# Patient Record
Sex: Female | Born: 1950 | Race: White | Hispanic: No | Marital: Married | Smoking: Former smoker
Health system: Southern US, Community
[De-identification: ages and names within clinical notes are randomized; demographics above are authoritative.]

## PROBLEM LIST (undated history)

## (undated) ENCOUNTER — Emergency Department: Admission: EM | Discharge status: Absent without leave | Payer: PRIVATE HEALTH INSURANCE

## (undated) DIAGNOSIS — M797 Fibromyalgia: Secondary | ICD-10-CM

## (undated) DIAGNOSIS — N183 Chronic kidney disease, stage 3 unspecified: Secondary | ICD-10-CM

## (undated) DIAGNOSIS — R161 Splenomegaly, not elsewhere classified: Secondary | ICD-10-CM

## (undated) DIAGNOSIS — I10 Essential (primary) hypertension: Secondary | ICD-10-CM

## (undated) DIAGNOSIS — G8929 Other chronic pain: Secondary | ICD-10-CM

## (undated) DIAGNOSIS — D869 Sarcoidosis, unspecified: Secondary | ICD-10-CM

## (undated) DIAGNOSIS — D696 Thrombocytopenia, unspecified: Secondary | ICD-10-CM

## (undated) DIAGNOSIS — F329 Major depressive disorder, single episode, unspecified: Secondary | ICD-10-CM

## (undated) DIAGNOSIS — M1990 Unspecified osteoarthritis, unspecified site: Secondary | ICD-10-CM

## (undated) DIAGNOSIS — M5020 Other cervical disc displacement, unspecified cervical region: Secondary | ICD-10-CM

## (undated) DIAGNOSIS — E119 Type 2 diabetes mellitus without complications: Secondary | ICD-10-CM

## (undated) DIAGNOSIS — M47812 Spondylosis without myelopathy or radiculopathy, cervical region: Secondary | ICD-10-CM

## (undated) DIAGNOSIS — K219 Gastro-esophageal reflux disease without esophagitis: Secondary | ICD-10-CM

## (undated) DIAGNOSIS — F32A Depression, unspecified: Secondary | ICD-10-CM

## (undated) DIAGNOSIS — K8020 Calculus of gallbladder without cholecystitis without obstruction: Secondary | ICD-10-CM

## (undated) DIAGNOSIS — G894 Chronic pain syndrome: Secondary | ICD-10-CM

## (undated) DIAGNOSIS — K7460 Unspecified cirrhosis of liver: Secondary | ICD-10-CM

## (undated) HISTORY — DX: Unspecified osteoarthritis, unspecified site: M19.90

## (undated) HISTORY — DX: Chronic kidney disease, stage 3 (moderate): N18.3

## (undated) HISTORY — PX: KNEE ARTHROSCOPY: SUR90

## (undated) HISTORY — PX: FRACTURE SURGERY: SHX138

## (undated) HISTORY — PX: OTHER SURGICAL HISTORY: SHX169

## (undated) HISTORY — PX: CYST EXCISION: SHX5701

## (undated) HISTORY — PX: BREAST SURGERY: SHX581

## (undated) HISTORY — DX: Other cervical disc displacement, unspecified cervical region: M50.20

## (undated) HISTORY — DX: Chronic kidney disease, stage 3 unspecified: N18.30

## (undated) HISTORY — DX: Splenomegaly, not elsewhere classified: R16.1

## (undated) HISTORY — PX: HAND SURGERY: SHX662

## (undated) HISTORY — DX: Spondylosis without myelopathy or radiculopathy, cervical region: M47.812

## (undated) HISTORY — DX: Thrombocytopenia, unspecified: D69.6

## (undated) HISTORY — DX: Fibromyalgia: M79.7

## (undated) HISTORY — DX: Unspecified cirrhosis of liver: K74.60

## (undated) HISTORY — DX: Calculus of gallbladder without cholecystitis without obstruction: K80.20

## (undated) HISTORY — PX: BACK SURGERY: SHX140

## (undated) HISTORY — PX: ABDOMINAL HYSTERECTOMY: SHX81

## (undated) HISTORY — DX: Chronic pain syndrome: G89.4

---

## 2004-01-30 ENCOUNTER — Other Ambulatory Visit: Payer: Self-pay

## 2011-08-25 DIAGNOSIS — E785 Hyperlipidemia, unspecified: Secondary | ICD-10-CM | POA: Insufficient documentation

## 2011-08-25 DIAGNOSIS — D869 Sarcoidosis, unspecified: Secondary | ICD-10-CM | POA: Insufficient documentation

## 2011-11-19 ENCOUNTER — Emergency Department: Payer: Self-pay | Admitting: Internal Medicine

## 2012-10-12 ENCOUNTER — Emergency Department: Payer: Self-pay | Admitting: Emergency Medicine

## 2013-02-17 DIAGNOSIS — M810 Age-related osteoporosis without current pathological fracture: Secondary | ICD-10-CM | POA: Insufficient documentation

## 2013-08-04 DIAGNOSIS — E559 Vitamin D deficiency, unspecified: Secondary | ICD-10-CM | POA: Insufficient documentation

## 2013-08-04 DIAGNOSIS — M797 Fibromyalgia: Secondary | ICD-10-CM | POA: Insufficient documentation

## 2013-08-28 DIAGNOSIS — G894 Chronic pain syndrome: Secondary | ICD-10-CM

## 2013-08-28 HISTORY — DX: Chronic pain syndrome: G89.4

## 2013-10-19 ENCOUNTER — Ambulatory Visit: Payer: Self-pay | Admitting: Pain Medicine

## 2013-10-19 LAB — BASIC METABOLIC PANEL
Anion Gap: 3 — ABNORMAL LOW (ref 7–16)
BUN: 18 mg/dL (ref 7–18)
Chloride: 99 mmol/L (ref 98–107)
Co2: 31 mmol/L (ref 21–32)
Creatinine: 1.27 mg/dL (ref 0.60–1.30)
EGFR (Non-African Amer.): 46 — ABNORMAL LOW
Osmolality: 272 (ref 275–301)

## 2013-10-19 LAB — SEDIMENTATION RATE: Erythrocyte Sed Rate: 34 mm/hr — ABNORMAL HIGH (ref 0–30)

## 2013-11-14 ENCOUNTER — Ambulatory Visit: Payer: Self-pay | Admitting: Pain Medicine

## 2013-11-14 ENCOUNTER — Other Ambulatory Visit: Payer: Self-pay | Admitting: Pain Medicine

## 2013-11-25 ENCOUNTER — Ambulatory Visit: Payer: Self-pay | Admitting: Pain Medicine

## 2013-12-13 ENCOUNTER — Ambulatory Visit: Payer: Self-pay | Admitting: Pain Medicine

## 2013-12-26 ENCOUNTER — Ambulatory Visit: Payer: Self-pay | Admitting: Pain Medicine

## 2014-03-31 ENCOUNTER — Ambulatory Visit: Payer: Self-pay | Admitting: Pain Medicine

## 2014-04-04 ENCOUNTER — Ambulatory Visit: Payer: Self-pay | Admitting: Pain Medicine

## 2014-05-17 ENCOUNTER — Ambulatory Visit: Payer: Self-pay | Admitting: Pain Medicine

## 2014-06-12 DIAGNOSIS — N281 Cyst of kidney, acquired: Secondary | ICD-10-CM | POA: Insufficient documentation

## 2014-07-11 DIAGNOSIS — K824 Cholesterolosis of gallbladder: Secondary | ICD-10-CM | POA: Insufficient documentation

## 2014-09-29 ENCOUNTER — Ambulatory Visit: Payer: Self-pay | Admitting: Pain Medicine

## 2015-06-20 DIAGNOSIS — I10 Essential (primary) hypertension: Secondary | ICD-10-CM | POA: Insufficient documentation

## 2015-06-20 DIAGNOSIS — M4322 Fusion of spine, cervical region: Secondary | ICD-10-CM | POA: Insufficient documentation

## 2015-07-31 DIAGNOSIS — F514 Sleep terrors [night terrors]: Secondary | ICD-10-CM | POA: Insufficient documentation

## 2015-07-31 DIAGNOSIS — R1013 Epigastric pain: Secondary | ICD-10-CM | POA: Insufficient documentation

## 2015-08-14 ENCOUNTER — Emergency Department: Payer: No Typology Code available for payment source

## 2015-08-14 ENCOUNTER — Emergency Department
Admission: EM | Admit: 2015-08-14 | Discharge: 2015-08-14 | Disposition: A | Discharge status: Home / Self Care | Payer: No Typology Code available for payment source | Attending: Emergency Medicine | Admitting: Emergency Medicine

## 2015-08-14 ENCOUNTER — Encounter: Payer: Self-pay | Admitting: Emergency Medicine

## 2015-08-14 DIAGNOSIS — R202 Paresthesia of skin: Secondary | ICD-10-CM | POA: Diagnosis not present

## 2015-08-14 DIAGNOSIS — Y92410 Unspecified street and highway as the place of occurrence of the external cause: Secondary | ICD-10-CM | POA: Insufficient documentation

## 2015-08-14 DIAGNOSIS — S134XXA Sprain of ligaments of cervical spine, initial encounter: Secondary | ICD-10-CM | POA: Diagnosis not present

## 2015-08-14 DIAGNOSIS — I10 Essential (primary) hypertension: Secondary | ICD-10-CM | POA: Diagnosis not present

## 2015-08-14 DIAGNOSIS — Y998 Other external cause status: Secondary | ICD-10-CM | POA: Insufficient documentation

## 2015-08-14 DIAGNOSIS — S139XXA Sprain of joints and ligaments of unspecified parts of neck, initial encounter: Secondary | ICD-10-CM

## 2015-08-14 DIAGNOSIS — Y9389 Activity, other specified: Secondary | ICD-10-CM | POA: Diagnosis not present

## 2015-08-14 DIAGNOSIS — S199XXA Unspecified injury of neck, initial encounter: Secondary | ICD-10-CM | POA: Diagnosis present

## 2015-08-14 DIAGNOSIS — E119 Type 2 diabetes mellitus without complications: Secondary | ICD-10-CM | POA: Diagnosis not present

## 2015-08-14 HISTORY — DX: Major depressive disorder, single episode, unspecified: F32.9

## 2015-08-14 HISTORY — DX: Essential (primary) hypertension: I10

## 2015-08-14 HISTORY — DX: Type 2 diabetes mellitus without complications: E11.9

## 2015-08-14 HISTORY — DX: Gastro-esophageal reflux disease without esophagitis: K21.9

## 2015-08-14 HISTORY — DX: Sarcoidosis, unspecified: D86.9

## 2015-08-14 HISTORY — DX: Depression, unspecified: F32.A

## 2015-08-14 HISTORY — DX: Other chronic pain: G89.29

## 2015-08-14 MED ORDER — HYDROCOD POLST-CPM POLST ER 10-8 MG/5ML PO SUER: 5.0000 mL | Freq: Two times a day (BID) | ORAL | Status: DC

## 2015-08-14 MED ORDER — DIAZEPAM 5 MG PO TABS
10.0000 mg | ORAL_TABLET | Freq: Once | ORAL | Status: AC
  Administered 2015-08-14: 10 mg via ORAL
  Filled 2015-08-14: qty 2

## 2015-08-14 MED ORDER — IBUPROFEN 800 MG PO TABS: 800.0000 mg | ORAL_TABLET | Freq: Three times a day (TID) | ORAL | Status: DC | PRN

## 2015-08-14 MED ORDER — DIAZEPAM 5 MG PO TABS: 5.0000 mg | ORAL_TABLET | Freq: Three times a day (TID) | ORAL | Status: DC | PRN

## 2015-08-14 NOTE — Discharge Instructions (Signed)
Cervical Sprain A cervical sprain is when the tissues (ligaments) that hold the neck bones in place stretch or tear. HOME CARE   Put ice on the injured area.  Put ice in a plastic bag.  Place a towel between your skin and the bag.  Leave the ice on for 15-20 minutes, 3-4 times a day.  You may have been given a collar to wear. This collar keeps your neck from moving while you heal.  Do not take the collar off unless told by your doctor.  If you have long hair, keep it outside of the collar.  Ask your doctor before changing the position of your collar. You may need to change its position over time to make it more comfortable.  If you are allowed to take off the collar for cleaning or bathing, follow your doctor's instructions on how to do it safely.  Keep your collar clean by wiping it with mild soap and water. Dry it completely. If the collar has removable pads, remove them every 1-2 days to hand wash them with soap and water. Allow them to air dry. They should be dry before you wear them in the collar.  Do not drive while wearing the collar.  Only take medicine as told by your doctor.  Keep all doctor visits as told.  Keep all physical therapy visits as told.  Adjust your work station so that you have good posture while you work.  Avoid positions and activities that make your problems worse.  Warm up and stretch before being active. GET HELP IF:  Your pain is not controlled with medicine.  You cannot take less pain medicine over time as planned.  Your activity level does not improve as expected. GET HELP RIGHT AWAY IF:   You are bleeding.  Your stomach is upset.  You have an allergic reaction to your medicine.  You develop new problems that you cannot explain.  You lose feeling (become numb) or you cannot move any part of your body (paralysis).  You have tingling or weakness in any part of your body.  Your symptoms get worse. Symptoms include:  Pain,  soreness, stiffness, puffiness (swelling), or a burning feeling in your neck.  Pain when your neck is touched.  Shoulder or upper back pain.  Limited ability to move your neck.  Headache.  Dizziness.  Your hands or arms feel week, lose feeling, or tingle.  Muscle spasms.  Difficulty swallowing or chewing. MAKE SURE YOU:   Understand these instructions.  Will watch your condition.  Will get help right away if you are not doing well or get worse. Document Released: 05/05/2008 Document Revised: 07/20/2013 Document Reviewed: 05/25/2013 Exit[employer] Patient Information 2015 [employer]. This information is not intended to replace advice given to you by your health care provider. Make sure you discuss any questions you have with your health care provider.  
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
## 2015-08-27 ENCOUNTER — Other Ambulatory Visit: Payer: Self-pay | Admitting: Neurosurgery

## 2015-08-27 ENCOUNTER — Ambulatory Visit
Admission: RE | Admit: 2015-08-27 | Discharge: 2015-08-27 | Disposition: A | Discharge status: Home / Self Care | Payer: No Typology Code available for payment source | Source: Ambulatory Visit | Attending: Neurosurgery | Admitting: Neurosurgery

## 2015-08-27 ENCOUNTER — Ambulatory Visit
Admission: RE | Admit: 2015-08-27 | Discharge: 2015-08-27 | Disposition: A | Discharge status: Home / Self Care | Payer: Self-pay | Source: Ambulatory Visit | Attending: Neurosurgery | Admitting: Neurosurgery

## 2015-08-27 DIAGNOSIS — M542 Cervicalgia: Secondary | ICD-10-CM

## 2015-08-27 DIAGNOSIS — Z981 Arthrodesis status: Secondary | ICD-10-CM | POA: Insufficient documentation

## 2015-08-27 DIAGNOSIS — M4312 Spondylolisthesis, cervical region: Secondary | ICD-10-CM | POA: Insufficient documentation

## 2015-09-04 ENCOUNTER — Ambulatory Visit: Payer: Medicare Other | Attending: Pain Medicine | Admitting: Pain Medicine

## 2015-09-04 ENCOUNTER — Encounter: Payer: Self-pay | Admitting: Pain Medicine

## 2015-09-04 VITALS — BP 106/71 | HR 83 | Temp 97.7°F | Resp 18 | Ht 63.0 in | Wt 210.0 lb

## 2015-09-04 DIAGNOSIS — M25519 Pain in unspecified shoulder: Secondary | ICD-10-CM

## 2015-09-04 DIAGNOSIS — M549 Dorsalgia, unspecified: Secondary | ICD-10-CM

## 2015-09-04 DIAGNOSIS — M542 Cervicalgia: Secondary | ICD-10-CM

## 2015-09-04 DIAGNOSIS — E785 Hyperlipidemia, unspecified: Secondary | ICD-10-CM | POA: Insufficient documentation

## 2015-09-04 DIAGNOSIS — E119 Type 2 diabetes mellitus without complications: Secondary | ICD-10-CM | POA: Diagnosis not present

## 2015-09-04 DIAGNOSIS — M479 Spondylosis, unspecified: Secondary | ICD-10-CM

## 2015-09-04 DIAGNOSIS — Z9181 History of falling: Secondary | ICD-10-CM

## 2015-09-04 DIAGNOSIS — Z87820 Personal history of traumatic brain injury: Secondary | ICD-10-CM

## 2015-09-04 DIAGNOSIS — M5020 Other cervical disc displacement, unspecified cervical region: Secondary | ICD-10-CM | POA: Insufficient documentation

## 2015-09-04 DIAGNOSIS — I129 Hypertensive chronic kidney disease with stage 1 through stage 4 chronic kidney disease, or unspecified chronic kidney disease: Secondary | ICD-10-CM | POA: Insufficient documentation

## 2015-09-04 DIAGNOSIS — Z981 Arthrodesis status: Secondary | ICD-10-CM | POA: Diagnosis not present

## 2015-09-04 DIAGNOSIS — M47816 Spondylosis without myelopathy or radiculopathy, lumbar region: Secondary | ICD-10-CM | POA: Diagnosis present

## 2015-09-04 DIAGNOSIS — M797 Fibromyalgia: Secondary | ICD-10-CM | POA: Diagnosis not present

## 2015-09-04 DIAGNOSIS — M47812 Spondylosis without myelopathy or radiculopathy, cervical region: Secondary | ICD-10-CM

## 2015-09-04 DIAGNOSIS — M545 Low back pain: Secondary | ICD-10-CM

## 2015-09-04 DIAGNOSIS — G8929 Other chronic pain: Secondary | ICD-10-CM

## 2015-09-04 DIAGNOSIS — F1120 Opioid dependence, uncomplicated: Secondary | ICD-10-CM

## 2015-09-04 DIAGNOSIS — G894 Chronic pain syndrome: Secondary | ICD-10-CM | POA: Diagnosis not present

## 2015-09-04 DIAGNOSIS — M5459 Other low back pain: Secondary | ICD-10-CM

## 2015-09-04 DIAGNOSIS — R2689 Other abnormalities of gait and mobility: Secondary | ICD-10-CM

## 2015-09-04 DIAGNOSIS — R51 Headache: Secondary | ICD-10-CM

## 2015-09-04 DIAGNOSIS — J449 Chronic obstructive pulmonary disease, unspecified: Secondary | ICD-10-CM | POA: Diagnosis not present

## 2015-09-04 DIAGNOSIS — F1190 Opioid use, unspecified, uncomplicated: Secondary | ICD-10-CM

## 2015-09-04 DIAGNOSIS — G4486 Cervicogenic headache: Secondary | ICD-10-CM

## 2015-09-04 DIAGNOSIS — Z79891 Long term (current) use of opiate analgesic: Secondary | ICD-10-CM

## 2015-09-04 MED ORDER — FENTANYL CITRATE (PF) 100 MCG/2ML IJ SOLN
INTRAMUSCULAR | Status: AC
  Administered 2015-09-04: 50 ug via INTRAVENOUS
  Filled 2015-09-04: qty 2

## 2015-09-04 MED ORDER — TRIAMCINOLONE ACETONIDE 40 MG/ML IJ SUSP
INTRAMUSCULAR | Status: AC
Start: 2015-09-04 — End: 2015-09-04
  Administered 2015-09-04: 09:00:00
  Filled 2015-09-04: qty 1

## 2015-09-04 MED ORDER — ROPIVACAINE HCL 2 MG/ML IJ SOLN
INTRAMUSCULAR | Status: AC
  Administered 2015-09-04: 09:00:00
  Filled 2015-09-04: qty 10

## 2015-09-04 MED ORDER — MIDAZOLAM HCL 5 MG/5ML IJ SOLN
INTRAMUSCULAR | Status: AC
  Administered 2015-09-04: 3 mg via INTRAVENOUS
  Filled 2015-09-04: qty 5

## 2015-09-04 NOTE — Progress Notes (Signed)
Safety precautions to be maintained throughout the outpatient stay will include: orient to surroundings, keep bed in low position, maintain call bell within reach at all times, provide assistance with transfer out of bed and ambulation.  

## 2015-09-04 NOTE — Patient Instructions (Signed)

## 2015-09-05 ENCOUNTER — Telehealth: Payer: Self-pay | Admitting: *Deleted

## 2015-09-05 DIAGNOSIS — R51 Headache: Secondary | ICD-10-CM

## 2015-09-05 DIAGNOSIS — E119 Type 2 diabetes mellitus without complications: Secondary | ICD-10-CM | POA: Insufficient documentation

## 2015-09-05 DIAGNOSIS — M064 Inflammatory polyarthropathy: Secondary | ICD-10-CM | POA: Insufficient documentation

## 2015-09-05 DIAGNOSIS — Z87820 Personal history of traumatic brain injury: Secondary | ICD-10-CM | POA: Insufficient documentation

## 2015-09-05 DIAGNOSIS — I110 Hypertensive heart disease with heart failure: Secondary | ICD-10-CM | POA: Insufficient documentation

## 2015-09-05 DIAGNOSIS — N183 Chronic kidney disease, stage 3 unspecified: Secondary | ICD-10-CM | POA: Insufficient documentation

## 2015-09-05 DIAGNOSIS — G4486 Cervicogenic headache: Secondary | ICD-10-CM | POA: Insufficient documentation

## 2015-09-05 DIAGNOSIS — J449 Chronic obstructive pulmonary disease, unspecified: Secondary | ICD-10-CM | POA: Insufficient documentation

## 2015-09-05 DIAGNOSIS — Z9181 History of falling: Secondary | ICD-10-CM | POA: Insufficient documentation

## 2015-09-05 DIAGNOSIS — Z79891 Long term (current) use of opiate analgesic: Secondary | ICD-10-CM | POA: Insufficient documentation

## 2015-09-05 DIAGNOSIS — F329 Major depressive disorder, single episode, unspecified: Secondary | ICD-10-CM | POA: Insufficient documentation

## 2015-09-05 DIAGNOSIS — M549 Dorsalgia, unspecified: Secondary | ICD-10-CM

## 2015-09-05 DIAGNOSIS — R2689 Other abnormalities of gait and mobility: Secondary | ICD-10-CM | POA: Insufficient documentation

## 2015-09-05 DIAGNOSIS — M6281 Muscle weakness (generalized): Secondary | ICD-10-CM | POA: Insufficient documentation

## 2015-09-05 DIAGNOSIS — D696 Thrombocytopenia, unspecified: Secondary | ICD-10-CM | POA: Insufficient documentation

## 2015-09-05 DIAGNOSIS — L409 Psoriasis, unspecified: Secondary | ICD-10-CM | POA: Insufficient documentation

## 2015-09-05 DIAGNOSIS — M542 Cervicalgia: Secondary | ICD-10-CM | POA: Insufficient documentation

## 2015-09-05 DIAGNOSIS — M47812 Spondylosis without myelopathy or radiculopathy, cervical region: Secondary | ICD-10-CM | POA: Insufficient documentation

## 2015-09-05 DIAGNOSIS — M5020 Other cervical disc displacement, unspecified cervical region: Secondary | ICD-10-CM

## 2015-09-05 DIAGNOSIS — G8929 Other chronic pain: Secondary | ICD-10-CM

## 2015-09-05 DIAGNOSIS — F1190 Opioid use, unspecified, uncomplicated: Secondary | ICD-10-CM | POA: Insufficient documentation

## 2015-09-05 DIAGNOSIS — F32A Depression, unspecified: Secondary | ICD-10-CM | POA: Insufficient documentation

## 2015-09-05 DIAGNOSIS — K219 Gastro-esophageal reflux disease without esophagitis: Secondary | ICD-10-CM | POA: Insufficient documentation

## 2015-09-05 HISTORY — DX: Other cervical disc displacement, unspecified cervical region: M50.20

## 2015-09-05 NOTE — Progress Notes (Signed)
Patient's Name: Amber Acosta MRN: [id number] DOB: December 17, 1950 DOS: 09/04/2015 Primary Care Physician: Sharyne Peach, MD Location: [facility] Outpatient Pain Management Facility Note by: Kathlen Brunswick Dossie Arbour, M.D, DABA, DABAPM, DABPM, DABIPP, FIPP  Procedure(s):  1. Medial Branch Block (see levels below) 2. Fluoroscopic Guidance for Needle Placement. 3. Moderate (Conscious) Sedation. Side to be done: Right-sided Level(s): L2, L3, L4, L5, and S1 Medial Branch Nerve(s). Diagnostic Indications: Lumbar spondylosis, posterior spinal axial pain with referred pain to the extremity, secondary to a Facet Syndrome.  Primary Reason(s) for Visit: Amber Acosta is coming in today for a diagnostic Lumbar Medial Branch Block (Facet joint Block). CC: Back Pain  Note:   HPI: Amber Acosta is a 64 y.o. year old,  female patient, seen today for interventional management of her pain. She has Cervical vertebral fusion; Back pain, chronic; Chronic pain associated with significant psychosocial dysfunction; Fibromyalgia; Pain in shoulder; Lumbar facet joint pain; Facet syndrome, lumbar; Chronic kidney disease; Chronic obstructive pulmonary disease ([place]); Clinical depression; Type 2 diabetes mellitus ([place]); Hypertensive heart disease with congestive heart failure ([place]); Displacement of cervical intervertebral disc without myelopathy; Essential (primary) hypertension; Gall bladder polyp; Acid reflux; HLD (hyperlipidemia); Inflammatory polyarthropathy ([place]); Sleep terror; Adiposity; Arthritis, degenerative; OP (osteoporosis); Psoriasis; Kidney cysts; Muscle weakness (generalized); Sarcoidosis ([place]); Thrombocytopenia ([place]); Avitaminosis D; Chronic low back pain; Chronic neck pain; Cervicogenic headache; Cervical facet syndrome; Chronic shoulder pain; At risk for falls; Balance problems; Chronic pain syndrome; History of closed head injury; Opiate use; Uncomplicated opioid dependence ([place]); and Long term current use of opiate analgesic on her  problem list..Amber Acosta is allergic to mushroom extract complex; darvon; and minocycline.. The patient complains primarily of Back Pain     Initial Vitals:  Today's Vitals   09/04/15 0927 09/04/15 0937 09/04/15 0947 09/04/15 0958  BP: 96/75 105/55 113/58 106/71  Pulse: 88 92 78 83  Temp:      TempSrc:      Resp: $Remo'18 18 18 18  'HQycx$ Height:      Weight:      SpO2: 97% 99% 98% 98%  PainSc:       Pre-procedure Assessment:  A medical history and physical exam were obtained. Relevant documentation was reviewed and verified. Prior to the procedure, the patient was provided with information on the procedure, including side-effects, and possible complications. Under the influence of no sedatives, a verbal, as well as a written informed consent were obtained, after having provided information on the risks and possible complications. To fulfill our ethical and legal obligations, as recommended by the American Medical Association's Code of Ethics, we have provided information to the patient about our clinical impression; the nature and purpose of an available treatment or procedure; the risks and benefits of an available treatment or procedure; alternatives; the risk and benefits of the alternative treatment or procedure; and the risks and benefits of not receiving or undergoing a treatment or procedure. The patient was provided information about the risks and possible complications associated with the procedure. These include, but not limited to, failure to achieve desired goals, infection, bleeding, organ or nerve damage, allergic reactions, paralysis, and death. In addition, the patient was informed that Medicine is not an exact science; therefore, there is also the possibility of unforeseen risks and possible complications that may result in a catastrophic outcome. The patient indicated having understood very clearly.  We have given the patient no guarantees and we have made no promises. Ample time was given to  the patient to ask questions, all  of which were answered, to the patient's satisfaction, before proceeding. The patient understands that by signing our informed consent form, they understand and accept the risks and the fact that it is impossible to predict all possible complications. Baseline vital signs were taken and the medical assessment was completed. Verification of the correct person, correct site (including marking of site), and correct procedure were performed and confirmed by the patient. Safety Measures:  Allergies were reviewed. Appropriate site, procedure, and patient were confirmed by following the Joint Commission's Universal Protocol (UP.01.01.01), in the form of a "Time Out". The patient was asked to confirm marked site and procedure, before commencing. The patient was asked about blood thinners, or active infections, both of which were denied. No attempt was made at seeking any paresthesias during needle insertion. Aspiration looking for blood return was conducted prior to injecting. At no point did we inject any substances, as a needle was being advanced. Infection Control:  Standard Universal Precautions taken (Respiratory Hygiene/Cough Etiquette; Mouth, nose, eye protection; Hand Hygiene; Personal protective equipment (PPE); safe injection practices; and use of masks and disposable sterile surgical gloves) as recommended by the Department of [place] for Disease Control and Prevention (CDC). Monitoring:  During the procedure, the patient was monitored in the usual manner, using NIBPM, ECG, and pulse oxymetry. IV Access:  An IV access was obtained and secured. Sedation:  Moderate (Conscious) Intravenous sedation: Consent was obtained before administering any sedation. Availability of a responsible, adult driver, and NPO status confirmed. Meaningful verbal contact was maintained, with the patient at all times during the procedure. ASA Sedation Guidelines followed. For  specifics on pharmacological type and quantity of sedation, please see nursing chart. Prophylactic Antibiotics: None: No indications for antibiotic prophylaxis were identified. Position: Prone.  Area prepped: Lumbosacral region. Target area: Posterolateral area of the vertebral body. Medial Branch Nerve. Procedure Needle(s) used: 22-G, 3.5-inch, Quincke spinal needles. Solution injected: Local anesthetic: 0.2% Ropivacaine (5ml) Steroid: Triamcinolone 40 mg/ml (30ml) Volume: 0.5 ml/Level Medications administered today:  We administered ropivacaine (PF) 2 mg/ml (0.2%), triamcinolone acetonide, fentaNYL, and midazolam. Skin Infiltration of Local Anesthetic:  Lidocaine 1%. The skin and deeper tissues over the procedure site were infiltrated using a 3 cc Luer-loc syringe with a 0.5 inch, 25-G needle. Skin Antiseptic Prep and "Time-out": The procedure site was prepped using a broad-spectrum topical antiseptic. The area was then draped in the usual and standard manner. "Time-out" was performed as per JC Universal Protocol (UP.01.01.01). Prepping solution: DuraPrep Surgical Solution (Iodine Povacrylex [0.7% available iodine] and Isopropyl Alcohol, 74% w/w) Description of the procedure: The procedure needle was introduced through the skin, ipsilateral to the reported pain, and advanced to the target area. Employing the "Medial Branch Technique", the procedure needles were introduced through the skin and deeper tissues until the anatomical area of the Medial Branch was reached. Site confirmation and injection: Once satisfied with needle placement, I proceeded to slowly inject the desired solution, in incremental fashion, with intermittent negative aspiration, repeatedly questioning the patient about "ringing in the ears; funny metallic taste in the mouth; or pain on injection", making sure not to inject intraneural, intravascular, or to provoke any paresthesias. Immediate Post-operative disposition: The  patient tolerated the entire procedure well. A repeat set of vitals were taken after the procedure and the patient was kept under observation until discharge criteria was met. The patient was provided with discharge instructions, including a section on how to identify potential problems. Should any problems arise concerning this procedure, the  patient was given instructions to immediately contact us, at any time, without hesitation. In any case, we plan to contact the patient by telephone for a follow-up status report regarding this interventional procedure. Radiological Imaging: Fluoroscopic Guidance for Needle Placement:  I was personally present in the fluoroscopy suite during the entire procedure. The patient was positioned over the fluoroscopy table. The Fluoroscope was manipulated to obtain the best possible view of the target area. Parallax error was corrected before commencing the procedure. Once the target was reached, antero-posterior and lateral fluoroscopic views were taken to confirm needle placement in two planes. Fluoroscopy time: Please see the patient's chart for exact times on active fluoroscopy. Complications:  No heme, no CSF, no paresthesias. Discharge disposition:  Return to clinics in 2 weeks for post-procedure evaluation. Additional Comments/Plan:  None. Disclaimer: Medicine is not an Chief Strategy Officer. The only guarantee in medicine is that nothing is guaranteed. It is important to note that the decision to proceed with this intervention was based on the information collected from the patient. The Data and conclusions were drawn from the patient's questionnaire, the interview, and the physical examination. Because the information was provided in large part by the patient, it cannot be guaranteed that it has not been purposely or unconsciously manipulated. Every effort has been made to obtain as much relevant data as possible for this evaluation. It is important to note that the  conclusions that lead to this procedure are derived in large part from the available data. Always take into account that the treatment will also be dependent on availability of resources and existing treatment guidelines, considered by other Pain Management Practitioners as being common knowledge and practice, at this time. For Medico-Legal purposes, it is also important to point out that variations in procedural techniques and pharmacological choices are the acceptable norm. The indications, contraindications, technique, and results of the above procedure should only be interpreted and judged by a Board-Certified Interventional Pain Specialist with extensive familiarity and expertise in the same exact procedure and technique, doing otherwise would be inappropriate and unethical.

## 2015-09-05 NOTE — Telephone Encounter (Signed)
Verbalizes no complications

## 2015-09-21 ENCOUNTER — Encounter: Payer: Self-pay | Admitting: Pain Medicine

## 2015-09-21 ENCOUNTER — Ambulatory Visit: Payer: Medicare Other | Attending: Pain Medicine | Admitting: Pain Medicine

## 2015-09-21 VITALS — BP 164/67 | HR 71 | Resp 16 | Ht 63.5 in | Wt 217.0 lb

## 2015-09-21 DIAGNOSIS — E119 Type 2 diabetes mellitus without complications: Secondary | ICD-10-CM | POA: Insufficient documentation

## 2015-09-21 DIAGNOSIS — I129 Hypertensive chronic kidney disease with stage 1 through stage 4 chronic kidney disease, or unspecified chronic kidney disease: Secondary | ICD-10-CM | POA: Diagnosis not present

## 2015-09-21 DIAGNOSIS — G894 Chronic pain syndrome: Secondary | ICD-10-CM | POA: Insufficient documentation

## 2015-09-21 DIAGNOSIS — M47892 Other spondylosis, cervical region: Secondary | ICD-10-CM | POA: Diagnosis not present

## 2015-09-21 DIAGNOSIS — M4312 Spondylolisthesis, cervical region: Secondary | ICD-10-CM | POA: Insufficient documentation

## 2015-09-21 DIAGNOSIS — K219 Gastro-esophageal reflux disease without esophagitis: Secondary | ICD-10-CM | POA: Diagnosis not present

## 2015-09-21 DIAGNOSIS — J449 Chronic obstructive pulmonary disease, unspecified: Secondary | ICD-10-CM | POA: Diagnosis not present

## 2015-09-21 DIAGNOSIS — M797 Fibromyalgia: Secondary | ICD-10-CM | POA: Diagnosis not present

## 2015-09-21 DIAGNOSIS — Z5181 Encounter for therapeutic drug level monitoring: Secondary | ICD-10-CM | POA: Insufficient documentation

## 2015-09-21 DIAGNOSIS — F1990 Other psychoactive substance use, unspecified, uncomplicated: Secondary | ICD-10-CM

## 2015-09-21 DIAGNOSIS — M791 Myalgia: Secondary | ICD-10-CM

## 2015-09-21 DIAGNOSIS — F1120 Opioid dependence, uncomplicated: Secondary | ICD-10-CM

## 2015-09-21 DIAGNOSIS — Z79891 Long term (current) use of opiate analgesic: Secondary | ICD-10-CM | POA: Insufficient documentation

## 2015-09-21 DIAGNOSIS — I509 Heart failure, unspecified: Secondary | ICD-10-CM | POA: Insufficient documentation

## 2015-09-21 DIAGNOSIS — F1190 Opioid use, unspecified, uncomplicated: Secondary | ICD-10-CM | POA: Diagnosis not present

## 2015-09-21 DIAGNOSIS — R790 Abnormal level of blood mineral: Secondary | ICD-10-CM

## 2015-09-21 DIAGNOSIS — M47812 Spondylosis without myelopathy or radiculopathy, cervical region: Secondary | ICD-10-CM

## 2015-09-21 DIAGNOSIS — Z9889 Other specified postprocedural states: Secondary | ICD-10-CM | POA: Insufficient documentation

## 2015-09-21 DIAGNOSIS — E785 Hyperlipidemia, unspecified: Secondary | ICD-10-CM | POA: Insufficient documentation

## 2015-09-21 DIAGNOSIS — D869 Sarcoidosis, unspecified: Secondary | ICD-10-CM | POA: Diagnosis not present

## 2015-09-21 DIAGNOSIS — G4486 Cervicogenic headache: Secondary | ICD-10-CM

## 2015-09-21 DIAGNOSIS — M452 Ankylosing spondylitis of cervical region: Secondary | ICD-10-CM

## 2015-09-21 DIAGNOSIS — R51 Headache: Secondary | ICD-10-CM

## 2015-09-21 DIAGNOSIS — M4322 Fusion of spine, cervical region: Secondary | ICD-10-CM

## 2015-09-21 DIAGNOSIS — Z79899 Other long term (current) drug therapy: Secondary | ICD-10-CM

## 2015-09-21 DIAGNOSIS — M7918 Myalgia, other site: Secondary | ICD-10-CM | POA: Insufficient documentation

## 2015-09-21 DIAGNOSIS — M549 Dorsalgia, unspecified: Secondary | ICD-10-CM | POA: Diagnosis present

## 2015-09-21 HISTORY — DX: Spondylosis without myelopathy or radiculopathy, cervical region: M47.812

## 2015-09-21 MED ORDER — OXYCODONE HCL 5 MG PO TABS: 5.0000 mg | ORAL_TABLET | Freq: Three times a day (TID) | ORAL | Status: DC | PRN

## 2015-09-21 MED ORDER — PREGABALIN 150 MG PO CAPS: 150.0000 mg | ORAL_CAPSULE | Freq: Three times a day (TID) | ORAL | Status: DC

## 2015-09-21 MED ORDER — TIZANIDINE HCL 4 MG PO TABS: 4.0000 mg | ORAL_TABLET | Freq: Two times a day (BID) | ORAL | Status: DC | PRN

## 2015-09-21 MED ORDER — MAGNESIUM OXIDE 400 MG PO TABS: 400.0000 mg | ORAL_TABLET | Freq: Every day | ORAL | Status: DC
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
## 2015-10-18 ENCOUNTER — Other Ambulatory Visit: Payer: Self-pay | Admitting: Pain Medicine

## 2015-12-19 ENCOUNTER — Encounter: Payer: Self-pay | Admitting: Pain Medicine

## 2015-12-19 ENCOUNTER — Other Ambulatory Visit: Payer: Self-pay | Admitting: Pain Medicine

## 2015-12-19 ENCOUNTER — Ambulatory Visit: Payer: Medicare Other | Attending: Pain Medicine | Admitting: Pain Medicine

## 2015-12-19 VITALS — BP 134/88 | HR 75 | Temp 98.5°F | Resp 18 | Ht 63.0 in | Wt 223.0 lb

## 2015-12-19 DIAGNOSIS — D869 Sarcoidosis, unspecified: Secondary | ICD-10-CM | POA: Insufficient documentation

## 2015-12-19 DIAGNOSIS — E119 Type 2 diabetes mellitus without complications: Secondary | ICD-10-CM | POA: Diagnosis not present

## 2015-12-19 DIAGNOSIS — M549 Dorsalgia, unspecified: Secondary | ICD-10-CM | POA: Diagnosis present

## 2015-12-19 DIAGNOSIS — R790 Abnormal level of blood mineral: Secondary | ICD-10-CM

## 2015-12-19 DIAGNOSIS — Z5181 Encounter for therapeutic drug level monitoring: Secondary | ICD-10-CM | POA: Diagnosis not present

## 2015-12-19 DIAGNOSIS — M545 Low back pain, unspecified: Secondary | ICD-10-CM

## 2015-12-19 DIAGNOSIS — Z79891 Long term (current) use of opiate analgesic: Secondary | ICD-10-CM | POA: Diagnosis not present

## 2015-12-19 DIAGNOSIS — F1190 Opioid use, unspecified, uncomplicated: Secondary | ICD-10-CM | POA: Insufficient documentation

## 2015-12-19 DIAGNOSIS — L409 Psoriasis, unspecified: Secondary | ICD-10-CM | POA: Insufficient documentation

## 2015-12-19 DIAGNOSIS — E669 Obesity, unspecified: Secondary | ICD-10-CM | POA: Insufficient documentation

## 2015-12-19 DIAGNOSIS — G8929 Other chronic pain: Secondary | ICD-10-CM

## 2015-12-19 DIAGNOSIS — K824 Cholesterolosis of gallbladder: Secondary | ICD-10-CM | POA: Insufficient documentation

## 2015-12-19 DIAGNOSIS — I110 Hypertensive heart disease with heart failure: Secondary | ICD-10-CM | POA: Insufficient documentation

## 2015-12-19 DIAGNOSIS — G894 Chronic pain syndrome: Secondary | ICD-10-CM

## 2015-12-19 DIAGNOSIS — K219 Gastro-esophageal reflux disease without esophagitis: Secondary | ICD-10-CM | POA: Diagnosis not present

## 2015-12-19 DIAGNOSIS — M797 Fibromyalgia: Secondary | ICD-10-CM | POA: Diagnosis not present

## 2015-12-19 DIAGNOSIS — M810 Age-related osteoporosis without current pathological fracture: Secondary | ICD-10-CM | POA: Diagnosis not present

## 2015-12-19 DIAGNOSIS — E785 Hyperlipidemia, unspecified: Secondary | ICD-10-CM | POA: Diagnosis not present

## 2015-12-19 DIAGNOSIS — M4312 Spondylolisthesis, cervical region: Secondary | ICD-10-CM | POA: Insufficient documentation

## 2015-12-19 DIAGNOSIS — I129 Hypertensive chronic kidney disease with stage 1 through stage 4 chronic kidney disease, or unspecified chronic kidney disease: Secondary | ICD-10-CM | POA: Insufficient documentation

## 2015-12-19 DIAGNOSIS — M25559 Pain in unspecified hip: Secondary | ICD-10-CM | POA: Diagnosis present

## 2015-12-19 DIAGNOSIS — J449 Chronic obstructive pulmonary disease, unspecified: Secondary | ICD-10-CM | POA: Diagnosis not present

## 2015-12-19 DIAGNOSIS — M791 Myalgia: Secondary | ICD-10-CM

## 2015-12-19 DIAGNOSIS — M7918 Myalgia, other site: Secondary | ICD-10-CM

## 2015-12-19 DIAGNOSIS — M47816 Spondylosis without myelopathy or radiculopathy, lumbar region: Secondary | ICD-10-CM

## 2015-12-19 MED ORDER — OXYCODONE HCL 5 MG PO TABS: 5.0000 mg | ORAL_TABLET | Freq: Three times a day (TID) | ORAL | Status: DC | PRN

## 2015-12-19 MED ORDER — PREGABALIN 150 MG PO CAPS: 150.0000 mg | ORAL_CAPSULE | Freq: Three times a day (TID) | ORAL | Status: DC

## 2015-12-19 MED ORDER — MAGNESIUM OXIDE 400 MG PO TABS: 400.0000 mg | ORAL_TABLET | Freq: Every day | ORAL | Status: DC

## 2015-12-19 MED ORDER — TIZANIDINE HCL 4 MG PO TABS: 4.0000 mg | ORAL_TABLET | Freq: Two times a day (BID) | ORAL | Status: DC | PRN

## 2015-12-19 NOTE — Progress Notes (Signed)
Safety precautions to be maintained throughout the outpatient stay will include: orient to surroundings, keep bed in low position, maintain call bell within reach at all times, provide assistance with transfer out of bed and ambulation.  

## 2015-12-19 NOTE — Progress Notes (Signed)
Patient's Name: Amber Acosta MRN: [id number] DOB: Sep 16, 1951 DOS: 12/19/2015  Primary Reason(s) for Visit: Encounter for Medication Management CC: Back Pain and Hip Pain   HPI:  Amber Acosta is a 65 y.o. year old, female patient, who returns today as an established patient. 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); Displacement of cervical intervertebral disc without myelopathy; Essential (primary) hypertension; Gall bladder polyp; Acid reflux; HLD (hyperlipidemia); Inflammatory polyarthropathy (HCC); Sleep terror; Adiposity; Arthritis, degenerative; OP (osteoporosis); Psoriasis; Kidney cysts; Muscle weakness (generalized); Sarcoidosis (HCC); Thrombocytopenia (HCC); Avitaminosis D; Chronic low back pain (Location of Primary Source of Pain) (Bilateral) (R>L); Chronic neck pain; Cervicogenic headache; Cervical facet syndrome; Chronic shoulder pain; At risk for falls; Balance problems; Chronic pain syndrome; History of closed head injury; Opiate use; Uncomplicated opioid dependence (HCC); Long term current use of opiate analgesic; Encounter for therapeutic drug level monitoring; Long term prescription opiate use; Spondylolisthesis of cervical region; Cervical spondylosis without myelopathy; 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HCC); Hx of cervical spine surgery; Low magnesium levels; Myofascial pain; and Chronic pain on her problem list.. Her primarily concern today is the Back Pain and Hip Pain   The patient returns to the clinics today for pharmacological management of her chronic pain. At this point she is having recurrence of her low back pain. In the past we have  done diagnostic lumbar facet blocks with excellent results. We have currently done several of them and therefore today I have offered lumbar facet RFA. She is interested in knowing more about this. I have also offered her to have a standing order for lumbar facet blocks that she can activate whenever she has a flareup. With regards to her medications, she seems to be doing well. She also needs to work on her weight to bring her BMI below 30.  Reported Pain Score: 6 , clinically she looks like a 2-3/10. Reported level is inconsistent with clinical obrservations. Pain Type: Chronic pain Pain Descriptors / Indicators: Constant, Aching, Sharp Pain Frequency: Constant  Date of Last Visit: 09/21/15 Service Provided on Last Visit: Med Refill  Pharmacotherapy  Review:   Onset of action: Within expected pharmacological parameters Time to Peak effect: Timing and results are as within normal expected parameters Effectiveness: Described as relatively effective, allowing for increase in activities of daily living (ADL) % Relief: More than 50% Side-effects or Adverse reactions: None reported Duration of action: Within normal limits for medication [place] PMP: Compliant with practice rules and regulations UDS Results: The patient's last UDS on 09/21/2015 was within normal limits with no unexpected results. UDS Interpretation: Patient appears to be compliant with practice rules and regulations Medication Assessment Form: Reviewed. Patient indicates being compliant with therapy Treatment compliance: Compliant Substance Use Disorder (SUD) Risk Level: Low Pharmacologic Plan: Continue therapy as is  Lab Work: Illicit Drugs No results found for: THCU, COCAINSCRNUR, PCPSCRNUR, MDMA, AMPHETMU, METHADONE, ETOH  Inflammation Markers Lab Results  Component Value Date   ESRSEDRATE 34* 10/19/2013    Renal Function Lab Results  Component Value Date   BUN 18 10/19/2013   CREATININE 1.27 10/19/2013   GFRAA 53*  10/19/2013   GFRNONAA 46* 10/19/2013    Hepatic Function No results found for: AST, ALT, ALBUMIN  Electrolytes Lab Results  Component Value Date  NA 133* 10/19/2013   K 4.0 10/19/2013   CL 99 10/19/2013   CALCIUM 8.9 10/19/2013   MG 1.6* 10/19/2013    Allergies:  Amber Acosta is allergic to mushroom extract complex; mtx support; darvon; and minocycline.  Meds:  The patient has a current medication list which includes the following prescription(s): amlodipine, diclofenac sodium, ferrous sulfate, fluoxetine, furosemide, insulin glargine, lovastatin, magnesium oxide, metformin, metoprolol, oxycodone, oxycodone, oxycodone, potassium chloride sa, pregabalin, tizanidine, and trazodone.  ROS:  Constitutional: Afebrile, no chills, well hydrated and well nourished Gastrointestinal: negative Musculoskeletal:negative Neurological: negative Behavioral/Psych: negative  PFSH:  Medical:  Amber Acosta  has a past medical history of Hypertension; Diabetes mellitus without complication (HCC); Sarcoid (HCC); Chronic pain; Depression; GERD (gastroesophageal reflux disease); Thrombopenia (HCC); Fibromyalgia; Arthritis; and Kidney disease, chronic, stage III (GFR 30-59 ml/min). Family: family history includes Alcohol abuse in her brother; Alzheimer's disease in her mother; COPD in her father; Diabetes in her father; Heart disease in her father. Surgical:  has past surgical history that includes neck fusion; Breast surgery; Back surgery; Knee arthroscopy; Fracture surgery; Hand surgery; and Abdominal hysterectomy. Tobacco:  reports that she has never smoked. She does not have any smokeless tobacco history on file. Alcohol:  reports that she does not drink alcohol. Drug:  reports that she does not use illicit drugs.  Physical Exam:  Vitals:  Today's Vitals   12/19/15 0804 12/19/15 0806  BP: 134/88   Pulse: 75   Temp: 98.5 F (36.9 C)   TempSrc: Oral   Resp: 18   Height:  (1.6 m)   Weight:  223 lb (101.152 kg)   SpO2: 100%   PainSc:  6     Calculated BMI: Body mass index is 39.51 kg/(m^2).  General appearance: alert, cooperative, appears stated age, no distress and morbidly obese Eyes: PERLA Respiratory: No evidence respiratory distress, no audible rales or ronchi and no use of accessory muscles of respiration  Cervical Spine Inspection: Normal anatomy Alignment: Symetrical Palpation: WNL ROM: Adequate  Upper Extremities Inspection: No gross anomalies detected ROM: Adequate Sensory: Normal Motor: Unremarkable Pulses: Palpable  Thoracic Spine Inspection: No gross anomalies detected Alignment: Symetrical Palpation: WNL ROM: Adequate  Lumbar Spine Inspection: No gross anomalies detected Alignment: Symetrical Palpation: WNL ROM: Adequate Provocative Tests: Lumbar Hyperextension and rotation test: Positive bilaterally. Patrick's Maneuver: deferred Gait: Antalgic (limping)  Lower Extremities Inspection: No gross anomalies detected ROM: Adequate Sensory: Normal Motor: Unremarkable  Assessment & Plan:  Primary Diagnosis & Pertinent Problem List: The primary encounter diagnosis was Chronic pain. Diagnoses of Chronic low back pain, Lumbar facet syndrome (Bilateral) (R>L), Long term current use of opiate analgesic, Encounter for therapeutic drug level monitoring, Chronic pain syndrome, Fibromyalgia, Myofascial pain, and Low magnesium levels were also pertinent to this visit.  Assessment: No problem-specific assessment & plan notes found for this encounter.   Pharmacotherapy Orders: Meds ordered this encounter  Medications  . oxyCODONE (OXY IR/ROXICODONE) 5 MG immediate release tablet    Sig: Take 1 tablet (5 mg total) by mouth every 8 (eight) hours as needed for moderate pain or severe pain.    Dispense:  90 tablet    Refill:  0    Do not place this medication, or any other prescription from our practice, on "Automatic Refill". Patient may have  prescription filled one day early if pharmacy is closed on scheduled refill date. Do not fill until: 01/05/16 To last until: 02/01/16  . oxyCODONE (OXY IR/ROXICODONE) 5 MG immediate release tablet  Sig: Take 1 tablet (5 mg total) by mouth every 8 (eight) hours as needed for moderate pain or severe pain.    Dispense:  90 tablet    Refill:  0    Do not place this medication, or any other prescription from our practice, on "Automatic Refill". Patient may have prescription filled one day early if pharmacy is closed on scheduled refill date. Do not fill until: 02/01/16 To last until: 03/02/16  . oxyCODONE (OXY IR/ROXICODONE) 5 MG immediate release tablet    Sig: Take 1 tablet (5 mg total) by mouth every 8 (eight) hours as needed for moderate pain or severe pain.    Dispense:  90 tablet    Refill:  0    Do not place this medication, or any other prescription from our practice, on "Automatic Refill". Patient may have prescription filled one day early if pharmacy is closed on scheduled refill date. Do not fill until: 03/02/16 To last until: 03/31/16  . pregabalin (LYRICA) 150 MG capsule    Sig: Take 1 capsule (150 mg total) by mouth 3 (three) times daily.    Dispense:  90 capsule    Refill:  2    Do not place this medication, or any other prescription from our practice, on "Automatic Refill". Patient may have prescription filled one day early if pharmacy is closed on scheduled refill date.  Marland Kitchen tiZANidine (ZANAFLEX) 4 MG tablet    Sig: Take 1 tablet (4 mg total) by mouth 2 (two) times daily as needed for muscle spasms.    Dispense:  60 tablet    Refill:  2    Do not place this medication, or any other prescription from our practice, on "Automatic Refill". Patient may have prescription filled one day early if pharmacy is closed on scheduled refill date.  . magnesium oxide (MAG-OX) 400 MG tablet    Sig: Take 1 tablet (400 mg total) by mouth daily.    Dispense:  30 tablet    Refill:  2    Do not  place this medication, or any other prescription from our practice, on "Automatic Refill". Patient may have prescription filled one day early if pharmacy is closed on scheduled refill date.    Lab-work & Procedure Orders: Orders Placed This Encounter  Procedures  . LUMBAR FACET(MEDIAL BRANCH NERVE BLOCK) MBNB  . RADIOFREQUENCY, CERVICAL THORACIC LUMBER, GENICULAR     Radiology Orders: None  Interventional Therapies:  1. PRN procedure: Bilateral lumbar facet block under fluoroscopic guidance and IV sedation.  2. Scheduled procedure: Right sided lumbar facet radiofrequency ablation under fluoroscopic guidance and IV sedation. We'll start on the right side because this is her worst side, but later will need to do the left side.    Administered Medications: Ms. Firmin had no medications administered during this visit.  Primary Care Physician: Rayetta Humphrey, MD Location: [facility] Pain Management Facility Note by: Sydnee Levans Laban Emperor, M.D, DABA, DABAPM, DABPM, DABIPP, FIPP

## 2015-12-19 NOTE — Progress Notes (Signed)
#  13 remaining Oxycodone 5 mg

## 2015-12-19 NOTE — Patient Instructions (Addendum)
GENERAL RISKS AND COMPLICATIONS  What are the risk, side effects and possible complications? Generally speaking, most procedures are safe.  However, with any procedure there are risks, side effects, and the possibility of complications.  The risks and complications are dependent upon the sites that are lesioned, or the type of nerve block to be performed.  The closer the procedure is to the spine, the more serious the risks are.  Great care is taken when placing the radio frequency needles, block needles or lesioning probes, but sometimes complications can occur. 1. Infection: Any time there is an injection through the skin, there is a risk of infection.  This is why sterile conditions are used for these blocks.  There are four possible types of infection. 1. Localized skin infection. 2. Central Nervous System Infection-This can be in the form of Meningitis, which can be deadly. 3. Epidural Infections-This can be in the form of an epidural abscess, which can cause pressure inside of the spine, causing compression of the spinal cord with subsequent paralysis. This would require an emergency surgery to decompress, and there are no guarantees that the patient would recover from the paralysis. 4. Discitis-This is an infection of the intervertebral discs.  It occurs in about 1% of discography procedures.  It is difficult to treat and it may lead to surgery.        2. Pain: the needles have to go through skin and soft tissues, will cause soreness.       3. Damage to internal structures:  The nerves to be lesioned may be near blood vessels or    other nerves which can be potentially damaged.       4. Bleeding: Bleeding is more common if the patient is taking blood thinners such as  aspirin, Coumadin, Ticiid, Plavix, etc., or if he/she have some genetic predisposition  such as hemophilia. Bleeding into the spinal canal can cause compression of the spinal  cord with subsequent paralysis.  This would require an  emergency surgery to  decompress and there are no guarantees that the patient would recover from the  paralysis.       5. Pneumothorax:  Puncturing of a lung is a possibility, every time a needle is introduced in  the area of the chest or upper back.  Pneumothorax refers to free air around the  collapsed lung(s), inside of the thoracic cavity (chest cavity).  Another two possible  complications related to a similar event would include: Hemothorax and Chylothorax.   These are variations of the Pneumothorax, where instead of air around the collapsed  lung(s), you may have blood or chyle, respectively.       6. Spinal headaches: They may occur with any procedures in the area of the spine.       7. Persistent CSF (Cerebro-Spinal Fluid) leakage: This is a rare problem, but may occur  with prolonged intrathecal or epidural catheters either due to the formation of a fistulous  track or a dural tear.       8. Nerve damage: By working so close to the spinal cord, there is always a possibility of  nerve damage, which could be as serious as a permanent spinal cord injury with  paralysis.       9. Death:  Although rare, severe deadly allergic reactions known as "Anaphylactic  reaction" can occur to any of the medications used.      10. Worsening of the symptoms:  We can always make thing worse.    What are the chances of something like this happening? Chances of any of this occuring are extremely low.  By statistics, you have more of a chance of getting killed in a motor vehicle accident: while driving to the hospital than any of the above occurring .  Nevertheless, you should be aware that they are possibilities.  In general, it is similar to taking a shower.  Everybody knows that you can slip, hit your head and get killed.  Does that mean that you should not shower again?  Nevertheless always keep in mind that statistics do not mean anything if you happen to be on the wrong side of them.  Even if a procedure has a 1  (one) in a 1,000,000 (million) chance of going wrong, it you happen to be that one..Also, keep in mind that by statistics, you have more of a chance of having something go wrong when taking medications.  Who should not have this procedure? If you are on a blood thinning medication (e.g. Coumadin, Plavix, see list of "Blood Thinners"), or if you have an active infection going on, you should not have the procedure.  If you are taking any blood thinners, please inform your physician.  How should I prepare for this procedure?  Do not eat or drink anything at least six hours prior to the procedure.  Bring a driver with you .  It cannot be a taxi.  Come accompanied by an adult that can drive you back, and that is strong enough to help you if your legs get weak or numb from the local anesthetic.  Take all of your medicines the morning of the procedure with just enough water to swallow them.  If you have diabetes, make sure that you are scheduled to have your procedure done first thing in the morning, whenever possible.  If you have diabetes, take only half of your insulin dose and notify our nurse that you have done so as soon as you arrive at the clinic.  If you are diabetic, but only take blood sugar pills (oral hypoglycemic), then do not take them on the morning of your procedure.  You may take them after you have had the procedure.  Do not take aspirin or any aspirin-containing medications, at least eleven (11) days prior to the procedure.  They may prolong bleeding.  Wear loose fitting clothing that may be easy to take off and that you would not mind if it got stained with Betadine or blood.  Do not wear any jewelry or perfume  Remove any nail coloring.  It will interfere with some of our monitoring equipment.  NOTE: Remember that this is not meant to be interpreted as a complete list of all possible complications.  Unforeseen problems may occur.  BLOOD THINNERS The following drugs  contain aspirin or other products, which can cause increased bleeding during surgery and should not be taken for 2 weeks prior to and 1 week after surgery.  If you should need take something for relief of minor pain, you may take acetaminophen which is found in Tylenol,m Datril, Anacin-3 and Panadol. It is not blood thinner. The products listed below are.  Do not take any of the products listed below in addition to any listed on your instruction sheet.  A.P.C or A.P.C with Codeine Codeine Phosphate Capsules #3 Ibuprofen Ridaura  ABC compound Congesprin Imuran rimadil  Advil Cope Indocin Robaxisal  Alka-Seltzer Effervescent Pain Reliever and Antacid Coricidin or Coricidin-D  Indomethacin Rufen    Alka-Seltzer plus Cold Medicine Cosprin Ketoprofen S-A-C Tablets  Anacin Analgesic Tablets or Capsules Coumadin Korlgesic Salflex  Anacin Extra Strength Analgesic tablets or capsules CP-2 Tablets Lanoril Salicylate  Anaprox Cuprimine Capsules Levenox Salocol  Anexsia-D Dalteparin Magan Salsalate  Anodynos Darvon compound Magnesium Salicylate Sine-off  Ansaid Dasin Capsules Magsal Sodium Salicylate  Anturane Depen Capsules Marnal Soma  APF Arthritis pain formula Dewitt's Pills Measurin Stanback  Argesic Dia-Gesic Meclofenamic Sulfinpyrazone  Arthritis Bayer Timed Release Aspirin Diclofenac Meclomen Sulindac  Arthritis pain formula Anacin Dicumarol Medipren Supac  Analgesic (Safety coated) Arthralgen Diffunasal Mefanamic Suprofen  Arthritis Strength Bufferin Dihydrocodeine Mepro Compound Suprol  Arthropan liquid Dopirydamole Methcarbomol with Aspirin Synalgos  ASA tablets/Enseals Disalcid Micrainin Tagament  Ascriptin Doan's Midol Talwin  Ascriptin A/D Dolene Mobidin Tanderil  Ascriptin Extra Strength Dolobid Moblgesic Ticlid  Ascriptin with Codeine Doloprin or Doloprin with Codeine Momentum Tolectin  Asperbuf Duoprin Mono-gesic Trendar  Aspergum Duradyne Motrin or Motrin IB Triminicin  Aspirin  plain, buffered or enteric coated Durasal Myochrisine Trigesic  Aspirin Suppositories Easprin Nalfon Trillsate  Aspirin with Codeine Ecotrin Regular or Extra Strength Naprosyn Uracel  Atromid-S Efficin Naproxen Ursinus  Auranofin Capsules Elmiron Neocylate Vanquish  Axotal Emagrin Norgesic Verin  Azathioprine Empirin or Empirin with Codeine Normiflo Vitamin E  Azolid Emprazil Nuprin Voltaren  Bayer Aspirin plain, buffered or children's or timed BC Tablets or powders Encaprin Orgaran Warfarin Sodium  Buff-a-Comp Enoxaparin Orudis Zorpin  Buff-a-Comp with Codeine Equegesic Os-Cal-Gesic   Buffaprin Excedrin plain, buffered or Extra Strength Oxalid   Bufferin Arthritis Strength Feldene Oxphenbutazone   Bufferin plain or Extra Strength Feldene Capsules Oxycodone with Aspirin   Bufferin with Codeine Fenoprofen Fenoprofen Pabalate or Pabalate-SF   Buffets II Flogesic Panagesic   Buffinol plain or Extra Strength Florinal or Florinal with Codeine Panwarfarin   Buf-Tabs Flurbiprofen Penicillamine   Butalbital Compound Four-way cold tablets Penicillin   Butazolidin Fragmin Pepto-Bismol   Carbenicillin Geminisyn Percodan   Carna Arthritis Reliever Geopen Persantine   Carprofen Gold's salt Persistin   Chloramphenicol Goody's Phenylbutazone   Chloromycetin Haltrain Piroxlcam   Clmetidine heparin Plaquenil   Cllnoril Hyco-pap Ponstel   Clofibrate Hydroxy chloroquine Propoxyphen         Before stopping any of these medications, be sure to consult the physician who ordered them.  Some, such as Coumadin (Warfarin) are ordered to prevent or treat serious conditions such as "deep thrombosis", "pumonary embolisms", and other heart problems.  The amount of time that you may need off of the medication may also vary with the medication and the reason for which you were taking it.  If you are taking any of these medications, please make sure you notify your pain physician before you undergo any  procedures.         Epidural Steroid Injection Patient Information  Description: The epidural space surrounds the nerves as they exit the spinal cord.  In some patients, the nerves can be compressed and inflamed by a bulging disc or a tight spinal canal (spinal stenosis).  By injecting steroids into the epidural space, we can bring irritated nerves into direct contact with a potentially helpful medication.  These steroids act directly on the irritated nerves and can reduce swelling and inflammation which often leads to decreased pain.  Epidural steroids may be injected anywhere along the spine and from the neck to the low back depending upon the location of your pain.   After numbing the skin with local anesthetic (like Novocaine), a small needle is passed   into the epidural space slowly.  You may experience a sensation of pressure while this is being done.  The entire block usually last less than 10 minutes.  Conditions which may be treated by epidural steroids:   Low back and leg pain  Neck and arm pain  Spinal stenosis  Post-laminectomy syndrome  Herpes zoster (shingles) pain  Pain from compression fractures  Preparation for the injection:  1. Do not eat any solid food or dairy products within 6 hours of your appointment.  2. You may drink clear liquids up to 2 hours before appointment.  Clear liquids include water, black coffee, juice or soda.  No milk or cream please. 3. You may take your regular medication, including pain medications, with a sip of water before your appointment  Diabetics should hold regular insulin (if taken separately) and take 1/2 normal NPH dos the morning of the procedure.  Carry some sugar containing items with you to your appointment. 4. A driver must accompany you and be prepared to drive you home after your procedure.  5. Bring all your current medications with your. 6. An IV may be inserted and sedation may be given at the discretion of the  physician.   7. A blood pressure cuff, EKG and other monitors will often be applied during the procedure.  Some patients may need to have extra oxygen administered for a short period. 8. You will be asked to provide medical information, including your allergies, prior to the procedure.  We must know immediately if you are taking blood thinners (like Coumadin/Warfarin)  Or if you are allergic to IV iodine contrast (dye). We must know if you could possible be pregnant.  Possible side-effects:  Bleeding from needle site  Infection (rare, may require surgery)  Nerve injury (rare)  Numbness & tingling (temporary)  Difficulty urinating (rare, temporary)  Spinal headache ( a headache worse with upright posture)  Light -headedness (temporary)  Pain at injection site (several days)  Decreased blood pressure (temporary)  Weakness in arm/leg (temporary)  Pressure sensation in back/neck (temporary)  Call if you experience:  Fever/chills associated with headache or increased back/neck pain.  Headache worsened by an upright position.  New onset weakness or numbness of an extremity below the injection site  Hives or difficulty breathing (go to the emergency room)  Inflammation or drainage at the infection site  Severe back/neck pain  Any new symptoms which are concerning to you  Please note:  Although the local anesthetic injected can often make your back or neck feel good for several hours after the injection, the pain will likely return.  It takes 3-7 days for steroids to work in the epidural space.  You may not notice any pain relief for at least that one week.  If effective, we will often do a series of three injections spaced 3-6 weeks apart to maximally decrease your pain.  After the initial series, we generally will wait several months before considering a repeat injection of the same type.  If you have any questions, please call [phone] [facility] Clinic[facility]ioning Radiofrequency lesioning is a procedure that is performed to relieve pain. The procedure is often used for back, neck, or arm pain. Radiofrequency lesioning involves the use of a machine that creates radio waves to make heat. During the procedure, the heat is applied to the nerve that carries the pain signal. The heat damages the nerve and interferes with the pain signal. Pain relief usually  lasts for 6 months to 1 year. LET [facility] CARE PROVIDER KNOW ABOUT: 7. Any allergies you have. 8. All medicines you are taking, including vitamins, herbs, eye drops, creams, and over-the-counter medicines. 9. Previous problems you or members of your family have had with the use of anesthetics. 10. Any blood disorders you have. 11. Previous surgeries you have had. 12. Any medical conditions you have. 13. Whether you are pregnant or may be pregnant. RISKS AND COMPLICATIONS Generally, this is a safe procedure. However, problems may occur, including: 9. Pain or soreness at the injection site. 10. Infection at the injection site. 11. Damage to nerves or blood vessels. BEFORE THE PROCEDURE 12. Ask your health care provider about: 1. Changing or stopping your regular medicines. This is especially important if you are taking diabetes medicines or blood thinners. 2. Taking medicines such as aspirin and ibuprofen. These medicines can thin your blood. Do not take these medicines before your procedure if your health care provider instructs you not to. 13. Follow instructions from your health care provider about eating or drinking restrictions. 14. Plan to have someone take you home after the procedure. 15. If you go home right after the procedure, plan to have someone with you for 24 hours. PROCEDURE 8. You will be given one or more of the following: 1. A medicine to help you relax (sedative). 2. A medicine to numb the area (local anesthetic). 9. You will be awake  during the procedure. You will need to be able to talk with the health care provider during the procedure. 10. With the help of a type of X-ray (fluoroscopy), the health care provider will insert a radiofrequency needle into the area to be treated. 11. Next, a wire that carries the radio waves (electrode) will be put through the radiofrequency needle. An electrical pulse will be sent through the electrode to verify the correct nerve. You will feel a tingling sensation, and you may have muscle twitching. 12. Then, the tissue that is around the needle tip will be heated by an electric current that is passed using the radiofrequency machine. This will numb the nerves. 13. A bandage (dressing) will be put on the insertion area after the procedure is done. The procedure may vary among health care providers and hospitals. AFTER THE PROCEDURE 12. Your blood pressure, heart rate, breathing rate, and blood oxygen level will be monitored often until the medicines you were given have worn off. 13. Return to your normal activities as directed by your health care provider.   This information is not intended to replace advice given to you by your health care provider. Make sure you discuss any questions you have with your health care provider.   Document Released: 07/16/2011 Document Revised: 08/08/2015 Document Reviewed: 12/25/2014 Elsevier Interactive Patient Education 2016 [employer] Facet Blocks Patient Information  Description: The facets are joints in the spine between the vertebrae.  Like any joints in the body, facets can become irritated and painful.  Arthritis can also effect the facets.  By injecting steroids and local anesthetic in and around these joints, we can temporarily block the nerve supply to them.  Steroids act directly on irritated nerves and tissues to reduce selling and inflammation which often leads to decreased pain.  Facet blocks may be done anywhere along the spine from the neck to  the low back depending upon the location of your pain.   After numbing the skin with local anesthetic (like Novocaine), a small needle is passed  onto the facet joints under x-ray guidance.  You may experience a sensation of pressure while this is being done.  The entire block usually lasts about 15-25 minutes.   Conditions which may be treated by facet blocks:   Low back/buttock pain  Neck/shoulder pain  Certain types of headaches  Preparation for the injection:  16. Do not eat any solid food or dairy products within 6 hours of your appointment. 17. You may drink clear liquid up to 2 hours before appointment.  Clear liquids include water, black coffee, juice or soda.  No milk or cream please. 18. You may take your regular medication, including pain medications, with a sip of water before your appointment.  Diabetics should hold regular insulin (if taken separately) and take 1/2 normal NPH dose the morning of the procedure.  Carry some sugar containing items with you to your appointment. 19. A driver must accompany you and be prepared to drive you home after your procedure. 20. Bring all your current medications with you. 21. An IV may be inserted and sedation may be given at the discretion of the physician. 22. A blood pressure cuff, EKG and other monitors will often be applied during the procedure.  Some patients may need to have extra oxygen administered for a short period. 23. You will be asked to provide medical information, including your allergies and medications, prior to the procedure.  We must know immediately if you are taking blood thinners (like Coumadin/Warfarin) or if you are allergic to IV iodine contrast (dye).  We must know if you could possible be pregnant.  Possible side-effects:   Bleeding from needle site  Infection (rare, may require surgery)  Nerve injury (rare)  Numbness & tingling (temporary)  Difficulty urinating (rare, temporary)  Spinal headache (a  headache worse with upright posture)  Light-headedness (temporary)  Pain at injection site (serveral days)  Decreased blood pressure (rare, temporary)  Weakness in arm/leg (temporary)  Pressure sensation in back/neck (temporary)   Call if you experience:   Fever/chills associated with headache or increased back/neck pain  Headache worsened by an upright position  New onset, weakness or numbness of an extremity below the injection site  Hives or difficulty breathing (go to the emergency room)  Inflammation or drainage at the injection site(s)  Severe back/neck pain greater than usual  New symptoms which are concerning to you  Please note:  Although the local anesthetic injected can often make your back or neck feel good for several hours after the injection, the pain will likely return. It takes 3-7 days for steroids to work.  You may not notice any pain relief for at least one week.  If effective, we will often do a series of 2-3 injections spaced 3-6 weeks apart to maximally decrease your pain.  After the initial series, you may be a candidate for a more permanent nerve block of the facets.  If you have any questions, please call #[phone] [facility]

## 2015-12-23 LAB — TOXASSURE SELECT 13 (MW), URINE: PDF: 0

## 2016-01-11 NOTE — Progress Notes (Signed)
Quick Note:  NOTE: This forensic urine drug screen (UDS) test was conducted using a state-of-the-art ultra high performance liquid chromatography and mass spectrometry system (UPLC/MS-MS), the most sophisticated and accurate method available. UPLC/MS-MS is 1,000 times more precise and accurate than standard gas chromatography and mass spectrometry (GC/MS). This system can analyze 26 drug categories and 180 drug compounds.  The results of this test came back with unexpected findings. Unreported benzodiazepines.  The 2016 CDC Guideline Recommendations state: "Clinicians should avoid prescribing opioid pain medication and benzodiazepines concurrently whenever possible (recommendation category: A, evidence type: 3)" - Recommendations and Reports, [place] Department of Health and Human Services/Centers for Disease Control and Prevention 32 MMWR / February 16, 2015 / Vol. 65 / No. 1 / Page 31-2 / item 11.  Benzodiazepines and opioids both cause central nervous system depression and can decrease respiratory drive. Concurrent use is likely to put patients at greater risk for potentially fatal overdose.  The contextual evidence review found evidence in epidemiologic series of concurrent benzodiazepine use in large proportions of opioid-related overdose deaths, and a case-cohort study found concurrent benzodiazepine prescription with opioid prescription to be associated with a near quadrupling of risk for overdose death compared with opioid prescription alone.  A commonly used tapering schedule that has been used safely and with moderate success is a reduction of the benzodiazepine dose by 25% every 1-2 weeks.  Examples of oral benzodiazepines are: alprazolam (Xanax, Xanax XR) clobazam (Onfi) clonazepam (Klonopin) clorazepate (Tranxene, Tranxene SD) chlordiazepoxide (Librium) diazepam (Valium, Diastat Acudial, Diastat) estazolam (Prosom is a discontinued brand in the [place]) lorazepam (Ativan) oxazepam (Serax is a  discontinued brand in the [place]) temazepam (Restoril) triazolam (Halcion) ______

## 2016-03-26 ENCOUNTER — Encounter: Payer: Self-pay | Admitting: Pain Medicine

## 2016-03-26 ENCOUNTER — Ambulatory Visit: Payer: Medicare Other | Attending: Pain Medicine | Admitting: Pain Medicine

## 2016-03-26 ENCOUNTER — Other Ambulatory Visit
Admission: RE | Admit: 2016-03-26 | Discharge: 2016-03-26 | Disposition: A | Discharge status: Home / Self Care | Payer: Medicare Other | Source: Ambulatory Visit | Attending: Pain Medicine | Admitting: Pain Medicine

## 2016-03-26 VITALS — BP 111/68 | HR 63 | Temp 98.5°F | Resp 18 | Ht 63.0 in | Wt 219.0 lb

## 2016-03-26 DIAGNOSIS — H8110 Benign paroxysmal vertigo, unspecified ear: Secondary | ICD-10-CM | POA: Diagnosis not present

## 2016-03-26 DIAGNOSIS — M810 Age-related osteoporosis without current pathological fracture: Secondary | ICD-10-CM | POA: Diagnosis not present

## 2016-03-26 DIAGNOSIS — E669 Obesity, unspecified: Secondary | ICD-10-CM | POA: Insufficient documentation

## 2016-03-26 DIAGNOSIS — M797 Fibromyalgia: Secondary | ICD-10-CM | POA: Diagnosis not present

## 2016-03-26 DIAGNOSIS — R29818 Other symptoms and signs involving the nervous system: Secondary | ICD-10-CM

## 2016-03-26 DIAGNOSIS — F329 Major depressive disorder, single episode, unspecified: Secondary | ICD-10-CM | POA: Diagnosis not present

## 2016-03-26 DIAGNOSIS — M6281 Muscle weakness (generalized): Secondary | ICD-10-CM | POA: Insufficient documentation

## 2016-03-26 DIAGNOSIS — Z79891 Long term (current) use of opiate analgesic: Secondary | ICD-10-CM

## 2016-03-26 DIAGNOSIS — E559 Vitamin D deficiency, unspecified: Secondary | ICD-10-CM | POA: Diagnosis not present

## 2016-03-26 DIAGNOSIS — R2689 Other abnormalities of gait and mobility: Secondary | ICD-10-CM

## 2016-03-26 DIAGNOSIS — G629 Polyneuropathy, unspecified: Secondary | ICD-10-CM

## 2016-03-26 DIAGNOSIS — M791 Myalgia: Secondary | ICD-10-CM

## 2016-03-26 DIAGNOSIS — L409 Psoriasis, unspecified: Secondary | ICD-10-CM | POA: Diagnosis not present

## 2016-03-26 DIAGNOSIS — D869 Sarcoidosis, unspecified: Secondary | ICD-10-CM | POA: Insufficient documentation

## 2016-03-26 DIAGNOSIS — M5020 Other cervical disc displacement, unspecified cervical region: Secondary | ICD-10-CM | POA: Insufficient documentation

## 2016-03-26 DIAGNOSIS — R200 Anesthesia of skin: Secondary | ICD-10-CM | POA: Diagnosis not present

## 2016-03-26 DIAGNOSIS — G8929 Other chronic pain: Secondary | ICD-10-CM | POA: Diagnosis not present

## 2016-03-26 DIAGNOSIS — E1142 Type 2 diabetes mellitus with diabetic polyneuropathy: Secondary | ICD-10-CM

## 2016-03-26 DIAGNOSIS — D589 Hereditary hemolytic anemia, unspecified: Secondary | ICD-10-CM | POA: Insufficient documentation

## 2016-03-26 DIAGNOSIS — M542 Cervicalgia: Secondary | ICD-10-CM | POA: Diagnosis present

## 2016-03-26 DIAGNOSIS — M25519 Pain in unspecified shoulder: Secondary | ICD-10-CM | POA: Insufficient documentation

## 2016-03-26 DIAGNOSIS — E785 Hyperlipidemia, unspecified: Secondary | ICD-10-CM | POA: Insufficient documentation

## 2016-03-26 DIAGNOSIS — R209 Unspecified disturbances of skin sensation: Secondary | ICD-10-CM | POA: Diagnosis not present

## 2016-03-26 DIAGNOSIS — K219 Gastro-esophageal reflux disease without esophagitis: Secondary | ICD-10-CM | POA: Insufficient documentation

## 2016-03-26 DIAGNOSIS — D696 Thrombocytopenia, unspecified: Secondary | ICD-10-CM | POA: Insufficient documentation

## 2016-03-26 DIAGNOSIS — I509 Heart failure, unspecified: Secondary | ICD-10-CM | POA: Diagnosis not present

## 2016-03-26 DIAGNOSIS — G5711 Meralgia paresthetica, right lower limb: Secondary | ICD-10-CM | POA: Diagnosis not present

## 2016-03-26 DIAGNOSIS — M064 Inflammatory polyarthropathy: Secondary | ICD-10-CM | POA: Insufficient documentation

## 2016-03-26 DIAGNOSIS — J449 Chronic obstructive pulmonary disease, unspecified: Secondary | ICD-10-CM | POA: Diagnosis not present

## 2016-03-26 DIAGNOSIS — N281 Cyst of kidney, acquired: Secondary | ICD-10-CM | POA: Diagnosis not present

## 2016-03-26 DIAGNOSIS — Z5181 Encounter for therapeutic drug level monitoring: Secondary | ICD-10-CM | POA: Diagnosis not present

## 2016-03-26 DIAGNOSIS — F1190 Opioid use, unspecified, uncomplicated: Secondary | ICD-10-CM

## 2016-03-26 DIAGNOSIS — I110 Hypertensive heart disease with heart failure: Secondary | ICD-10-CM | POA: Diagnosis not present

## 2016-03-26 DIAGNOSIS — I129 Hypertensive chronic kidney disease with stage 1 through stage 4 chronic kidney disease, or unspecified chronic kidney disease: Secondary | ICD-10-CM | POA: Insufficient documentation

## 2016-03-26 DIAGNOSIS — R790 Abnormal level of blood mineral: Secondary | ICD-10-CM

## 2016-03-26 DIAGNOSIS — M7918 Myalgia, other site: Secondary | ICD-10-CM

## 2016-03-26 LAB — COMPREHENSIVE METABOLIC PANEL WITH GFR
ALT: 20 U/L (ref 14–54)
AST: 24 U/L (ref 15–41)
Albumin: 4 g/dL (ref 3.5–5.0)
Alkaline Phosphatase: 70 U/L (ref 38–126)
Anion gap: 8 (ref 5–15)
BUN: 14 mg/dL (ref 6–20)
CO2: 28 mmol/L (ref 22–32)
Calcium: 8.5 mg/dL — ABNORMAL LOW (ref 8.9–10.3)
Chloride: 100 mmol/L — ABNORMAL LOW (ref 101–111)
Creatinine, Ser: 1.28 mg/dL — ABNORMAL HIGH (ref 0.44–1.00)
GFR calc Af Amer: 50 mL/min — ABNORMAL LOW
GFR calc non Af Amer: 44 mL/min — ABNORMAL LOW
Glucose, Bld: 96 mg/dL (ref 65–99)
Potassium: 3.8 mmol/L (ref 3.5–5.1)
Sodium: 136 mmol/L (ref 135–145)
Total Bilirubin: 0.7 mg/dL (ref 0.3–1.2)
Total Protein: 7.4 g/dL (ref 6.5–8.1)

## 2016-03-26 LAB — C-REACTIVE PROTEIN: CRP: 2.1 mg/dL — AB (ref <1.0)

## 2016-03-26 LAB — SEDIMENTATION RATE: SED RATE: 52 mm/h — AB (ref 0–30)

## 2016-03-26 LAB — VITAMIN B12: Vitamin B-12: 467 pg/mL (ref 180–914)

## 2016-03-26 LAB — MAGNESIUM: Magnesium: 1.8 mg/dL (ref 1.7–2.4)

## 2016-03-26 LAB — PLATELET COUNT: Platelets: 146 10*3/uL — ABNORMAL LOW (ref 150–440)

## 2016-03-26 MED ORDER — GABAPENTIN 300 MG PO CAPS: 300.0000 mg | ORAL_CAPSULE | Freq: Four times a day (QID) | ORAL | Status: DC

## 2016-03-26 MED ORDER — TIZANIDINE HCL 4 MG PO TABS: 4.0000 mg | ORAL_TABLET | Freq: Two times a day (BID) | ORAL | Status: DC | PRN

## 2016-03-26 MED ORDER — OXYCODONE HCL 5 MG PO TABS: 5.0000 mg | ORAL_TABLET | Freq: Three times a day (TID) | ORAL | Status: DC | PRN

## 2016-03-26 MED ORDER — PREGABALIN 150 MG PO CAPS: 150.0000 mg | ORAL_CAPSULE | Freq: Three times a day (TID) | ORAL | Status: DC

## 2016-03-26 MED ORDER — MAGNESIUM OXIDE 400 MG PO TABS: 400.0000 mg | ORAL_TABLET | Freq: Every day | ORAL | Status: DC

## 2016-03-26 NOTE — Progress Notes (Signed)
Safety precautions to be maintained throughout the outpatient stay will include: orient to surroundings, keep bed in low position, maintain call bell within reach at all times, provide assistance with transfer out of bed and ambulation.  #23 out of 90 Oxycodone 5 mg remaining. Filled on 03-05-16.

## 2016-03-26 NOTE — Progress Notes (Signed)
Patient's Name: Amber Acosta  Patient type: Established  MRN: [id number]  Service setting: Ambulatory outpatient  DOB: 1951/10/04  Location: [facility]  DOS: 03/26/2016  Primary Care Physician: Amber Humphrey, MD  Note by: Amber Levans. Laban Acosta, M.D, DABA, DABAPM, DABPM, Amber Acosta, FIPP  Referring Physician: Rayetta Humphrey, MD  Specialty: Board-Certified Interventional Pain Management     Primary Reason(s) for Visit: Encounter for prescription drug management (Level of risk: moderate) CC: Neck Pain and Shoulder Pain   HPI  Amber Acosta is a 65 y.o. year old, female patient, who returns today as an established patient. 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); Displacement of cervical intervertebral disc without myelopathy; Essential (primary) hypertension; Gall bladder polyp; Acid reflux; HLD (hyperlipidemia); Inflammatory polyarthropathy (HCC); Sleep terror; Adiposity; Arthritis, degenerative; OP (osteoporosis); Psoriasis; Kidney cysts; Muscle weakness (generalized); Sarcoidosis (HCC); Thrombocytopenia (HCC); Avitaminosis D; Chronic low back pain (Location of Primary Source of Pain) (Bilateral) (R>L); Chronic neck pain; Cervicogenic headache; Cervical facet syndrome; Chronic shoulder pain; At risk for falls; Balance problems; Chronic pain syndrome; History of closed head injury; Opiate use (22.5 MME/Day); Uncomplicated opioid dependence (HCC); Long term current use of opiate analgesic; Encounter for therapeutic drug level monitoring; Long term prescription opiate use; Spondylolisthesis of cervical region; Cervical  spondylosis without myelopathy; 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HCC); Hx of cervical spine surgery; Low magnesium levels; Myofascial pain; Chronic pain; Neuropathy (HCC); Disturbance of skin sensation; Hemolytic anemia (HCC); Other symptoms and signs involving the musculoskeletal system; Benign paroxysmal positional vertigo; Diabetic peripheral neuropathy (HCC); and Meralgia paresthetica (Right) on her problem list.. Her primarily concern today is the Neck Pain and Shoulder Pain   Pain Assessment: Self-Reported Pain Score: 3  Reported level is compatible with observation Pain Type: Chronic pain Pain Location: Shoulder Pain Orientation: Right, Left Pain Descriptors / Indicators: Constant, Nagging, Aching Pain Frequency: Constant  The patient comes into the clinics today for pharmacological management of her chronic pain. The patient returns to the clinics today indicating that she is no longer taking the pregabalin due to cost. She was recently diagnosed with diabetic peripheral neuropathy and she does have neuropathic pain in the extremities with burning sensation. I believe that she should be on some medication therefore I will put it back on Neurontin. She indicates that at one time she was taking 3600 mg of gabapentin without any problems. We will go ahead and start her with 300 mg pills and titrated up as tolerated. The patient has been given some written instructions on how to do the titration. Today she indicates that her primary pain is in the lower back followed by the pain in the neck and then the shoulders. She also describes a new symptom that she has been experiencing lately. She indicates having some intermittent numbness of the right thigh that come as a consequence of sitting with her leg propped up. Today's physical exam confirmed tenderness over the right lateral femoral continuous nerve suggesting a neuralgia (meralgia paresthetica). Since she is overweight, this is a very likely reason for  this.  Date of Last Visit: 12/19/15 Service Provided on Last Visit: Med Refill  Controlled Substance Pharmacotherapy Assessment & REMS (Risk Evaluation and Mitigation Strategy)  Analgesic: Oxycodone IR 5 mg every  8 hours (15 mg/day) Pill Count: #23 out of 90 Oxycodone 5 mg remaining. Filled on 03-05-16 MME/day: 22.5 mg/day Pharmacokinetics: Onset of action (Liberation/Absorption): Within expected pharmacological parameters Time to Peak effect (Distribution): Timing and results are as within normal expected parameters Duration of action (Metabolism/Excretion): Within normal limits for medication Pharmacodynamics: Analgesic Effect: More than 50% Activity Facilitation: Medication(s) allow patient to sit, stand, walk, and do the basic ADLs Perceived Effectiveness: Described as relatively effective, allowing for increase in activities of daily living (ADL) Side-effects or Adverse reactions: None reported Monitoring: [place] PMP: Online review of the past 38-month period conducted. Compliant with practice rules and regulations UDS Results/interpretation: The patient's last UDS was done on 12/19/2015 and it came back with unexpected results due to the presence of benzodiazepines. The patient has been made aware of the CDC guidelines. Medication Assessment Form: Reviewed. Patient indicates being compliant with therapy Treatment compliance: Compliant Risk Assessment: Aberrant Behavior: None observed today Substance Use Disorder (SUD) Risk Level: No change since last visit Risk of opioid abuse or dependence: 0.7-3.0% with doses ? 36 MME/day and 6.1-26% with doses ? 120 MME/day. Opioid Risk Tool (ORT) Score: Total Score: 4 Moderate Risk for SUD (Score between 4-7) Depression Scale Score: PHQ-2: PHQ-2 Total Score: 0 No depression (0) PHQ-9: PHQ-9 Total Score: 0 No depression (0-4)  Pharmacologic Plan: No change in therapy, at this time  Laboratory Chemistry  Inflammation Markers Lab Results   Component Value Date   ESRSEDRATE 34* 10/19/2013    Renal Function Lab Results  Component Value Date   BUN 18 10/19/2013   CREATININE 1.27 10/19/2013   GFRAA 53* 10/19/2013   GFRNONAA 46* 10/19/2013    Hepatic Function No results found for: AST, ALT, ALBUMIN  Electrolytes Lab Results  Component Value Date   NA 133* 10/19/2013   K 4.0 10/19/2013   CL 99 10/19/2013   CALCIUM 8.9 10/19/2013   MG 1.6* 10/19/2013    Pain Modulating Vitamins No results found for: VD25OH, VD125OH2TOT, ZD6644IH4, VQ2595GL8, VITAMINB12  Coagulation Parameters No results found for: INR, LABPROT  Note: I personally reviewed the above data. Results shared with patient.  Meds  The patient has a current medication list which includes the following prescription(s): amlodipine, diclofenac sodium, ferrous sulfate, fluoxetine, furosemide, insulin nph human, lovastatin, magnesium oxide, metformin, metoprolol, oxycodone, oxycodone, oxycodone, potassium chloride sa, tizanidine, trazodone, and gabapentin.  Current Outpatient Prescriptions on File Prior to Visit  Medication Sig  . amLODipine (NORVASC) 5 MG tablet Take 10 mg by mouth daily.  . diclofenac sodium (VOLTAREN) 1 % GEL Apply topically as needed.  . Ferrous Sulfate (SLOW FE PO) Take by mouth daily.  Marland Kitchen FLUoxetine (PROZAC) 40 MG capsule Take 40 mg by mouth daily.  . furosemide (LASIX) 20 MG tablet Take 20 mg by mouth daily. 1-2 tabs daily PRN  . lovastatin (MEVACOR) 10 MG tablet Take 10 mg by mouth at bedtime.  . metFORMIN (GLUMETZA) 1000 MG (MOD) 24 hr tablet Take 1,000 mg by mouth 2 (two) times daily with a meal.  . metoprolol (LOPRESSOR) 50 MG tablet Take 50 mg by mouth daily.  . potassium chloride SA (K-DUR,KLOR-CON) 20 MEQ tablet Take 20 mEq by mouth daily. prn  . traZODone (DESYREL) 100 MG tablet Take 200 mg by mouth once.   No current facility-administered medications on file prior to visit.    ROS  Constitutional: Afebrile, no chills,  well hydrated and well nourished Gastrointestinal: No upper or lower GI bleeding, no nausea, no vomiting  and no acute GI distress Musculoskeletal: No acute joint swelling or redness, no acute loss of range of motion and no acute onset weakness Neurological: Denies any acute onset apraxia, no episodes of paralysis, no acute loss of coordination, no acute loss of consciousness and no acute onset aphasia, dysarthria, agnosia, or amnesia  Allergies  Ms. Ansley is allergic to mushroom extract complex; enbrel; mtx support; darvon; and minocycline.  PFSH  Medical:  Ms. Allington  has a past medical history of Hypertension; Diabetes mellitus without complication (HCC); Sarcoid (HCC); Chronic pain; Depression; GERD (gastroesophageal reflux disease); Thrombopenia (HCC); Fibromyalgia; Arthritis; and Kidney disease, chronic, stage III (GFR 30-59 ml/min). Family: family history includes Alcohol abuse in her brother; Alzheimer's disease in her mother; COPD in her father; Diabetes in her father; Heart disease in her father. Surgical:  has past surgical history that includes neck fusion; Breast surgery; Back surgery; Knee arthroscopy; Fracture surgery; Hand surgery; and Abdominal hysterectomy. Tobacco:  reports that she has never smoked. She does not have any smokeless tobacco history on file. Alcohol:  reports that she does not drink alcohol. Drug:  reports that she does not use illicit drugs.  Physical Examination  Constitutional Vitals: Blood pressure 111/68, pulse 63, temperature 98.5 F (36.9 C), temperature source Oral, resp. rate 18, height 5\' 3"  (1.6 m), weight 219 lb (99.338 kg), SpO2 96 %. Calculated BMI: Body mass index is 38.8 kg/(m^2).       General appearance: Alert, cooperative, oriented x 3, in no acute distress, well nourished, well developed, well hydrated Eyes: PERLA Respiratory: No evidence respiratory distress, no audible rales or ronchi and no use of accessory muscles of respiration Psych:  Alert, oriented to person, oriented to place and oriented to time  Cervical Spine Exam  Inspection: Normal anatomy, no anomalies observed Cervical Lordosis: Normal Alignment: Symetrical Functional ROM: Within functional limits (WFL) AROM: WFL Sensory: No sensory anomalies reported or detected  Upper Extremity Exam    Right  Left  Inspection: No gross anomalies detected  Inspection: No gross anomalies detected  Functional ROM: Adequate  Functional ROM: Adequate  AROM: Adequate  AROM: Adequate  Sensory: No sensory anomalies reported or detected  Sensory: No sensory anomalies reported or detected  Motor: Unremarkable  Motor: Unremarkable  Vascular: Normal skin color, temperature, and hair growth. No peripheral edema or cyanosis  Vascular: Normal skin color, temperature, and hair growth. No peripheral edema or cyanosis   Thoracic Spine  Inspection: No gross anomalies detected Alignment: Symetrical Functional ROM: Within functional limits [facility]) AROM: Adequate Palpation: WNL  Lumbar Spine  Inspection: No gross anomalies detected Alignment: Symetrical Functional ROM: Within functional limits [facility]) AROM: Decreased Sensory: No sensory anomalies reported or detected Palpation: Tender. Tenderness over the right lateral femoral cutaneous nerve. Provocative Tests: Lumbar Hyperextension and rotation test: Positive bilaterally for facet pain Patrick's Maneuver: deferred  Gait Assessment  Gait: WNL  Lower Extremities    Right  Left  Inspection: No gross anomalies detected  Inspection: No gross anomalies detected  Functional ROM: Within functional limits [facility])  Functional ROM: Within functional limits (WFL)  AROM: Adequate  AROM: Adequate  Sensory: No sensory anomalies reported or detected  Sensory: No sensory anomalies reported or detected  Motor: Unremarkable  Motor: Unremarkable  Toe walk (S1): WNL  Toe walk (S1): WNL  Heal walk (L5): WNL  Heal walk (L5): WNL   Assessment & Plan   Primary Diagnosis & Pertinent Problem List: The primary encounter diagnosis was Chronic pain. Diagnoses of Encounter  for therapeutic drug level monitoring, Long term current use of opiate analgesic, Opiate use (22.5 MME/Day), Fibromyalgia, Myofascial pain, Low magnesium levels, Avitaminosis D, OP (osteoporosis), Neuropathy (HCC), Disturbance of skin sensation, Thrombocytopenia (HCC), Balance problems, Benign paroxysmal positional vertigo, unspecified laterality, Diabetic peripheral neuropathy (HCC), and Meralgia paresthetica (Right) were also pertinent to this visit.  Visit Diagnosis: 1. Chronic pain   2. Encounter for therapeutic drug level monitoring   3. Long term current use of opiate analgesic   4. Opiate use (22.5 MME/Day)   5. Fibromyalgia   6. Myofascial pain   7. Low magnesium levels   8. Avitaminosis D   9. OP (osteoporosis)   10. Neuropathy (HCC)   11. Disturbance of skin sensation   12. Thrombocytopenia (HCC)   13. Balance problems   14. Benign paroxysmal positional vertigo, unspecified laterality   15. Diabetic peripheral neuropathy (HCC)   16. Meralgia paresthetica (Right)     Problems updated and reviewed during this visit: Problem  Neuropathy (Hcc)  Disturbance of Skin Sensation  Diabetic Peripheral Neuropathy (Hcc)  Meralgia paresthetica (Right)  Back Pain, Chronic  Sarcoidosis (Hcc)  Opiate use (22.5 MME/Day)  Benign Paroxysmal Positional Vertigo  Inflammatory Polyarthropathy (Hcc)  Thrombocytopenia (Hcc)  Balance Problems  Avitaminosis D  Op (Osteoporosis)  Other Symptoms and Signs Involving The Musculoskeletal System  Chronic Obstructive Pulmonary Disease (Hcc)  Clinical Depression  Type 2 Diabetes Mellitus (Hcc)   Overview:  Diagnosed 15 years. insulin past few years Overview:  Diagnosed 15 years. insulin past few years   Hypertensive Heart Disease With Congestive Heart Failure (Hcc)   Overview:  Preserved left heart dysfunction likely secondary  to hypertensive cardiomyopathy. Overview:  Preserved left heart dysfunction likely secondary to hypertensive cardiomyopathy.   Hemolytic Anemia (Hcc)  Hld (Hyperlipidemia)   Last Assessment & Plan:  Well controlled. Patient is not fasting today but will nedd repeat at future visit. Last Assessment & Plan:  Well controlled. Patient is not fasting today but will nedd repeat at future visit.     Problem-specific Plan(s): No problem-specific assessment & plan notes found for this encounter.  No new assessment & plan notes have been filed under this hospital service since the last note was generated. Service: Pain Management   Plan of Care   Problem List Items Addressed This Visit      High   Chronic pain - Primary (Chronic)   Relevant Medications   oxyCODONE (OXY IR/ROXICODONE) 5 MG immediate release tablet   oxyCODONE (OXY IR/ROXICODONE) 5 MG immediate release tablet   oxyCODONE (OXY IR/ROXICODONE) 5 MG immediate release tablet   tiZANidine (ZANAFLEX) 4 MG tablet   gabapentin (NEURONTIN) 300 MG capsule   Other Relevant Orders   Comprehensive metabolic panel   C-reactive protein   Magnesium   Sedimentation rate   Diabetic peripheral neuropathy (HCC) (Chronic)   Relevant Medications   insulin NPH Human (HUMULIN N,NOVOLIN N) 100 UNIT/ML injection   tiZANidine (ZANAFLEX) 4 MG tablet   gabapentin (NEURONTIN) 300 MG capsule   Disturbance of skin sensation (Chronic)   Relevant Orders   Vitamin B12   Fibromyalgia (Chronic)   Relevant Medications   gabapentin (NEURONTIN) 300 MG capsule   Meralgia paresthetica (Right)   Relevant Medications   tiZANidine (ZANAFLEX) 4 MG tablet   gabapentin (NEURONTIN) 300 MG capsule   Myofascial pain (Chronic)   Relevant Medications   tiZANidine (ZANAFLEX) 4 MG tablet   Neuropathy (HCC) (Chronic)   Relevant Medications   gabapentin (NEURONTIN) 300 MG  capsule     Medium   Encounter for therapeutic drug level monitoring   Long term  current use of opiate analgesic (Chronic)   Relevant Orders   ToxASSURE Select 13 (MW), Urine   Low magnesium levels   Relevant Medications   magnesium oxide (MAG-OX) 400 MG tablet   Opiate use (22.5 MME/Day) (Chronic)     Low   Avitaminosis D   Relevant Orders   Vitamin D 1,25 dihydroxy   Balance problems   Benign paroxysmal positional vertigo   OP (osteoporosis)   Relevant Orders   Vitamin D 1,25 dihydroxy   Thrombocytopenia (HCC)   Relevant Orders   Platelet count       Pharmacotherapy (Medications Ordered): Meds ordered this encounter  Medications  . oxyCODONE (OXY IR/ROXICODONE) 5 MG immediate release tablet    Sig: Take 1 tablet (5 mg total) by mouth every 8 (eight) hours as needed for moderate pain or severe pain.    Dispense:  90 tablet    Refill:  0    Do not place this medication, or any other prescription from our practice, on "Automatic Refill". Patient may have prescription filled one day early if pharmacy is closed on scheduled refill date. Do not fill until: 03/31/16 To last until: 04/30/16  . oxyCODONE (OXY IR/ROXICODONE) 5 MG immediate release tablet    Sig: Take 1 tablet (5 mg total) by mouth every 8 (eight) hours as needed for moderate pain or severe pain.    Dispense:  90 tablet    Refill:  0    Do not place this medication, or any other prescription from our practice, on "Automatic Refill". Patient may have prescription filled one day early if pharmacy is closed on scheduled refill date. Do not fill until: 04/30/16 To last until: 05/30/16  . oxyCODONE (OXY IR/ROXICODONE) 5 MG immediate release tablet    Sig: Take 1 tablet (5 mg total) by mouth every 8 (eight) hours as needed for moderate pain or severe pain.    Dispense:  90 tablet    Refill:  0    Do not place this medication, or any other prescription from our practice, on "Automatic Refill". Patient may have prescription filled one day early if pharmacy is closed on scheduled refill date. Do not  fill until: 05/30/16 To last until: 06/29/16  . DISCONTD: pregabalin (LYRICA) 150 MG capsule    Sig: Take 1 capsule (150 mg total) by mouth 3 (three) times daily.    Dispense:  90 capsule    Refill:  2    Do not place this medication, or any other prescription from our practice, on "Automatic Refill". Patient may have prescription filled one day early if pharmacy is closed on scheduled refill date.  Marland Kitchen. tiZANidine (ZANAFLEX) 4 MG tablet    Sig: Take 1 tablet (4 mg total) by mouth 2 (two) times daily as needed for muscle spasms.    Dispense:  60 tablet    Refill:  2    Do not place this medication, or any other prescription from our practice, on "Automatic Refill". Patient may have prescription filled one day early if pharmacy is closed on scheduled refill date.  . magnesium oxide (MAG-OX) 400 MG tablet    Sig: Take 1 tablet (400 mg total) by mouth daily.    Dispense:  30 tablet    Refill:  2    Do not place this medication, or any other prescription from our practice, on "Automatic Refill". Patient  may have prescription filled one day early if pharmacy is closed on scheduled refill date.  . gabapentin (NEURONTIN) 300 MG capsule    Sig: Take 1-3 capsules (300-900 mg total) by mouth 4 (four) times daily. Follow titration schedule.    Dispense:  360 capsule    Refill:  2    Do not place this medication, or any other prescription from our practice, on "Automatic Refill". Patient may have prescription filled one day early if pharmacy is closed on scheduled refill date.    Lab-work & Procedure Ordered: Orders Placed This Encounter  Procedures  . ToxASSURE Select 13 (MW), Urine  . Comprehensive metabolic panel  . C-reactive protein  . Magnesium  . Sedimentation rate  . Vitamin B12  . Vitamin D 1,25 dihydroxy  . Platelet count    Imaging Ordered: None  Interventional Therapies: Scheduled:  None at this time.    Considering:  Bilateral lumbar facet block.    PRN Procedures:   Diagnostic bilateral lumbar facet block under fluoroscopic guidance and IV sedation.    Referral(s) or Consult(s): None at this time.  New Prescriptions   GABAPENTIN (NEURONTIN) 300 MG CAPSULE    Take 1-3 capsules (300-900 mg total) by mouth 4 (four) times daily. Follow titration schedule.    Medications administered during this visit: Ms. Wiemann had no medications administered during this visit.  Future Appointments Date Time Provider Department Center  06/16/2016 8:20 AM Delano Metz, MD [facility] None    Primary Care Physician: Amber Humphrey, MD Location: [facility] Note by: Amber Acosta, M.D, DABA, DABAPM, DABPM, DABIPP, FIPP  Pain Score Disclaimer: We use the NRS-11 scale. This is a self-reported, subjective measurement of pain severity with only modest accuracy. It is used primarily to identify changes within a particular patient. It must be understood that outpatient pain scales are significantly less accurate that those used for research, where they can be applied under ideal controlled circumstances with minimal exposure to variables. In reality, the score is likely to be a combination of pain intensity and pain affect, where pain affect describes the degree of emotional arousal or changes in action readiness caused by the sensory experience of pain. Factors such as social and work situation, setting, emotional state, anxiety levels, expectation, and prior pain experience may influence pain perception and show large inter-individual differences that may also be affected by time variables.

## 2016-03-27 LAB — VITAMIN D 25 HYDROXY (VIT D DEFICIENCY, FRACTURES): Vit D, 25-Hydroxy: 20.2 ng/mL — ABNORMAL LOW (ref 30.0–100.0)

## 2016-03-28 ENCOUNTER — Encounter: Payer: Self-pay | Admitting: Pain Medicine

## 2016-03-28 DIAGNOSIS — M47812 Spondylosis without myelopathy or radiculopathy, cervical region: Secondary | ICD-10-CM | POA: Insufficient documentation

## 2016-04-01 LAB — TOXASSURE SELECT 13 (MW), URINE: PDF: 0

## 2016-04-06 NOTE — Progress Notes (Signed)
Quick Note:   A normal sedimentation rate should be below 30 mm/hr. The sed rate is an acute phase reactant that indirectly measures the degree of inflammation present in the body. It can be acute, developing rapidly after trauma, injury or infection, for example, or can occur over an extended time (chronic) with conditions such as autoimmune diseases or cancer. The ESR is not diagnostic; it is a non-specific, screening test that may be elevated in a number of these different conditions. It provides general information about the presence or absence of an inflammatory condition.  The combined elevation of the ESR & CRP, may be suggestive of an autoimmune disease. Should this be the case, we will inquire if the patient has had a rheumatologic evaluation looking at the RF levels, ANA levels, and CBC.  ______ 

## 2016-04-06 NOTE — Progress Notes (Signed)
Quick Note:   Normal levels of C-Reactive Protein for our Lab are less than 1.0 mg/L. C-reactive protein (CRP) is produced by the liver. The level of CRP rises when there is inflammation throughout the body. CRP goes up in response to inflammation. High levels suggests the presence of chronic inflammation but do not identify its location or cause. High levels have been observed in obese patients, individuals with bacterial infections, chronic inflammation, or flare-ups of inflammatory conditions. Drops of previously elevated levels suggest that the inflammation or infection is subsiding and/or responding to treatment.  The combined elevation of the ESR & CRP, may be suggestive of an autoimmune disease. Should this be the case, we will inquire if the patient has had a rheumatologic evaluation looking at the RF levels, ANA levels, and CBC.  ______

## 2016-04-06 NOTE — Progress Notes (Signed)
Quick Note:   Low levels of platelets is known as thrombocytopenia. Causes may include: Viral infection (mononucleosis, measles, hepatitis); Rocky [place] spotted fever; Platelet autoantibody; Drugs (acetaminophen, quinidine, sulfa drugs); Cirrhosis; Autoimmune disorders; Sepsis; Leukemia, lymphoma; Myelodysplasia; and/or Chemotherapy or radiation therapy. ______

## 2016-04-06 NOTE — Progress Notes (Signed)

## 2016-04-06 NOTE — Progress Notes (Signed)
Quick Note:   Normal chloride levels are between 95 and 107 mEq/L. Low levels may be due to: Addison disease; Bartter syndrome; burns; congestive heart failure; dehydration; excessive sweating; hyperaldosteronism; metabolic alkalosis; respiratory acidosis (compensated); Syndrome of inappropriate diuretic hormone secretion (SIADH); or vomiting.  Normal Creatinine levels are between 0.5 and 0.9 mg/dl for our lab. Any condition that impairs the function of the kidneys is likely to raise the creatinine level in the blood. The most common causes of longstanding (chronic) kidney disease in adults are high blood pressure and diabetes. Other causes of elevated blood creatinine levels include drugs, ingestion of a large amount of dietary meat, kidney infections, rhabdomyolysis (abnormal muscle breakdown), and urinary tract obstruction.  Normal calcium levels are between 9.0 and 10.5 mg/dl. The most common cause of low total calcium is: Low blood protein levels, especially a low level of albumin, which can result from liver disease or malnutrition, both of which may result from alcoholism or other illnesses. Low albumin is also very common in people who are acutely ill. With low albumin, only the bound calcium is low. Ionized calcium remains normal, and calcium metabolism is being regulated appropriately. Some other causes of hypocalcemia include: Underactive parathyroid gland (hypoparathyroidism); Inherited resistance to the effects of parathyroid hormone; Extreme deficiency in dietary calcium; Decreased levels of vitamin D; Magnesium deficiency; Increased levels of phosphorus; Acute inflammation of the pancreas (pancreatitis); & Renal failure.  eGFR (Estimated Glomerular Filtration Rate) results are reported as milliliters/minute/1.74m (mL/min/1.729m. Because some laboratories do not collect information on a patient's race when the sample is collected for testing, they may report calculated results for both  African Americans and non-African Americans.  The NaNationwide Mutual InsuranceNSelect [facility], Incsuggests only reporting actual results once values are < 60 mL/min. 1. Normal values: 90-120 mL/min 2. Below 60 mL/min suggests that some kidney damage has occurred. 3. Between [id number] 30 indicate (Moderate) Stage 3 kidney disease. 4. Between 29 and 15 represent (Severe) Stage 4 kidney disease. 5. Less than 15 is considered (Kidney Failure) Stage 5. ______

## 2016-04-10 DIAGNOSIS — N3946 Mixed incontinence: Secondary | ICD-10-CM | POA: Insufficient documentation

## 2016-04-10 DIAGNOSIS — N3281 Overactive bladder: Secondary | ICD-10-CM | POA: Insufficient documentation

## 2016-04-24 DIAGNOSIS — I5032 Chronic diastolic (congestive) heart failure: Secondary | ICD-10-CM | POA: Insufficient documentation

## 2016-06-16 ENCOUNTER — Encounter: Payer: Medicare Other | Admitting: Pain Medicine

## 2016-06-18 ENCOUNTER — Other Ambulatory Visit: Payer: Self-pay | Admitting: Pain Medicine

## 2016-06-23 ENCOUNTER — Ambulatory Visit: Payer: Medicare Other | Attending: Pain Medicine | Admitting: Pain Medicine

## 2016-06-23 ENCOUNTER — Encounter: Payer: Self-pay | Admitting: Pain Medicine

## 2016-06-23 VITALS — BP 181/78 | HR 83 | Temp 98.3°F | Resp 18 | Ht 63.0 in | Wt 220.0 lb

## 2016-06-23 DIAGNOSIS — M791 Myalgia: Secondary | ICD-10-CM | POA: Insufficient documentation

## 2016-06-23 DIAGNOSIS — J449 Chronic obstructive pulmonary disease, unspecified: Secondary | ICD-10-CM | POA: Diagnosis not present

## 2016-06-23 DIAGNOSIS — R51 Headache: Secondary | ICD-10-CM | POA: Diagnosis not present

## 2016-06-23 DIAGNOSIS — E785 Hyperlipidemia, unspecified: Secondary | ICD-10-CM | POA: Insufficient documentation

## 2016-06-23 DIAGNOSIS — M4312 Spondylolisthesis, cervical region: Secondary | ICD-10-CM | POA: Insufficient documentation

## 2016-06-23 DIAGNOSIS — M542 Cervicalgia: Secondary | ICD-10-CM | POA: Diagnosis not present

## 2016-06-23 DIAGNOSIS — M5136 Other intervertebral disc degeneration, lumbar region: Secondary | ICD-10-CM | POA: Insufficient documentation

## 2016-06-23 DIAGNOSIS — M4186 Other forms of scoliosis, lumbar region: Secondary | ICD-10-CM | POA: Insufficient documentation

## 2016-06-23 DIAGNOSIS — Z79891 Long term (current) use of opiate analgesic: Secondary | ICD-10-CM

## 2016-06-23 DIAGNOSIS — E559 Vitamin D deficiency, unspecified: Secondary | ICD-10-CM | POA: Diagnosis not present

## 2016-06-23 DIAGNOSIS — N3941 Urge incontinence: Secondary | ICD-10-CM | POA: Insufficient documentation

## 2016-06-23 DIAGNOSIS — E669 Obesity, unspecified: Secondary | ICD-10-CM | POA: Insufficient documentation

## 2016-06-23 DIAGNOSIS — N281 Cyst of kidney, acquired: Secondary | ICD-10-CM | POA: Diagnosis not present

## 2016-06-23 DIAGNOSIS — G8929 Other chronic pain: Secondary | ICD-10-CM | POA: Diagnosis not present

## 2016-06-23 DIAGNOSIS — M25512 Pain in left shoulder: Secondary | ICD-10-CM | POA: Diagnosis present

## 2016-06-23 DIAGNOSIS — D589 Hereditary hemolytic anemia, unspecified: Secondary | ICD-10-CM | POA: Diagnosis not present

## 2016-06-23 DIAGNOSIS — M797 Fibromyalgia: Secondary | ICD-10-CM

## 2016-06-23 DIAGNOSIS — M5382 Other specified dorsopathies, cervical region: Secondary | ICD-10-CM

## 2016-06-23 DIAGNOSIS — D869 Sarcoidosis, unspecified: Secondary | ICD-10-CM | POA: Insufficient documentation

## 2016-06-23 DIAGNOSIS — I129 Hypertensive chronic kidney disease with stage 1 through stage 4 chronic kidney disease, or unspecified chronic kidney disease: Secondary | ICD-10-CM | POA: Diagnosis not present

## 2016-06-23 DIAGNOSIS — M810 Age-related osteoporosis without current pathological fracture: Secondary | ICD-10-CM | POA: Insufficient documentation

## 2016-06-23 DIAGNOSIS — N189 Chronic kidney disease, unspecified: Secondary | ICD-10-CM | POA: Insufficient documentation

## 2016-06-23 DIAGNOSIS — K219 Gastro-esophageal reflux disease without esophagitis: Secondary | ICD-10-CM | POA: Insufficient documentation

## 2016-06-23 DIAGNOSIS — D696 Thrombocytopenia, unspecified: Secondary | ICD-10-CM | POA: Diagnosis not present

## 2016-06-23 DIAGNOSIS — M4806 Spinal stenosis, lumbar region: Secondary | ICD-10-CM | POA: Insufficient documentation

## 2016-06-23 DIAGNOSIS — M7918 Myalgia, other site: Secondary | ICD-10-CM

## 2016-06-23 DIAGNOSIS — R790 Abnormal level of blood mineral: Secondary | ICD-10-CM

## 2016-06-23 DIAGNOSIS — M25511 Pain in right shoulder: Secondary | ICD-10-CM | POA: Insufficient documentation

## 2016-06-23 DIAGNOSIS — M5126 Other intervertebral disc displacement, lumbar region: Secondary | ICD-10-CM | POA: Insufficient documentation

## 2016-06-23 DIAGNOSIS — M47812 Spondylosis without myelopathy or radiculopathy, cervical region: Secondary | ICD-10-CM

## 2016-06-23 DIAGNOSIS — G4486 Cervicogenic headache: Secondary | ICD-10-CM

## 2016-06-23 DIAGNOSIS — E1122 Type 2 diabetes mellitus with diabetic chronic kidney disease: Secondary | ICD-10-CM | POA: Insufficient documentation

## 2016-06-23 MED ORDER — MAGNESIUM OXIDE 400 MG PO TABS: 400.0000 mg | ORAL_TABLET | Freq: Every day | ORAL | 2 refills | Status: DC

## 2016-06-23 MED ORDER — OXYCODONE HCL 5 MG PO TABS: 5.0000 mg | ORAL_TABLET | Freq: Three times a day (TID) | ORAL | 0 refills | Status: DC | PRN

## 2016-06-23 MED ORDER — TIZANIDINE HCL 4 MG PO TABS: 4.0000 mg | ORAL_TABLET | Freq: Two times a day (BID) | ORAL | 2 refills | Status: DC | PRN

## 2016-06-23 MED ORDER — PREGABALIN 150 MG PO CAPS: 150.0000 mg | ORAL_CAPSULE | Freq: Two times a day (BID) | ORAL | 2 refills | Status: DC

## 2016-06-23 NOTE — Progress Notes (Signed)
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 tablet    Refill:  0    Do not place this medication, or any other prescription from our practice, on "Automatic Refill". Patient may have prescription filled one day early if pharmacy is closed on scheduled refill date. Do not fill until: 06/29/16 To last until: 07/29/16  . magnesium oxide (MAG-OX) 400 MG tablet    Sig: Take 1 tablet (400 mg total) by mouth daily.  Dispense:  30 tablet    Refill:  2    Do not place this medication, or any other prescription from our practice, on "Automatic Refill". Patient may have prescription filled one day early if pharmacy is closed on scheduled refill date.  Marland Kitchen tiZANidine (ZANAFLEX) 4 MG tablet    Sig: Take 1 tablet (4 mg total) by mouth 2 (two) times daily as needed for muscle spasms.    Dispense:  60 tablet    Refill:  2     Do not place this medication, or any other prescription from our practice, on "Automatic Refill". Patient may have prescription filled one day early if pharmacy is closed on scheduled refill date.  . pregabalin (LYRICA) 150 MG capsule    Sig: Take 1 capsule (150 mg total) by mouth 2 (two) times daily.    Dispense:  60 capsule    Refill:  2    Do not place this medication, or any other prescription from our practice, on "Automatic Refill". Patient may have prescription filled one day early if pharmacy is closed on scheduled refill date.    Lab-work & Procedure Ordered: Orders Placed This Encounter  Procedures  . CERVICAL FACET (MEDIAL BRANCH NERVE BLOCK)     Imaging Ordered: None  Interventional Therapies: Scheduled:  None at this time.    Considering:  None at this time.    PRN Procedures:  None at this time.    Referral(s) or Consult(s): None at this time.  New Prescriptions   PREGABALIN (LYRICA) 150 MG CAPSULE    Take 1 capsule (150 mg total) by mouth 2 (two) times daily.    Medications administered during this visit: Ms. Encarnacion had no medications administered during this visit.  Requested PM Follow-up: Return in 3 months (on 09/15/2016) for Med-Mgmt, (3-Mo).  Future Appointments Date Time Provider Department Center  07/10/2016 12:45 PM Delano Metz, MD [facility] None  09/15/2016 8:00 AM Delano Metz, MD [facility] None    Primary Care Physician: Rayetta Humphrey, MD Location: [facility] Note by: Sydnee Levans Laban Emperor, M.D, DABA, DABAPM, DABPM, DABIPP, FIPP  Pain Score Disclaimer: We use the NRS-11 scale. This is a self-reported, subjective measurement of pain severity with only modest accuracy. It is used primarily to identify changes within a particular patient. It must be understood that outpatient pain scales are significantly less accurate that those used for research, where they can be applied under ideal controlled  circumstances with minimal exposure to variables. In reality, the score is likely to be a combination of pain intensity and pain affect, where pain affect describes the degree of emotional arousal or changes in action readiness caused by the sensory experience of pain. Factors such as social and work situation, setting, emotional state, anxiety levels, expectation, and prior pain experience may influence pain perception and show large inter-individual differences that may also be affected by time variables.  Patient instructions provided during this appointment: Patient Instructions  Pregabalin capsules What is this medicine? PREGABALIN (pre GAB a lin) is used to treat nerve pain from diabetes, shingles, spinal cord injury, and fibromyalgia. It is also used to control seizures in epilepsy. This medicine may be used for other purposes; ask your health care provider or pharmacist if you have questions. What should I tell my health care provider before I take this medicine? They need to know if you have any of these conditions: -bleeding problems -heart disease, including heart failure -history of alcohol or drug abuse -  kidney disease -suicidal thoughts, plans, or attempt; a previous suicide attempt by you or a family member -an unusual or allergic reaction to pregabalin, gabapentin, other medicines, foods, dyes, or preservatives -pregnant or trying to get pregnant or trying to conceive with your partner -breast-feeding How should I use this medicine? Take this medicine by mouth with a glass of water. Follow the directions on the prescription label. You can take this medicine with or without food. Take your doses at regular intervals. Do not take your medicine more often than directed. Do not stop taking except on your doctor's advice. A special MedGuide will be given to you by the pharmacist with each prescription and refill. Be sure to read this information carefully each time. Talk to your  pediatrician regarding the use of this medicine in children. Special care may be needed. Overdosage: If you think you have taken too much of this medicine contact a poison control center or emergency room at once. NOTE: This medicine is only for you. Do not share this medicine with others. What if I miss a dose? If you miss a dose, take it as soon as you can. If it is almost time for your next dose, take only that dose. Do not take double or extra doses. What may interact with this medicine? -alcohol -certain medicines for blood pressure like captopril, enalapril, or lisinopril -certain medicines for diabetes, like pioglitazone or rosiglitazone -certain medicines for anxiety or sleep -narcotic medicines for pain This list may not describe all possible interactions. Give your health care provider a list of all the medicines, herbs, non-prescription drugs, or dietary supplements you use. Also tell them if you smoke, drink alcohol, or use illegal drugs. Some items may interact with your medicine. What should I watch for while using this medicine? Tell your doctor or healthcare professional if your symptoms do not start to get better or if they get worse. Visit your doctor or health care professional for regular checks on your progress. Do not stop taking except on your doctor's advice. You may develop a severe reaction. Your doctor will tell you how much medicine to take. Wear a medical identification bracelet or chain if you are taking this medicine for seizures, and carry a card that describes your disease and details of your medicine and dosage times. You may get drowsy or dizzy. Do not drive, use machinery, or do anything that needs mental alertness until you know how this medicine affects you. Do not stand or sit up quickly, especially if you are an older patient. This reduces the risk of dizzy or fainting spells. Alcohol may interfere with the effect of this medicine. Avoid alcoholic drinks. If  you have a heart condition, like congestive heart failure, and notice that you are retaining water and have swelling in your hands or feet, contact your health care provider immediately. The use of this medicine may increase the chance of suicidal thoughts or actions. Pay special attention to how you are responding while on this medicine. Any worsening of mood, or thoughts of suicide or dying should be reported to your health care professional right away. This medicine has caused reduced sperm counts in some men. This may interfere with the ability to father a child. You should talk to your doctor or health care professional if you are concerned about your fertility. Women who become pregnant while using this medicine for seizures may enroll in the [place] American Antiepileptic Drug Pregnancy Registry by calling [phone]. This registry collects information  about the safety of antiepileptic drug use during pregnancy. What side effects may I notice from receiving this medicine? Side effects that you should report to your doctor or health care professional as soon as possible: -allergic reactions like skin rash, itching or hives, swelling of the face, lips, or tongue -breathing problems -changes in vision -chest pain -confusion -jerking or unusual movements of any part of your body -loss of memory -muscle pain, tenderness, or weakness -suicidal thoughts or other mood changes -swelling of the ankles, feet, hands -unusual bruising or bleeding Side effects that usually do not require medical attention (Report these to your doctor or health care professional if they continue or are bothersome.): -dizziness -drowsiness -dry mouth -headache -nausea -tremors -trouble sleeping -weight gain This list may not describe all possible side effects. Call your doctor for medical advice about side effects. You may report side effects to FDA at 1-800-FDA-1088. Where should I keep my medicine? Keep out of  the reach of children. This medicine can be abused. Keep your medicine in a safe place to protect it from theft. Do not share this medicine with anyone. Selling or giving away this medicine is dangerous and against the law. This medicine may cause accidental overdose and death if it taken by other adults, children, or pets. Mix any unused medicine with a substance like cat litter or coffee grounds. Then throw the medicine away in a sealed container like a sealed bag or a coffee can with a lid. Do not use the medicine after the expiration date. Store at room temperature between 15 and 30 degrees C (59 and 86 degrees F). NOTE: This sheet is a summary. It may not cover all possible information. If you have questions about this medicine, talk to your doctor, pharmacist, or health care provider.    2016, Elsevier/Gold Standard. (2014-01-13 15:38:53) Radiofrequency Lesioning Radiofrequency lesioning is a procedure that is performed to relieve pain. The procedure is often used for back, neck, or arm pain. Radiofrequency lesioning involves the use of a machine that creates radio waves to make heat. During the procedure, the heat is applied to the nerve that carries the pain signal. The heat damages the nerve and interferes with the pain signal. Pain relief usually lasts for 6 months to 1 year. LET [facility] CARE PROVIDER KNOW ABOUT:  Any allergies you have.  All medicines you are taking, including vitamins, herbs, eye drops, creams, and over-the-counter medicines.  Previous problems you or members of your family have had with the use of anesthetics.  Any blood disorders you have.  Previous surgeries you have had.  Any medical conditions you have.  Whether you are pregnant or may be pregnant. RISKS AND COMPLICATIONS Generally, this is a safe procedure. However, problems may occur, including:  Pain or soreness at the injection site.  Infection at the injection site.  Damage to nerves or blood  vessels. BEFORE THE PROCEDURE  Ask your health care provider about:  Changing or stopping your regular medicines. This is especially important if you are taking diabetes medicines or blood thinners.  Taking medicines such as aspirin and ibuprofen. These medicines can thin your blood. Do not take these medicines before your procedure if your health care provider instructs you not to.  Follow instructions from your health care provider about eating or drinking restrictions.  Plan to have someone take you home after the procedure.  If you go home right after the procedure, plan to have someone with you for 24 hours.  PROCEDURE  You will be given one or more of the following:  A medicine to help you relax (sedative).  A medicine to numb the area (local anesthetic).  You will be awake during the procedure. You will need to be able to talk with the health care provider during the procedure.  With the help of a type of X-ray (fluoroscopy), the health care provider will insert a radiofrequency needle into the area to be treated.  Next, a wire that carries the radio waves (electrode) will be put through the radiofrequency needle. An electrical pulse will be sent through the electrode to verify the correct nerve. You will feel a tingling sensation, and you may have muscle twitching.  Then, the tissue that is around the needle tip will be heated by an electric current that is passed using the radiofrequency machine. This will numb the nerves.  A bandage (dressing) will be put on the insertion area after the procedure is done. The procedure may vary among health care providers and hospitals. AFTER THE PROCEDURE  Your blood pressure, heart rate, breathing rate, and blood oxygen level will be monitored often until the medicines you were given have worn off.  Return to your normal activities as directed by your health care provider.   This information is not intended to replace advice given to  you by your health care provider. Make sure you discuss any questions you have with your health care provider.   Document Released: 07/16/2011 Document Revised: 08/08/2015 Document Reviewed: 12/25/2014 Elsevier Interactive Patient Education 2016 [employer]. Facet Blocks Patient Information  Description: The facets are joints in the spine between the vertebrae.  Like any joints in the body, facets can become irritated and painful.  Arthritis can also effect the facets.  By injecting steroids and local anesthetic in and around these joints, we can temporarily block the nerve supply to them.  Steroids act directly on irritated nerves and tissues to reduce selling and inflammation which often leads to decreased pain.  Facet blocks may be done anywhere along the spine from the neck to the low back depending upon the location of your pain.   After numbing the skin with local anesthetic (like Novocaine), a small needle is passed onto the facet joints under x-ray guidance.  You may experience a sensation of pressure while this is being done.  The entire block usually lasts about 15-25 minutes.   Conditions which may be treated by facet blocks:   Low back/buttock pain  Neck/shoulder pain  Certain types of headaches  Preparation for the injection:  1. Do not eat any solid food or dairy products within 8 hours of your appointment. 2. You may drink clear liquid up to 3 hours before appointment.  Clear liquids include water, black coffee, juice or soda.  No milk or cream please. 3. You may take your regular medication, including pain medications, with a sip of water before your appointment.  Diabetics should hold regular insulin (if taken separately) and take 1/2 normal NPH dose the morning of the procedure.  Carry some sugar containing items with you to your appointment. 4. A driver must accompany you and be prepared to drive you home after your procedure. 5. Bring all your current medications with  you. 6. An IV may be inserted and sedation may be given at the discretion of the physician. 7. A blood pressure cuff, EKG and other monitors will often be applied during the procedure.  Some patients may need to have  extra oxygen administered for a short period. 8. You will be asked to provide medical information, including your allergies and medications, prior to the procedure.  We must know immediately if you are taking blood thinners (like Coumadin/Warfarin) or if you are allergic to IV iodine contrast (dye).  We must know if you could possible be pregnant.  Possible side-effects:   Bleeding from needle site  Infection (rare, may require surgery)  Nerve injury (rare)  Numbness & tingling (temporary)  Difficulty urinating (rare, temporary)  Spinal headache (a headache worse with upright posture)  Light-headedness (temporary)  Pain at injection site (serveral days)  Decreased blood pressure (rare, temporary)  Weakness in arm/leg (temporary)  Pressure sensation in back/neck (temporary)   Call if you experience:   Fever/chills associated with headache or increased back/neck pain  Headache worsened by an upright position  New onset, weakness or numbness of an extremity below the injection site  Hives or difficulty breathing (go to the emergency room)  Inflammation or drainage at the injection site(s)  Severe back/neck pain greater than usual  New symptoms which are concerning to you  Please note:  Although the local anesthetic injected can often make your back or neck feel good for several hours after the injection, the pain will likely return. It takes 3-7 days for steroids to work.  You may not notice any pain relief for at least one week.  If effective, we will often do a series of 2-3 injections spaced 3-6 weeks apart to maximally decrease your pain.  After the initial series, you may be a candidate for a more permanent nerve block of the facets.  If you have  any questions, please call #[phone] [facility]

## 2016-06-23 NOTE — Patient Instructions (Signed)
Pregabalin capsules What is this medicine? PREGABALIN (pre GAB a lin) is used to treat nerve pain from diabetes, shingles, spinal cord injury, and fibromyalgia. It is also used to control seizures in epilepsy. This medicine may be used for other purposes; ask your health care provider or pharmacist if you have questions. What should I tell my health care provider before I take this medicine? They need to know if you have any of these conditions: -bleeding problems -heart disease, including heart failure -history of alcohol or drug abuse -kidney disease -suicidal thoughts, plans, or attempt; a previous suicide attempt by you or a family member -an unusual or allergic reaction to pregabalin, gabapentin, other medicines, foods, dyes, or preservatives -pregnant or trying to get pregnant or trying to conceive with your partner -breast-feeding How should I use this medicine? Take this medicine by mouth with a glass of water. Follow the directions on the prescription label. You can take this medicine with or without food. Take your doses at regular intervals. Do not take your medicine more often than directed. Do not stop taking except on your doctor's advice. A special MedGuide will be given to you by the pharmacist with each prescription and refill. Be sure to read this information carefully each time. Talk to your pediatrician regarding the use of this medicine in children. Special care may be needed. Overdosage: If you think you have taken too much of this medicine contact a poison control center or emergency room at once. NOTE: This medicine is only for you. Do not share this medicine with others. What if I miss a dose? If you miss a dose, take it as soon as you can. If it is almost time for your next dose, take only that dose. Do not take double or extra doses. What may interact with this medicine? -alcohol -certain medicines for blood pressure like captopril, enalapril, or  lisinopril -certain medicines for diabetes, like pioglitazone or rosiglitazone -certain medicines for anxiety or sleep -narcotic medicines for pain This list may not describe all possible interactions. Give your health care provider a list of all the medicines, herbs, non-prescription drugs, or dietary supplements you use. Also tell them if you smoke, drink alcohol, or use illegal drugs. Some items may interact with your medicine. What should I watch for while using this medicine? Tell your doctor or healthcare professional if your symptoms do not start to get better or if they get worse. Visit your doctor or health care professional for regular checks on your progress. Do not stop taking except on your doctor's advice. You may develop a severe reaction. Your doctor will tell you how much medicine to take. Wear a medical identification bracelet or chain if you are taking this medicine for seizures, and carry a card that describes your disease and details of your medicine and dosage times. You may get drowsy or dizzy. Do not drive, use machinery, or do anything that needs mental alertness until you know how this medicine affects you. Do not stand or sit up quickly, especially if you are an older patient. This reduces the risk of dizzy or fainting spells. Alcohol may interfere with the effect of this medicine. Avoid alcoholic drinks. If you have a heart condition, like congestive heart failure, and notice that you are retaining water and have swelling in your hands or feet, contact your health care provider immediately. The use of this medicine may increase the chance of suicidal thoughts or actions. Pay special attention to how you are   responding while on this medicine. Any worsening of mood, or thoughts of suicide or dying should be reported to your health care professional right away. This medicine has caused reduced sperm counts in some men. This may interfere with the ability to father a child. You  should talk to your doctor or health care professional if you are concerned about your fertility. Women who become pregnant while using this medicine for seizures may enroll in the [place] [employer] Antiepileptic Drug Pregnancy Registry by calling [phone]. This registry collects information about the safety of antiepileptic drug use during pregnancy. What side effects may I notice from receiving this medicine? Side effects that you should report to your doctor or health care professional as soon as possible: -allergic reactions like skin rash, itching or hives, swelling of the face, lips, or tongue -breathing problems -changes in vision -chest pain -confusion -jerking or unusual movements of any part of your body -loss of memory -muscle pain, tenderness, or weakness -suicidal thoughts or other mood changes -swelling of the ankles, feet, hands -unusual bruising or bleeding Side effects that usually do not require medical attention (Report these to your doctor or health care professional if they continue or are bothersome.): -dizziness -drowsiness -dry mouth -headache -nausea -tremors -trouble sleeping -weight gain This list may not describe all possible side effects. Call your doctor for medical advice about side effects. You may report side effects to FDA at 1-800-FDA-1088. Where should I keep my medicine? Keep out of the reach of children. This medicine can be abused. Keep your medicine in a safe place to protect it from theft. Do not share this medicine with anyone. Selling or giving away this medicine is dangerous and against the law. This medicine may cause accidental overdose and death if it taken by other adults, children, or pets. Mix any unused medicine with a substance like cat litter or coffee grounds. Then throw the medicine away in a sealed container like a sealed bag or a coffee can with a lid. Do not use the medicine after the expiration date. Store at room temperature  between 15 and 30 degrees C (59 and 86 degrees F). NOTE: This sheet is a summary. It may not cover all possible information. If you have questions about this medicine, talk to your doctor, pharmacist, or health care provider.    2016, Elsevier/Gold Standard. (2014-01-13 15:38:53) Radiofrequency Lesioning Radiofrequency lesioning is a procedure that is performed to relieve pain. The procedure is often used for back, neck, or arm pain. Radiofrequency lesioning involves the use of a machine that creates radio waves to make heat. During the procedure, the heat is applied to the nerve that carries the pain signal. The heat damages the nerve and interferes with the pain signal. Pain relief usually lasts for 6 months to 1 year. LET [facility] CARE PROVIDER KNOW ABOUT:  Any allergies you have.  All medicines you are taking, including vitamins, herbs, eye drops, creams, and over-the-counter medicines.  Previous problems you or members of your family have had with the use of anesthetics.  Any blood disorders you have.  Previous surgeries you have had.  Any medical conditions you have.  Whether you are pregnant or may be pregnant. RISKS AND COMPLICATIONS Generally, this is a safe procedure. However, problems may occur, including:  Pain or soreness at the injection site.  Infection at the injection site.  Damage to nerves or blood vessels. BEFORE THE PROCEDURE  Ask your health care provider about:  Changing or stopping  your regular medicines. This is especially important if you are taking diabetes medicines or blood thinners.  Taking medicines such as aspirin and ibuprofen. These medicines can thin your blood. Do not take these medicines before your procedure if your health care provider instructs you not to.  Follow instructions from your health care provider about eating or drinking restrictions.  Plan to have someone take you home after the procedure.  If you go home right after the  procedure, plan to have someone with you for 24 hours. PROCEDURE  You will be given one or more of the following:  A medicine to help you relax (sedative).  A medicine to numb the area (local anesthetic).  You will be awake during the procedure. You will need to be able to talk with the health care provider during the procedure.  With the help of a type of X-ray (fluoroscopy), the health care provider will insert a radiofrequency needle into the area to be treated.  Next, a wire that carries the radio waves (electrode) will be put through the radiofrequency needle. An electrical pulse will be sent through the electrode to verify the correct nerve. You will feel a tingling sensation, and you may have muscle twitching.  Then, the tissue that is around the needle tip will be heated by an electric current that is passed using the radiofrequency machine. This will numb the nerves.  A bandage (dressing) will be put on the insertion area after the procedure is done. The procedure may vary among health care providers and hospitals. AFTER THE PROCEDURE  Your blood pressure, heart rate, breathing rate, and blood oxygen level will be monitored often until the medicines you were given have worn off.  Return to your normal activities as directed by your health care provider.   This information is not intended to replace advice given to you by your health care provider. Make sure you discuss any questions you have with your health care provider.   Document Released: 07/16/2011 Document Revised: 08/08/2015 Document Reviewed: 12/25/2014 Elsevier Interactive Patient Education 2016 [employer] Facet Blocks Patient Information  Description: The facets are joints in the spine between the vertebrae.  Like any joints in the body, facets can become irritated and painful.  Arthritis can also effect the facets.  By injecting steroids and local anesthetic in and around these joints, we can temporarily block  the nerve supply to them.  Steroids act directly on irritated nerves and tissues to reduce selling and inflammation which often leads to decreased pain.  Facet blocks may be done anywhere along the spine from the neck to the low back depending upon the location of your pain.   After numbing the skin with local anesthetic (like Novocaine), a small needle is passed onto the facet joints under x-ray guidance.  You may experience a sensation of pressure while this is being done.  The entire block usually lasts about 15-25 minutes.   Conditions which may be treated by facet blocks:   Low back/buttock pain  Neck/shoulder pain  Certain types of headaches  Preparation for the injection:  1. Do not eat any solid food or dairy products within 8 hours of your appointment. 2. You may drink clear liquid up to 3 hours before appointment.  Clear liquids include water, black coffee, juice or soda.  No milk or cream please. 3. You may take your regular medication, including pain medications, with a sip of water before your appointment.  Diabetics should hold regular insulin (  if taken separately) and take 1/2 normal NPH dose the morning of the procedure.  Carry some sugar containing items with you to your appointment. 4. A driver must accompany you and be prepared to drive you home after your procedure. 5. Bring all your current medications with you. 6. An IV may be inserted and sedation may be given at the discretion of the physician. 7. A blood pressure cuff, EKG and other monitors will often be applied during the procedure.  Some patients may need to have extra oxygen administered for a short period. 8. You will be asked to provide medical information, including your allergies and medications, prior to the procedure.  We must know immediately if you are taking blood thinners (like Coumadin/Warfarin) or if you are allergic to IV iodine contrast (dye).  We must know if you could possible be pregnant.  Possible  side-effects:   Bleeding from needle site  Infection (rare, may require surgery)  Nerve injury (rare)  Numbness & tingling (temporary)  Difficulty urinating (rare, temporary)  Spinal headache (a headache worse with upright posture)  Light-headedness (temporary)  Pain at injection site (serveral days)  Decreased blood pressure (rare, temporary)  Weakness in arm/leg (temporary)  Pressure sensation in back/neck (temporary)   Call if you experience:   Fever/chills associated with headache or increased back/neck pain  Headache worsened by an upright position  New onset, weakness or numbness of an extremity below the injection site  Hives or difficulty breathing (go to the emergency room)  Inflammation or drainage at the injection site(s)  Severe back/neck pain greater than usual  New symptoms which are concerning to you  Please note:  Although the local anesthetic injected can often make your back or neck feel good for several hours after the injection, the pain will likely return. It takes 3-7 days for steroids to work.  You may not notice any pain relief for at least one week.  If effective, we will often do a series of 2-3 injections spaced 3-6 weeks apart to maximally decrease your pain.  After the initial series, you may be a candidate for a more permanent nerve block of the facets.  If you have any questions, please call #[phone] [facility]

## 2016-06-24 DIAGNOSIS — D592 Drug-induced nonautoimmune hemolytic anemia: Secondary | ICD-10-CM | POA: Insufficient documentation

## 2016-06-24 NOTE — Telephone Encounter (Signed)

## 2016-07-10 ENCOUNTER — Encounter: Payer: Self-pay | Admitting: Pain Medicine

## 2016-07-10 ENCOUNTER — Ambulatory Visit: Payer: Medicare Other | Attending: Pain Medicine | Admitting: Pain Medicine

## 2016-07-10 VITALS — BP 114/58 | HR 63 | Temp 97.0°F | Resp 14 | Ht 63.0 in | Wt 220.0 lb

## 2016-07-10 DIAGNOSIS — M47816 Spondylosis without myelopathy or radiculopathy, lumbar region: Secondary | ICD-10-CM | POA: Insufficient documentation

## 2016-07-10 DIAGNOSIS — Z79891 Long term (current) use of opiate analgesic: Secondary | ICD-10-CM | POA: Insufficient documentation

## 2016-07-10 DIAGNOSIS — D589 Hereditary hemolytic anemia, unspecified: Secondary | ICD-10-CM | POA: Diagnosis not present

## 2016-07-10 DIAGNOSIS — Z87828 Personal history of other (healed) physical injury and trauma: Secondary | ICD-10-CM | POA: Diagnosis not present

## 2016-07-10 DIAGNOSIS — N281 Cyst of kidney, acquired: Secondary | ICD-10-CM | POA: Diagnosis not present

## 2016-07-10 DIAGNOSIS — I130 Hypertensive heart and chronic kidney disease with heart failure and stage 1 through stage 4 chronic kidney disease, or unspecified chronic kidney disease: Secondary | ICD-10-CM | POA: Insufficient documentation

## 2016-07-10 DIAGNOSIS — I5032 Chronic diastolic (congestive) heart failure: Secondary | ICD-10-CM | POA: Insufficient documentation

## 2016-07-10 DIAGNOSIS — K219 Gastro-esophageal reflux disease without esophagitis: Secondary | ICD-10-CM | POA: Insufficient documentation

## 2016-07-10 DIAGNOSIS — N189 Chronic kidney disease, unspecified: Secondary | ICD-10-CM | POA: Diagnosis not present

## 2016-07-10 DIAGNOSIS — M797 Fibromyalgia: Secondary | ICD-10-CM | POA: Insufficient documentation

## 2016-07-10 DIAGNOSIS — E1140 Type 2 diabetes mellitus with diabetic neuropathy, unspecified: Secondary | ICD-10-CM | POA: Insufficient documentation

## 2016-07-10 DIAGNOSIS — M5382 Other specified dorsopathies, cervical region: Secondary | ICD-10-CM | POA: Diagnosis not present

## 2016-07-10 DIAGNOSIS — M25519 Pain in unspecified shoulder: Secondary | ICD-10-CM | POA: Insufficient documentation

## 2016-07-10 DIAGNOSIS — H8110 Benign paroxysmal vertigo, unspecified ear: Secondary | ICD-10-CM | POA: Insufficient documentation

## 2016-07-10 DIAGNOSIS — R51 Headache: Secondary | ICD-10-CM

## 2016-07-10 DIAGNOSIS — E1122 Type 2 diabetes mellitus with diabetic chronic kidney disease: Secondary | ICD-10-CM | POA: Diagnosis not present

## 2016-07-10 DIAGNOSIS — N3946 Mixed incontinence: Secondary | ICD-10-CM | POA: Insufficient documentation

## 2016-07-10 DIAGNOSIS — M810 Age-related osteoporosis without current pathological fracture: Secondary | ICD-10-CM | POA: Diagnosis not present

## 2016-07-10 DIAGNOSIS — G4486 Cervicogenic headache: Secondary | ICD-10-CM

## 2016-07-10 DIAGNOSIS — M4312 Spondylolisthesis, cervical region: Secondary | ICD-10-CM | POA: Insufficient documentation

## 2016-07-10 DIAGNOSIS — Z981 Arthrodesis status: Secondary | ICD-10-CM | POA: Insufficient documentation

## 2016-07-10 DIAGNOSIS — J449 Chronic obstructive pulmonary disease, unspecified: Secondary | ICD-10-CM | POA: Insufficient documentation

## 2016-07-10 DIAGNOSIS — L409 Psoriasis, unspecified: Secondary | ICD-10-CM | POA: Diagnosis not present

## 2016-07-10 DIAGNOSIS — M064 Inflammatory polyarthropathy: Secondary | ICD-10-CM | POA: Diagnosis not present

## 2016-07-10 DIAGNOSIS — G8929 Other chronic pain: Secondary | ICD-10-CM

## 2016-07-10 DIAGNOSIS — E785 Hyperlipidemia, unspecified: Secondary | ICD-10-CM | POA: Diagnosis not present

## 2016-07-10 DIAGNOSIS — M47812 Spondylosis without myelopathy or radiculopathy, cervical region: Secondary | ICD-10-CM

## 2016-07-10 DIAGNOSIS — M542 Cervicalgia: Secondary | ICD-10-CM

## 2016-07-10 MED ORDER — DEXAMETHASONE SODIUM PHOSPHATE 10 MG/ML IJ SOLN
10.0000 mg | Freq: Once | INTRAMUSCULAR | Status: AC
Start: 2016-07-10 — End: 2016-07-10
  Administered 2016-07-10: 10 mg
  Filled 2016-07-10: qty 1

## 2016-07-10 MED ORDER — LACTATED RINGERS IV SOLN
1000.0000 mL | Freq: Once | INTRAVENOUS | Status: AC
  Administered 2016-07-10: 1000 mL via INTRAVENOUS

## 2016-07-10 MED ORDER — ROPIVACAINE HCL 2 MG/ML IJ SOLN
9.0000 mL | Freq: Once | INTRAMUSCULAR | Status: AC
  Administered 2016-07-10: 9 mL
  Filled 2016-07-10: qty 10

## 2016-07-10 MED ORDER — LIDOCAINE HCL (PF) 1 % IJ SOLN
10.0000 mL | Freq: Once | INTRAMUSCULAR | Status: AC
  Administered 2016-07-10: 10 mL
  Filled 2016-07-10: qty 10

## 2016-07-10 MED ORDER — FENTANYL CITRATE (PF) 100 MCG/2ML IJ SOLN
25.0000 ug | INTRAMUSCULAR | Status: DC | PRN
  Filled 2016-07-10: qty 2

## 2016-07-10 MED ORDER — MIDAZOLAM HCL 5 MG/5ML IJ SOLN
1.0000 mg | INTRAMUSCULAR | Status: DC | PRN
  Filled 2016-07-10: qty 5

## 2016-07-10 NOTE — Progress Notes (Signed)
Patient's Name: Amber Acosta  Patient type: Established  MRN: [id number]  Service setting: Ambulatory outpatient  DOB: [birth year]  Location: [facility]  DOS: 07/10/2016  Primary Care Physician: Sharyne Peach, MD  Note by: Kathlen Brunswick. Dossie Arbour, M.D, DABA, DABAPM, DABPM, Milagros Evener, FIPP  Referring Physician: Milinda Pointer, MD  Specialty: Board-Certified Interventional Pain Management  Last Visit to Pain Management: 06/23/2016   Primary Reason(s) for Visit: Interventional Pain Management Treatment. CC: Neck Pain (left)  Cervical facet syndrome [M53.82]   Procedure:  Anesthesia, Analgesia, Anxiolysis:  Type: Diagnostic Medial Branch Facet Block Region: Posterolateral Cervical Level: C3, C4, C5, C6, & C7 Medial Branch Level(s) Laterality: Left-Sided Paraspinal  Indications: 1. Cervical facet syndrome   2. Chronic neck pain (Location of Secondary source of pain) (Bilateral) (L>R)   3. Cervicogenic headache (L)     Pre-procedure Pain Score: 3/10 Reported level of pain is compatible with clinical observations Post-procedure Pain Score: 0-No pain  Type: Moderate (Conscious) Sedation & Local Anesthesia Local Anesthetic: Lidocaine 1% Route: Intravenous (IV) IV Access: Secured Sedation: Meaningful verbal contact was maintained at all times during the procedure  Indication(s): Analgesia & Anxiolysis   Pre-Procedure Assessment:  Amber Acosta is a 65 y.o. year old, female patient, seen today for interventional treatment. She has Cervical vertebral fusion; Fibromyalgia; Pain in shoulder; Lumbar facet joint pain; Lumbar facet syndrome (Bilateral) (R>L); Chronic kidney disease; COPD (chronic obstructive pulmonary disease) ([place]); Depression; Type 2 diabetes mellitus ([place]); Hypertensive heart disease with congestive heart failure ([place]); Essential (primary) hypertension; Gall bladder polyp; Acid reflux; Hyperlipemia; Inflammatory polyarthropathy ([place]); Sleep terror; Adiposity;  Arthritis, degenerative; OP (osteoporosis); Psoriasis; Kidney cysts; Muscle weakness (generalized); Sarcoidosis ([place]); Thrombocytopenia ([place]); Vitamin D deficiency; Chronic low back pain (Location of Primary Source of Pain) (Bilateral) (R>L); Chronic neck pain (Location of Secondary source of pain) (Bilateral) (L>R); Cervicogenic headache (L); Cervical facet syndrome; Chronic shoulder pain (Location of Tertiary source of pain) (Bilateral) (L>R); At risk for falls; Balance problems; Chronic pain syndrome; History of closed head injury; Opiate use (22.5 MME/Day); Uncomplicated opioid dependence ([place]); Long term current use of opiate analgesic; Encounter for therapeutic drug level monitoring; Long term prescription opiate use; Spondylolisthesis of cervical region; 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 Her primarily concern today is the Neck Pain (left)   Pain Type: Chronic pain Pain Location: Neck Pain Orientation: Left Pain Descriptors / Indicators:  (feels like lightening going up my neck.) Pain Frequency: Intermittent  Date of Last Visit: 06/23/16 Service Provided on Last Visit: Med Refill  Coagulation Parameters Lab Results  Component Value Date   PLT 146 (L) 03/26/2016    Verification of the correct person, correct site (including marking of site), and correct procedure were performed and confirmed by the patient.  Consent: Secured. Under the influence of no  sedatives a written informed consent was obtained, after having provided information on the risks and possible complications. To fulfill our ethical and legal obligations, as recommended by the American Medical Association's Code of Ethics, we have provided information to the patient about our clinical impression; the nature and purpose of the  treatment or procedure; the risks, benefits, and possible complications of the intervention; alternatives; the risk(s) and benefit(s) of the alternative treatment(s) or procedure(s); and the risk(s) and benefit(s) of doing nothing. The patient was provided information about the risks and possible complications associated with the procedure. These include, but are not limited to, failure to achieve desired goals, infection, bleeding, organ or nerve damage, allergic reactions, paralysis, and death. In the case of spinal procedures these may include, but are not limited to, failure to achieve desired goals, infection, bleeding, organ or nerve damage, allergic reactions, paralysis, and death. In the case of intra- or periarticular procedures these may include, but are not limited to, failure to achieve desired goals, infection, bleeding (hemarthrosis), organ or nerve damage, allergic reactions, and death. In addition, the patient was informed that Medicine is not an exact science; therefore, there is also the possibility of unforeseen risks and possible complications that may result in a catastrophic outcome. The patient indicated having understood very clearly. We have given the patient no guarantees and we have made no promises. Enough time was given to the patient to ask questions, all of which were answered to the patient's satisfaction.  Consent Attestation: I, the ordering provider, attest that I have discussed with the patient the benefits, risks, side-effects, alternatives, likelihood of achieving goals, and potential problems during recovery for the procedure that I  have provided informed consent.  Pre-Procedure Preparation: Safety Precautions: Allergies reviewed. Appropriate site, procedure, and patient were confirmed by following the Joint Commission's Universal Protocol (UP.01.01.01), in the form of a "Time Out". The patient was asked to confirm marked site and procedure, before commencing. The patient was asked about blood thinners, or active infections, both of which were denied. Patient was assessed for positional comfort and all pressure points were checked before starting procedure. Allergies: She is allergic to mushroom extract complex; enbrel [etanercept]; mtx support [cobalamine combinations]; darvon [propoxyphene]; and minocycline.. Infection Control Precautions: Sterile technique used. Standard Universal Precautions were taken as recommended by the Department of [facility] for Disease Control and Prevention (CDC). Standard pre-surgical skin prep was conducted. Respiratory hygiene and cough etiquette was practiced. Hand hygiene observed. Safe injection practices and needle disposal techniques followed. SDV (single dose vial) medications used. Medications properly checked for expiration dates and contaminants. Personal protective equipment (PPE) used: Sterile Radiation-resistant gloves. Monitoring:  As per clinic protocol. Vitals:   07/10/16 1322 07/10/16 1332 07/10/16 1342 07/10/16 1352  BP: (!) 85/49 (!) 88/49 (!) 99/52 (!) 114/58  Pulse: (!) 56 60 62 63  Resp: (!) '8 16 20 14  '$ Temp: (!) 96.6 F (35.9 C)   97 F (36.1 C)  SpO2: 92% 93% 93% 96%  Weight:      Height:      Calculated BMI: Body mass index is 38.97 kg/m.  Description of Procedure Process:   Time-out: "Time-out" completed before starting procedure, as per protocol. Position: Prone with head of the table was raised to facilitate breathing. Target Area: For Cervical Facet blocks, the target is the postero-lateral waist of the articular pillars at the C3, C4, C5, C6, & C7  levels. Approach: Posterior approach. Area Prepped: Entire PosteriorCervicothoracic Region Prepping solution: ChloraPrep (2% chlorhexidine gluconate and 70% isopropyl alcohol) Safety Precautions: Aspiration looking for blood return was conducted prior to all injections. At no point did we inject any substances, as a needle was being advanced. No attempts were made at seeking any paresthesias. Safe injection practices and needle disposal techniques used. Medications properly  checked for expiration dates. SDV (single dose vial) medications used.   Description of the Procedure: Protocol guidelines were followed. The patient was placed in position over the fluoroscopy table. The target area was identified and the area prepped in the usual manner. Skin desensitized using vapocoolant spray. Skin & deeper tissues infiltrated with local anesthetic. Appropriate amount of time allowed to pass for local anesthetics to take effect. The procedure needle was introduced through the skin, ipsilateral to the reported pain, and advanced to the target area. Bone was contacted on the posterior aspect of the articular pillars and the needle walked lateral, until the border was cleared. Lateral views taken to make sure the needle tip did not advance past the posterior third of the lateral mass of the posterior columns. The procedure was repeated in identical fashion for each level. Negative aspiration confirmed. Solution injected in intermittent fashion, asking for systemic symptoms every 0.5cc of injectate. The needles were then removed and the area cleansed, making sure to leave some of the prepping solution back to take advantage of its long term bactericidal properties. EBL: None Materials & Medications Used:  Needle(s) Used: 22g - 3.5" Spinal Needle(s) Medication(s): Please see chart orders for medication and dosing details.  Imaging Guidance:  Type of Imaging Technique: Fluoroscopy Guidance (Spinal) Indication(s):  Assistance in needle guidance and placement for procedures requiring needle placement in or near specific anatomical locations not easily accessible without such assistance. Exposure Time: Please see nurses notes. Contrast: None used. Fluoroscopic Guidance: I was personally present in the fluoroscopy suite, where the patient was placed in position for the procedure, over the fluoroscopy-compatible table. Fluoroscopy was manipulated, using "Tunnel Vision Technique", to obtain the best possible view of the target area, on the affected side. Parallax error was corrected before commencing the procedure. A "direction-depth-direction" technique was used to introduce the needle under continuous pulsed fluoroscopic guidance. Once the target was reached, antero-posterior, oblique, and lateral fluoroscopic projection views were taken to confirm needle placement in all planes. Permanently recorded images stored by scanning into EMR. Interpretation: Intraoperative imaging interpretation by performing Physician. Adequate needle placement confirmed. Adequate needle placement confirmed in AP, lateral, & Oblique Views. No contrast injected.  Antibiotic Prophylaxis:  Indication(s): No indications identified. Type:  Antibiotics Given (last 72 hours)    None       Post-operative Assessment:  Complications: No immediate post-treatment complications were observed. Relevant Post-operative Information:  Disposition: Return to clinic for follow-up evaluation. The patient tolerated the entire procedure well. A repeat set of vitals were taken after the procedure and the patient was kept under observation following institutional policy, for this procedure. Post-procedural neurological assessment was performed, showing return to baseline, prior to discharge. The patient was discharged home, once institutional criteria were met. The patient was provided with post-procedure discharge instructions, including a section on how to  identify potential problems. Should any problems arise concerning this procedure, the patient was given instructions to immediately contact us, at any time, without hesitation. In any case, we plan to contact the patient by telephone for a follow-up status report regarding this interventional procedure. Comments:  No additional relevant information.  Medications administered during this visit: We administered lactated ringers, dexamethasone, lidocaine (PF), and ropivacaine (PF) 2 mg/ml (0.2%).  Prescriptions ordered during this visit: Meds ordered this encounter  Medications  . fentaNYL (SUBLIMAZE) injection 25-50 mcg    Make sure Narcan is available in the pyxis when using this medication. In the event of respiratory depression (RR< 8/min):  Titrate NARCAN (naloxone) in increments of 0.1 to 0.2 mg IV at 2-3 minute intervals, until desired degree of reversal.  . lactated ringers infusion 1,000 mL  . midazolam (VERSED) 5 MG/5ML injection 1-2 mg    Make sure Flumazenil is available in the pyxis when using this medication. If oversedation occurs, administer 0.2 mg IV over 15 sec. If after 45 sec no response, administer 0.2 mg again over 1 min; may repeat at 1 min intervals; not to exceed 4 doses (1 mg)  . dexamethasone (DECADRON) injection 10 mg  . lidocaine (PF) (XYLOCAINE) 1 % injection 10 mL  . ropivacaine (PF) 2 mg/ml (0.2%) (NAROPIN) epidural 9 mL    Future Appointments Date Time Provider [place]  07/30/2016 9:20 AM Milinda Pointer, MD [facility] None  09/15/2016 8:00 AM Milinda Pointer, MD [facility] None    Primary Care Physician: Sharyne Peach, MD Location: [facility] Note by: Kathlen Brunswick. Dossie Arbour, M.D, DABA, DABAPM, DABPM, DABIPP, FIPP  Disclaimer:  Medicine is not an exact science. The only guarantee in medicine is that nothing is guaranteed. It is important to note that the decision to proceed with this intervention was based on the  information collected from the patient. The Data and conclusions were drawn from the patient's questionnaire, the interview, and the physical examination. Because the information was provided in large part by the patient, it cannot be guaranteed that it has not been purposely or unconsciously manipulated. Every effort has been made to obtain as much relevant data as possible for this evaluation. It is important to note that the conclusions that lead to this procedure are derived in large part from the available data. Always take into account that the treatment will also be dependent on availability of resources and existing treatment guidelines, considered by other Pain Management Practitioners as being common knowledge and practice, at the time of the intervention. For Medico-Legal purposes, it is also important to point out that variation in procedural techniques and pharmacological choices are the acceptable norm. The indications, contraindications, technique, and results of the above procedure should only be interpreted and judged by a Board-Certified Interventional Pain Specialist with extensive familiarity and expertise in the same exact procedure and technique. Attempts at providing opinions without similar or greater experience and expertise than that of the treating physician will be considered as inappropriate and unethical, and shall result in a formal complaint to the state medical board and applicable specialty societies.

## 2016-07-10 NOTE — Patient Instructions (Signed)
Facet Blocks Patient Information  Description: The facets are joints in the spine between the vertebrae.  Like any joints in the body, facets can become irritated and painful.  Arthritis can also effect the facets.  By injecting steroids and local anesthetic in and around these joints, we can temporarily block the nerve supply to them.  Steroids act directly on irritated nerves and tissues to reduce selling and inflammation which often leads to decreased pain.  Facet blocks may be done anywhere along the spine from the neck to the low back depending upon the location of your pain.   After numbing the skin with local anesthetic (like Novocaine), a small needle is passed onto the facet joints under x-ray guidance.  You may experience a sensation of pressure while this is being done.  The entire block usually lasts about 15-25 minutes.   Conditions which may be treated by facet blocks:   Low back/buttock pain  Neck/shoulder pain  Certain types of headaches  Preparation for the injection:  1. Do not eat any solid food or dairy products within 8 hours of your appointment. 2. You may drink clear liquid up to 3 hours before appointment.  Clear liquids include water, black coffee, juice or soda.  No milk or cream please. 3. You may take your regular medication, including pain medications, with a sip of water before your appointment.  Diabetics should hold regular insulin (if taken separately) and take 1/2 normal NPH dose the morning of the procedure.  Carry some sugar containing items with you to your appointment. 4. A driver must accompany you and be prepared to drive you home after your procedure. 5. Bring all your current medications with you. 6. An IV may be inserted and sedation may be given at the discretion of the physician. 7. A blood pressure cuff, EKG and other monitors will often be applied during the procedure.  Some patients may need to have extra oxygen administered for a short  period. 8. You will be asked to provide medical information, including your allergies and medications, prior to the procedure.  We must know immediately if you are taking blood thinners (like Coumadin/Warfarin) or if you are allergic to IV iodine contrast (dye).  We must know if you could possible be pregnant.  Possible side-effects:   Bleeding from needle site  Infection (rare, may require surgery)  Nerve injury (rare)  Numbness & tingling (temporary)  Difficulty urinating (rare, temporary)  Spinal headache (a headache worse with upright posture)  Light-headedness (temporary)  Pain at injection site (serveral days)  Decreased blood pressure (rare, temporary)  Weakness in arm/leg (temporary)  Pressure sensation in back/neck (temporary)   Call if you experience:   Fever/chills associated with headache or increased back/neck pain  Headache worsened by an upright position  New onset, weakness or numbness of an extremity below the injection site  Hives or difficulty breathing (go to the emergency room)  Inflammation or drainage at the injection site(s)  Severe back/neck pain greater than usual  New symptoms which are concerning to you  Please note:  Although the local anesthetic injected can often make your back or neck feel good for several hours after the injection, the pain will likely return. It takes 3-7 days for steroids to work.  You may not notice any pain relief for at least one week.  If effective, we will often do a series of 2-3 injections spaced 3-6 weeks apart to maximally decrease your pain.  After the initial   series, you may be a candidate for a more permanent nerve block of the facets.  If you have any questions, please call [phone] [facility] Dis[facility] Inst[facility]  General Discharge Instructions :  If you need to reach your doctor call: Monday-Friday 8:00 am - 4:00 pm at [phone] or  toll free [phone].  After clinic hours [phone] to have operator reach doctor.  Bring all of your medication bottles to all your appointments in the pain clinic.  To cancel or reschedule your appointment with Pain Management please remember to call 24 hours in advance to avoid a fee.  Refer to the educational materials which you have been given on: General Risks, I had my Procedure. Discharge Instructions, Post Sedation.  Post Procedure Instructions:  The drugs you were given will stay in your system until tomorrow, so for the next 24 hours you should not drive, make any legal decisions or drink any alcoholic beverages.  You may eat anything you prefer, but it is better to start with liquids then soups and crackers, and gradually work up to solid foods.  Please notify your doctor immediately if you have any unusual bleeding, trouble breathing or pain that is not related to your normal pain.  Depending on the type of procedure that was done, some parts of your body may feel week and/or numb.  This usually clears up by tonight or the next day.  Walk with the use of an assistive device or accompanied by an adult for the 24 hours.  You may use ice on the affected area for the first 24 hours.  Put ice in a Ziploc bag and cover with a towel and place against area 15 minutes on 15 minutes off.  You may switch to heat after 24 hours.GENERAL RISKS AND COMPLICATIONS  What are the risk, side effects and possible complications? Generally speaking, most procedures are safe.  However, with any procedure there are risks, side effects, and the possibility of complications.  The risks and complications are dependent upon the sites that are lesioned, or the type of nerve block to be performed.  The closer the procedure is to the spine, the more serious the risks are.  Great care is taken when placing the radio frequency needles, block needles or lesioning probes, but sometimes complications can  occur. 1. Infection: Any time there is an injection through the skin, there is a risk of infection.  This is why sterile conditions are used for these blocks.  There are four possible types of infection. 1. Localized skin infection. 2. Central Nervous System Infection-This can be in the form of Meningitis, which can be deadly. 3. Epidural Infections-This can be in the form of an epidural abscess, which can cause pressure inside of the spine, causing compression of the spinal cord with subsequent paralysis. This would require an emergency surgery to decompress, and there are no guarantees that the patient would recover from the paralysis. 4. Discitis-This is an infection of the intervertebral discs.  It occurs in about 1% of discography procedures.  It is difficult to treat and it may lead to surgery.        2. Pain: the needles have to go through skin and soft tissues, will cause soreness.       3. Damage to internal structures:  The nerves to be lesioned may be near blood vessels or    other nerves which can be potentially damaged.         4. Bleeding: Bleeding is more common if the patient is taking blood thinners such as  aspirin, Coumadin, Ticiid, Plavix, etc., or if he/she have some genetic predisposition  such as hemophilia. Bleeding into the spinal canal can cause compression of the spinal  cord with subsequent paralysis.  This would require an emergency surgery to  decompress and there are no guarantees that the patient would recover from the  paralysis.       5. Pneumothorax:  Puncturing of a lung is a possibility, every time a needle is introduced in  the area of the chest or upper back.  Pneumothorax refers to free air around the  collapsed lung(s), inside of the thoracic cavity (chest cavity).  Another two possible  complications related to a similar event would include: Hemothorax and Chylothorax.   These are variations of the Pneumothorax, where instead of air around the collapsed  lung(s),  you may have blood or chyle, respectively.       6. Spinal headaches: They may occur with any procedures in the area of the spine.       7. Persistent CSF (Cerebro-Spinal Fluid) leakage: This is a rare problem, but may occur  with prolonged intrathecal or epidural catheters either due to the formation of a fistulous  track or a dural tear.       8. Nerve damage: By working so close to the spinal cord, there is always a possibility of  nerve damage, which could be as serious as a permanent spinal cord injury with  paralysis.       9. Death:  Although rare, severe deadly allergic reactions known as "Anaphylactic  reaction" can occur to any of the medications used.      10. Worsening of the symptoms:  We can always make thing worse.  What are the chances of something like this happening? Chances of any of this occuring are extremely low.  By statistics, you have more of a chance of getting killed in a motor vehicle accident: while driving to the hospital than any of the above occurring .  Nevertheless, you should be aware that they are possibilities.  In general, it is similar to taking a shower.  Everybody knows that you can slip, hit your head and get killed.  Does that mean that you should not shower again?  Nevertheless always keep in mind that statistics do not mean anything if you happen to be on the wrong side of them.  Even if a procedure has a 1 (one) in a 1,000,000 (million) chance of going wrong, it you happen to be that one..Also, keep in mind that by statistics, you have more of a chance of having something go wrong when taking medications.  Who should not have this procedure? If you are on a blood thinning medication (e.g. Coumadin, Plavix, see list of "Blood Thinners"), or if you have an active infection going on, you should not have the procedure.  If you are taking any blood thinners, please inform your physician.  How should I prepare for this procedure?  Do not eat or drink anything at  least six hours prior to the procedure.  Bring a driver with you .  It cannot be a taxi.  Come accompanied by an adult that can drive you back, and that is strong enough to help you if your legs get weak or numb from the local anesthetic.  Take all of your medicines the morning of the procedure with just enough water   to swallow them.  If you have diabetes, make sure that you are scheduled to have your procedure done first thing in the morning, whenever possible.  If you have diabetes, take only half of your insulin dose and notify our nurse that you have done so as soon as you arrive at the clinic.  If you are diabetic, but only take blood sugar pills (oral hypoglycemic), then do not take them on the morning of your procedure.  You may take them after you have had the procedure.  Do not take aspirin or any aspirin-containing medications, at least eleven (11) days prior to the procedure.  They may prolong bleeding.  Wear loose fitting clothing that may be easy to take off and that you would not mind if it got stained with Betadine or blood.  Do not wear any jewelry or perfume  Remove any nail coloring.  It will interfere with some of our monitoring equipment.  NOTE: Remember that this is not meant to be interpreted as a complete list of all possible complications.  Unforeseen problems may occur.  BLOOD THINNERS The following drugs contain aspirin or other products, which can cause increased bleeding during surgery and should not be taken for 2 weeks prior to and 1 week after surgery.  If you should need take something for relief of minor pain, you may take acetaminophen which is found in Tylenol,m Datril, Anacin-3 and Panadol. It is not blood thinner. The products listed below are.  Do not take any of the products listed below in addition to any listed on your instruction sheet.  A.P.C or A.P.C with Codeine Codeine Phosphate Capsules #3 Ibuprofen Ridaura  ABC compound Congesprin Imuran  rimadil  Advil Cope Indocin Robaxisal  Alka-Seltzer Effervescent Pain Reliever and Antacid Coricidin or Coricidin-D  Indomethacin Rufen  Alka-Seltzer plus Cold Medicine Cosprin Ketoprofen S-A-C Tablets  Anacin Analgesic Tablets or Capsules Coumadin Korlgesic Salflex  Anacin Extra Strength Analgesic tablets or capsules CP-2 Tablets Lanoril Salicylate  Anaprox Cuprimine Capsules Levenox Salocol  Anexsia-D Dalteparin Magan Salsalate  Anodynos Darvon compound Magnesium Salicylate Sine-off  Ansaid Dasin Capsules Magsal Sodium Salicylate  Anturane Depen Capsules Marnal Soma  APF Arthritis pain formula Dewitt's Pills Measurin Stanback  Argesic Dia-Gesic Meclofenamic Sulfinpyrazone  Arthritis Bayer Timed Release Aspirin Diclofenac Meclomen Sulindac  Arthritis pain formula Anacin Dicumarol Medipren Supac  Analgesic (Safety coated) Arthralgen Diffunasal Mefanamic Suprofen  Arthritis Strength Bufferin Dihydrocodeine Mepro Compound Suprol  Arthropan liquid Dopirydamole Methcarbomol with Aspirin Synalgos  ASA tablets/Enseals Disalcid Micrainin Tagament  Ascriptin Doan's Midol Talwin  Ascriptin A/D Dolene Mobidin Tanderil  Ascriptin Extra Strength Dolobid Moblgesic Ticlid  Ascriptin with Codeine Doloprin or Doloprin with Codeine Momentum Tolectin  Asperbuf Duoprin Mono-gesic Trendar  Aspergum Duradyne Motrin or Motrin IB Triminicin  Aspirin plain, buffered or enteric coated Durasal Myochrisine Trigesic  Aspirin Suppositories Easprin Nalfon Trillsate  Aspirin with Codeine Ecotrin Regular or Extra Strength Naprosyn Uracel  Atromid-S Efficin Naproxen Ursinus  Auranofin Capsules Elmiron Neocylate Vanquish  Axotal Emagrin Norgesic Verin  Azathioprine Empirin or Empirin with Codeine Normiflo Vitamin E  Azolid Emprazil Nuprin Voltaren  Bayer Aspirin plain, buffered or children's or timed BC Tablets or powders Encaprin Orgaran Warfarin Sodium  Buff-a-Comp Enoxaparin Orudis Zorpin  Buff-a-Comp with  Codeine Equegesic Os-Cal-Gesic   Buffaprin Excedrin plain, buffered or Extra Strength Oxalid   Bufferin Arthritis Strength Feldene Oxphenbutazone   Bufferin plain or Extra Strength Feldene Capsules Oxycodone with Aspirin   Bufferin with Codeine Fenoprofen Fenoprofen Pabalate or Pabalate-SF     Buffets II Flogesic Panagesic   Buffinol plain or Extra Strength Florinal or Florinal with Codeine Panwarfarin   Buf-Tabs Flurbiprofen Penicillamine   Butalbital Compound Four-way cold tablets Penicillin   Butazolidin Fragmin Pepto-Bismol   Carbenicillin Geminisyn Percodan   Carna Arthritis Reliever Geopen Persantine   Carprofen Gold's salt Persistin   Chloramphenicol Goody's Phenylbutazone   Chloromycetin Haltrain Piroxlcam   Clmetidine heparin Plaquenil   Cllnoril Hyco-pap Ponstel   Clofibrate Hydroxy chloroquine Propoxyphen         Before stopping any of these medications, be sure to consult the physician who ordered them.  Some, such as Coumadin (Warfarin) are ordered to prevent or treat serious conditions such as "deep thrombosis", "pumonary embolisms", and other heart problems.  The amount of time that you may need off of the medication may also vary with the medication and the reason for which you were taking it.  If you are taking any of these medications, please make sure you notify your pain physician before you undergo any procedures.          

## 2016-07-10 NOTE — Progress Notes (Signed)
Patient was given Fent iv and versed 3 mg

## 2016-07-10 NOTE — Progress Notes (Signed)
Safety precautions to be maintained throughout the outpatient stay will include: orient to surroundings, keep bed in low position, maintain call bell within reach at all times, provide assistance with transfer out of bed and ambulation.  

## 2016-07-11 ENCOUNTER — Telehealth: Payer: Self-pay

## 2016-07-11 NOTE — Telephone Encounter (Signed)
Post procedure phone call. States she is doing fine. 

## 2016-07-30 ENCOUNTER — Ambulatory Visit: Payer: Medicare Other | Attending: Pain Medicine | Admitting: Pain Medicine

## 2016-07-30 ENCOUNTER — Encounter: Payer: Self-pay | Admitting: Pain Medicine

## 2016-07-30 VITALS — BP 151/57 | HR 59 | Temp 98.6°F | Resp 16 | Ht 63.0 in | Wt 220.0 lb

## 2016-07-30 DIAGNOSIS — E669 Obesity, unspecified: Secondary | ICD-10-CM | POA: Diagnosis not present

## 2016-07-30 DIAGNOSIS — D696 Thrombocytopenia, unspecified: Secondary | ICD-10-CM | POA: Diagnosis not present

## 2016-07-30 DIAGNOSIS — M47812 Spondylosis without myelopathy or radiculopathy, cervical region: Secondary | ICD-10-CM | POA: Diagnosis not present

## 2016-07-30 DIAGNOSIS — M064 Inflammatory polyarthropathy: Secondary | ICD-10-CM | POA: Diagnosis not present

## 2016-07-30 DIAGNOSIS — Z9889 Other specified postprocedural states: Secondary | ICD-10-CM | POA: Insufficient documentation

## 2016-07-30 DIAGNOSIS — R51 Headache: Secondary | ICD-10-CM | POA: Diagnosis present

## 2016-07-30 DIAGNOSIS — I130 Hypertensive heart and chronic kidney disease with heart failure and stage 1 through stage 4 chronic kidney disease, or unspecified chronic kidney disease: Secondary | ICD-10-CM | POA: Insufficient documentation

## 2016-07-30 DIAGNOSIS — G4486 Cervicogenic headache: Secondary | ICD-10-CM

## 2016-07-30 DIAGNOSIS — M6281 Muscle weakness (generalized): Secondary | ICD-10-CM | POA: Diagnosis not present

## 2016-07-30 DIAGNOSIS — M5382 Other specified dorsopathies, cervical region: Secondary | ICD-10-CM

## 2016-07-30 DIAGNOSIS — E1122 Type 2 diabetes mellitus with diabetic chronic kidney disease: Secondary | ICD-10-CM | POA: Insufficient documentation

## 2016-07-30 DIAGNOSIS — M810 Age-related osteoporosis without current pathological fracture: Secondary | ICD-10-CM | POA: Diagnosis not present

## 2016-07-30 DIAGNOSIS — Z79891 Long term (current) use of opiate analgesic: Secondary | ICD-10-CM | POA: Insufficient documentation

## 2016-07-30 DIAGNOSIS — N3946 Mixed incontinence: Secondary | ICD-10-CM | POA: Insufficient documentation

## 2016-07-30 DIAGNOSIS — I5032 Chronic diastolic (congestive) heart failure: Secondary | ICD-10-CM | POA: Insufficient documentation

## 2016-07-30 DIAGNOSIS — M4322 Fusion of spine, cervical region: Secondary | ICD-10-CM | POA: Diagnosis not present

## 2016-07-30 DIAGNOSIS — Z6839 Body mass index (BMI) 39.0-39.9, adult: Secondary | ICD-10-CM | POA: Insufficient documentation

## 2016-07-30 DIAGNOSIS — G8929 Other chronic pain: Secondary | ICD-10-CM | POA: Insufficient documentation

## 2016-07-30 DIAGNOSIS — F329 Major depressive disorder, single episode, unspecified: Secondary | ICD-10-CM | POA: Diagnosis not present

## 2016-07-30 DIAGNOSIS — N281 Cyst of kidney, acquired: Secondary | ICD-10-CM | POA: Insufficient documentation

## 2016-07-30 DIAGNOSIS — K824 Cholesterolosis of gallbladder: Secondary | ICD-10-CM | POA: Insufficient documentation

## 2016-07-30 DIAGNOSIS — J449 Chronic obstructive pulmonary disease, unspecified: Secondary | ICD-10-CM | POA: Insufficient documentation

## 2016-07-30 DIAGNOSIS — R7889 Finding of other specified substances, not normally found in blood: Secondary | ICD-10-CM | POA: Insufficient documentation

## 2016-07-30 DIAGNOSIS — M545 Low back pain: Secondary | ICD-10-CM | POA: Insufficient documentation

## 2016-07-30 DIAGNOSIS — K219 Gastro-esophageal reflux disease without esophagitis: Secondary | ICD-10-CM | POA: Diagnosis not present

## 2016-07-30 DIAGNOSIS — M797 Fibromyalgia: Secondary | ICD-10-CM | POA: Diagnosis not present

## 2016-07-30 DIAGNOSIS — Z6835 Body mass index (BMI) 35.0-35.9, adult: Secondary | ICD-10-CM | POA: Insufficient documentation

## 2016-07-30 DIAGNOSIS — E785 Hyperlipidemia, unspecified: Secondary | ICD-10-CM | POA: Diagnosis not present

## 2016-07-30 DIAGNOSIS — E559 Vitamin D deficiency, unspecified: Secondary | ICD-10-CM | POA: Diagnosis not present

## 2016-07-30 DIAGNOSIS — D869 Sarcoidosis, unspecified: Secondary | ICD-10-CM | POA: Diagnosis not present

## 2016-07-30 DIAGNOSIS — Z7982 Long term (current) use of aspirin: Secondary | ICD-10-CM | POA: Insufficient documentation

## 2016-07-30 DIAGNOSIS — M542 Cervicalgia: Secondary | ICD-10-CM | POA: Insufficient documentation

## 2016-07-30 DIAGNOSIS — F514 Sleep terrors [night terrors]: Secondary | ICD-10-CM | POA: Diagnosis not present

## 2016-07-30 DIAGNOSIS — M452 Ankylosing spondylitis of cervical region: Secondary | ICD-10-CM | POA: Diagnosis not present

## 2016-07-30 DIAGNOSIS — L409 Psoriasis, unspecified: Secondary | ICD-10-CM | POA: Insufficient documentation

## 2016-07-30 DIAGNOSIS — Z794 Long term (current) use of insulin: Secondary | ICD-10-CM | POA: Insufficient documentation

## 2016-07-30 DIAGNOSIS — M79641 Pain in right hand: Secondary | ICD-10-CM | POA: Diagnosis present

## 2016-07-30 DIAGNOSIS — Z87891 Personal history of nicotine dependence: Secondary | ICD-10-CM | POA: Insufficient documentation

## 2016-07-30 DIAGNOSIS — M25512 Pain in left shoulder: Secondary | ICD-10-CM | POA: Insufficient documentation

## 2016-07-30 DIAGNOSIS — D589 Hereditary hemolytic anemia, unspecified: Secondary | ICD-10-CM | POA: Insufficient documentation

## 2016-07-30 DIAGNOSIS — Z90710 Acquired absence of both cervix and uterus: Secondary | ICD-10-CM | POA: Insufficient documentation

## 2016-07-30 DIAGNOSIS — M4312 Spondylolisthesis, cervical region: Secondary | ICD-10-CM | POA: Insufficient documentation

## 2016-07-30 DIAGNOSIS — M25511 Pain in right shoulder: Secondary | ICD-10-CM | POA: Insufficient documentation

## 2016-07-30 DIAGNOSIS — M79642 Pain in left hand: Secondary | ICD-10-CM | POA: Diagnosis present

## 2016-07-30 DIAGNOSIS — N189 Chronic kidney disease, unspecified: Secondary | ICD-10-CM | POA: Insufficient documentation

## 2016-07-30 NOTE — Progress Notes (Signed)
Patient's Name: Amber Acosta  Patient type: Established  MRN: [id number]  Service setting: Ambulatory outpatient  DOB: 02/28/1951  Location: [facility]  DOS: 07/30/2016  Primary Care Physician: Rayetta HumphreyGeorge, Sionne A, MD  Note by: Sydnee LevansFrancisco A. Laban EmperorNaveira, M.D, DABA, DABAPM, DABPM, Olga CoasterABIPP, FIPP  Referring Physician: Rayetta HumphreyGeorge, Sionne A, MD  Specialty: Board-Certified Interventional Pain Management  Last Visit to Pain Management: 07/11/2016   Primary Reason(s) for Visit: Encounter for prescription drug management & post-procedure evaluation of chronic illness with mild to moderate exacerbation(Level of risk: moderate) CC: Headache (no pain today) and Hand Pain (both)   HPI  Amber Acosta is a 65 y.o. year old, female patient, who returns today as an established patient. She has Cervical vertebral fusion; Fibromyalgia; Lumbar facet joint pain; Lumbar facet syndrome (Bilateral) (R>L); Chronic kidney disease; COPD (chronic obstructive pulmonary disease) (HCC); Depression; Type 2 diabetes mellitus (HCC); Hypertensive heart disease with congestive heart failure (HCC); Gall bladder polyp; GERD (gastroesophageal reflux disease); Hyperlipemia; Inflammatory polyarthropathy (HCC); Sleep terror; Obesity, Class II, BMI 35-39.9, with comorbidity (HCC); OP (osteoporosis); Psoriasis; Kidney cysts; Muscle weakness (generalized); Sarcoidosis (HCC); Thrombocytopenia (HCC); Vitamin D deficiency; Chronic low back pain (Location of Primary Source of Pain) (Bilateral) (R>L); Chronic neck pain (Location of Secondary source of pain) (Bilateral) (L>R); Cervicogenic headache (L); Cervical facet syndrome (Left); Chronic shoulder pain (Location of Tertiary source of pain) (Bilateral) (L>R); At risk for falls; Balance problems; History of closed head injury; Opiate use (22.5 MME/Day); Long term current use of opiate analgesic; Encounter for therapeutic drug level monitoring; Long term prescription opiate use; Spondylolisthesis  of cervical region; 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HCC); Hx of cervical spine surgery; Low magnesium levels; Myofascial pain; Chronic pain; Neuropathy (HCC); Benign paroxysmal positional vertigo; Diabetic peripheral neuropathy (HCC); Meralgia paresthetica (Right); Cervical spondylosis; Chronic diastolic congestive heart failure (HCC); Mixed stress and urge urinary incontinence; Overactive detrusor; and 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 Her primarily concern today is the Headache (no pain today) and Hand Pain (both)   Pain Assessment: Self-Reported Pain Score: 0-No pain             Reported level is compatible with observation       Pain Type: Chronic pain Pain Location: Head Pain Orientation:  (no pain today x hand pain from arthritis) Pain Frequency: Intermittent  The patient comes into the clinics today for post-procedure evaluation on the interventional treatment done on 07/10/2016. In addition, she comes in today for pharmacological management of her chronic pain.  The patient  reports that she does not use drugs. On 07/10/2016 the patient had a diagnostic left cervical facet block done under fluoroscopic guidance and IV sedation which provided her with 100% relief of the pain. This confirms that the majority of the pain in the cervical region is coming from the facet joints. This also means that she may be a very good candidate for radiofrequency ablation.  Date of Last Visit: 07/10/16 Service Provided on Last Visit: Procedure ( left cervical facet)  Controlled Substance Pharmacotherapy Assessment & REMS (Risk Evaluation and Mitigation Strategy)  Analgesic:Oxycodone IR 5 mg every 8 hours (15 mg/day) MME/day:22.5 mg/day  Pill Count: The patient does not need a refill today. She has enough medication to last until 09/27/2016. Pharmacokinetics: Onset of action (Liberation/Absorption): Within expected pharmacological parameters Time to Peak effect (Distribution): Timing  and results are as within normal expected parameters Duration of action (Metabolism/Excretion): Within normal limits for medication Pharmacodynamics: Analgesic Effect: More than 50%  Activity Facilitation: Medication(s) allow patient to sit, stand, walk, and do the basic ADLs Perceived Effectiveness: Described as relatively effective, allowing for increase in activities of daily living (ADL) Side-effects or Adverse reactions: None reported Monitoring: [place] PMP: Online review of the past 77-month period conducted. Compliant with practice rules and regulations Last UDS on record: Tox[employer] Select 13  Date Value Ref Range Status  03/26/2016 FINAL  Final    Comment:    ==================================================================== TOXASSURE SELECT 13 (MW) ==================================================================== Test                             Result       Flag       Units Drug Present and Declared for Prescription Verification   Oxycodone                      3692         EXPECTED   ng/mg creat   Noroxycodone                   3517         EXPECTED   ng/mg creat    Sources of oxycodone include scheduled prescription medications.    Noroxycodone is an expected metabolite of oxycodone. ==================================================================== Test                      Result    Flag   Units      Ref Range   Creatinine              59               mg/dL      >=16 ==================================================================== Declared Medications:  The flagging and interpretation on this report are based on the  following declared medications.  Unexpected results may arise from  inaccuracies in the declared medications.  **Note: The testing scope of this panel includes these medications:  Oxycodone (Oxy-IR)  **Note: The testing scope of this panel does not include following  reported medications:  Amlodipine (Norvasc)  Diclofenac (Voltaren)  Fluoxetine  (Prozac)  Furosemide (Lasix)  Insulin (Humulin)  Iron (Ferrous Sulfate)  Lovastatin (Mevacor)  Magnesium (Mag-Ox)  Metformin (Glumetza)  Metoprolol (Lopressor)  Potassium (K-Dur)  Pregabalin (Lyrica)  Tizanidine (Zanaflex)  Trazodone (Desyrel) ==================================================================== For clinical consultation, please call [phone]. ====================================================================    UDS interpretation: Compliant          Medication Assessment Form: Reviewed. Patient indicates being compliant with therapy Treatment compliance: Compliant Risk Assessment: Aberrant Behavior: None observed today Substance Use Disorder (SUD) Risk Level: Low-to-moderate Risk of opioid abuse or dependence: 0.7-3.0% with doses ? 36 MME/day and 6.1-26% with doses ? 120 MME/day. Opioid Risk Tool (ORT) Score: Total Score: 6 Moderate Risk for SUD (Score between 4-7) Depression Scale Score: PHQ-2: PHQ-2 Total Score: 0 No depression (0) PHQ-9: PHQ-9 Total Score: 0 No depression (0-4)  Pharmacologic Plan: No change in therapy, at this time  Post-Procedure Assessment  Procedure done on last visit: Diagnostic left sided Cervical facet block under fluoroscopic guidance and IV sedation. Side-effects or Adverse reactions: None reported Sedation: Sedation given  Results: Ultra-Short Term Relief (First 1 hour after procedure): 100 %  Analgesia during this period is likely to be Local Anesthetic and/or IV Sedative (Analgesic/Anxiolitic) related Short Term Relief (Initial 4-6 hrs after procedure): 100 % Complete relief confirms area to be the source of pain Long  Term Relief : 100 % (good results with procedure) Long-term benefit would suggest an inflammatory etiology to the pain         Current Relief (Now): 100%  Persistent relief would suggest effective anti-inflammatory effects from steroids Interpretation of Results: The results of this diagnostic injection  prove the cervical facets to be involved in the patient's etiology of her pain.  Laboratory Chemistry  Inflammation Markers Lab Results  Component Value Date   ESRSEDRATE 52 (H) 03/26/2016   CRP 2.1 (H) 03/26/2016    Renal Function Lab Results  Component Value Date   BUN 14 03/26/2016   CREATININE 1.28 (H) 03/26/2016   GFRAA 50 (L) 03/26/2016   GFRNONAA 44 (L) 03/26/2016    Hepatic Function Lab Results  Component Value Date   AST 24 03/26/2016   ALT 20 03/26/2016   ALBUMIN 4.0 03/26/2016    Electrolytes Lab Results  Component Value Date   NA 136 03/26/2016   K 3.8 03/26/2016   CL 100 (L) 03/26/2016   CALCIUM 8.5 (L) 03/26/2016   MG 1.8 03/26/2016    Pain Modulating Vitamins Lab Results  Component Value Date   VD25OH 20.2 (L) 03/26/2016   VITAMINB12 467 03/26/2016    Coagulation Parameters Lab Results  Component Value Date   PLT 146 (L) 03/26/2016    Cardiovascular No results found for: BNP, HGB, HCT  Note: Lab results reviewed.  Recent Diagnostic Imaging  No results found.  Meds  The patient has a current medication list which includes the following prescription(s): albuterol, amlodipine, aspirin ec, b-d ins syr ultrafine 1cc/30g, b-d ins syr ultrafine 1cc/31g, cholecalciferol, diclofenac sodium, famotidine, fluoxetine, furosemide, insulin nph human, losartan, lovastatin, magnesium oxide, metoprolol, nitroglycerin, oxycodone, oxycodone, oxycodone, potassium chloride sa, pregabalin, tizanidine, trazodone, and vitamin d (ergocalciferol).  Current Outpatient Prescriptions on File Prior to Visit  Medication Sig  . albuterol (PROVENTIL HFA;VENTOLIN HFA) 108 (90 Base) MCG/ACT inhaler Inhale into the lungs as needed.   Marland Kitchen amLODipine (NORVASC) 10 MG tablet Take 10 mg by mouth daily.   Marland Kitchen aspirin EC 81 MG tablet Take 81 mg by mouth daily.   . B-D INS SYR ULTRAFINE 1CC/30G 30G X 1/2" 1 ML MISC   . B-D INS SYR ULTRAFINE 1CC/31G 31G X 5/16" 1 ML MISC   .  diclofenac sodium (VOLTAREN) 1 % GEL Apply topically as needed.   . famotidine (PEPCID) 20 MG tablet Take 20 mg by mouth 2 (two) times daily.   Marland Kitchen FLUoxetine (PROZAC) 40 MG capsule Take 40 mg by mouth daily.   . furosemide (LASIX) 20 MG tablet Take 20 mg by mouth daily. 1-2 tabs daily PRN  . insulin NPH Human (HUMULIN N,NOVOLIN N) 100 UNIT/ML injection NPH inject SQ bid 50 units  . losartan (COZAAR) 100 MG tablet TAKE 1 TABLET (100 MG TOTAL) BY MOUTH ONCE DAILY.  Marland Kitchen lovastatin (MEVACOR) 10 MG tablet Take 10 mg by mouth at bedtime.  . magnesium oxide (MAG-OX) 400 MG tablet Take 1 tablet (400 mg total) by mouth daily.  . metoprolol (LOPRESSOR) 50 MG tablet Take 50 mg by mouth daily.  . nitroGLYCERIN (NITROSTAT) 0.3 MG SL tablet 1 tablet sublingual prn for ESOPHAGEAL SPASMS(may repeat every 5 min, seek med help if pain persists after 3 tablets)  . oxyCODONE (OXY IR/ROXICODONE) 5 MG immediate release tablet Take 1 tablet (5 mg total) by mouth every 8 (eight) hours as needed for moderate pain or severe pain.  Marland Kitchen oxyCODONE (OXY IR/ROXICODONE) 5 MG immediate release  tablet Take 1 tablet (5 mg total) by mouth every 8 (eight) hours as needed for moderate pain or severe pain.  Marland Kitchen oxyCODONE (OXY IR/ROXICODONE) 5 MG immediate release tablet Take 1 tablet (5 mg total) by mouth every 8 (eight) hours as needed for moderate pain or severe pain.  . potassium chloride SA (K-DUR,KLOR-CON) 20 MEQ tablet Take 20 mEq by mouth daily. prn  . pregabalin (LYRICA) 150 MG capsule Take 1 capsule (150 mg total) by mouth 2 (two) times daily.  Marland Kitchen tiZANidine (ZANAFLEX) 4 MG tablet Take 1 tablet (4 mg total) by mouth 2 (two) times daily as needed for muscle spasms.  . traZODone (DESYREL) 100 MG tablet Take 200 mg by mouth at bedtime.   . Vitamin D, Ergocalciferol, (DRISDOL) 50000 units CAPS capsule TAKE 1 CAPSULE (50,000 UNITS TOTAL) BY MOUTH ONCE A WEEK.   No current facility-administered medications on file prior to visit.     ROS   Constitutional: Denies any fever or chills Gastrointestinal: No reported hemesis, hematochezia, vomiting, or acute GI distress Musculoskeletal: Denies any acute onset joint swelling, redness, loss of ROM, or weakness Neurological: No reported episodes of acute onset apraxia, aphasia, dysarthria, agnosia, amnesia, paralysis, loss of coordination, or loss of consciousness  Allergies  Amber Acosta is allergic to mushroom extract complex; enbrel [etanercept]; mtx support [cobalamine combinations]; darvon [propoxyphene]; and minocycline.  PFSH  Medical:  Amber Acosta  has a past medical history of Arthritis; Cervical spondylosis without myelopathy (09/21/2015); Chronic pain; Chronic pain associated with significant psychosocial dysfunction (08/28/2013); Depression; Diabetes mellitus without complication (HCC); Displacement of cervical intervertebral disc without myelopathy (09/05/2015); Fibromyalgia; GERD (gastroesophageal reflux disease); Hypertension; Kidney disease, chronic, stage III (GFR 30-59 ml/min); Sarcoid (HCC); and Thrombopenia (HCC). Family: family history includes Alcohol abuse in her brother; Alzheimer's disease in her mother; COPD in her father; Diabetes in her father; Heart disease in her father. Surgical:  has a past surgical history that includes neck fusion; Breast surgery; Back surgery; Knee arthroscopy; Fracture surgery; Hand surgery; and Abdominal hysterectomy. Tobacco:  reports that she has quit smoking. She has quit using smokeless tobacco. Alcohol:  reports that she does not drink alcohol. Drug:  reports that she does not use drugs.  Constitutional Exam  Vitals: Blood pressure (!) 151/57, pulse (!) 59, temperature 98.6 F (37 C), temperature source Oral, resp. rate 16, height 5\' 3"  (1.6 m), weight 220 lb (99.8 kg), SpO2 100 %. General appearance: Well nourished, well developed, and well hydrated. In no acute distress Calculated BMI/Body habitus: Body mass index is 38.97 kg/m.  (35-39.9 kg/m2) Severe obesity (Class II) - 136% higher incidence of chronic pain Psych/Mental status: Alert and oriented x 3 (person, place, & time) Eyes: PERLA Respiratory: No evidence of acute respiratory distress  Cervical Spine Exam  Inspection: No masses, redness, or swelling Alignment: Symmetrical Functional ROM: ROM appears unrestricted. Significantly improved since nerve block. Stability: No instability detected Muscle strength & Tone: Functionally intact Sensory: Unimpaired Palpation: Non-contributory  Upper Extremity (UE) Exam    Side: Right upper extremity  Side: Left upper extremity  Inspection: No masses, redness, swelling, or asymmetry  Inspection: No masses, redness, swelling, or asymmetry  Functional ROM: ROM appears unrestricted  Functional ROM: ROM appears unrestricted  Muscle strength & Tone: Functionally intact  Muscle strength & Tone: Functionally intact  Sensory: Unimpaired  Sensory: Unimpaired  Palpation: Non-contributory  Palpation: Non-contributory   Thoracic Spine Exam  Inspection: No masses, redness, or swelling Alignment: Symmetrical Functional ROM: ROM appears unrestricted Stability:  No instability detected Sensory: Unimpaired Muscle strength & Tone: Functionally intact Palpation: Non-contributory  Lumbar Spine Exam  Inspection: No masses, redness, or swelling Alignment: Symmetrical Functional ROM: ROM appears unrestricted Stability: No instability detected Muscle strength & Tone: Functionally intact Sensory: Unimpaired Palpation: Non-contributory Provocative Tests: Lumbar Hyperextension and rotation test: evaluation deferred today       Patrick's Maneuver: evaluation deferred today              Gait & Posture Assessment  Ambulation: Unassisted Gait: Relatively normal for age and body habitus Posture: WNL   Lower Extremity Exam    Side: Right lower extremity  Side: Left lower extremity  Inspection: No masses, redness, swelling, or  asymmetry  Inspection: No masses, redness, swelling, or asymmetry  Functional ROM: ROM appears unrestricted  Functional ROM: ROM appears unrestricted  Muscle strength & Tone: Functionally intact  Muscle strength & Tone: Functionally intact  Sensory: Unimpaired  Sensory: Unimpaired  Palpation: Non-contributory  Palpation: Non-contributory    Assessment & Plan  Primary Diagnosis & Pertinent Problem List: The primary encounter diagnosis was Cervical facet syndrome (L). Diagnoses of Chronic pain, 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HCC), Cervical spondylosis, and Cervicogenic headache (L) were also pertinent to this visit.  Visit Diagnosis: 1. Cervical facet syndrome (L)   2. Chronic pain   3. 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HCC)   4. Cervical spondylosis   5. Cervicogenic headache (L)     Problems updated and reviewed during this visit: Problem  Inflammatory Polyarthropathy (Hcc)  Cervical facet syndrome (Left)   She describes having had radiofrequency ablation of her cervical facets done at [facility]. She indicates that the relief from that radiofrequency lasted several years. Diagnostic left sided cervical facet blocks done at [facility] have confirmed that she still has problems with her cervical facets and they are causing cervicogenic headaches on the left side. The diagnostic injections also confirmed 100% relief of the pain when blocking the cervical facets. This would indicate that she would be a good candidate for radiofrequency ablation.   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  Baseline Cr 1.3.   Copd (Chronic Obstructive Pulmonary Disease) (Hcc)  Depression  Type 2 Diabetes Mellitus (Hcc)   Overview:  Diagnosed 15 years. insulin past few years Overview:  Diagnosed 15 years. insulin past few years   Hypertensive  Heart Disease With Congestive Heart Failure (Hcc)   Overview:  Preserved left heart dysfunction likely secondary to hypertensive cardiomyopathy. Overview:  Preserved left heart dysfunction likely secondary to hypertensive cardiomyopathy.   Gerd (Gastroesophageal Reflux Disease)  Obesity, Class II, Bmi 35-39.9, With Comorbidity (Hcc)  Psoriasis  At Risk for Falls  History of Closed Head Injury  Sleep Terror  Gall Bladder Polyp   Overview:  Repeat ultrasound in 1year in 2016   Kidney Cysts  Hyperlipemia   Last Assessment & Plan:  Well controlled. Patient is not fasting today but will nedd repeat at future visit. Last Assessment & Plan:  Well controlled. Patient is not fasting today but will nedd repeat at future visit.  Last Assessment & Plan:  Well controlled. Patient is not fasting today but will nedd repeat at future visit.   Sarcoidosis (Hcc)    Problem-specific Plan(s): No problem-specific Assessment & Plan notes found for this encounter.  No new Assessment & Plan notes have been filed  under this hospital service since the last note was generated. Service: Pain Management   Plan of Care   Problem List Items Addressed This Visit      High   Cervical facet syndrome (Left) - Primary (Chronic)   Relevant Orders   CERVICAL FACET (MEDIAL BRANCH NERVE BLOCK)    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HCC) (Chronic)   Cervical spondylosis (Chronic)   Cervicogenic headache (L) (Chronic)   Chronic pain (Chronic)    Other Visit Diagnoses   None.      Pharmacotherapy (Medications Ordered): No orders of the defined types were placed in this encounter.   Lab-work & Procedure Ordered: Orders Placed This Encounter  Procedures  . CERVICAL FACET (MEDIAL BRANCH NERVE BLOCK)     Imaging Ordered: None  Interventional Therapies: Scheduled:  None at this time.    Considering:   Diagnostic left sided cervical facet block #2 under fluoroscopic guidance and IV  sedation. Diagnostic/palliative bilateral lumbar facet block under fluoroscopic guidance and IV sedation. Possible bilateral lumbar facet radiofrequency ablation under fluoroscopic guidance and IV sedation. Diagnostic/palliative left cervical facet block under fluoroscopic guidance and IV sedation.  Possible left-sided cervical facet radiofrequency ablation under fluoroscopic guidance and IV sedation.    PRN Procedures:   Diagnostic/palliative bilateral lumbar facet block under fluoroscopic guidance and IV sedation. Diagnostic/palliative left cervical facet block under fluoroscopic guidance and IV sedation.    Referral(s) or Consult(s): None at this time.  New Prescriptions   No medications on file    Medications administered during this visit: Amber Acosta had no medications administered during this visit.  Requested PM Follow-up: Return if symptoms worsen or fail to improve, for (PRN) Procedure.  Future Appointments Date Time Provider Department Center  09/15/2016 8:00 AM Delano Metz, MD [facility] None    Primary Care Physician: Rayetta Humphrey, MD Location: [facility] Note by: Sydnee Levans. Laban Emperor, M.D, DABA, DABAPM, DABPM, DABIPP, FIPP  Pain Score Disclaimer: We use the NRS-11 scale. This is a self-reported, subjective measurement of pain severity with only modest accuracy. It is used primarily to identify changes within a particular patient. It must be understood that outpatient pain scales are significantly less accurate that those used for research, where they can be applied under ideal controlled circumstances with minimal exposure to variables. In reality, the score is likely to be a combination of pain intensity and pain affect, where pain affect describes the degree of emotional arousal or changes in action readiness caused by the sensory experience of pain. Factors such as social and work situation, setting, emotional state, anxiety levels,  expectation, and prior pain experience may influence pain perception and show large inter-individual differences that may also be affected by time variables.  Patient instructions provided at this appointment:: There are no Patient Instructions on file for this visit.

## 2016-07-30 NOTE — Progress Notes (Signed)
Safety precautions to be maintained throughout the outpatient stay will include: orient to surroundings, keep bed in low position, maintain call bell within reach at all times, provide assistance with transfer out of bed and ambulation. Did not bring medication/ lost the bottle of medication.

## 2016-09-15 ENCOUNTER — Encounter: Payer: Self-pay | Admitting: Pain Medicine

## 2016-09-15 ENCOUNTER — Ambulatory Visit: Payer: Medicare Other | Attending: Pain Medicine | Admitting: Pain Medicine

## 2016-09-15 VITALS — BP 146/80 | HR 67 | Temp 98.5°F | Resp 20 | Ht 63.0 in | Wt 220.0 lb

## 2016-09-15 DIAGNOSIS — E785 Hyperlipidemia, unspecified: Secondary | ICD-10-CM | POA: Diagnosis not present

## 2016-09-15 DIAGNOSIS — G8929 Other chronic pain: Secondary | ICD-10-CM

## 2016-09-15 DIAGNOSIS — F1190 Opioid use, unspecified, uncomplicated: Secondary | ICD-10-CM | POA: Diagnosis not present

## 2016-09-15 DIAGNOSIS — H8110 Benign paroxysmal vertigo, unspecified ear: Secondary | ICD-10-CM | POA: Insufficient documentation

## 2016-09-15 DIAGNOSIS — Z833 Family history of diabetes mellitus: Secondary | ICD-10-CM | POA: Diagnosis not present

## 2016-09-15 DIAGNOSIS — I110 Hypertensive heart disease with heart failure: Secondary | ICD-10-CM | POA: Diagnosis not present

## 2016-09-15 DIAGNOSIS — Z6838 Body mass index (BMI) 38.0-38.9, adult: Secondary | ICD-10-CM | POA: Insufficient documentation

## 2016-09-15 DIAGNOSIS — K219 Gastro-esophageal reflux disease without esophagitis: Secondary | ICD-10-CM | POA: Insufficient documentation

## 2016-09-15 DIAGNOSIS — Z811 Family history of alcohol abuse and dependence: Secondary | ICD-10-CM | POA: Insufficient documentation

## 2016-09-15 DIAGNOSIS — Z794 Long term (current) use of insulin: Secondary | ICD-10-CM | POA: Insufficient documentation

## 2016-09-15 DIAGNOSIS — N3946 Mixed incontinence: Secondary | ICD-10-CM | POA: Insufficient documentation

## 2016-09-15 DIAGNOSIS — I5032 Chronic diastolic (congestive) heart failure: Secondary | ICD-10-CM | POA: Diagnosis not present

## 2016-09-15 DIAGNOSIS — Z8249 Family history of ischemic heart disease and other diseases of the circulatory system: Secondary | ICD-10-CM | POA: Diagnosis not present

## 2016-09-15 DIAGNOSIS — E669 Obesity, unspecified: Secondary | ICD-10-CM | POA: Diagnosis not present

## 2016-09-15 DIAGNOSIS — G894 Chronic pain syndrome: Secondary | ICD-10-CM | POA: Diagnosis present

## 2016-09-15 DIAGNOSIS — M797 Fibromyalgia: Secondary | ICD-10-CM | POA: Insufficient documentation

## 2016-09-15 DIAGNOSIS — Z981 Arthrodesis status: Secondary | ICD-10-CM | POA: Insufficient documentation

## 2016-09-15 DIAGNOSIS — M545 Low back pain: Secondary | ICD-10-CM | POA: Insufficient documentation

## 2016-09-15 DIAGNOSIS — Z90710 Acquired absence of both cervix and uterus: Secondary | ICD-10-CM | POA: Insufficient documentation

## 2016-09-15 DIAGNOSIS — Z7982 Long term (current) use of aspirin: Secondary | ICD-10-CM | POA: Insufficient documentation

## 2016-09-15 DIAGNOSIS — F329 Major depressive disorder, single episode, unspecified: Secondary | ICD-10-CM | POA: Insufficient documentation

## 2016-09-15 DIAGNOSIS — Z79891 Long term (current) use of opiate analgesic: Secondary | ICD-10-CM | POA: Insufficient documentation

## 2016-09-15 DIAGNOSIS — K824 Cholesterolosis of gallbladder: Secondary | ICD-10-CM | POA: Diagnosis not present

## 2016-09-15 DIAGNOSIS — Z87891 Personal history of nicotine dependence: Secondary | ICD-10-CM | POA: Insufficient documentation

## 2016-09-15 DIAGNOSIS — M791 Myalgia: Secondary | ICD-10-CM

## 2016-09-15 DIAGNOSIS — E1142 Type 2 diabetes mellitus with diabetic polyneuropathy: Secondary | ICD-10-CM | POA: Insufficient documentation

## 2016-09-15 DIAGNOSIS — M7918 Myalgia, other site: Secondary | ICD-10-CM

## 2016-09-15 DIAGNOSIS — R790 Abnormal level of blood mineral: Secondary | ICD-10-CM

## 2016-09-15 MED ORDER — MAGNESIUM OXIDE 400 MG PO TABS: 400.0000 mg | ORAL_TABLET | Freq: Every day | ORAL | 2 refills | Status: DC

## 2016-09-15 MED ORDER — OXYCODONE HCL 5 MG PO TABS: 5.0000 mg | ORAL_TABLET | Freq: Three times a day (TID) | ORAL | 0 refills | Status: DC | PRN

## 2016-09-15 MED ORDER — PREGABALIN 150 MG PO CAPS: 150.0000 mg | ORAL_CAPSULE | Freq: Two times a day (BID) | ORAL | 2 refills | Status: DC

## 2016-09-15 MED ORDER — TIZANIDINE HCL 4 MG PO TABS: 4.0000 mg | ORAL_TABLET | Freq: Two times a day (BID) | ORAL | 2 refills | Status: DC | PRN

## 2016-09-15 NOTE — Progress Notes (Signed)
Safety precautions to be maintained throughout the outpatient stay will include: orient to surroundings, keep bed in low position, maintain call bell within reach at all times, provide assistance with transfer out of bed and ambulation.  Bottle labeled oxycodone 5 mg #17/90  Filled 08-22-16

## 2016-09-15 NOTE — Patient Instructions (Signed)

## 2016-09-15 NOTE — Progress Notes (Signed)
Patient's Name: Amber Acosta  MRN: [id number]  Referring Provider: Rayetta Humphrey, MD  DOB: July 27, 1951  PCP: Rayetta Humphrey, MD  DOS: 09/15/2016  Note by: Sydnee Levans. Laban Emperor, MD  Service setting: Ambulatory outpatient  Specialty: Interventional Pain Management  Location: [facility] (AMB) Pain Management Facility    Patient type: Established   Primary Reason(s) for Visit: Encounter for prescription drug management (Level of risk: moderate) CC: Back Pain (lower)  HPI  Amber Acosta is a 65 y.o. year old, female patient, who comes today for an initial evaluation. She has Cervical vertebral fusion; Fibromyalgia; Lumbar facet joint pain; Lumbar facet syndrome (Bilateral) (R>L); Chronic kidney disease; COPD (chronic obstructive pulmonary disease) (HCC); Depression; Type 2 diabetes mellitus (HCC); Hypertensive heart disease with congestive heart failure (HCC); Gall bladder polyp; GERD (gastroesophageal reflux disease); Hyperlipemia; Inflammatory polyarthropathy (HCC); Sleep terror; Obesity, Class II, BMI 35-39.9, with comorbidity; OP (osteoporosis); Psoriasis; Kidney cysts; Muscle weakness (generalized); Sarcoidosis (HCC); Thrombocytopenia (HCC); Vitamin D deficiency; Chronic neck pain (Location of Secondary source of pain) (Bilateral) (L>R); Cervicogenic headache (L); Cervical facet syndrome (Left); Chronic shoulder pain (Location of Tertiary source of pain) (Bilateral) (L>R); At risk for falls; Balance problems; History of closed head injury; Opiate use (22.5 MME/Day); Long term current use of opiate analgesic; Encounter for therapeutic drug level monitoring; Long term prescription opiate use; Spondylolisthesis of cervical region; 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HCC); Hx of cervical spine surgery; Low magnesium levels; Myofascial pain; Chronic pain; Neuropathy (HCC); Benign paroxysmal positional vertigo; Diabetic peripheral neuropathy (HCC); Meralgia paresthetica (Right); Cervical spondylosis; Chronic diastolic congestive  heart failure (HCC); Mixed stress and urge urinary incontinence; Overactive detrusor; 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 Her primarily concern today is the Back Pain (lower)  Pain Assessment: Self-Reported Pain Score: 2 /10             Reported level is compatible with observation.       Pain Type: Chronic pain Pain Location: Back Pain Orientation: Lower, Mid Pain Descriptors / Indicators: Sharp, Throbbing Pain Frequency: Constant  The patient comes into the clinics today for pharmacological management of her chronic pain. I last saw this patient on 07/30/2016. The patient  reports that she does not use drugs. Her body mass index is 38.97 kg/m.  Date of Last Visit: 07/30/16 Service Provided on Last Visit: Evaluation  Controlled Substance Pharmacotherapy Assessment & REMS (Risk Evaluation and Mitigation Strategy)  Analgesic:Oxycodone IR 5 mg every 8 hours (15 mg/day) MME/day:22.5 mg/day  Pill Count: Bottle labeled oxycodone 5 mg #17/90  Filled 08-22-16. Pharmacokinetics: Onset of action (Liberation/Absorption): Within expected pharmacological parameters Time to Peak effect (Distribution): Timing and results are as within normal expected parameters Duration of action (Metabolism/Excretion): Within normal limits for medication Pharmacodynamics: Analgesic Effect: More than 50% Activity Facilitation: Medication(s) allow patient to sit, stand, walk, and do the basic ADLs Perceived Effectiveness: Described as relatively effective, allowing for increase in activities of daily living (ADL) Side-effects or Adverse reactions: None reported Monitoring: [place] PMP: Online review of the past 7-month period conducted. Compliant with practice rules and regulations List of all UDS test(s) done:  Lab Results  Component Value Date   TOXASSSELUR FINAL 03/26/2016   TOXASSSELUR FINAL 12/19/2015    Last UDS on record: ToxAssure Select 13  Date Value Ref Range Status  03/26/2016 FINAL  Final    Comment:    ==================================================================== TOXASSURE SELECT 13 (MW) ==================================================================== Test  Result       Flag       Units Drug Present and Declared for Prescription Verification   Oxycodone                      3692         EXPECTED   ng/mg creat   Noroxycodone                   3517         EXPECTED   ng/mg creat    Sources of oxycodone include scheduled prescription medications.    Noroxycodone is an expected metabolite of oxycodone. ==================================================================== Test                      Result    Flag   Units      Ref Range   Creatinine              59               mg/dL      >=16 ==================================================================== Declared Medications:  The flagging and interpretation on this report are based on the  following declared medications.  Unexpected results may arise from  inaccuracies in the declared medications.  **Note: The testing scope of this panel includes these medications:  Oxycodone (Oxy-IR)  **Note: The testing scope of this panel does not include following  reported medications:  Amlodipine (Norvasc)  Diclofenac (Voltaren)  Fluoxetine (Prozac)  Furosemide (Lasix)  Insulin (Humulin)  Iron (Ferrous Sulfate)  Lovastatin (Mevacor)  Magnesium (Mag-Ox)  Metformin (Glumetza)  Metoprolol (Lopressor)  Potassium (K-Dur)  Pregabalin (Lyrica)  Tizanidine (Zanaflex)  Trazodone (Desyrel) ==================================================================== For clinical consultation, please call [phone]. ====================================================================    UDS interpretation: Compliant          Medication Assessment Form: Reviewed. Patient indicates being compliant  with therapy Treatment compliance: Compliant Risk Assessment: Aberrant Behavior: None observed today Substance Use Disorder (SUD) Risk Level: Low-to-moderate Risk of opioid abuse or dependence: 0.7-3.0% with doses ? 36 MME/day and 6.1-26% with doses ? 120 MME/day. Opioid Risk Tool (ORT) Score:   6  Moderate Risk for SUD (Score between 4-7) Depression Scale Score: PHQ-2: 0   No depression (0) PHQ-9: 0   No depression (0-4)  Pharmacologic Plan: No change in therapy, at this time  Laboratory Chemistry  Inflammation Markers Lab Results  Component Value Date   ESRSEDRATE 52 (H) 03/26/2016   CRP 2.1 (H) 03/26/2016   Renal Function Lab Results  Component Value Date   BUN 14 03/26/2016   CREATININE 1.28 (H) 03/26/2016   GFRAA 50 (L) 03/26/2016   GFRNONAA 44 (L) 03/26/2016   Hepatic Function Lab Results  Component Value Date   AST 24 03/26/2016   ALT 20 03/26/2016   ALBUMIN 4.0 03/26/2016   Electrolytes Lab Results  Component Value Date   NA 136 03/26/2016   K 3.8 03/26/2016   CL 100 (L) 03/26/2016   CALCIUM 8.5 (L) 03/26/2016   MG 1.8 03/26/2016   Pain Modulating Vitamins Lab Results  Component Value Date   VD25OH 20.2 (L) 03/26/2016   VITAMINB12 467 03/26/2016   Coagulation Parameters Lab Results  Component Value Date   PLT 146 (L) 03/26/2016   Cardiovascular No results found for: BNP, HGB, HCT Note: Lab results reviewed.  Recent Diagnostic Imaging  No results found. Meds  The patient has a current medication list which includes the following prescription(s):  amlodipine, aspirin ec, b-d ins syr ultrafine 1cc/30g, b-d ins syr ultrafine 1cc/31g, cholecalciferol, diclofenac sodium, fluoxetine, furosemide, insulin nph human, losartan, lovastatin, magnesium oxide, metoprolol, nitroglycerin, oxycodone, oxycodone, oxycodone, potassium chloride sa, pregabalin, tizanidine, and trazodone.  Current Outpatient Prescriptions on File Prior to Visit  Medication Sig  .  aspirin EC 81 MG tablet Take 81 mg by mouth daily.   . B-D INS SYR ULTRAFINE 1CC/30G 30G X 1/2" 1 ML MISC   . B-D INS SYR ULTRAFINE 1CC/31G 31G X 5/16" 1 ML MISC   . cholecalciferol (VITAMIN D) 1000 units tablet Take by mouth daily.  . diclofenac sodium (VOLTAREN) 1 % GEL Apply topically as needed.   Marland Kitchen FLUoxetine (PROZAC) 40 MG capsule Take 40 mg by mouth daily.   . furosemide (LASIX) 20 MG tablet Take 20 mg by mouth daily. 1-2 tabs daily PRN  . insulin NPH Human (HUMULIN N,NOVOLIN N) 100 UNIT/ML injection NPH inject SQ bid 50 units  . losartan (COZAAR) 100 MG tablet TAKE 1 TABLET (100 MG TOTAL) BY MOUTH ONCE DAILY.  Marland Kitchen lovastatin (MEVACOR) 10 MG tablet Take 10 mg by mouth at bedtime.  . metoprolol (LOPRESSOR) 50 MG tablet Take 50 mg by mouth daily.  . nitroGLYCERIN (NITROSTAT) 0.3 MG SL tablet 1 tablet sublingual prn for ESOPHAGEAL SPASMS(may repeat every 5 min, seek med help if pain persists after 3 tablets)  . potassium chloride SA (K-DUR,KLOR-CON) 20 MEQ tablet Take 20 mEq by mouth daily. prn  . traZODone (DESYREL) 100 MG tablet Take 200 mg by mouth at bedtime.    No current facility-administered medications on file prior to visit.    ROS  Constitutional: Denies any fever or chills Gastrointestinal: No reported hemesis, hematochezia, vomiting, or acute GI distress Musculoskeletal: Denies any acute onset joint swelling, redness, loss of ROM, or weakness Neurological: No reported episodes of acute onset apraxia, aphasia, dysarthria, agnosia, amnesia, paralysis, loss of coordination, or loss of consciousness  Allergies  Ms. Wager is allergic to enbrel [etanercept]; metformin; methotrexate; mushroom extract complex; other; darvon [propoxyphene]; minocycline; mtx support [cobalamine combinations]; and ciprofloxacin.  PFSH  Medical:  Ms. Perret  has a past medical history of Arthritis; Cervical spondylosis without myelopathy (09/21/2015); Chronic pain; Chronic pain associated with significant  psychosocial dysfunction (08/28/2013); Depression; Diabetes mellitus without complication (HCC); Displacement of cervical intervertebral disc without myelopathy (09/05/2015); Fibromyalgia; GERD (gastroesophageal reflux disease); Hypertension; Kidney disease, chronic, stage III (GFR 30-59 ml/min); Sarcoid (HCC); and Thrombopenia (HCC). Family: family history includes Alcohol abuse in her brother; Alzheimer's disease in her mother; COPD in her father; Diabetes in her father; Heart disease in her father. Surgical:  has a past surgical history that includes neck fusion; Breast surgery; Back surgery; Knee arthroscopy; Fracture surgery; Hand surgery; and Abdominal hysterectomy. Tobacco:  reports that she has quit smoking. She has quit using smokeless tobacco. Alcohol:  reports that she does not drink alcohol. Drug:  reports that she does not use drugs.  Constitutional Exam  General appearance: Well nourished, well developed, and well hydrated. In no acute distress Vitals:   09/15/16 0801  BP: (!) 146/80  Pulse: 67  Resp: 20  Temp: 98.5 F (36.9 C)  SpO2: 100%  Weight: 220 lb (99.8 kg)  Height: 5\' 3"  (1.6 m)  BMI Assessment: Estimated body mass index is 38.97 kg/m as calculated from the following:   Height as of this encounter: 5\' 3"  (1.6 m).   Weight as of this encounter: 220 lb (99.8 kg).   BMI interpretation: (35-39.9  kg/m2) = Severe obesity (Class II): This range is associated with a 136% higher incidence of chronic pain. BMI Readings from Last 4 Encounters:  09/15/16 38.97 kg/m  07/30/16 38.97 kg/m  07/10/16 38.97 kg/m  06/23/16 38.97 kg/m   Wt Readings from Last 4 Encounters:  09/15/16 220 lb (99.8 kg)  07/30/16 220 lb (99.8 kg)  07/10/16 220 lb (99.8 kg)  06/23/16 220 lb (99.8 kg)  Psych/Mental status: Alert and oriented x 3 (person, place, & time) Eyes: PERLA Respiratory: No evidence of acute respiratory distress  Cervical Spine Exam  Inspection: No masses, redness, or  swelling Alignment: Symmetrical Functional ROM: Unrestricted ROM Stability: No instability detected Muscle strength & Tone: Functionally intact Sensory: Unimpaired Palpation: Non-contributory  Upper Extremity (UE) Exam    Side: Right upper extremity  Side: Left upper extremity  Inspection: No masses, redness, swelling, or asymmetry  Inspection: No masses, redness, swelling, or asymmetry  Functional ROM: Unrestricted ROM         Functional ROM: Unrestricted ROM          Muscle strength & Tone: Functionally intact  Muscle strength & Tone: Functionally intact  Sensory: Unimpaired  Sensory: Unimpaired  Palpation: Non-contributory  Palpation: Non-contributory   Thoracic Spine Exam  Inspection: No masses, redness, or swelling Alignment: Symmetrical Functional ROM: Unrestricted ROM Stability: No instability detected Sensory: Unimpaired Muscle strength & Tone: Functionally intact Palpation: Non-contributory  Lumbar Spine Exam  Inspection: No masses, redness, or swelling Alignment: Symmetrical Functional ROM: Decreased ROM Stability: No instability detected Muscle strength & Tone: Functionally intact Sensory: Movement-associated discomfort Palpation: Complains of area being tender to palpation Provocative Tests: Lumbar Hyperextension and rotation test: Positive bilaterally for facet joint pain. Patrick's Maneuver: evaluation deferred today              Gait & Posture Assessment  Ambulation: Unassisted Gait: Relatively normal for age and body habitus Posture: WNL   Lower Extremity Exam    Side: Right lower extremity  Side: Left lower extremity  Inspection: No masses, redness, swelling, or asymmetry  Inspection: No masses, redness, swelling, or asymmetry  Functional ROM: Unrestricted ROM          Functional ROM: Unrestricted ROM          Muscle strength & Tone: Functionally intact  Muscle strength & Tone: Functionally intact  Sensory: Unimpaired  Sensory: Unimpaired  Palpation:  Non-contributory  Palpation: Non-contributory   Assessment  Primary Diagnosis & Pertinent Problem List: The primary encounter diagnosis was Chronic pain. Diagnoses of Long term current use of opiate analgesic, Opiate use (22.5 MME/Day), Chronic low back pain (Location of Primary Source of Pain) (Bilateral) (R>L), Chronic pain syndrome, Fibromyalgia, Low magnesium levels, and Myofascial pain were also pertinent to this visit.  Visit Diagnosis: 1. Chronic pain   2. Long term current use of opiate analgesic   3. Opiate use (22.5 MME/Day)   4. Chronic low back pain (Location of Primary Source of Pain) (Bilateral) (R>L)   5. Chronic pain syndrome   6. Fibromyalgia   7. Low magnesium levels   8. Myofascial pain    Plan of Care  Pharmacotherapy (Medications Ordered): Meds ordered this encounter  Medications  . oxyCODONE (OXY IR/ROXICODONE) 5 MG immediate release tablet    Sig: Take 1 tablet (5 mg total) by mouth every 8 (eight) hours as needed for moderate pain or severe pain.    Dispense:  90 tablet    Refill:  0    Do not place this  medication, or any other prescription from our practice, on "Automatic Refill". Patient may have prescription filled one day early if pharmacy is closed on scheduled refill date. Do not fill until: 09/27/16 To last until: 10/27/16  . oxyCODONE (OXY IR/ROXICODONE) 5 MG immediate release tablet    Sig: Take 1 tablet (5 mg total) by mouth every 8 (eight) hours as needed for moderate pain or severe pain.    Dispense:  90 tablet    Refill:  0    Do not place this medication, or any other prescription from our practice, on "Automatic Refill". Patient may have prescription filled one day early if pharmacy is closed on scheduled refill date. Do not fill until: 10/27/16 To last until: 11/26/16  . oxyCODONE (OXY IR/ROXICODONE) 5 MG immediate release tablet    Sig: Take 1 tablet (5 mg total) by mouth every 8 (eight) hours as needed for moderate pain or severe pain.     Dispense:  90 tablet    Refill:  0    Do not place this medication, or any other prescription from our practice, on "Automatic Refill". Patient may have prescription filled one day early if pharmacy is closed on scheduled refill date. Do not fill until: 11/26/16 To last until: 12/26/16  . pregabalin (LYRICA) 150 MG capsule    Sig: Take 1 capsule (150 mg total) by mouth 2 (two) times daily.    Dispense:  60 capsule    Refill:  2    Do not place this medication, or any other prescription from our practice, on "Automatic Refill". Patient may have prescription filled one day early if pharmacy is closed on scheduled refill date.  . magnesium oxide (MAG-OX) 400 MG tablet    Sig: Take 1 tablet (400 mg total) by mouth daily.    Dispense:  30 tablet    Refill:  2    Do not place this medication, or any other prescription from our practice, on "Automatic Refill". Patient may have prescription filled one day early if pharmacy is closed on scheduled refill date.  Marland Kitchen tiZANidine (ZANAFLEX) 4 MG tablet    Sig: Take 1 tablet (4 mg total) by mouth 2 (two) times daily as needed for muscle spasms.    Dispense:  60 tablet    Refill:  2    Do not place this medication, or any other prescription from our practice, on "Automatic Refill". Patient may have prescription filled one day early if pharmacy is closed on scheduled refill date.   New Prescriptions   No medications on file   Medications administered during this visit: Ms. Huntley had no medications administered during this visit. Lab-work, Procedure(s), & Referral(s) Ordered: No orders of the defined types were placed in this encounter.  Imaging & Referral(s) Ordered: None  Interventional Therapies: Scheduled:  None at this time.    Considering:   Diagnostic left sided cervical facet block #2 under fluoroscopic guidance and IV sedation. Diagnostic/palliative bilateral lumbar facet block under fluoroscopic guidance and IV sedation. Possible  bilateral lumbar facet radiofrequency ablation under fluoroscopic guidance and IV sedation. Diagnostic/palliative left cervical facet block under fluoroscopic guidance and IV sedation.  Possible left-sided cervical facet radiofrequency ablation under fluoroscopic guidance and IV sedation.    PRN Procedures:   Diagnostic/palliative bilateral lumbar facet block under fluoroscopic guidance and IV sedation. Diagnostic/palliative left cervical facet block under fluoroscopic guidance and IV sedation.    Requested PM Follow-up: Return in 3 months (on 12/16/2016) for Med-Mgmt.  Future Appointments  Date Time Provider Department Center  12/16/2016 8:15 AM Delano MetzFrancisco [place] Schweppe, MD [facility] None   Primary Care Physician: Rayetta HumphreyGeorge, Sionne A, MD [facility] Note by: Sydnee LevansFrancisco A. Laban EmperorNaveira, M.D, DABA, DABAPM, DABPM, DABIPP, FIPP  Pain Score Disclaimer: We use the NRS-11 scale. This is a self-reported, subjective measurement of pain severity with only modest accuracy. It is used primarily to identify changes within a particular patient. It must be understood that outpatient pain scales are significantly less accurate that those used for research, where they can be applied under ideal controlled circumstances with minimal exposure to variables. In reality, the score is likely to be a combination of pain intensity and pain affect, where pain affect describes the degree of emotional arousal or changes in action readiness caused by the sensory experience of pain. Factors such as social and work situation, setting, emotional state, anxiety levels, expectation, and prior pain experience may influence pain perception and show large inter-individual differences that may also be affected by time variables.  Patient instructions provided during this appointment: Patient Instructions  Pain Management Discharge Instructions  General Discharge Instructions :  If you need to reach your  doctor call: Monday-Friday 8:00 am - 4:00 pm at [phone] or toll free [phone].  After clinic hours [phone] to have operator reach doctor.  Bring all of your medication bottles to all your appointments in the pain clinic.  To cancel or reschedule your appointment with Pain Management please remember to call 24 hours in advance to avoid a fee.  Refer to the educational materials which you have been given on: General Risks, I had my Procedure. Discharge Instructions, Post Sedation.  Post Procedure Instructions:  The drugs you were given will stay in your system until tomorrow, so for the next 24 hours you should not drive, make any legal decisions or drink any alcoholic beverages.  You may eat anything you prefer, but it is better to start with liquids then soups and crackers, and gradually work up to solid foods.  Please notify your doctor immediately if you have any unusual bleeding, trouble breathing or pain that is not related to your normal pain.  Depending on the type of procedure that was done, some parts of your body may feel week and/or numb.  This usually clears up by tonight or the next day.  Walk with the use of an assistive device or accompanied by an adult for the 24 hours.  You may use ice on the affected area for the first 24 hours.  Put ice in a Ziploc bag and cover with a towel and place against area 15 minutes on 15 minutes off.  You may switch to heat after 24 hours.

## 2016-11-10 ENCOUNTER — Ambulatory Visit
Admission: RE | Admit: 2016-11-10 | Discharge: 2016-11-10 | Disposition: A | Discharge status: Home / Self Care | Payer: Medicare Other | Source: Ambulatory Visit | Attending: Pain Medicine | Admitting: Pain Medicine

## 2016-11-10 ENCOUNTER — Ambulatory Visit (HOSPITAL_BASED_OUTPATIENT_CLINIC_OR_DEPARTMENT_OTHER): Payer: Medicare Other | Admitting: Pain Medicine

## 2016-11-10 ENCOUNTER — Encounter: Payer: Self-pay | Admitting: Pain Medicine

## 2016-11-10 VITALS — BP 106/52 | Temp 96.5°F | Resp 14 | Ht 63.0 in | Wt 225.0 lb

## 2016-11-10 DIAGNOSIS — M47812 Spondylosis without myelopathy or radiculopathy, cervical region: Secondary | ICD-10-CM

## 2016-11-10 DIAGNOSIS — Z5189 Encounter for other specified aftercare: Secondary | ICD-10-CM | POA: Insufficient documentation

## 2016-11-10 DIAGNOSIS — M47892 Other spondylosis, cervical region: Secondary | ICD-10-CM

## 2016-11-10 DIAGNOSIS — M1288 Other specific arthropathies, not elsewhere classified, other specified site: Secondary | ICD-10-CM | POA: Insufficient documentation

## 2016-11-10 DIAGNOSIS — M4322 Fusion of spine, cervical region: Secondary | ICD-10-CM | POA: Diagnosis not present

## 2016-11-10 DIAGNOSIS — M452 Ankylosing spondylitis of cervical region: Secondary | ICD-10-CM

## 2016-11-10 DIAGNOSIS — G8929 Other chronic pain: Secondary | ICD-10-CM

## 2016-11-10 DIAGNOSIS — M542 Cervicalgia: Secondary | ICD-10-CM | POA: Insufficient documentation

## 2016-11-10 DIAGNOSIS — Z9889 Other specified postprocedural states: Secondary | ICD-10-CM

## 2016-11-10 MED ORDER — SODIUM CHLORIDE 0.9% FLUSH
2.0000 mL | Freq: Once | INTRAVENOUS | Status: AC
  Administered 2016-11-10: 2 mL

## 2016-11-10 MED ORDER — KETOROLAC TROMETHAMINE 60 MG/2ML IM SOLN: 60.0000 mg | Freq: Once | INTRAMUSCULAR | Status: DC

## 2016-11-10 MED ORDER — ORPHENADRINE CITRATE 30 MG/ML IJ SOLN: 30.0000 mg | Freq: Once | INTRAMUSCULAR | Status: DC

## 2016-11-10 MED ORDER — IOPAMIDOL (ISOVUE-M 200) INJECTION 41%: 10.0000 mL | Freq: Once | INTRAMUSCULAR | Status: DC

## 2016-11-10 MED ORDER — KETOROLAC TROMETHAMINE 30 MG/ML IJ SOLN: 30.0000 mg | Freq: Once | INTRAMUSCULAR | Status: DC

## 2016-11-10 MED ORDER — ORPHENADRINE CITRATE 30 MG/ML IJ SOLN: 60.0000 mg | Freq: Once | INTRAMUSCULAR | Status: DC

## 2016-11-10 MED ORDER — SODIUM CHLORIDE 0.9 % IJ SOLN
INTRAMUSCULAR | Status: AC
  Administered 2016-11-10: 10:00:00
  Filled 2016-11-10: qty 10

## 2016-11-10 MED ORDER — LACTATED RINGERS IV SOLN
1000.0000 mL | Freq: Once | INTRAVENOUS | Status: AC
  Administered 2016-11-10: 1000 mL via INTRAVENOUS

## 2016-11-10 MED ORDER — DEXAMETHASONE SODIUM PHOSPHATE 4 MG/ML IJ SOLN
INTRAMUSCULAR | Status: AC
  Administered 2016-11-10: 10:00:00
  Filled 2016-11-10: qty 3

## 2016-11-10 MED ORDER — FENTANYL CITRATE (PF) 100 MCG/2ML IJ SOLN
25.0000 ug | INTRAMUSCULAR | Status: DC | PRN
  Filled 2016-11-10: qty 2

## 2016-11-10 MED ORDER — MIDAZOLAM HCL 5 MG/5ML IJ SOLN
1.0000 mg | INTRAMUSCULAR | Status: DC | PRN
  Filled 2016-11-10: qty 5

## 2016-11-10 MED ORDER — DEXAMETHASONE SODIUM PHOSPHATE 4 MG/ML IJ SOLN
10.0000 mg | Freq: Once | INTRAMUSCULAR | Status: AC
  Administered 2016-11-10: 10 mg

## 2016-11-10 MED ORDER — ROPIVACAINE HCL 2 MG/ML IJ SOLN
2.0000 mL | Freq: Once | INTRAMUSCULAR | Status: AC
  Administered 2016-11-10: 2 mL via EPIDURAL
  Filled 2016-11-10: qty 10

## 2016-11-10 MED ORDER — LIDOCAINE HCL (PF) 1 % IJ SOLN
10.0000 mL | Freq: Once | INTRAMUSCULAR | Status: AC
  Administered 2016-11-10: 10 mL
  Filled 2016-11-10: qty 10

## 2016-11-10 NOTE — Patient Instructions (Signed)
Pain Management Discharge Instructions  General Discharge Instructions :  If you need to reach your doctor call: Monday-Friday 8:00 am - 4:00 pm at [phone] or toll free [phone].  After clinic hours [phone] to have operator reach doctor.  Bring all of your medication bottles to all your appointments in the pain clinic.  To cancel or reschedule your appointment with Pain Management please remember to call 24 hours in advance to avoid a fee.  Refer to the educational materials which you have been given on: General Risks, I had my Procedure. Discharge Instructions, Post Sedation.  Post Procedure Instructions:  The drugs you were given will stay in your system until tomorrow, so for the next 24 hours you should not drive, make any legal decisions or drink any alcoholic beverages.  You may eat anything you prefer, but it is better to start with liquids then soups and crackers, and gradually work up to solid foods.  Please notify your doctor immediately if you have any unusual bleeding, trouble breathing or pain that is not related to your normal pain.  Depending on the type of procedure that was done, some parts of your body may feel week and/or numb.  This usually clears up by tonight or the next day.  Walk with the use of an assistive device or accompanied by an adult for the 24 hours.  You may use ice on the affected area for the first 24 hours.  Put ice in a Ziploc bag and cover with a towel and place against area 15 minutes on 15 minutes off.  You may switch to heat after 24 hours.Epidural Steroid Injection An epidural steroid injection is a shot of steroid medicine and numbing medicine that is given into the space between the spinal cord and the bones in your back (epidural space). The shot helps relieve pain caused by an irritated or swollen nerve root. The amount of pain relief you get from the injection depends on what is causing the nerve to be swollen and irritated,  and how long your pain lasts. You are more likely to benefit from this injection if your pain is strong and comes on suddenly rather than if you have had pain for a long time. Tell a health care provider about:  Any allergies you have.  All medicines you are taking, including vitamins, herbs, eye drops, creams, and over-the-counter medicines.  Any problems you or family members have had with anesthetic medicines.  Any blood disorders you have.  Any surgeries you have had.  Any medical conditions you have.  Whether you are pregnant or may be pregnant. What are the risks? Generally, this is a safe procedure. However, problems may occur, including:  Headache.  Bleeding.  Infection.  Allergic reaction to medicines.  Damage to your nerves. What happens before the procedure? Staying hydrated  Follow instructions from your health care provider about hydration, which may include:  Up to 2 hours before the procedure - you may continue to drink clear liquids, such as water, clear fruit juice, black coffee, and plain tea. Eating and drinking restrictions  Follow instructions from your health care provider about eating and drinking, which may include:  8 hours before the procedure - stop eating heavy meals or foods such as meat, fried foods, or fatty foods.  6 hours before the procedure - stop eating light meals or foods, such as toast or cereal.  6 hours before the procedure - stop drinking milk or drinks that contain milk.    2 hours before the procedure - stop drinking clear liquids. Medicine  You may be given medicines to lower anxiety.  Ask your health care provider about:  Changing or stopping your regular medicines. This is especially important if you are taking diabetes medicines or blood thinners.  Taking medicines such as aspirin and ibuprofen. These medicines can thin your blood. Do not take these medicines before your procedure if your health care provider instructs  you not to. General instructions  Plan to have someone take you home from the hospital or clinic. What happens during the procedure?  You may receive a medicine to help you relax (sedative).  You will be asked to lie on your abdomen.  The injection site will be cleaned.  A numbing medicine (local anesthetic) will be used to numb the injection site.  A needle will be inserted through your skin into the epidural space. You may feel some discomfort when this happens. An X-ray machine will be used to make sure the needle is put as close as possible to the affected nerve.  A steroid medicine and a local anesthetic will be injected into the epidural space.  The needle will be removed.  A bandage (dressing) will be put over the injection site. What happens after the procedure?  Your blood pressure, heart rate, breathing rate, and blood oxygen level will be monitored until the medicines you were given have worn off.  Your arm or leg may feel weak or numb for a few hours.  The injection site may feel sore.  Do not drive for 24 hours if you received a sedative. This information is not intended to replace advice given to you by your health care provider. Make sure you discuss any questions you have with your health care provider. Document Released: 02/24/2008 Document Revised: 04/30/2016 Document Reviewed: 03/04/2016 [employer] Interactive Patient Education  2017 [employer]  

## 2016-11-10 NOTE — Progress Notes (Signed)
Safety precautions to be maintained throughout the outpatient stay will include: orient to surroundings, keep bed in low position, maintain call bell within reach at all times, provide assistance with transfer out of bed and ambulation.  
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
## 2016-11-11 ENCOUNTER — Telehealth: Payer: Self-pay

## 2016-11-11 NOTE — Telephone Encounter (Signed)
Patient states she is doing GREAT today- no pain "i am wonderful"

## 2016-11-26 DIAGNOSIS — D860 Sarcoidosis of lung: Secondary | ICD-10-CM | POA: Insufficient documentation

## 2016-12-16 ENCOUNTER — Encounter: Payer: Self-pay | Admitting: Pain Medicine

## 2016-12-16 ENCOUNTER — Encounter (INDEPENDENT_AMBULATORY_CARE_PROVIDER_SITE_OTHER): Payer: Self-pay

## 2016-12-16 ENCOUNTER — Other Ambulatory Visit: Payer: Self-pay | Admitting: Pain Medicine

## 2016-12-16 ENCOUNTER — Ambulatory Visit: Payer: Medicare Other | Attending: Pain Medicine | Admitting: Pain Medicine

## 2016-12-16 VITALS — BP 132/43 | HR 70 | Temp 98.5°F | Resp 16 | Ht 63.0 in | Wt 230.0 lb

## 2016-12-16 DIAGNOSIS — D860 Sarcoidosis of lung: Secondary | ICD-10-CM | POA: Insufficient documentation

## 2016-12-16 DIAGNOSIS — M47812 Spondylosis without myelopathy or radiculopathy, cervical region: Secondary | ICD-10-CM | POA: Diagnosis not present

## 2016-12-16 DIAGNOSIS — Z794 Long term (current) use of insulin: Secondary | ICD-10-CM | POA: Insufficient documentation

## 2016-12-16 DIAGNOSIS — M797 Fibromyalgia: Secondary | ICD-10-CM | POA: Diagnosis not present

## 2016-12-16 DIAGNOSIS — G8929 Other chronic pain: Secondary | ICD-10-CM | POA: Diagnosis not present

## 2016-12-16 DIAGNOSIS — Z79891 Long term (current) use of opiate analgesic: Secondary | ICD-10-CM | POA: Diagnosis not present

## 2016-12-16 DIAGNOSIS — G894 Chronic pain syndrome: Secondary | ICD-10-CM | POA: Insufficient documentation

## 2016-12-16 DIAGNOSIS — J449 Chronic obstructive pulmonary disease, unspecified: Secondary | ICD-10-CM | POA: Diagnosis not present

## 2016-12-16 DIAGNOSIS — E669 Obesity, unspecified: Secondary | ICD-10-CM | POA: Insufficient documentation

## 2016-12-16 DIAGNOSIS — M7918 Myalgia, other site: Secondary | ICD-10-CM

## 2016-12-16 DIAGNOSIS — N183 Chronic kidney disease, stage 3 (moderate): Secondary | ICD-10-CM | POA: Diagnosis not present

## 2016-12-16 DIAGNOSIS — Z811 Family history of alcohol abuse and dependence: Secondary | ICD-10-CM | POA: Diagnosis not present

## 2016-12-16 DIAGNOSIS — M791 Myalgia: Secondary | ICD-10-CM

## 2016-12-16 DIAGNOSIS — M488X6 Other specified spondylopathies, lumbar region: Secondary | ICD-10-CM | POA: Diagnosis not present

## 2016-12-16 DIAGNOSIS — F329 Major depressive disorder, single episode, unspecified: Secondary | ICD-10-CM | POA: Diagnosis not present

## 2016-12-16 DIAGNOSIS — Z825 Family history of asthma and other chronic lower respiratory diseases: Secondary | ICD-10-CM | POA: Insufficient documentation

## 2016-12-16 DIAGNOSIS — Z8249 Family history of ischemic heart disease and other diseases of the circulatory system: Secondary | ICD-10-CM | POA: Diagnosis not present

## 2016-12-16 DIAGNOSIS — K219 Gastro-esophageal reflux disease without esophagitis: Secondary | ICD-10-CM | POA: Insufficient documentation

## 2016-12-16 DIAGNOSIS — F1190 Opioid use, unspecified, uncomplicated: Secondary | ICD-10-CM | POA: Diagnosis not present

## 2016-12-16 DIAGNOSIS — E785 Hyperlipidemia, unspecified: Secondary | ICD-10-CM | POA: Insufficient documentation

## 2016-12-16 DIAGNOSIS — Z79899 Other long term (current) drug therapy: Secondary | ICD-10-CM | POA: Diagnosis not present

## 2016-12-16 DIAGNOSIS — Z7982 Long term (current) use of aspirin: Secondary | ICD-10-CM | POA: Diagnosis not present

## 2016-12-16 DIAGNOSIS — I130 Hypertensive heart and chronic kidney disease with heart failure and stage 1 through stage 4 chronic kidney disease, or unspecified chronic kidney disease: Secondary | ICD-10-CM | POA: Diagnosis not present

## 2016-12-16 DIAGNOSIS — M542 Cervicalgia: Secondary | ICD-10-CM

## 2016-12-16 DIAGNOSIS — Z87891 Personal history of nicotine dependence: Secondary | ICD-10-CM | POA: Diagnosis not present

## 2016-12-16 DIAGNOSIS — L409 Psoriasis, unspecified: Secondary | ICD-10-CM | POA: Insufficient documentation

## 2016-12-16 DIAGNOSIS — E1122 Type 2 diabetes mellitus with diabetic chronic kidney disease: Secondary | ICD-10-CM | POA: Insufficient documentation

## 2016-12-16 DIAGNOSIS — M488X2 Other specified spondylopathies, cervical region: Secondary | ICD-10-CM | POA: Insufficient documentation

## 2016-12-16 DIAGNOSIS — Z833 Family history of diabetes mellitus: Secondary | ICD-10-CM | POA: Diagnosis not present

## 2016-12-16 DIAGNOSIS — E1142 Type 2 diabetes mellitus with diabetic polyneuropathy: Secondary | ICD-10-CM | POA: Insufficient documentation

## 2016-12-16 DIAGNOSIS — N3946 Mixed incontinence: Secondary | ICD-10-CM | POA: Insufficient documentation

## 2016-12-16 DIAGNOSIS — G5711 Meralgia paresthetica, right lower limb: Secondary | ICD-10-CM | POA: Insufficient documentation

## 2016-12-16 DIAGNOSIS — E559 Vitamin D deficiency, unspecified: Secondary | ICD-10-CM | POA: Insufficient documentation

## 2016-12-16 DIAGNOSIS — Z9181 History of falling: Secondary | ICD-10-CM | POA: Insufficient documentation

## 2016-12-16 DIAGNOSIS — M4312 Spondylolisthesis, cervical region: Secondary | ICD-10-CM | POA: Insufficient documentation

## 2016-12-16 DIAGNOSIS — M549 Dorsalgia, unspecified: Secondary | ICD-10-CM | POA: Diagnosis not present

## 2016-12-16 DIAGNOSIS — M810 Age-related osteoporosis without current pathological fracture: Secondary | ICD-10-CM | POA: Insufficient documentation

## 2016-12-16 DIAGNOSIS — Z888 Allergy status to other drugs, medicaments and biological substances status: Secondary | ICD-10-CM | POA: Insufficient documentation

## 2016-12-16 DIAGNOSIS — Z6841 Body Mass Index (BMI) 40.0 and over, adult: Secondary | ICD-10-CM | POA: Insufficient documentation

## 2016-12-16 DIAGNOSIS — I5032 Chronic diastolic (congestive) heart failure: Secondary | ICD-10-CM | POA: Insufficient documentation

## 2016-12-16 DIAGNOSIS — N3281 Overactive bladder: Secondary | ICD-10-CM | POA: Insufficient documentation

## 2016-12-16 DIAGNOSIS — M25512 Pain in left shoulder: Secondary | ICD-10-CM | POA: Insufficient documentation

## 2016-12-16 DIAGNOSIS — Z5181 Encounter for therapeutic drug level monitoring: Secondary | ICD-10-CM | POA: Insufficient documentation

## 2016-12-16 DIAGNOSIS — Z881 Allergy status to other antibiotic agents status: Secondary | ICD-10-CM | POA: Diagnosis not present

## 2016-12-16 DIAGNOSIS — M25511 Pain in right shoulder: Secondary | ICD-10-CM | POA: Insufficient documentation

## 2016-12-16 MED ORDER — OXYCODONE HCL 5 MG PO TABS: 5.0000 mg | ORAL_TABLET | Freq: Three times a day (TID) | ORAL | 0 refills | Status: DC | PRN

## 2016-12-16 MED ORDER — TIZANIDINE HCL 4 MG PO TABS: 4.0000 mg | ORAL_TABLET | Freq: Two times a day (BID) | ORAL | 2 refills | Status: DC | PRN

## 2016-12-16 MED ORDER — PREGABALIN 150 MG PO CAPS: 150.0000 mg | ORAL_CAPSULE | Freq: Two times a day (BID) | ORAL | 2 refills | Status: DC

## 2016-12-16 NOTE — Progress Notes (Signed)
Patient's Name: Amber Acosta  MRN: [id number]  Referring Provider: Sharyne Peach, MD  DOB: 12/11/1950  PCP: Sharyne Peach, MD  DOS: 12/16/2016  Note by: Kathlen Brunswick. Dossie Arbour, MD  Service setting: Ambulatory outpatient  Specialty: Interventional Pain Management  Location: [facility] (AMB) Pain Management Facility    Patient type: Established   Primary Reason(s) for Visit: Encounter for prescription drug management & post-procedure evaluation of chronic illness with mild to moderate exacerbation(Level of risk: moderate) CC: Neck Pain (upper)  HPI  Ms. Herd is a 66 y.o. year old, female patient, who comes today for a post-procedure evaluation and medication management. She has Cervical vertebral fusion (Uncomplicated C5 through C7 ACDF.); Fibromyalgia; Lumbar facet joint pain; Lumbar facet syndrome (Bilateral) (R>L); Chronic kidney disease; COPD (chronic obstructive pulmonary disease) ([place]); Depression; Type 2 diabetes mellitus ([place]); Hypertensive heart disease with congestive heart failure ([place]); Gall bladder polyp; GERD (gastroesophageal reflux disease); Hyperlipemia; Inflammatory polyarthropathy ([place]); Sleep terror; Obesity, Class II, BMI 35-39.9, with comorbidity; OP (osteoporosis); Psoriasis; Kidney cysts; Muscle weakness (generalized); Sarcoidosis ([place]); Thrombocytopenia ([place]); Vitamin D deficiency; Chronic neck pain (Location of Secondary source of pain) (Bilateral) (L>R); Cervicogenic headache (L); Cervical facet syndrome (Left); Chronic shoulder pain (Location of Tertiary source of pain) (Bilateral) (L>R); At risk for falls; Balance problems; History of closed head injury; Opiate use (22.5 MME/Day); Long term current use of opiate analgesic; Encounter for therapeutic drug level monitoring; Long term prescription opiate use; Spondylolisthesis of cervical region; 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 Her primarily concern today is the Neck Pain (upper)  Pain Assessment: Self-Reported Pain Score: 2 /10             Reported level is compatible with observation.       Pain Type: Chronic pain Pain Location: Neck Pain Orientation: Upper Pain Descriptors / Indicators: Burning, Sharp, Constant Pain Frequency: Constant  Ms. Frenz was last seen on 11/10/2016 for a procedure. During today's appointment we reviewed Ms. Kisner's post-procedure results, as well as her outpatient medication regimen. The patient was diagnosed with sarcoidosis.  Further details on both, my assessment(s), as well as the proposed treatment plan, please see below.  Controlled Substance Pharmacotherapy Assessment REMS (Risk Evaluation and Mitigation Strategy)  Analgesic:Oxycodone IR 5 mg every 8 hours (15 mg/day) MME/day:22.5 mg/day Janett Billow, RN  12/16/2016  9:03 AM  Sign at close encounter Nursing Pain Medication Assessment:  Safety precautions to be maintained throughout the outpatient stay will include: orient to surroundings, keep bed in low position, maintain call bell within reach at all times, provide assistance with transfer out of bed and ambulation.  Medication Inspection Compliance: Pill count conducted under aseptic conditions, in front of the patient. Neither the pills nor the bottle was removed from the patient's sight at any time. Once count was completed pills were immediately returned to the patient in their original bottle.  Medication: See above Pill Count: 56  of 90 pills remain Bottle Appearance: Standard pharmacy container. Clearly labeled. Filled Date: 01 / 04 / 2017 Medication last intake:12/15/16   Pharmacokinetics: Liberation and absorption (onset of  action): WNL Distribution (time to peak effect): WNL Metabolism and excretion (duration of action): WNL         Pharmacodynamics: Desired effects: Analgesia: Ms. Bells reports >50% benefit. Functional ability: Patient reports that medication allows her to accomplish basic ADLs Clinically meaningful improvement in function (CMIF): Sustained CMIF goals met Perceived effectiveness: Described as relatively effective, allowing for increase in activities of daily living (ADL) Undesirable effects: Side-effects or Adverse reactions: None reported Monitoring: [place] PMP: Online review of the past 60-monthperiod conducted. Compliant with practice rules and regulations List of all UDS test(s) done:  Lab Results  Component Value Date   TOXASSSELUR FINAL 03/26/2016   TNorthwayFINAL 12/19/2015   Last UDS on record: ToxAssure Select 13  Date Value Ref Range Status  03/26/2016 FINAL  Final    Comment:    ==================================================================== TOXASSURE SELECT 13 (MW) ==================================================================== Test                             Result       Flag       Units Drug Present and Declared for Prescription Verification   Oxycodone                      3692         EXPECTED   ng/mg creat   Noroxycodone                   3517         EXPECTED   ng/mg creat    Sources of oxycodone include scheduled prescription medications.    Noroxycodone is an expected metabolite of oxycodone. ==================================================================== Test                      Result    Flag   Units      Ref Range   Creatinine              59               mg/dL       >=20 ==================================================================== Declared Medications:  The flagging and interpretation on this report are based on the  following declared medications.  Unexpected results may arise from  inaccuracies in the declared medications.  **Note: The testing scope of this panel includes these medications:  Oxycodone (Oxy-IR)  **Note: The testing scope of this panel does not include following  reported medications:  Amlodipine (Norvasc)  Diclofenac (Voltaren)  Fluoxetine (Prozac)  Furosemide (Lasix)  Insulin (Humulin)  Iron (Ferrous Sulfate)  Lovastatin (Mevacor)  Magnesium (Mag-Ox)  Metformin (Glumetza)  Metoprolol (Lopressor)  Potassium (K-Dur)  Pregabalin (Lyrica)  Tizanidine (Zanaflex)  Trazodone (Desyrel) ==================================================================== For clinical consultation, please call ([phone] ====================================================================    UDS interpretation: Compliant          Medication Assessment Form: Reviewed. Patient indicates being compliant with therapy Treatment compliance: Compliant Risk Assessment Profile: Aberrant behavior: See prior evaluations. None observed or detected today Comorbid factors increasing risk of overdose: See prior notes. No additional risks detected today Risk of substance use disorder (SUD): Low Opioid Risk Tool (ORT) Total Score: 6  Interpretation Table:  Score <3 = Low Risk for SUD  Score between 4-7 = Moderate Risk for SUD  Score >8 = High Risk for Opioid Abuse   Risk Mitigation Strategies:  Patient Counseling: Covered Patient-Prescriber Agreement (PPA): Present and active  Notification to other  healthcare providers: Done  Pharmacologic Plan: No change in therapy, at this time  Post-Procedure Assessment  11/10/2016 Procedure: Diagnostic left sided cervical epidural steroid injection under fluoroscopic guidance and IV  sedation Post-procedure pain score: 0/10 (100% relief) Influential Factors: BMI: 40.74 kg/m Intra-procedural challenges: None observed Assessment challenges: None detected         Post-procedural side-effects, adverse reactions, or complications: None reported Reported issues: None  Sedation: Sedation provided. When no sedatives are used, the analgesic levels obtained are directly associated to the effectiveness of the local anesthetics. However, when sedation is provided, the level of analgesia obtained during the initial 1 hour following the intervention, is believed to be the result of a combination of factors. These factors may include, but are not limited to: 1. The effectiveness of the local anesthetics used. 2. The effects of the analgesic(s) and/or anxiolytic(s) used. 3. The degree of discomfort experienced by the patient at the time of the procedure. 4. The patients ability and reliability in recalling and recording the events. 5. The presence and influence of possible secondary gains and/or psychosocial factors. Reported result: Relief experienced during the 1st hour after the procedure: 100 % (Ultra-Short Term Relief) Interpretative annotation: Analgesia during this period is likely to be Local Anesthetic and/or IV Sedative (Analgesic/Anxiolitic) related.          Effects of local anesthetic: The analgesic effects attained during this period are directly associated to the localized infiltration of local anesthetics and therefore cary significant diagnostic value as to the etiological location, or anatomical origin, of the pain. Expected duration of relief is directly dependent on the pharmacodynamics of the local anesthetic used. Long-acting (4-6 hours) anesthetics used.  Reported result: Relief during the next 4 to 6 hour after the procedure: 100 % (Short-Term Relief) Interpretative annotation: Complete relief would suggest area to be the source of the pain.          Long-term  benefit: Defined as the period of time past the expected duration of local anesthetics. With the possible exception of prolonged sympathetic blockade from the local anesthetics, benefits during this period are typically attributed to, or associated with, other factors such as analgesic sensory neuropraxia, antiinflammatory effects, or beneficial biochemical changes provided by agents other than the local anesthetics Reported result: Extended relief following procedure: 50 % (patient started experiencing some pain after approximately 1 week. ) (Long-Term Relief) Interpretative annotation: Good relief. This could suggest inflammation to be a significant component in the etiology to the pain.          Current benefits: Defined as persistent relief that continues at this point in time.   Reported results: Treated area: 0 % Ms. Brant reports improvement in function Interpretative annotation: Ongoing benefits would suggest effective therapeutic approach  Interpretation: Results would suggest that repeating the procedure may be necessary,          Laboratory Chemistry  Inflammation Markers Lab Results  Component Value Date   ESRSEDRATE 52 (H) 03/26/2016   CRP 2.1 (H) 03/26/2016   Renal Function Lab Results  Component Value Date   BUN 14 03/26/2016   CREATININE 1.28 (H) 03/26/2016   GFRAA 50 (L) 03/26/2016   GFRNONAA 44 (L) 03/26/2016   Hepatic Function Lab Results  Component Value Date   AST 24 03/26/2016   ALT 20 03/26/2016   ALBUMIN 4.0 03/26/2016   Electrolytes Lab Results  Component Value Date   NA 136 03/26/2016   K 3.8 03/26/2016   CL 100 (L) 03/26/2016  CALCIUM 8.5 (L) 03/26/2016   MG 1.8 03/26/2016   Pain Modulating Vitamins Lab Results  Component Value Date   VD25OH 20.2 (L) 03/26/2016   VITAMINB12 467 03/26/2016   Coagulation Parameters Lab Results  Component Value Date   PLT 146 (L) 03/26/2016   Cardiovascular No results found for: BNP, HGB, HCT Note: Lab  results reviewed.  Recent Diagnostic Imaging Review  Dg C-arm 1-60 Min-no Report  Result Date: 11/10/2016 There is no Radiologist interpretation  for this exam.  Note: Imaging results reviewed.          Meds  The patient has a current medication list which includes the following prescription(s): albuterol, amlodipine, aspirin ec, b-d ins syr ultrafine 1cc/30g, budesonide-formoterol, fluoxetine, furosemide, hydralazine, insulin nph human, klor-con 10, losartan, lovastatin, magnesium oxide, metoprolol, metoprolol succinate, nitroglycerin, oxycodone, oxycodone, oxycodone, pregabalin, tizanidine, trazodone, and vitamin d (ergocalciferol).  Current Outpatient Prescriptions on File Prior to Visit  Medication Sig  . albuterol (PROVENTIL HFA;VENTOLIN HFA) 108 (90 Base) MCG/ACT inhaler Inhale 2 puffs into the lungs every 6 (six) hours as needed.   Marland Kitchen amLODipine (NORVASC) 10 MG tablet Take 10 mg by mouth daily.   Marland Kitchen aspirin EC 81 MG tablet Take 81 mg by mouth daily.   . B-D INS SYR ULTRAFINE 1CC/30G 30G X 1/2" 1 ML MISC   . FLUoxetine (PROZAC) 40 MG capsule Take 40 mg by mouth daily.   . furosemide (LASIX) 20 MG tablet Take 20 mg by mouth daily. 1-2 tabs daily PRN  . insulin NPH Human (HUMULIN N,NOVOLIN N) 100 UNIT/ML injection NPH inject SQ bid 50 units  . KLOR-CON 10 10 MEQ tablet Take 10 mEq by mouth daily.   Marland Kitchen losartan (COZAAR) 100 MG tablet TAKE 1 TABLET (100 MG TOTAL) BY MOUTH ONCE DAILY.  Marland Kitchen lovastatin (MEVACOR) 10 MG tablet Take 10 mg by mouth at bedtime.  . magnesium oxide (MAG-OX) 400 (241.3 Mg) MG tablet TAKE 1 TABLET (400 MG TOTAL) BY MOUTH DAILY.  . metoprolol (LOPRESSOR) 50 MG tablet Take 50 mg by mouth daily.  . metoprolol succinate (TOPROL-XL) 50 MG 24 hr tablet Take 25 mg by mouth daily.   . nitroGLYCERIN (NITROSTAT) 0.3 MG SL tablet 1 tablet sublingual prn for ESOPHAGEAL SPASMS(may repeat every 5 min, seek med help if pain persists after 3 tablets)  . traZODone (DESYREL) 100 MG  tablet Take 200 mg by mouth at bedtime.   . Vitamin D, Ergocalciferol, (DRISDOL) 50000 units CAPS capsule Take 50,000 Units by mouth every 30 (thirty) days.    No current facility-administered medications on file prior to visit.    ROS  Constitutional: Denies any fever or chills Gastrointestinal: No reported hemesis, hematochezia, vomiting, or acute GI distress Musculoskeletal: Denies any acute onset joint swelling, redness, loss of ROM, or weakness Neurological: No reported episodes of acute onset apraxia, aphasia, dysarthria, agnosia, amnesia, paralysis, loss of coordination, or loss of consciousness  Allergies  Ms. Brathwaite is allergic to enbrel [etanercept]; metformin; methotrexate; mushroom extract complex; other; darvon [propoxyphene]; minocycline; mtx support [cobalamine combinations]; and ciprofloxacin.  PFSH  Drug: Ms. Aguado  reports that she does not use drugs. Alcohol:  reports that she does not drink alcohol. Tobacco:  reports that she has quit smoking. She has quit using smokeless tobacco. Medical:  has a past medical history of Arthritis; Cervical spondylosis without myelopathy (09/21/2015); Chronic pain; Chronic pain associated with significant psychosocial dysfunction (08/28/2013); Depression; Diabetes mellitus without complication ([place]); Displacement of cervical intervertebral disc without myelopathy (09/05/2015); Fibromyalgia;  GERD (gastroesophageal reflux disease); Hypertension; Kidney disease, chronic, stage III (GFR 30-59 ml/min); Sarcoid ([place]); and Thrombopenia ([place]). Family: family history includes Alcohol abuse in her brother; Alzheimer's disease in her mother; COPD in her father; Diabetes in her father; Heart disease in her father.  Past Surgical History:  Procedure Laterality Date  . ABDOMINAL HYSTERECTOMY    . BACK SURGERY    . BREAST SURGERY    . FRACTURE SURGERY     nose  . HAND SURGERY    . KNEE ARTHROSCOPY     right  . neck fusion     Constitutional Exam   General appearance: Well nourished, well developed, and well hydrated. In no apparent acute distress Vitals:   12/16/16 0850  BP: (!) 132/43  Pulse: 70  Resp: 16  Temp: 98.5 F (36.9 C)  TempSrc: Oral  SpO2: 99%  Weight: 230 lb (104.3 kg)  Height: _0  (1.6 m)   BMI Assessment: Estimated body mass index is 40.74 kg/m as calculated from the following:   Height as of this encounter: _1  (1.6 m).   Weight as of this encounter: 230 lb (104.3 kg).  BMI interpretation table: BMI level Category Range association with higher incidence of chronic pain  <18 kg/m2 Underweight   18.5-24.9 kg/m2 Ideal body weight   25-29.9 kg/m2 Overweight Increased incidence by 20%  30-34.9 kg/m2 Obese (Class I) Increased incidence by 68%  35-39.9 kg/m2 Severe obesity (Class II) Increased incidence by 136%  >40 kg/m2 Extreme obesity (Class III) Increased incidence by 254%   BMI Readings from Last 4 Encounters:  12/16/16 40.74 kg/m  11/10/16 39.86 kg/m  09/15/16 38.97 kg/m  07/30/16 38.97 kg/m   Wt Readings from Last 4 Encounters:  12/16/16 230 lb (104.3 kg)  11/10/16 225 lb (102.1 kg)  09/15/16 220 lb (99.8 kg)  07/30/16 220 lb (99.8 kg)  Psych/Mental status: Alert, oriented x 3 (person, place, & time) Eyes: PERLA Respiratory: No evidence of acute respiratory distress  Cervical Spine Exam  Inspection: No masses, redness, or swelling Alignment: Symmetrical Functional ROM: Unrestricted ROM Stability: No instability detected Muscle strength & Tone: Functionally intact Sensory: Unimpaired Palpation: Non-contributory  Upper Extremity (UE) Exam    Side: Right upper extremity  Side: Left upper extremity  Inspection: No masses, redness, swelling, or asymmetry  Inspection: No masses, redness, swelling, or asymmetry  Functional ROM: Unrestricted ROM          Functional ROM: Unrestricted ROM          Muscle strength & Tone: Functionally intact  Muscle strength & Tone: Functionally intact   Sensory: Unimpaired  Sensory: Unimpaired  Palpation: Non-contributory  Palpation: Non-contributory   Thoracic Spine Exam  Inspection: No masses, redness, or swelling Alignment: Symmetrical Functional ROM: Unrestricted ROM Stability: No instability detected Sensory: Unimpaired Muscle strength & Tone: Functionally intact Palpation: Non-contributory  Lumbar Spine Exam  Inspection: No masses, redness, or swelling Alignment: Symmetrical Functional ROM: Unrestricted ROM Stability: No instability detected Muscle strength & Tone: Functionally intact Sensory: Unimpaired Palpation: Non-contributory Provocative Tests: Lumbar Hyperextension and rotation test: evaluation deferred today       Patrick's Maneuver: evaluation deferred today              Gait & Posture Assessment  Ambulation: Unassisted Gait: Relatively normal for age and body habitus Posture: WNL   Lower Extremity Exam    Side: Right lower extremity  Side: Left lower extremity  Inspection: No masses, redness, swelling, or asymmetry  Inspection: No  masses, redness, swelling, or asymmetry  Functional ROM: Unrestricted ROM          Functional ROM: Unrestricted ROM          Muscle strength & Tone: Functionally intact  Muscle strength & Tone: Functionally intact  Sensory: Unimpaired  Sensory: Unimpaired  Palpation: Non-contributory  Palpation: Non-contributory   Assessment  Primary Diagnosis & Pertinent Problem List: The primary encounter diagnosis was Chronic pain syndrome. Diagnoses of Fibromyalgia, Myofascial pain, Long term prescription opiate use, Opiate use (22.5 MME/Day), Chronic neck pain (Location of Secondary source of pain) (Bilateral) (L>R), and Chronic upper back pain were also pertinent to this visit.  Status Diagnosis  Controlled Controlled Controlled 1. Chronic pain syndrome   2. Fibromyalgia   3. Myofascial pain   4. Long term prescription opiate use   5. Opiate use (22.5 MME/Day)   6. Chronic neck pain  (Location of Secondary source of pain) (Bilateral) (L>R)   7. Chronic upper back pain      Plan of Care  Pharmacotherapy (Medications Ordered): Meds ordered this encounter  Medications  . oxyCODONE (OXY IR/ROXICODONE) 5 MG immediate release tablet    Sig: Take 1 tablet (5 mg total) by mouth every 8 (eight) hours as needed for moderate pain or severe pain.    Dispense:  90 tablet    Refill:  0    Do not place this medication, or any other prescription from our practice, on "Automatic Refill". Patient may have prescription filled one day early if pharmacy is closed on scheduled refill date. Do not fill until: 12/26/16 To last until: 01/25/17  . oxyCODONE (OXY IR/ROXICODONE) 5 MG immediate release tablet    Sig: Take 1 tablet (5 mg total) by mouth every 8 (eight) hours as needed for moderate pain or severe pain.    Dispense:  90 tablet    Refill:  0    Do not place this medication, or any other prescription from our practice, on "Automatic Refill". Patient may have prescription filled one day early if pharmacy is closed on scheduled refill date. Do not fill until: 01/25/17 To last until: 02/24/17  . oxyCODONE (OXY IR/ROXICODONE) 5 MG immediate release tablet    Sig: Take 1 tablet (5 mg total) by mouth every 8 (eight) hours as needed for moderate pain or severe pain.    Dispense:  90 tablet    Refill:  0    Do not place this medication, or any other prescription from our practice, on "Automatic Refill". Patient may have prescription filled one day early if pharmacy is closed on scheduled refill date. Do not fill until: 02/24/17 To last until: 03/26/17  . pregabalin (LYRICA) 150 MG capsule    Sig: Take 1 capsule (150 mg total) by mouth 2 (two) times daily.    Dispense:  60 capsule    Refill:  2    Do not place this medication, or any other prescription from our practice, on "Automatic Refill". Patient may have prescription filled one day early if pharmacy is closed on scheduled refill  date.  Marland Kitchen tiZANidine (ZANAFLEX) 4 MG tablet    Sig: Take 1 tablet (4 mg total) by mouth 2 (two) times daily as needed for muscle spasms.    Dispense:  60 tablet    Refill:  2    Do not place this medication, or any other prescription from our practice, on "Automatic Refill". Patient may have prescription filled one day early if pharmacy is closed on scheduled  refill date.   New Prescriptions   No medications on file   Medications administered today: Ms. Braaksma had no medications administered during this visit. Lab-work, procedure(s), and/or referral(s): Orders Placed This Encounter  Procedures  . Cervical Epidural Injection  . ToxASSURE Select 13 (MW), Urine   Imaging and/or referral(s): None  Interventional therapies: Planned, scheduled, and/or pending:   Left sided cervical epidural steroid injection under fluoroscopic guidance and IV sedation    Considering:   Diagnostic left sided cervical facet block #2 under fluoroscopic guidance and IV sedation. Diagnostic/palliative bilateral lumbar facet block under fluoroscopic guidance and IV sedation. Possible bilateral lumbar facet radiofrequency ablation under fluoroscopic guidance and IV sedation. Diagnostic/palliative left cervical facet block under fluoroscopic guidance and IV sedation.  Possible left-sided cervical facet radiofrequency ablation under fluoroscopic guidance and IV sedation.    Palliative PRN treatment(s):   Diagnostic/palliative bilateral lumbar facet block under fluoroscopic guidance and IV sedation. Diagnostic/palliative left cervical facet block under fluoroscopic guidance and IV sedation.    Provider-requested follow-up: Return in about 3 months (around 03/16/2017) for (MD) Med-Mgmt, in addition, procedure (ASAP): CESI.  Future Appointments Date Time Provider [place]  12/22/2016 8:00 AM Milinda Pointer, MD [facility] None   Primary Care Physician: Sharyne Peach, MD Location: [facility] Outpatient Pain  Management Facility Note by: Kathlen Brunswick. Dossie Arbour, M.D, DABA, DABAPM, DABPM, DABIPP, FIPP Date: 12/16/2016; Time: 10:26 AM  Pain Score Disclaimer: We use the NRS-11 scale. This is a self-reported, subjective measurement of pain severity with only modest accuracy. It is used primarily to identify changes within a particular patient. It must be understood that outpatient pain scales are significantly less accurate that those used for research, where they can be applied under ideal controlled circumstances with minimal exposure to variables. In reality, the score is likely to be a combination of pain intensity and pain affect, where pain affect describes the degree of emotional arousal or changes in action readiness caused by the sensory experience of pain. Factors such as social and work situation, setting, emotional state, anxiety levels, expectation, and prior pain experience may influence pain perception and show large inter-individual differences that may also be affected by time variables.  Patient instructions provided during this appointment: Patient Instructions  Prescriptions for Lyrica and Zanaflex were sent to your pharmacy. You were given 2 prescriptions for Oxycodone today.Epidural Steroid Injection Patient Information  Description: The epidural space surrounds the nerves as they exit the spinal cord.  In some patients, the nerves can be compressed and inflamed by a bulging disc or a tight spinal canal (spinal stenosis).  By injecting steroids into the epidural space, we can bring irritated nerves into direct contact with a potentially helpful medication.  These steroids act directly on the irritated nerves and can reduce swelling and inflammation which often leads to decreased pain.  Epidural steroids may be injected anywhere along the spine and from the neck to the low back depending upon the location of your pain.   After numbing the skin with local anesthetic (like Novocaine), a small  needle is passed into the epidural space slowly.  You may experience a sensation of pressure while this is being done.  The entire block usually last less than 10 minutes.  Conditions which may be treated by epidural steroids:   Low back and leg pain  Neck and arm pain  Spinal stenosis  Post-laminectomy syndrome  Herpes zoster (shingles) pain  Pain from compression fractures  Preparation for the injection:  1. Do  not eat any solid food or dairy products within 8 hours of your appointment.  2. You may drink clear liquids up to 3 hours before appointment.  Clear liquids include water, black coffee, juice or soda.  No milk or cream please. 3. You may take your regular medication, including pain medications, with a sip of water before your appointment  Diabetics should hold regular insulin (if taken separately) and take 1/2 normal NPH dos the morning of the procedure.  Carry some sugar containing items with you to your appointment. 4. A driver must accompany you and be prepared to drive you home after your procedure.  5. Bring all your current medications with your. 6. An IV may be inserted and sedation may be given at the discretion of the physician.   7. A blood pressure cuff, EKG and other monitors will often be applied during the procedure.  Some patients may need to have extra oxygen administered for a short period. 8. You will be asked to provide medical information, including your allergies, prior to the procedure.  We must know immediately if you are taking blood thinners (like Coumadin/Warfarin)  Or if you are allergic to IV iodine contrast (dye). We must know if you could possible be pregnant.  Possible side-effects:  Bleeding from needle site  Infection (rare, may require surgery)  Nerve injury (rare)  Numbness & tingling (temporary)  Difficulty urinating (rare, temporary)  Spinal headache ( a headache worse with upright posture)  Light -headedness (temporary)  Pain  at injection site (several days)  Decreased blood pressure (temporary)  Weakness in arm/leg (temporary)  Pressure sensation in back/neck (temporary)  Call if you experience:  Fever/chills associated with headache or increased back/neck pain.  Headache worsened by an upright position.  New onset weakness or numbness of an extremity below the injection site  Hives or difficulty breathing (go to the emergency room)  Inflammation or drainage at the infection site  Severe back/neck pain  Any new symptoms which are concerning to you  Please note:  Although the local anesthetic injected can often make your back or neck feel good for several hours after the injection, the pain will likely return.  It takes 3-7 days for steroids to work in the epidural space.  You may not notice any pain relief for at least that one week.  If effective, we will often do a series of three injections spaced 3-6 weeks apart to maximally decrease your pain.  After the initial series, we generally will wait several months before considering a repeat injection of the same type.  If you have any questions, please call [phone] [facility]

## 2016-12-16 NOTE — Progress Notes (Signed)
Nursing Pain Medication Assessment:  Safety precautions to be maintained throughout the outpatient stay will include: orient to surroundings, keep bed in low position, maintain call bell within reach at all times, provide assistance with transfer out of bed and ambulation.  Medication Inspection Compliance: Pill count conducted under aseptic conditions, in front of the patient. Neither the pills nor the bottle was removed from the patient's sight at any time. Once count was completed pills were immediately returned to the patient in their original bottle.  Medication: See above Pill Count: 56 of 90 pills remain Bottle Appearance: Standard pharmacy container. Clearly labeled. Filled Date: 01 / 04 / 2017 Medication last intake:12/15/16

## 2016-12-16 NOTE — Patient Instructions (Addendum)
Prescriptions for Lyrica and Zanaflex were sent to your pharmacy. You were given 2 prescriptions for Oxycodone today.Epidural Steroid Injection Patient Information  Description: The epidural space surrounds the nerves as they exit the spinal cord.  In some patients, the nerves can be compressed and inflamed by a bulging disc or a tight spinal canal (spinal stenosis).  By injecting steroids into the epidural space, we can bring irritated nerves into direct contact with a potentially helpful medication.  These steroids act directly on the irritated nerves and can reduce swelling and inflammation which often leads to decreased pain.  Epidural steroids may be injected anywhere along the spine and from the neck to the low back depending upon the location of your pain.   After numbing the skin with local anesthetic (like Novocaine), a small needle is passed into the epidural space slowly.  You may experience a sensation of pressure while this is being done.  The entire block usually last less than 10 minutes.  Conditions which may be treated by epidural steroids:   Low back and leg pain  Neck and arm pain  Spinal stenosis  Post-laminectomy syndrome  Herpes zoster (shingles) pain  Pain from compression fractures  Preparation for the injection:  1. Do not eat any solid food or dairy products within 8 hours of your appointment.  2. You may drink clear liquids up to 3 hours before appointment.  Clear liquids include water, black coffee, juice or soda.  No milk or cream please. 3. You may take your regular medication, including pain medications, with a sip of water before your appointment  Diabetics should hold regular insulin (if taken separately) and take 1/2 normal NPH dos the morning of the procedure.  Carry some sugar containing items with you to your appointment. 4. A driver must accompany you and be prepared to drive you home after your procedure.  5. Bring all your current medications with  your. 6. An IV may be inserted and sedation may be given at the discretion of the physician.   7. A blood pressure cuff, EKG and other monitors will often be applied during the procedure.  Some patients may need to have extra oxygen administered for a short period. 8. You will be asked to provide medical information, including your allergies, prior to the procedure.  We must know immediately if you are taking blood thinners (like Coumadin/Warfarin)  Or if you are allergic to IV iodine contrast (dye). We must know if you could possible be pregnant.  Possible side-effects:  Bleeding from needle site  Infection (rare, may require surgery)  Nerve injury (rare)  Numbness & tingling (temporary)  Difficulty urinating (rare, temporary)  Spinal headache ( a headache worse with upright posture)  Light -headedness (temporary)  Pain at injection site (several days)  Decreased blood pressure (temporary)  Weakness in arm/leg (temporary)  Pressure sensation in back/neck (temporary)  Call if you experience:  Fever/chills associated with headache or increased back/neck pain.  Headache worsened by an upright position.  New onset weakness or numbness of an extremity below the injection site  Hives or difficulty breathing (go to the emergency room)  Inflammation or drainage at the infection site  Severe back/neck pain  Any new symptoms which are concerning to you  Please note:  Although the local anesthetic injected can often make your back or neck feel good for several hours after the injection, the pain will likely return.  It takes 3-7 days for steroids to work in the  epidural space.  You may not notice any pain relief for at least that one week.  If effective, we will often do a series of three injections spaced 3-6 weeks apart to maximally decrease your pain.  After the initial series, we generally will wait several months before considering a repeat injection of the same  type.  If you have any questions, please call [phone] [facility]

## 2016-12-22 ENCOUNTER — Ambulatory Visit
Admission: RE | Admit: 2016-12-22 | Discharge: 2016-12-22 | Disposition: A | Discharge status: Home / Self Care | Payer: Medicare Other | Source: Ambulatory Visit | Attending: Pain Medicine | Admitting: Pain Medicine

## 2016-12-22 ENCOUNTER — Other Ambulatory Visit: Payer: Self-pay | Admitting: Pain Medicine

## 2016-12-22 ENCOUNTER — Ambulatory Visit (HOSPITAL_BASED_OUTPATIENT_CLINIC_OR_DEPARTMENT_OTHER): Payer: Medicare Other | Admitting: Pain Medicine

## 2016-12-22 ENCOUNTER — Encounter: Payer: Self-pay | Admitting: Pain Medicine

## 2016-12-22 VITALS — BP 120/54 | HR 64 | Temp 96.7°F | Resp 16 | Ht 63.0 in | Wt 230.0 lb

## 2016-12-22 DIAGNOSIS — R52 Pain, unspecified: Secondary | ICD-10-CM | POA: Diagnosis present

## 2016-12-22 DIAGNOSIS — G8929 Other chronic pain: Secondary | ICD-10-CM | POA: Diagnosis not present

## 2016-12-22 DIAGNOSIS — M549 Dorsalgia, unspecified: Secondary | ICD-10-CM

## 2016-12-22 DIAGNOSIS — M4722 Other spondylosis with radiculopathy, cervical region: Secondary | ICD-10-CM

## 2016-12-22 DIAGNOSIS — M1288 Other specific arthropathies, not elsewhere classified, other specified site: Secondary | ICD-10-CM | POA: Diagnosis not present

## 2016-12-22 DIAGNOSIS — M47812 Spondylosis without myelopathy or radiculopathy, cervical region: Secondary | ICD-10-CM

## 2016-12-22 DIAGNOSIS — M5489 Other dorsalgia: Secondary | ICD-10-CM | POA: Insufficient documentation

## 2016-12-22 DIAGNOSIS — M542 Cervicalgia: Secondary | ICD-10-CM | POA: Diagnosis not present

## 2016-12-22 MED ORDER — LIDOCAINE HCL (PF) 1 % IJ SOLN
10.0000 mL | Freq: Once | INTRAMUSCULAR | Status: AC
  Administered 2016-12-22: 10 mL
  Filled 2016-12-22: qty 10

## 2016-12-22 MED ORDER — SODIUM CHLORIDE 0.9% FLUSH
1.0000 mL | Freq: Once | INTRAVENOUS | Status: AC
  Administered 2016-12-22: 1 mL

## 2016-12-22 MED ORDER — ROPIVACAINE HCL 2 MG/ML IJ SOLN
1.0000 mL | Freq: Once | INTRAMUSCULAR | Status: AC
  Administered 2016-12-22: 1 mL via EPIDURAL
  Filled 2016-12-22: qty 10

## 2016-12-22 MED ORDER — SODIUM CHLORIDE 0.9 % IJ SOLN
INTRAMUSCULAR | Status: AC
  Administered 2016-12-22: 09:00:00
  Filled 2016-12-22: qty 10

## 2016-12-22 MED ORDER — DEXAMETHASONE SODIUM PHOSPHATE 10 MG/ML IJ SOLN
10.0000 mg | Freq: Once | INTRAMUSCULAR | Status: AC
  Administered 2016-12-22: 10 mg
  Filled 2016-12-22: qty 1

## 2016-12-22 MED ORDER — FENTANYL CITRATE (PF) 100 MCG/2ML IJ SOLN
25.0000 ug | INTRAMUSCULAR | Status: DC | PRN
  Administered 2016-12-22: 50 ug via INTRAVENOUS
  Filled 2016-12-22: qty 2

## 2016-12-22 MED ORDER — DEXAMETHASONE SODIUM PHOSPHATE 4 MG/ML IJ SOLN
INTRAMUSCULAR | Status: AC
  Administered 2016-12-22: 09:00:00
  Filled 2016-12-22: qty 3

## 2016-12-22 MED ORDER — LACTATED RINGERS IV SOLN
1000.0000 mL | Freq: Once | INTRAVENOUS | Status: AC
  Administered 2016-12-22: 1000 mL via INTRAVENOUS

## 2016-12-22 MED ORDER — MIDAZOLAM HCL 5 MG/5ML IJ SOLN
1.0000 mg | INTRAMUSCULAR | Status: DC | PRN
  Administered 2016-12-22: 3 mg via INTRAVENOUS
  Filled 2016-12-22: qty 5

## 2016-12-22 MED ORDER — IOPAMIDOL (ISOVUE-M 200) INJECTION 41%
10.0000 mL | Freq: Once | INTRAMUSCULAR | Status: AC
  Administered 2016-12-22: 10 mL via EPIDURAL
  Filled 2016-12-22: qty 10

## 2016-12-22 NOTE — Patient Instructions (Signed)
Pain Management Discharge Instructions  General Discharge Instructions :  If you need to reach your doctor call: Monday-Friday 8:00 am - 4:00 pm at [phone] or toll free [phone].  After clinic hours [phone] to have operator reach doctor.  Bring all of your medication bottles to all your appointments in the pain clinic.  To cancel or reschedule your appointment with Pain Management please remember to call 24 hours in advance to avoid a fee.  Refer to the educational materials which you have been given on: General Risks, I had my Procedure. Discharge Instructions, Post Sedation.  Post Procedure Instructions:  The drugs you were given will stay in your system until tomorrow, so for the next 24 hours you should not drive, make any legal decisions or drink any alcoholic beverages.  You may eat anything you prefer, but it is better to start with liquids then soups and crackers, and gradually work up to solid foods.  Please notify your doctor immediately if you have any unusual bleeding, trouble breathing or pain that is not related to your normal pain.  Depending on the type of procedure that was done, some parts of your body may feel week and/or numb.  This usually clears up by tonight or the next day.  Walk with the use of an assistive device or accompanied by an adult for the 24 hours.  You may use ice on the affected area for the first 24 hours.  Put ice in a Ziploc bag and cover with a towel and place against area 15 minutes on 15 minutes off.  You may switch to heat after 24 hours.Epidural Steroid Injection An epidural steroid injection is a shot of steroid medicine and numbing medicine that is given into the space between the spinal cord and the bones in your back (epidural space). The shot helps relieve pain caused by an irritated or swollen nerve root. The amount of pain relief you get from the injection depends on what is causing the nerve to be swollen and irritated,  and how long your pain lasts. You are more likely to benefit from this injection if your pain is strong and comes on suddenly rather than if you have had pain for a long time. Tell a health care provider about:  Any allergies you have.  All medicines you are taking, including vitamins, herbs, eye drops, creams, and over-the-counter medicines.  Any problems you or family members have had with anesthetic medicines.  Any blood disorders you have.  Any surgeries you have had.  Any medical conditions you have.  Whether you are pregnant or may be pregnant. What are the risks? Generally, this is a safe procedure. However, problems may occur, including:  Headache.  Bleeding.  Infection.  Allergic reaction to medicines.  Damage to your nerves. What happens before the procedure? Staying hydrated  Follow instructions from your health care provider about hydration, which may include:  Up to 2 hours before the procedure - you may continue to drink clear liquids, such as water, clear fruit juice, black coffee, and plain tea. Eating and drinking restrictions  Follow instructions from your health care provider about eating and drinking, which may include:  8 hours before the procedure - stop eating heavy meals or foods such as meat, fried foods, or fatty foods.  6 hours before the procedure - stop eating light meals or foods, such as toast or cereal.  6 hours before the procedure - stop drinking milk or drinks that contain milk.    2 hours before the procedure - stop drinking clear liquids. Medicine  You may be given medicines to lower anxiety.  Ask your health care provider about:  Changing or stopping your regular medicines. This is especially important if you are taking diabetes medicines or blood thinners.  Taking medicines such as aspirin and ibuprofen. These medicines can thin your blood. Do not take these medicines before your procedure if your health care provider instructs  you not to. General instructions  Plan to have someone take you home from the hospital or clinic. What happens during the procedure?  You may receive a medicine to help you relax (sedative).  You will be asked to lie on your abdomen.  The injection site will be cleaned.  A numbing medicine (local anesthetic) will be used to numb the injection site.  A needle will be inserted through your skin into the epidural space. You may feel some discomfort when this happens. An X-ray machine will be used to make sure the needle is put as close as possible to the affected nerve.  A steroid medicine and a local anesthetic will be injected into the epidural space.  The needle will be removed.  A bandage (dressing) will be put over the injection site. What happens after the procedure?  Your blood pressure, heart rate, breathing rate, and blood oxygen level will be monitored until the medicines you were given have worn off.  Your arm or leg may feel weak or numb for a few hours.  The injection site may feel sore.  Do not drive for 24 hours if you received a sedative. This information is not intended to replace advice given to you by your health care provider. Make sure you discuss any questions you have with your health care provider. Document Released: 02/24/2008 Document Revised: 04/30/2016 Document Reviewed: 03/04/2016 [employer] Interactive Patient Education  2017 [employer]  

## 2016-12-22 NOTE — Progress Notes (Signed)
Patient's Name: Amber Acosta  MRN: [id number]  Referring Provider: Delano Metz, MD  DOB: [birth year]  PCP: Rayetta Humphrey, MD  DOS: 12/22/2016  Note by: Sydnee Levans. Laban Emperor, MD  Service setting: Ambulatory outpatient  Location: [facility] (AMB) Pain Management Facility  Visit type: Procedure  Specialty: Interventional Pain Management  Patient type: Established   Primary Reason for Visit: Interventional Pain Management Treatment. CC: Shoulder Pain (across shoulders)  Procedure:  Anesthesia, Analgesia, Anxiolysis:  Type: Therapeutic, Inter-Laminar, Epidural Steroid Injection Region: Posterior Cervico-thoracic Region Level: C7-T1 Laterality: Left-Sided Paramedial  Type: Local Anesthesia with Moderate (Conscious) Sedation Local Anesthetic: Lidocaine 1% Route: Intravenous (IV) IV Access: Secured Sedation: Meaningful verbal contact was maintained at all times during the procedure  Indication(s): Analgesia and Anxiety  Indications: 1. Osteoarthritis of spine with radiculopathy, cervical region   2. Chronic neck pain (Location of Secondary source of pain) (Bilateral) (L>R)   3. Chronic upper back pain   4. Cervical facet syndrome   5. Cervical facet syndrome (L)    Pain Score: Pre-procedure: 1 /10 Post-procedure: 0-No pain/10  Pre-op Assessment:  Previous date of service: 12/16/16 Service provided: Med Refill Amber Acosta is a 66 y.o. (year old), female patient, seen today for interventional treatment. She  has a past surgical history that includes neck fusion; Breast surgery; Back surgery; Knee arthroscopy; Fracture surgery; Hand surgery; and Abdominal hysterectomy. Her primarily concern today is the Shoulder Pain (across shoulders)  Initial Vital Signs: Blood pressure (!) 129/54, pulse 81, temperature 98.1 F (36.7 C), resp. rate 16, height 5\' 3"  (1.6 m), weight 230 lb (104.3 kg), SpO2 97 %. BMI: 40.74 kg/m  Risk Assessment: Allergies: Reviewed. She is allergic to enbrel [etanercept];  metformin; methotrexate; mushroom extract complex; other; darvon [propoxyphene]; minocycline; mtx support [cobalamine combinations]; and ciprofloxacin. Allergy Precautions: None required Coagulopathies: "Reviewed. None identified.  Blood-thinner therapy: None at this time Active Infection(s): Reviewed. None identified. Ms. Labarre is afebrile  Site Confirmation: Ms. Wilds was asked to confirm the procedure and laterality before marking the site Procedure checklist: Completed Consent: Before the procedure and under the influence of no sedative(s), amnesic(s), or anxiolytics, the patient was informed of the treatment options, risks and possible complications. To fulfill our ethical and legal obligations, as recommended by the American Medical Association's Code of Ethics, I have informed the patient of my clinical impression; the nature and purpose of the treatment or procedure; the risks, benefits, and possible complications of the intervention; the alternatives, including doing nothing; the risk(s) and benefit(s) of the alternative treatment(s) or procedure(s); and the risk(s) and benefit(s) of doing nothing. The patient was provided information about the general risks and possible complications associated with the procedure. These may include, but are not limited to: failure to achieve desired goals, infection, bleeding, organ or nerve damage, allergic reactions, paralysis, and death. In addition, the patient was informed of those risks and complications associated to Spine-related procedures, such as failure to decrease pain; infection (i.e.: Meningitis, epidural or intraspinal abscess); bleeding (i.e.: epidural hematoma, subarachnoid hemorrhage, or any other type of intraspinal or peri-dural bleeding); organ or nerve damage (i.e.: Any type of peripheral nerve, nerve root, or spinal cord injury) with subsequent damage to sensory, motor, and/or autonomic systems, resulting in permanent pain, numbness, and/or  weakness of one or several areas of the body; allergic reactions; (i.e.: anaphylactic reaction); and/or death. Furthermore, the patient was informed of those risks and complications associated with the medications. These include, but are not limited to: allergic reactions (i.e.: anaphylactic  or anaphylactoid reaction(s)); adrenal axis suppression; blood sugar elevation that in diabetics may result in ketoacidosis or comma; water retention that in patients with history of congestive heart failure may result in shortness of breath, pulmonary edema, and decompensation with resultant heart failure; weight gain; swelling or edema; medication-induced neural toxicity; particulate matter embolism and blood vessel occlusion with resultant organ, and/or nervous system infarction; and/or aseptic necrosis of one or more joints. Finally, the patient was informed that Medicine is not an exact science; therefore, there is also the possibility of unforeseen or unpredictable risks and/or possible complications that may result in a catastrophic outcome. The patient indicated having understood very clearly. We have given the patient no guarantees and we have made no promises. Enough time was given to the patient to ask questions, all of which were answered to the patient's satisfaction. Ms. Luttrull has indicated that she wanted to continue with the procedure. Attestation: I, the ordering provider, attest that I have discussed with the patient the benefits, risks, side-effects, alternatives, likelihood of achieving goals, and potential problems during recovery for the procedure that I have provided informed consent. Date: 12/22/2016; Time: 8:12 AM  Pre-Procedure Preparation:  Monitoring: As per clinic protocol. Respiration, ETCO2, SpO2, BP, heart rate and rhythm monitor placed and checked for adequate function Safety Precautions: Patient was assessed for positional comfort and pressure points before starting the  procedure. Time-out: I initiated and conducted the "Time-out" before starting the procedure, as per protocol. The patient was asked to participate by confirming the accuracy of the "Time Out" information. Verification of the correct person, site, and procedure were performed and confirmed by me, the nursing staff, and the patient. "Time-out" conducted as per Joint Commission's Universal Protocol (UP.01.01.01). "Time-out" Date & Time: 12/22/2016; 0852 hrs.  Description of Procedure Process:   Position: Prone with head of the table was raised to facilitate breathing. Target Area: For Epidural Steroid injections the target is the interlaminar space, initially targeting the lower border of the superior vertebral body lamina. Approach: Paramedial approach. Area Prepped: Entire PosteriorCervical Region Prepping solution: ChloraPrep (2% chlorhexidine gluconate and 70% isopropyl alcohol) Safety Precautions: Aspiration looking for blood return was conducted prior to all injections. At no point did we inject any substances, as a needle was being advanced. No attempts were made at seeking any paresthesias. Safe injection practices and needle disposal techniques used. Medications properly checked for expiration dates. SDV (single dose vial) medications used. Description of the Procedure: Protocol guidelines were followed. The procedure needle was introduced through the skin, ipsilateral to the reported pain, and advanced to the target area. Bone was contacted and the needle walked caudad, until the lamina was cleared. The epidural space was identified using "loss-of-resistance technique" with 2-3 ml of PF-NaCl (0.9% NSS), in a 5cc LOR glass syringe. Start Time: 0852 hrs. End Time: 0901 hrs. Materials:  Needle(s) Type: Epidural needle Gauge: 22G Length: 3.5-in Medication(s): We administered dexamethasone, iopamidol, lidocaine (PF), sodium chloride flush, ropivacaine (PF) 2 mg/mL (0.2%), lactated ringers, sodium  chloride, and dexamethasone. Please see chart orders for dosing details.  Imaging Guidance (Spinal):  Type of Imaging Technique: Fluoroscopy Guidance (Spinal) Indication(s): Assistance in needle guidance and placement for procedures requiring needle placement in or near specific anatomical locations not easily accessible without such assistance. Exposure Time: Please see nurses notes. Contrast: Before injecting any contrast, we confirmed that the patient did not have an allergy to iodine, shellfish, or radiological contrast. Once satisfactory needle placement was completed at the desired level, radiological  contrast was injected. Contrast injected under live fluoroscopy. No contrast complications. See chart for type and volume of contrast used. Fluoroscopic Guidance: I was personally present during the use of fluoroscopy. "Tunnel Vision Technique" used to obtain the best possible view of the target area. Parallax error corrected before commencing the procedure. "Direction-depth-direction" technique used to introduce the needle under continuous pulsed fluoroscopy. Once target was reached, antero-posterior, oblique, and lateral fluoroscopic projection used confirm needle placement in all planes. Images permanently stored in EMR. Interpretation: I personally interpreted the imaging intraoperatively. Adequate needle placement confirmed in multiple planes. Appropriate spread of contrast into desired area was observed. No evidence of afferent or efferent intravascular uptake. No intrathecal or subarachnoid spread observed. Permanent images saved into the patient's record.  Antibiotic Prophylaxis:  Indication(s): None identified Antibiotic given: None  Post-operative Assessment:  EBL: None Complications: No immediate post-treatment complications observed by team, or reported by patient. Note: The patient tolerated the entire procedure well. A repeat set of vitals were taken after the procedure and the  patient was kept under observation following institutional policy, for this type of procedure. Post-procedural neurological assessment was performed, showing return to baseline, prior to discharge. The patient was provided with post-procedure discharge instructions, including a section on how to identify potential problems. Should any problems arise concerning this procedure, the patient was given instructions to immediately contact us, at any time, without hesitation. In any case, we plan to contact the patient by telephone for a follow-up status report regarding this interventional procedure. Comments:  No additional relevant information.  Plan of Care  Disposition: Discharge home  Discharge Date & Time: 12/22/2016; 0933 hrs.  Physician-requested Follow-up:  Return in about 2 weeks (around 01/05/2017) for Post-Procedure evaluation.  Future Appointments Date Time Provider Department Center  01/20/2017 8:45 AM Delano MetzFrancisco Mancel Lardizabal, MD [facility] None   Medications ordered for procedure: Meds ordered this encounter  Medications  . dexamethasone (DECADRON) injection 10 mg  . iopamidol (ISOVUE-M) 41 % intrathecal injection 10 mL  . lidocaine (PF) (XYLOCAINE) 1 % injection 10 mL  . sodium chloride flush (NS) 0.9 % injection 1 mL  . ropivacaine (PF) 2 mg/mL (0.2%) (NAROPIN) injection 1 mL  . lactated ringers infusion 1,000 mL  . fentaNYL (SUBLIMAZE) injection 25-50 mcg    Make sure Narcan is available in the pyxis when using this medication. In the event of respiratory depression (RR< 8/min): Titrate NARCAN (naloxone) in increments of 0.1 to 0.2 mg IV at 2-3 minute intervals, until desired degree of reversal.  . midazolam (VERSED) 5 MG/5ML injection 1-2 mg    Make sure Flumazenil is available in the pyxis when using this medication. If oversedation occurs, administer 0.2 mg IV over 15 sec. If after 45 sec no response, administer 0.2 mg again over 1 min; may repeat at 1 min intervals; not to exceed 4  doses (1 mg)  . sodium chloride 0.9 % injection    Vivia EwingWheatley, Dena: cabinet override  . dexamethasone (DECADRON) 4 MG/ML injection    Vivia EwingWheatley, Dena: cabinet override   Medications administered: We administered dexamethasone, iopamidol, lidocaine (PF), sodium chloride flush, ropivacaine (PF) 2 mg/mL (0.2%), lactated ringers, sodium chloride, and dexamethasone.  See the medical record for exact dosing, route, and time of administration.  Lab-work, Procedure(s), & Referral(s) Ordered: Orders Placed This Encounter  Procedures  . Informed Consent Details: Transcribe to consent form and obtain patient signature  . Discharge instructions  . Follow-up  . Informed Consent Details: Transcribe to consent form and  obtain patient signature  . Provider attestation of informed consent for procedure/surgical case  . Verify informed consent  . Informed Consent Details: Transcribe to consent form and obtain patient signature  . Informed Consent Details: Transcribe to consent form and obtain patient signature   Imaging Ordered: Results for orders placed in visit on 11/10/16  DG C-Arm 1-60 Min-No Report   Narrative There is no Radiologist interpretation  for this exam.   New Prescriptions   No medications on file   Primary Care Physician: Rayetta Humphrey, MD Location: [facility] Note by: Sydnee Levans. Laban Emperor, M.D, DABA, DABAPM, DABPM, DABIPP, FIPP Date: 12/22/2016; Time: 9:56 AM  Disclaimer:  Medicine is not an exact science. The only guarantee in medicine is that nothing is guaranteed. It is important to note that the decision to proceed with this intervention was based on the information collected from the patient. The Data and conclusions were drawn from the patient's questionnaire, the interview, and the physical examination. Because the information was provided in large part by the patient, it cannot be guaranteed that it has not been purposely or unconsciously  manipulated. Every effort has been made to obtain as much relevant data as possible for this evaluation. It is important to note that the conclusions that lead to this procedure are derived in large part from the available data. Always take into account that the treatment will also be dependent on availability of resources and existing treatment guidelines, considered by other Pain Management Practitioners as being common knowledge and practice, at the time of the intervention. For Medico-Legal purposes, it is also important to point out that variation in procedural techniques and pharmacological choices are the acceptable norm. The indications, contraindications, technique, and results of the above procedure should only be interpreted and judged by a Board-Certified Interventional Pain Specialist with extensive familiarity and expertise in the same exact procedure and technique. Attempts at providing opinions without similar or greater experience and expertise than that of the treating physician will be considered as inappropriate and unethical, and shall result in a formal complaint to the state medical board and applicable specialty societies.  Instructions provided at this appointment: Patient Instructions  Pain Management Discharge Instructions  General Discharge Instructions :  If you need to reach your doctor call: Monday-Friday 8:00 am - 4:00 pm at [phone] or toll free [phone].  After clinic hours [phone] to have operator reach doctor.  Bring all of your medication bottles to all your appointments in the pain clinic.  To cancel or reschedule your appointment with Pain Management please remember to call 24 hours in advance to avoid a fee.  Refer to the educational materials which you have been given on: General Risks, I had my Procedure. Discharge Instructions, Post Sedation.  Post Procedure Instructions:  The drugs you were given will stay in your system until tomorrow, so  for the next 24 hours you should not drive, make any legal decisions or drink any alcoholic beverages.  You may eat anything you prefer, but it is better to start with liquids then soups and crackers, and gradually work up to solid foods.  Please notify your doctor immediately if you have any unusual bleeding, trouble breathing or pain that is not related to your normal pain.  Depending on the type of procedure that was done, some parts of your body may feel week and/or numb.  This usually clears up by tonight or the next day.  Walk with the use of  an assistive device or accompanied by an adult for the 24 hours.  You may use ice on the affected area for the first 24 hours.  Put ice in a Ziploc bag and cover with a towel and place against area 15 minutes on 15 minutes off.  You may switch to heat after 24 hours.Epidural Steroid Injection An epidural steroid injection is a shot of steroid medicine and numbing medicine that is given into the space between the spinal cord and the bones in your back (epidural space). The shot helps relieve pain caused by an irritated or swollen nerve root. The amount of pain relief you get from the injection depends on what is causing the nerve to be swollen and irritated, and how long your pain lasts. You are more likely to benefit from this injection if your pain is strong and comes on suddenly rather than if you have had pain for a long time. Tell a health care provider about:  Any allergies you have.  All medicines you are taking, including vitamins, herbs, eye drops, creams, and over-the-counter medicines.  Any problems you or family members have had with anesthetic medicines.  Any blood disorders you have.  Any surgeries you have had.  Any medical conditions you have.  Whether you are pregnant or may be pregnant. What are the risks? Generally, this is a safe procedure. However, problems may occur,  including:  Headache.  Bleeding.  Infection.  Allergic reaction to medicines.  Damage to your nerves. What happens before the procedure? Staying hydrated  Follow instructions from your health care provider about hydration, which may include:  Up to 2 hours before the procedure - you may continue to drink clear liquids, such as water, clear fruit juice, black coffee, and plain tea. Eating and drinking restrictions  Follow instructions from your health care provider about eating and drinking, which may include:  8 hours before the procedure - stop eating heavy meals or foods such as meat, fried foods, or fatty foods.  6 hours before the procedure - stop eating light meals or foods, such as toast or cereal.  6 hours before the procedure - stop drinking milk or drinks that contain milk.  2 hours before the procedure - stop drinking clear liquids. Medicine  You may be given medicines to lower anxiety.  Ask your health care provider about:  Changing or stopping your regular medicines. This is especially important if you are taking diabetes medicines or blood thinners.  Taking medicines such as aspirin and ibuprofen. These medicines can thin your blood. Do not take these medicines before your procedure if your health care provider instructs you not to. General instructions  Plan to have someone take you home from the hospital or clinic. What happens during the procedure?  You may receive a medicine to help you relax (sedative).  You will be asked to lie on your abdomen.  The injection site will be cleaned.  A numbing medicine (local anesthetic) will be used to numb the injection site.  A needle will be inserted through your skin into the epidural space. You may feel some discomfort when this happens. An X-ray machine will be used to make sure the needle is put as close as possible to the affected nerve.  A steroid medicine and a local anesthetic will be injected into the  epidural space.  The needle will be removed.  A bandage (dressing) will be put over the injection site. What happens after the procedure?  Your  blood pressure, heart rate, breathing rate, and blood oxygen level will be monitored until the medicines you were given have worn off.  Your arm or leg may feel weak or numb for a few hours.  The injection site may feel sore.  Do not drive for 24 hours if you received a sedative. This information is not intended to replace advice given to you by your health care provider. Make sure you discuss any questions you have with your health care provider. Document Released: 02/24/2008 Document Revised: 04/30/2016 Document Reviewed: 03/04/2016 Elsevier Interactive Patient Education  2017 [employer].

## 2016-12-23 ENCOUNTER — Telehealth: Payer: Self-pay | Admitting: *Deleted

## 2016-12-23 NOTE — Telephone Encounter (Signed)
Left voicemail for patient to call office if there are questions or concerns re; procedure on yesterday.

## 2016-12-24 LAB — TOXASSURE SELECT 13 (MW), URINE

## 2017-01-16 DIAGNOSIS — F3341 Major depressive disorder, recurrent, in partial remission: Secondary | ICD-10-CM | POA: Insufficient documentation

## 2017-01-20 ENCOUNTER — Ambulatory Visit: Payer: Medicare Other | Attending: Pain Medicine | Admitting: Pain Medicine

## 2017-01-20 ENCOUNTER — Encounter: Payer: Self-pay | Admitting: Pain Medicine

## 2017-01-20 VITALS — BP 147/59 | HR 75 | Temp 98.7°F | Resp 16 | Ht 63.0 in | Wt 230.0 lb

## 2017-01-20 DIAGNOSIS — M47892 Other spondylosis, cervical region: Secondary | ICD-10-CM | POA: Insufficient documentation

## 2017-01-20 DIAGNOSIS — M47812 Spondylosis without myelopathy or radiculopathy, cervical region: Secondary | ICD-10-CM | POA: Diagnosis not present

## 2017-01-20 DIAGNOSIS — M545 Low back pain: Secondary | ICD-10-CM | POA: Insufficient documentation

## 2017-01-20 DIAGNOSIS — M5481 Occipital neuralgia: Secondary | ICD-10-CM | POA: Diagnosis not present

## 2017-01-20 DIAGNOSIS — Z09 Encounter for follow-up examination after completed treatment for conditions other than malignant neoplasm: Secondary | ICD-10-CM | POA: Diagnosis present

## 2017-01-20 DIAGNOSIS — G4486 Cervicogenic headache: Secondary | ICD-10-CM

## 2017-01-20 DIAGNOSIS — Z794 Long term (current) use of insulin: Secondary | ICD-10-CM | POA: Insufficient documentation

## 2017-01-20 DIAGNOSIS — Z87891 Personal history of nicotine dependence: Secondary | ICD-10-CM | POA: Insufficient documentation

## 2017-01-20 DIAGNOSIS — R51 Headache: Secondary | ICD-10-CM | POA: Insufficient documentation

## 2017-01-20 DIAGNOSIS — E119 Type 2 diabetes mellitus without complications: Secondary | ICD-10-CM | POA: Insufficient documentation

## 2017-01-20 DIAGNOSIS — Z79899 Other long term (current) drug therapy: Secondary | ICD-10-CM | POA: Insufficient documentation

## 2017-01-20 NOTE — Patient Instructions (Signed)
GENERAL RISKS AND COMPLICATIONS  What are the risk, side effects and possible complications? Generally speaking, most procedures are safe.  However, with any procedure there are risks, side effects, and the possibility of complications.  The risks and complications are dependent upon the sites that are lesioned, or the type of nerve block to be performed.  The closer the procedure is to the spine, the more serious the risks are.  Great care is taken when placing the radio frequency needles, block needles or lesioning probes, but sometimes complications can occur. 1. Infection: Any time there is an injection through the skin, there is a risk of infection.  This is why sterile conditions are used for these blocks.  There are four possible types of infection. 1. Localized skin infection. 2. Central Nervous System Infection-This can be in the form of Meningitis, which can be deadly. 3. Epidural Infections-This can be in the form of an epidural abscess, which can cause pressure inside of the spine, causing compression of the spinal cord with subsequent paralysis. This would require an emergency surgery to decompress, and there are no guarantees that the patient would recover from the paralysis. 4. Discitis-This is an infection of the intervertebral discs.  It occurs in about 1% of discography procedures.  It is difficult to treat and it may lead to surgery.        2. Pain: the needles have to go through skin and soft tissues, will cause soreness.       3. Damage to internal structures:  The nerves to be lesioned may be near blood vessels or    other nerves which can be potentially damaged.       4. Bleeding: Bleeding is more common if the patient is taking blood thinners such as  aspirin, Coumadin, Ticiid, Plavix, etc., or if he/she have some genetic predisposition  such as hemophilia. Bleeding into the spinal canal can cause compression of the spinal  cord with subsequent paralysis.  This would require an  emergency surgery to  decompress and there are no guarantees that the patient would recover from the  paralysis.       5. Pneumothorax:  Puncturing of a lung is a possibility, every time a needle is introduced in  the area of the chest or upper back.  Pneumothorax refers to free air around the  collapsed lung(s), inside of the thoracic cavity (chest cavity).  Another two possible  complications related to a similar event would include: Hemothorax and Chylothorax.   These are variations of the Pneumothorax, where instead of air around the collapsed  lung(s), you may have blood or chyle, respectively.       6. Spinal headaches: They may occur with any procedures in the area of the spine.       7. Persistent CSF (Cerebro-Spinal Fluid) leakage: This is a rare problem, but may occur  with prolonged intrathecal or epidural catheters either due to the formation of a fistulous  track or a dural tear.       8. Nerve damage: By working so close to the spinal cord, there is always a possibility of  nerve damage, which could be as serious as a permanent spinal cord injury with  paralysis.       9. Death:  Although rare, severe deadly allergic reactions known as "Anaphylactic  reaction" can occur to any of the medications used.      10. Worsening of the symptoms:  We can always make thing worse.    What are the chances of something like this happening? Chances of any of this occuring are extremely low.  By statistics, you have more of a chance of getting killed in a motor vehicle accident: while driving to the hospital than any of the above occurring .  Nevertheless, you should be aware that they are possibilities.  In general, it is similar to taking a shower.  Everybody knows that you can slip, hit your head and get killed.  Does that mean that you should not shower again?  Nevertheless always keep in mind that statistics do not mean anything if you happen to be on the wrong side of them.  Even if a procedure has a 1  (one) in a 1,000,000 (million) chance of going wrong, it you happen to be that one..Also, keep in mind that by statistics, you have more of a chance of having something go wrong when taking medications.  Who should not have this procedure? If you are on a blood thinning medication (e.g. Coumadin, Plavix, see list of "Blood Thinners"), or if you have an active infection going on, you should not have the procedure.  If you are taking any blood thinners, please inform your physician.  How should I prepare for this procedure?  Do not eat or drink anything at least six hours prior to the procedure.  Bring a driver with you .  It cannot be a taxi.  Come accompanied by an adult that can drive you back, and that is strong enough to help you if your legs get weak or numb from the local anesthetic.  Take all of your medicines the morning of the procedure with just enough water to swallow them.  If you have diabetes, make sure that you are scheduled to have your procedure done first thing in the morning, whenever possible.  If you have diabetes, take only half of your insulin dose and notify our nurse that you have done so as soon as you arrive at the clinic.  If you are diabetic, but only take blood sugar pills (oral hypoglycemic), then do not take them on the morning of your procedure.  You may take them after you have had the procedure.  Do not take aspirin or any aspirin-containing medications, at least eleven (11) days prior to the procedure.  They may prolong bleeding.  Wear loose fitting clothing that may be easy to take off and that you would not mind if it got stained with Betadine or blood.  Do not wear any jewelry or perfume  Remove any nail coloring.  It will interfere with some of our monitoring equipment.  NOTE: Remember that this is not meant to be interpreted as a complete list of all possible complications.  Unforeseen problems may occur.  BLOOD THINNERS The following drugs  contain aspirin or other products, which can cause increased bleeding during surgery and should not be taken for 2 weeks prior to and 1 week after surgery.  If you should need take something for relief of minor pain, you may take acetaminophen which is found in Tylenol,m Datril, Anacin-3 and Panadol. It is not blood thinner. The products listed below are.  Do not take any of the products listed below in addition to any listed on your instruction sheet.  A.P.C or A.P.C with Codeine Codeine Phosphate Capsules #3 Ibuprofen Ridaura  ABC compound Congesprin Imuran rimadil  Advil Cope Indocin Robaxisal  Alka-Seltzer Effervescent Pain Reliever and Antacid Coricidin or Coricidin-D  Indomethacin Rufen    Alka-Seltzer plus Cold Medicine Cosprin Ketoprofen S-A-C Tablets  Anacin Analgesic Tablets or Capsules Coumadin Korlgesic Salflex  Anacin Extra Strength Analgesic tablets or capsules CP-2 Tablets Lanoril Salicylate  Anaprox Cuprimine Capsules Levenox Salocol  Anexsia-D Dalteparin Magan Salsalate  Anodynos Darvon compound Magnesium Salicylate Sine-off  Ansaid Dasin Capsules Magsal Sodium Salicylate  Anturane Depen Capsules Marnal Soma  APF Arthritis pain formula Dewitt's Pills Measurin Stanback  Argesic Dia-Gesic Meclofenamic Sulfinpyrazone  Arthritis Bayer Timed Release Aspirin Diclofenac Meclomen Sulindac  Arthritis pain formula Anacin Dicumarol Medipren Supac  Analgesic (Safety coated) Arthralgen Diffunasal Mefanamic Suprofen  Arthritis Strength Bufferin Dihydrocodeine Mepro Compound Suprol  Arthropan liquid Dopirydamole Methcarbomol with Aspirin Synalgos  ASA tablets/Enseals Disalcid Micrainin Tagament  Ascriptin Doan's Midol Talwin  Ascriptin A/D Dolene Mobidin Tanderil  Ascriptin Extra Strength Dolobid Moblgesic Ticlid  Ascriptin with Codeine Doloprin or Doloprin with Codeine Momentum Tolectin  Asperbuf Duoprin Mono-gesic Trendar  Aspergum Duradyne Motrin or Motrin IB Triminicin  Aspirin  plain, buffered or enteric coated Durasal Myochrisine Trigesic  Aspirin Suppositories Easprin Nalfon Trillsate  Aspirin with Codeine Ecotrin Regular or Extra Strength Naprosyn Uracel  Atromid-S Efficin Naproxen Ursinus  Auranofin Capsules Elmiron Neocylate Vanquish  Axotal Emagrin Norgesic Verin  Azathioprine Empirin or Empirin with Codeine Normiflo Vitamin E  Azolid Emprazil Nuprin Voltaren  Bayer Aspirin plain, buffered or children's or timed BC Tablets or powders Encaprin Orgaran Warfarin Sodium  Buff-a-Comp Enoxaparin Orudis Zorpin  Buff-a-Comp with Codeine Equegesic Os-Cal-Gesic   Buffaprin Excedrin plain, buffered or Extra Strength Oxalid   Bufferin Arthritis Strength Feldene Oxphenbutazone   Bufferin plain or Extra Strength Feldene Capsules Oxycodone with Aspirin   Bufferin with Codeine Fenoprofen Fenoprofen Pabalate or Pabalate-SF   Buffets II Flogesic Panagesic   Buffinol plain or Extra Strength Florinal or Florinal with Codeine Panwarfarin   Buf-Tabs Flurbiprofen Penicillamine   Butalbital Compound Four-way cold tablets Penicillin   Butazolidin Fragmin Pepto-Bismol   Carbenicillin Geminisyn Percodan   Carna Arthritis Reliever Geopen Persantine   Carprofen Gold's salt Persistin   Chloramphenicol Goody's Phenylbutazone   Chloromycetin Haltrain Piroxlcam   Clmetidine heparin Plaquenil   Cllnoril Hyco-pap Ponstel   Clofibrate Hydroxy chloroquine Propoxyphen         Before stopping any of these medications, be sure to consult the physician who ordered them.  Some, such as Coumadin (Warfarin) are ordered to prevent or treat serious conditions such as "deep thrombosis", "pumonary embolisms", and other heart problems.  The amount of time that you may need off of the medication may also vary with the medication and the reason for which you were taking it.  If you are taking any of these medications, please make sure you notify your pain physician before you undergo any  procedures.         Occipital Nerve Block Patient Information  Description: The occipital nerves originate in the cervical (neck) spinal cord and travel upward through muscle and tissue to supply sensation to the back of the head and top of the scalp.  In addition, the nerves control some of the muscles of the scalp.  Occipital neuralgia is an irritation of these nerves which can cause headaches, numbness of the scalp, and neck discomfort.     The occipital nerve block will interrupt nerve transmission through these nerves and can relieve pain and spasm.  The block consists of insertion of a small needle under the skin in the back of the head to deposit local anesthetic (numbing medicine) and/or steroids around the nerve.  The   entire block usually lasts less than 5 minutes.  Conditions which may be treated by occipital blocks:   Muscular pain and spasm of the scalp  Nerve irritation, back of the head  Headaches  Upper neck pain  Preparation for the injection:  12. Do not eat any solid food or dairy products within 8 hours of your appointment. 13. You may drink clear liquids up to 3 hours before appointment.  Clear liquids include water, black coffee, juice or soda.  No milk or cream please. 14. You may take your regular medication, including pain medications, with a sip of water before you appointment.  Diabetics should hold regular insulin (if taken separately) and take 1/2 normal NPH dose the morning of the procedure.  Carry some sugar containing items with you to your appointment. 15. A driver must accompany you and be prepared to drive you home after your procedure. 16. Bring all your current medications with you. 17. An IV may be inserted and sedation may be given at the discretion of the physician. 18. A blood pressure cuff, EKG, and other monitors will often be applied during the procedure.  Some patients may need to have extra oxygen administered for a short period. 19. You  will be asked to provide medical information, including your allergies and medications, prior to the procedure.  We must know immediately if you are taking blood thinners (like Coumadin/Warfarin) or if you are allergic to IV iodine contrast (dye).  We must know if you could possible be pregnant.  20. Do not wear a high collared shirt or turtleneck.  Tie long hair up in the back if possible.  Possible side-effects:   Bleeding from needle site  Infection (rare, may require surgery)  Nerve injury (rare)  Hair on back of neck can be tinged with iodine scrub (this will wash out)  Light-headedness (temporary)  Pain at injection site (several days)  Decreased blood pressure (rare, temporary)  Seizure (very rare)  Call if you experience:   Hives or difficulty breathing ( go to the emergency room)  Inflammation or drainage at the injection site(s)  Please note:  Although the local anesthetic injected can often make your painful muscles or headache feel good for several hours after the injection, the pain may return.  It takes 3-7 days for steroids to work.  You may not notice any pain relief for at least one week.  If effective, we will often do a series of injections spaced 3-6 weeks apart to maximally decrease your pain.  If you have any questions, please call [phone] [facility]  

## 2017-01-20 NOTE — Progress Notes (Signed)
Patient's Name: Amber Acosta  MRN: [id number]  Referring Provider: Rayetta Humphrey, MD  DOB: 1951-01-04  PCP: Rayetta Humphrey, MD  DOS: 01/20/2017  Note by: Sydnee Levans. Laban Emperor, MD  Service setting: Ambulatory outpatient  Specialty: Interventional Pain Management  Location: [facility] (AMB) Pain Management Facility    Patient type: Established   Primary Reason(s) for Visit: Encounter for post-procedure evaluation of chronic illness with mild to moderate exacerbation CC: Back Pain (upper and lower) and Headache (patient states cervicogenesis headache)  HPI  Amber Acosta is a 66 y.o. year old, female patient, who comes today for a post-procedure evaluation. She has Cervical vertebral fusion (Uncomplicated C5 through C7 ACDF.); Fibromyalgia; Lumbar facet joint pain; Lumbar facet syndrome (Bilateral) (R>L); Chronic kidney disease; COPD (chronic obstructive pulmonary disease) (HCC); Depression; Type 2 diabetes mellitus (HCC); Hypertensive heart disease with congestive heart failure (HCC); Gall bladder polyp; GERD (gastroesophageal reflux disease); Hyperlipemia; Inflammatory polyarthropathy (HCC); Sleep terror; Obesity, Class II, BMI 35-39.9, with comorbidity; OP (osteoporosis); Psoriasis; Kidney cysts; Muscle weakness (generalized); Sarcoidosis (HCC); Thrombocytopenia (HCC); Vitamin D deficiency; Chronic neck pain (Location of Secondary source of pain) (Bilateral) (L>R); Cervicogenic headache (Left); Cervical facet syndrome (Left); Chronic shoulder pain (Location of Tertiary source of pain) (Bilateral) (L>R); At risk for falls; Balance problems; History of closed head injury; Opiate use (22.5 MME/Day); Long term current use of opiate analgesic; Encounter for therapeutic drug level monitoring; Long term prescription opiate use; Spondylolisthesis of cervical region; 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HCC); Hx of cervical spine surgery; Low magnesium levels; Myofascial pain; Neuropathy (HCC); Benign paroxysmal positional vertigo;  Diabetic peripheral neuropathy (HCC); Meralgia paresthetica (Right); Cervical spondylosis; Chronic diastolic congestive heart failure (HCC); Mixed stress and urge urinary incontinence; Overactive detrusor; 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 Her primarily concern today is the Back Pain (upper and lower) and Headache (patient states cervicogenesis headache)  Pain Assessment: Self-Reported Pain Score: 1 /10             Reported level is compatible with observation.       Pain Type: Chronic pain Pain Location: Back (headaches) Pain Orientation: Mid, Lower (left sided headache) Pain Descriptors / Indicators: Burning, Constant, Stabbing Pain Frequency: Constant  Amber Acosta comes in today for post-procedure evaluation after the treatment done on 12/22/2016.  Further details on both, my assessment(s), as well as the proposed treatment plan, please see below.  Post-Procedure Assessment  12/22/2016 Procedure: Left cervical epidural steroid injection under fluoroscopic guidance and IV sedation. Post-procedure pain score: 0/10 (100% relief) Influential Factors: BMI: 40.74 kg/m Intra-procedural challenges: None observed Assessment challenges: None detected         Post-procedural side-effects, adverse reactions, or complications: None reported Reported issues: None  Sedation: Sedation provided. When no sedatives are used, the analgesic levels obtained are directly associated to the effectiveness of the local anesthetics. However, when sedation is provided, the level of analgesia obtained during the initial 1 hour following the intervention, is believed to be the result of a combination of factors. These factors may include, but are not limited to: 1. The effectiveness of the local anesthetics  used. 2. The effects of the analgesic(s) and/or anxiolytic(s) used. 3. The degree of discomfort experienced by the patient at the time of the procedure. 4. The patients ability and reliability in recalling and recording the events. 5.  The presence and influence of possible secondary gains and/or psychosocial factors. Reported result: Relief experienced during the 1st hour after the procedure: 100 % (Ultra-Short Term Relief) Interpretative annotation: Analgesia during this period is likely to be Local Anesthetic and/or IV Sedative (Analgesic/Anxiolitic) related.          Effects of local anesthetic: The analgesic effects attained during this period are directly associated to the localized infiltration of local anesthetics and therefore cary significant diagnostic value as to the etiological location, or anatomical origin, of the pain. Expected duration of relief is directly dependent on the pharmacodynamics of the local anesthetic used. Long-acting (4-6 hours) anesthetics used.  Reported result: Relief during the next 4 to 6 hour after the procedure: 100 % (Short-Term Relief) Interpretative annotation: Complete relief would suggest area to be the source of the pain.          Long-term benefit: Defined as the period of time past the expected duration of local anesthetics. With the possible exception of prolonged sympathetic blockade from the local anesthetics, benefits during this period are typically attributed to, or associated with, other factors such as analgesic sensory neuropraxia, antiinflammatory effects, or beneficial biochemical changes provided by agents other than the local anesthetics Reported result: Extended relief following procedure: 90 % (relief was great for approx 2.5 weeks and then just a twinge of pain and then became better again.) (Long-Term Relief) Interpretative annotation: Good relief. This could suggest inflammation to be a significant component in the etiology to the pain.           Current benefits: Defined as persistent relief that continues at this point in time.   Reported results: Treated area: 75 % Amber Acosta reports improvement in function Interpretative annotation: Ongoing benefits would suggest effective therapeutic approach  Interpretation: Results would suggest a successful diagnostic intervention.          Laboratory Chemistry  Inflammation Markers Lab Results  Component Value Date   ESRSEDRATE 52 (H) 03/26/2016   CRP 2.1 (H) 03/26/2016   Renal Function Lab Results  Component Value Date   BUN 14 03/26/2016   CREATININE 1.28 (H) 03/26/2016   GFRAA 50 (L) 03/26/2016   GFRNONAA 44 (L) 03/26/2016   Hepatic Function Lab Results  Component Value Date   AST 24 03/26/2016   ALT 20 03/26/2016   ALBUMIN 4.0 03/26/2016   Electrolytes Lab Results  Component Value Date   NA 136 03/26/2016   K 3.8 03/26/2016   CL 100 (L) 03/26/2016   CALCIUM 8.5 (L) 03/26/2016   MG 1.8 03/26/2016   Pain Modulating Vitamins Lab Results  Component Value Date   VD25OH 20.2 (L) 03/26/2016   VITAMINB12 467 03/26/2016   Coagulation Parameters Lab Results  Component Value Date   PLT 146 (L) 03/26/2016   Cardiovascular No results found for: BNP, HGB, HCT Note: Lab results reviewed.  Recent Diagnostic Imaging Review  Dg C-arm 1-60 Min-no Report  Result Date: 12/22/2016 There is no Radiologist interpretation  for this exam.  Note: Imaging results reviewed.          Meds  The patient has a current medication list which includes the following prescription(s): albuterol, amlodipine, b-d ins syr ultrafine 1cc/30g, budesonide-formoterol, fluoxetine, furosemide, hydralazine, insulin nph human, klor-con 10, losartan, lovastatin, magnesium oxide, metoprolol succinate, nitroglycerin, oxycodone, oxycodone, oxycodone, pregabalin, tizanidine, trazodone, and vitamin d (ergocalciferol).  Current Outpatient Prescriptions on File Prior to Visit  Medication Sig  .  albuterol (PROVENTIL HFA;VENTOLIN HFA) 108 (90 Base) MCG/ACT  inhaler Inhale 2 puffs into the lungs every 6 (six) hours as needed.   Marland Kitchen amLODipine (NORVASC) 10 MG tablet Take 10 mg by mouth daily.   . B-D INS SYR ULTRAFINE 1CC/30G 30G X 1/2" 1 ML MISC   . budesonide-formoterol (SYMBICORT) 160-4.5 MCG/ACT inhaler Inhale 2 puffs into the lungs 2 (two) times daily.   Marland Kitchen FLUoxetine (PROZAC) 40 MG capsule Take 40 mg by mouth daily.   . furosemide (LASIX) 40 MG tablet Take 40 mg by mouth daily.  . hydrALAZINE (APRESOLINE) 25 MG tablet Take 25 mg by mouth 2 (two) times daily.   . insulin NPH Human (HUMULIN N,NOVOLIN N) 100 UNIT/ML injection NPH inject SQ bid 60 units  . KLOR-CON 10 10 MEQ tablet Take 10 mEq by mouth daily.   Marland Kitchen losartan (COZAAR) 100 MG tablet TAKE 1 TABLET (100 MG TOTAL) BY MOUTH ONCE DAILY.  Marland Kitchen lovastatin (MEVACOR) 10 MG tablet Take 10 mg by mouth at bedtime.  . magnesium oxide (MAG-OX) 400 (241.3 Mg) MG tablet TAKE 1 TABLET (400 MG TOTAL) BY MOUTH DAILY.  . metoprolol succinate (TOPROL-XL) 50 MG 24 hr tablet Take 25 mg by mouth daily.   . nitroGLYCERIN (NITROSTAT) 0.3 MG SL tablet 1 tablet sublingual prn for ESOPHAGEAL SPASMS(may repeat every 5 min, seek med help if pain persists after 3 tablets)  . oxyCODONE (OXY IR/ROXICODONE) 5 MG immediate release tablet Take 1 tablet (5 mg total) by mouth every 8 (eight) hours as needed for moderate pain or severe pain.  Melene Muller ON 01/25/2017] oxyCODONE (OXY IR/ROXICODONE) 5 MG immediate release tablet Take 1 tablet (5 mg total) by mouth every 8 (eight) hours as needed for moderate pain or severe pain.  Melene Muller ON 02/24/2017] oxyCODONE (OXY IR/ROXICODONE) 5 MG immediate release tablet Take 1 tablet (5 mg total) by mouth every 8 (eight) hours as needed for moderate pain or severe pain.  . pregabalin (LYRICA) 150 MG capsule Take 1 capsule (150 mg total) by mouth 2 (two) times daily.  Marland Kitchen tiZANidine (ZANAFLEX) 4 MG tablet Take 1 tablet (4 mg total) by mouth  2 (two) times daily as needed for muscle spasms.  . traZODone (DESYREL) 100 MG tablet Take 200 mg by mouth at bedtime.   . Vitamin D, Ergocalciferol, (DRISDOL) 50000 units CAPS capsule Take 50,000 Units by mouth every 30 (thirty) days.    No current facility-administered medications on file prior to visit.    ROS  Constitutional: Denies any fever or chills Gastrointestinal: No reported hemesis, hematochezia, vomiting, or acute GI distress Musculoskeletal: Denies any acute onset joint swelling, redness, loss of ROM, or weakness Neurological: No reported episodes of acute onset apraxia, aphasia, dysarthria, agnosia, amnesia, paralysis, loss of coordination, or loss of consciousness  Allergies  Amber Acosta is allergic to enbrel [etanercept]; metformin; methotrexate; mushroom extract complex; other; darvon [propoxyphene]; minocycline; and ciprofloxacin.  PFSH  Drug: Amber Acosta  reports that she does not use drugs. Alcohol:  reports that she does not drink alcohol. Tobacco:  reports that she has quit smoking. She has quit using smokeless tobacco. Medical:  has a past medical history of Arthritis; Cervical spondylosis without myelopathy (09/21/2015); Chronic pain; Chronic pain associated with significant psychosocial dysfunction (08/28/2013); Depression; Diabetes mellitus without complication (HCC); Displacement of cervical intervertebral disc without myelopathy (09/05/2015); Fibromyalgia; GERD (gastroesophageal reflux disease); Hypertension; Kidney disease, chronic, stage III (GFR 30-59 ml/min); Sarcoid (HCC); and Thrombopenia (HCC). Family: family history includes Alcohol abuse in her brother; Alzheimer's disease in her mother; COPD  in her father; Diabetes in her father; Heart disease in her father.  Past Surgical History:  Procedure Laterality Date  . ABDOMINAL HYSTERECTOMY    . BACK SURGERY    . BREAST SURGERY    . FRACTURE SURGERY     nose  . HAND SURGERY    . KNEE ARTHROSCOPY     right  .  neck fusion     Constitutional Exam  General appearance: Well nourished, well developed, and well hydrated. In no apparent acute distress Vitals:   01/20/17 0815  BP: (!) 147/59  Pulse: 75  Resp: 16  Temp: 98.7 F (37.1 C)  TempSrc: Oral  SpO2: 98%  Weight: 230 lb (104.3 kg)  Height: 5\' 3"  (1.6 m)   BMI Assessment: Estimated body mass index is 40.74 kg/m as calculated from the following:   Height as of this encounter: 5\' 3"  (1.6 m).   Weight as of this encounter: 230 lb (104.3 kg).  BMI interpretation table: BMI level Category Range association with higher incidence of chronic pain  <18 kg/m2 Underweight   18.5-24.9 kg/m2 Ideal body weight   25-29.9 kg/m2 Overweight Increased incidence by 20%  30-34.9 kg/m2 Obese (Class I) Increased incidence by 68%  35-39.9 kg/m2 Severe obesity (Class II) Increased incidence by 136%  >40 kg/m2 Extreme obesity (Class III) Increased incidence by 254%   BMI Readings from Last 4 Encounters:  01/20/17 40.74 kg/m  12/22/16 40.74 kg/m  12/16/16 40.74 kg/m  11/10/16 39.86 kg/m   Wt Readings from Last 4 Encounters:  01/20/17 230 lb (104.3 kg)  12/22/16 230 lb (104.3 kg)  12/16/16 230 lb (104.3 kg)  11/10/16 225 lb (102.1 kg)  Psych/Mental status: Alert, oriented x 3 (person, place, & time)       Eyes: PERLA Respiratory: No evidence of acute respiratory distress  Cervical Spine Exam  Inspection: No masses, redness, or swelling Alignment: Symmetrical Functional ROM: Decreased ROM Stability: No instability detected Muscle strength & Tone: Functionally intact Sensory: Unimpaired Palpation: Complains of area being tender to palpation over the area of the left greater occipital nerve.  Upper Extremity (UE) Exam    Side: Right upper extremity  Side: Left upper extremity  Inspection: No masses, redness, swelling, or asymmetry. No contractures  Inspection: No masses, redness, swelling, or asymmetry. No contractures  Functional ROM:  Unrestricted ROM          Functional ROM: Unrestricted ROM          Muscle strength & Tone: Functionally intact  Muscle strength & Tone: Functionally intact  Sensory: Unimpaired  Sensory: Unimpaired  Palpation: Euthermic  Palpation: Euthermic  Specialized Test(s): Deferred         Specialized Test(s): Deferred          Thoracic Spine Exam  Inspection: No masses, redness, or swelling Alignment: Symmetrical Functional ROM: Unrestricted ROM Stability: No instability detected Sensory: Unimpaired Muscle strength & Tone: Functionally intact Palpation: Non-contributory  Lumbar Spine Exam  Inspection: No masses, redness, or swelling Alignment: Symmetrical Functional ROM: Unrestricted ROM Stability: No instability detected Muscle strength & Tone: Functionally intact Sensory: Unimpaired Palpation: Non-contributory Provocative Tests: Lumbar Hyperextension and rotation test: evaluation deferred today       Patrick's Maneuver: evaluation deferred today              Gait & Posture Assessment  Ambulation: Unassisted Gait: Relatively normal for age and body habitus Posture: WNL   Lower Extremity Exam    Side: Right lower extremity  Side:  Left lower extremity  Inspection: No masses, redness, swelling, or asymmetry. No contractures  Inspection: No masses, redness, swelling, or asymmetry. No contractures  Functional ROM: Unrestricted ROM          Functional ROM: Unrestricted ROM          Muscle strength & Tone: Functionally intact  Muscle strength & Tone: Functionally intact  Sensory: Unimpaired  Sensory: Unimpaired  Palpation: No palpable anomalies  Palpation: No palpable anomalies   Assessment  Primary Diagnosis & Pertinent Problem List: The primary encounter diagnosis was Occipital neuralgia (Left). Diagnoses of Cervicogenic headache (Left) and Other osteoarthritis of spine, cervical region were also pertinent to this visit.  Status Diagnosis  Controlled Controlled Controlled 1.  Occipital neuralgia (Left)   2. Cervicogenic headache (Left)   3. Other osteoarthritis of spine, cervical region      Plan of Care  Pharmacotherapy (Medications Ordered): No orders of the defined types were placed in this encounter.  New Prescriptions   No medications on file   Medications administered today: Amber Acosta had no medications administered during this visit. Lab-work, procedure(s), and/or referral(s): Orders Placed This Encounter  Procedures  . GREATER OCCIPITAL NERVE BLOCK   Imaging and/or referral(s): None  Interventional therapies: Planned, scheduled, and/or pending:   Diagnostic left greater occipital nerve block under fluoroscopic guidance    Considering:   Palliative left sided cervical facet block #3 Diagnostic/palliative bilateral lumbar facet block Possible bilateral lumbar facet radiofrequency ablation Diagnostic/palliative left cervical facet block #2 Possible left-sided cervical facet radiofrequency ablation   Palliative PRN treatment(s):   Diagnostic/palliative bilateral lumbar facet block Diagnostic/palliative left cervical facet block #2 Palliative left sided cervical facet block #3   Provider-requested follow-up: Return in 3 months (on 04/09/2017) for (Nurse Practitioner) Med-Mgmt, in addition, procedure (ASAA).  Future Appointments Date Time Provider Department Center  01/26/2017 9:00 AM Delano Metz, MD [facility] None   Primary Care Physician: Rayetta Humphrey, MD Location: [facility] Outpatient Pain Management Facility Note by: Sydnee Levans. Laban Emperor, M.D, DABA, DABAPM, DABPM, DABIPP, FIPP Date: 01/20/2017; Time: 9:04 AM  Pain Score Disclaimer: We use the NRS-11 scale. This is a self-reported, subjective measurement of pain severity with only modest accuracy. It is used primarily to identify changes within a particular patient. It must be understood that outpatient pain scales are significantly less accurate that those used for research, where  they can be applied under ideal controlled circumstances with minimal exposure to variables. In reality, the score is likely to be a combination of pain intensity and pain affect, where pain affect describes the degree of emotional arousal or changes in action readiness caused by the sensory experience of pain. Factors such as social and work situation, setting, emotional state, anxiety levels, expectation, and prior pain experience may influence pain perception and show large inter-individual differences that may also be affected by time variables.  Patient instructions provided during this appointment: Patient Instructions   GENERAL RISKS AND COMPLICATIONS  What are the risk, side effects and possible complications? Generally speaking, most procedures are safe.  However, with any procedure there are risks, side effects, and the possibility of complications.  The risks and complications are dependent upon the sites that are lesioned, or the type of nerve block to be performed.  The closer the procedure is to the spine, the more serious the risks are.  Great care is taken when placing the radio frequency needles, block needles or lesioning probes, but sometimes complications can occur. 1. Infection: Any time there  is an injection through the skin, there is a risk of infection.  This is why sterile conditions are used for these blocks.  There are four possible types of infection. 1. Localized skin infection. 2. Central Nervous System Infection-This can be in the form of Meningitis, which can be deadly. 3. Epidural Infections-This can be in the form of an epidural abscess, which can cause pressure inside of the spine, causing compression of the spinal cord with subsequent paralysis. This would require an emergency surgery to decompress, and there are no guarantees that the patient would recover from the paralysis. 4. Discitis-This is an infection of the intervertebral discs.  It occurs in about 1% of  discography procedures.  It is difficult to treat and it may lead to surgery.        2. Pain: the needles have to go through skin and soft tissues, will cause soreness.       3. Damage to internal structures:  The nerves to be lesioned may be near blood vessels or    other nerves which can be potentially damaged.       4. Bleeding: Bleeding is more common if the patient is taking blood thinners such as  aspirin, Coumadin, Ticiid, Plavix, etc., or if he/she have some genetic predisposition  such as hemophilia. Bleeding into the spinal canal can cause compression of the spinal  cord with subsequent paralysis.  This would require an emergency surgery to  decompress and there are no guarantees that the patient would recover from the  paralysis.       5. Pneumothorax:  Puncturing of a lung is a possibility, every time a needle is introduced in  the area of the chest or upper back.  Pneumothorax refers to free air around the  collapsed lung(s), inside of the thoracic cavity (chest cavity).  Another two possible  complications related to a similar event would include: Hemothorax and Chylothorax.   These are variations of the Pneumothorax, where instead of air around the collapsed  lung(s), you may have blood or chyle, respectively.       6. Spinal headaches: They may occur with any procedures in the area of the spine.       7. Persistent CSF (Cerebro-Spinal Fluid) leakage: This is a rare problem, but may occur  with prolonged intrathecal or epidural catheters either due to the formation of a fistulous  track or a dural tear.       8. Nerve damage: By working so close to the spinal cord, there is always a possibility of  nerve damage, which could be as serious as a permanent spinal cord injury with  paralysis.       9. Death:  Although rare, severe deadly allergic reactions known as "Anaphylactic  reaction" can occur to any of the medications used.      10. Worsening of the symptoms:  We can always make thing  worse.  What are the chances of something like this happening? Chances of any of this occuring are extremely low.  By statistics, you have more of a chance of getting killed in a motor vehicle accident: while driving to the hospital than any of the above occurring .  Nevertheless, you should be aware that they are possibilities.  In general, it is similar to taking a shower.  Everybody knows that you can slip, hit your head and get killed.  Does that mean that you should not shower again?  Nevertheless always keep in mind  that statistics do not mean anything if you happen to be on the wrong side of them.  Even if a procedure has a 1 (one) in a 1,000,000 (million) chance of going wrong, it you happen to be that one..Also, keep in mind that by statistics, you have more of a chance of having something go wrong when taking medications.  Who should not have this procedure? If you are on a blood thinning medication (e.g. Coumadin, Plavix, see list of "Blood Thinners"), or if you have an active infection going on, you should not have the procedure.  If you are taking any blood thinners, please inform your physician.  How should I prepare for this procedure?  Do not eat or drink anything at least six hours prior to the procedure.  Bring a driver with you .  It cannot be a taxi.  Come accompanied by an adult that can drive you back, and that is strong enough to help you if your legs get weak or numb from the local anesthetic.  Take all of your medicines the morning of the procedure with just enough water to swallow them.  If you have diabetes, make sure that you are scheduled to have your procedure done first thing in the morning, whenever possible.  If you have diabetes, take only half of your insulin dose and notify our nurse that you have done so as soon as you arrive at the clinic.  If you are diabetic, but only take blood sugar pills (oral hypoglycemic), then do not take them on the morning of your  procedure.  You may take them after you have had the procedure.  Do not take aspirin or any aspirin-containing medications, at least eleven (11) days prior to the procedure.  They may prolong bleeding.  Wear loose fitting clothing that may be easy to take off and that you would not mind if it got stained with Betadine or blood.  Do not wear any jewelry or perfume  Remove any nail coloring.  It will interfere with some of our monitoring equipment.  NOTE: Remember that this is not meant to be interpreted as a complete list of all possible complications.  Unforeseen problems may occur.  BLOOD THINNERS The following drugs contain aspirin or other products, which can cause increased bleeding during surgery and should not be taken for 2 weeks prior to and 1 week after surgery.  If you should need take something for relief of minor pain, you may take acetaminophen which is found in Tylenol,m Datril, Anacin-3 and Panadol. It is not blood thinner. The products listed below are.  Do not take any of the products listed below in addition to any listed on your instruction sheet.  A.P.C or A.P.C with Codeine Codeine Phosphate Capsules #3 Ibuprofen Ridaura  ABC compound Congesprin Imuran rimadil  Advil Cope Indocin Robaxisal  Alka-Seltzer Effervescent Pain Reliever and Antacid Coricidin or Coricidin-D  Indomethacin Rufen  Alka-Seltzer plus Cold Medicine Cosprin Ketoprofen S-A-C Tablets  Anacin Analgesic Tablets or Capsules Coumadin Korlgesic Salflex  Anacin Extra Strength Analgesic tablets or capsules CP-2 Tablets Lanoril Salicylate  Anaprox Cuprimine Capsules Levenox Salocol  Anexsia-D Dalteparin Magan Salsalate  Anodynos Darvon compound Magnesium Salicylate Sine-off  Ansaid Dasin Capsules Magsal Sodium Salicylate  Anturane Depen Capsules Marnal Soma  APF Arthritis pain formula Dewitt's Pills Measurin Stanback  Argesic Dia-Gesic Meclofenamic Sulfinpyrazone  Arthritis Bayer Timed Release Aspirin  Diclofenac Meclomen Sulindac  Arthritis pain formula Anacin Dicumarol Medipren Supac  Analgesic (Safety coated) Arthralgen  Diffunasal Mefanamic Suprofen  Arthritis Strength Bufferin Dihydrocodeine Mepro Compound Suprol  Arthropan liquid Dopirydamole Methcarbomol with Aspirin Synalgos  ASA tablets/Enseals Disalcid Micrainin Tagament  Ascriptin Doan's Midol Talwin  Ascriptin A/D Dolene Mobidin Tanderil  Ascriptin Extra Strength Dolobid Moblgesic Ticlid  Ascriptin with Codeine Doloprin or Doloprin with Codeine Momentum Tolectin  Asperbuf Duoprin Mono-gesic Trendar  Aspergum Duradyne Motrin or Motrin IB Triminicin  Aspirin plain, buffered or enteric coated Durasal Myochrisine Trigesic  Aspirin Suppositories Easprin Nalfon Trillsate  Aspirin with Codeine Ecotrin Regular or Extra Strength Naprosyn Uracel  Atromid-S Efficin Naproxen Ursinus  Auranofin Capsules Elmiron Neocylate Vanquish  Axotal Emagrin Norgesic Verin  Azathioprine Empirin or Empirin with Codeine Normiflo Vitamin E  Azolid Emprazil Nuprin Voltaren  Bayer Aspirin plain, buffered or children's or timed BC Tablets or powders Encaprin Orgaran Warfarin Sodium  Buff-a-Comp Enoxaparin Orudis Zorpin  Buff-a-Comp with Codeine Equegesic Os-Cal-Gesic   Buffaprin Excedrin plain, buffered or Extra Strength Oxalid   Bufferin Arthritis Strength Feldene Oxphenbutazone   Bufferin plain or Extra Strength Feldene Capsules Oxycodone with Aspirin   Bufferin with Codeine Fenoprofen Fenoprofen Pabalate or Pabalate-SF   Buffets II Flogesic Panagesic   Buffinol plain or Extra Strength Florinal or Florinal with Codeine Panwarfarin   Buf-Tabs Flurbiprofen Penicillamine   Butalbital Compound Four-way cold tablets Penicillin   Butazolidin Fragmin Pepto-Bismol   Carbenicillin Geminisyn Percodan   Carna Arthritis Reliever Geopen Persantine   Carprofen Gold's salt Persistin   Chloramphenicol Goody's Phenylbutazone   Chloromycetin Haltrain Piroxlcam    Clmetidine heparin Plaquenil   Cllnoril Hyco-pap Ponstel   Clofibrate Hydroxy chloroquine Propoxyphen         Before stopping any of these medications, be sure to consult the physician who ordered them.  Some, such as Coumadin (Warfarin) are ordered to prevent or treat serious conditions such as "deep thrombosis", "pumonary embolisms", and other heart problems.  The amount of time that you may need off of the medication may also vary with the medication and the reason for which you were taking it.  If you are taking any of these medications, please make sure you notify your pain physician before you undergo any procedures.         Occipital Nerve Block Patient Information  Description: The occipital nerves originate in the cervical (neck) spinal cord and travel upward through muscle and tissue to supply sensation to the back of the head and top of the scalp.  In addition, the nerves control some of the muscles of the scalp.  Occipital neuralgia is an irritation of these nerves which can cause headaches, numbness of the scalp, and neck discomfort.     The occipital nerve block will interrupt nerve transmission through these nerves and can relieve pain and spasm.  The block consists of insertion of a small needle under the skin in the back of the head to deposit local anesthetic (numbing medicine) and/or steroids around the nerve.  The entire block usually lasts less than 5 minutes.  Conditions which may be treated by occipital blocks:   Muscular pain and spasm of the scalp  Nerve irritation, back of the head  Headaches  Upper neck pain  Preparation for the injection:  12. Do not eat any solid food or dairy products within 8 hours of your appointment. 13. You may drink clear liquids up to 3 hours before appointment.  Clear liquids include water, black coffee, juice or soda.  No milk or cream please. 14. You may take your regular medication, including  pain medications, with a sip of  water before you appointment.  Diabetics should hold regular insulin (if taken separately) and take 1/2 normal NPH dose the morning of the procedure.  Carry some sugar containing items with you to your appointment. 15. A driver must accompany you and be prepared to drive you home after your procedure. 16. Bring all your current medications with you. 17. An IV may be inserted and sedation may be given at the discretion of the physician. 18. A blood pressure cuff, EKG, and other monitors will often be applied during the procedure.  Some patients may need to have extra oxygen administered for a short period. 19. You will be asked to provide medical information, including your allergies and medications, prior to the procedure.  We must know immediately if you are taking blood thinners (like Coumadin/Warfarin) or if you are allergic to IV iodine contrast (dye).  We must know if you could possible be pregnant.  20. Do not wear a high collared shirt or turtleneck.  Tie long hair up in the back if possible.  Possible side-effects:   Bleeding from needle site  Infection (rare, may require surgery)  Nerve injury (rare)  Hair on back of neck can be tinged with iodine scrub (this will wash out)  Light-headedness (temporary)  Pain at injection site (several days)  Decreased blood pressure (rare, temporary)  Seizure (very rare)  Call if you experience:   Hives or difficulty breathing ( go to the emergency room)  Inflammation or drainage at the injection site(s)  Please note:  Although the local anesthetic injected can often make your painful muscles or headache feel good for several hours after the injection, the pain may return.  It takes 3-7 days for steroids to work.  You may not notice any pain relief for at least one week.  If effective, we will often do a series of injections spaced 3-6 weeks apart to maximally decrease your pain.  If you have any questions, please call [phone] [facility]

## 2017-01-20 NOTE — Progress Notes (Signed)
Nursing Pain Medication Assessment:  Safety precautions to be maintained throughout the outpatient stay will include: orient to surroundings, keep bed in low position, maintain call bell within reach at all times, provide assistance with transfer out of bed and ambulation.  Medication Inspection Compliance: Pill count conducted under aseptic conditions, in front of the patient. Neither the pills nor the bottle was removed from the patient's sight at any time. Once count was completed pills were immediately returned to the patient in their original bottle.  Medication: See above Pill/Patch Count: 90 of 90 pills remain Bottle Appearance: Standard pharmacy container. Clearly labeled. Filled Date: 02 / 04 / 2018 Last Medication intake:  Day before yesterday

## 2017-01-26 ENCOUNTER — Encounter: Payer: Self-pay | Admitting: Pain Medicine

## 2017-01-26 ENCOUNTER — Ambulatory Visit
Admission: RE | Admit: 2017-01-26 | Discharge: 2017-01-26 | Disposition: A | Discharge status: Home / Self Care | Payer: Medicare Other | Source: Ambulatory Visit | Attending: Pain Medicine | Admitting: Pain Medicine

## 2017-01-26 ENCOUNTER — Ambulatory Visit (HOSPITAL_BASED_OUTPATIENT_CLINIC_OR_DEPARTMENT_OTHER): Payer: Medicare Other | Admitting: Pain Medicine

## 2017-01-26 VITALS — BP 110/49 | HR 69 | Temp 96.5°F | Resp 18 | Ht 63.0 in | Wt 230.0 lb

## 2017-01-26 DIAGNOSIS — Z90710 Acquired absence of both cervix and uterus: Secondary | ICD-10-CM | POA: Diagnosis not present

## 2017-01-26 DIAGNOSIS — R51 Headache: Secondary | ICD-10-CM | POA: Diagnosis not present

## 2017-01-26 DIAGNOSIS — M47812 Spondylosis without myelopathy or radiculopathy, cervical region: Secondary | ICD-10-CM | POA: Diagnosis not present

## 2017-01-26 DIAGNOSIS — M5481 Occipital neuralgia: Secondary | ICD-10-CM

## 2017-01-26 DIAGNOSIS — Z9889 Other specified postprocedural states: Secondary | ICD-10-CM | POA: Insufficient documentation

## 2017-01-26 DIAGNOSIS — G4486 Cervicogenic headache: Secondary | ICD-10-CM

## 2017-01-26 DIAGNOSIS — Z79899 Other long term (current) drug therapy: Secondary | ICD-10-CM | POA: Diagnosis not present

## 2017-01-26 DIAGNOSIS — M47892 Other spondylosis, cervical region: Secondary | ICD-10-CM

## 2017-01-26 MED ORDER — LACTATED RINGERS IV SOLN
1000.0000 mL | Freq: Once | INTRAVENOUS | Status: AC
  Administered 2017-01-26: 1000 mL via INTRAVENOUS

## 2017-01-26 MED ORDER — FENTANYL CITRATE (PF) 100 MCG/2ML IJ SOLN
25.0000 ug | INTRAMUSCULAR | Status: DC | PRN
Start: 2017-01-26 — End: 2017-01-26
  Administered 2017-01-26: 100 ug via INTRAVENOUS

## 2017-01-26 MED ORDER — DEXAMETHASONE SODIUM PHOSPHATE 10 MG/ML IJ SOLN
10.0000 mg | Freq: Once | INTRAMUSCULAR | Status: AC
  Administered 2017-01-26: 10 mg
  Filled 2017-01-26: qty 1

## 2017-01-26 MED ORDER — LIDOCAINE HCL (PF) 1 % IJ SOLN: 10.0000 mL | Freq: Once | INTRAMUSCULAR | Status: DC

## 2017-01-26 MED ORDER — MIDAZOLAM HCL 5 MG/5ML IJ SOLN
INTRAMUSCULAR | Status: AC
  Filled 2017-01-26: qty 5

## 2017-01-26 MED ORDER — ROPIVACAINE HCL 5 MG/ML IJ SOLN
5.0000 mL | Freq: Once | INTRAMUSCULAR | Status: AC
  Administered 2017-01-26: 20 mL via EPIDURAL

## 2017-01-26 MED ORDER — MIDAZOLAM HCL 5 MG/5ML IJ SOLN
1.0000 mg | INTRAMUSCULAR | Status: DC | PRN
  Administered 2017-01-26: 3 mg via INTRAVENOUS

## 2017-01-26 MED ORDER — ROPIVACAINE HCL 5 MG/ML IJ SOLN
INTRAMUSCULAR | Status: AC
  Filled 2017-01-26: qty 20

## 2017-01-26 MED ORDER — DEXAMETHASONE SODIUM PHOSPHATE 4 MG/ML IJ SOLN
INTRAMUSCULAR | Status: AC
  Filled 2017-01-26: qty 3

## 2017-01-26 MED ORDER — FENTANYL CITRATE (PF) 100 MCG/2ML IJ SOLN
INTRAMUSCULAR | Status: AC
  Filled 2017-01-26: qty 2

## 2017-01-26 NOTE — Patient Instructions (Signed)
Pain Management Discharge Instructions  General Discharge Instructions :  If you need to reach your doctor call: Monday-Friday 8:00 am - 4:00 pm at [phone] or toll free [phone].  After clinic hours [phone] to have operator reach doctor.  Bring all of your medication bottles to all your appointments in the pain clinic.  To cancel or reschedule your appointment with Pain Management please remember to call 24 hours in advance to avoid a fee.  Refer to the educational materials which you have been given on: General Risks, I had my Procedure. Discharge Instructions, Post Sedation.  Post Procedure Instructions:  The drugs you were given will stay in your system until tomorrow, so for the next 24 hours you should not drive, make any legal decisions or drink any alcoholic beverages.  You may eat anything you prefer, but it is better to start with liquids then soups and crackers, and gradually work up to solid foods.  Please notify your doctor immediately if you have any unusual bleeding, trouble breathing or pain that is not related to your normal pain.  Depending on the type of procedure that was done, some parts of your body may feel week and/or numb.  This usually clears up by tonight or the next day.  Walk with the use of an assistive device or accompanied by an adult for the 24 hours.  You may use ice on the affected area for the first 24 hours.  Put ice in a Ziploc bag and cover with a towel and place against area 15 minutes on 15 minutes off.  You may switch to heat after 24 hours.  Occipital Nerve Block Patient Information  Description: The occipital nerves originate in the cervical (neck) spinal cord and travel upward through muscle and tissue to supply sensation to the back of the head and top of the scalp.  In addition, the nerves control some of the muscles of the scalp.  Occipital neuralgia is an irritation of these nerves which can cause headaches, numbness of the  scalp, and neck discomfort.     The occipital nerve block will interrupt nerve transmission through these nerves and can relieve pain and spasm.  The block consists of insertion of a small needle under the skin in the back of the head to deposit local anesthetic (numbing medicine) and/or steroids around the nerve.  The entire block usually lasts less than 5 minutes.  Conditions which may be treated by occipital blocks:   Muscular pain and spasm of the scalp  Nerve irritation, back of the head  Headaches  Upper neck pain  Preparation for the injection:  1. Do not eat any solid food or dairy products within 8 hours of your appointment. 2. You may drink clear liquids up to 3 hours before appointment.  Clear liquids include water, black coffee, juice or soda.  No milk or cream please. 3. You may take your regular medication, including pain medications, with a sip of water before you appointment.  Diabetics should hold regular insulin (if taken separately) and take 1/2 normal NPH dose the morning of the procedure.  Carry some sugar containing items with you to your appointment. 4. A driver must accompany you and be prepared to drive you home after your procedure. 5. Bring all your current medications with you. 6. An IV may be inserted and sedation may be given at the discretion of the physician. 7. A blood pressure cuff, EKG, and other monitors will often be applied during the procedure.    Some patients may need to have extra oxygen administered for a short period. 8. You will be asked to provide medical information, including your allergies and medications, prior to the procedure.  We must know immediately if you are taking blood thinners (like Coumadin/Warfarin) or if you are allergic to IV iodine contrast (dye).  We must know if you could possible be pregnant.  9. Do not wear a high collared shirt or turtleneck.  Tie long hair up in the back if possible.  Possible side-effects:   Bleeding  from needle site  Infection (rare, may require surgery)  Nerve injury (rare)  Hair on back of neck can be tinged with iodine scrub (this will wash out)  Light-headedness (temporary)  Pain at injection site (several days)  Decreased blood pressure (rare, temporary)  Seizure (very rare)  Call if you experience:   Hives or difficulty breathing ( go to the emergency room)  Inflammation or drainage at the injection site(s)  Please note:  Although the local anesthetic injected can often make your painful muscles or headache feel good for several hours after the injection, the pain may return.  It takes 3-7 days for steroids to work.  You may not notice any pain relief for at least one week.  If effective, we will often do a series of injections spaced 3-6 weeks apart to maximally decrease your pain.  If you have any questions, please call [phone] [facility]  

## 2017-01-26 NOTE — Progress Notes (Signed)
Safety precautions to be maintained throughout the outpatient stay will include: orient to surroundings, keep bed in low position, maintain call bell within reach at all times, provide assistance with transfer out of bed and ambulation.  

## 2017-01-26 NOTE — Progress Notes (Signed)
Patient's Name: Amber Acosta  MRN: [id number]  Referring Provider: Delano Metz, MD  DOB: 11/30/1951  PCP: Rayetta Humphrey, MD  DOS: 01/26/2017  Note by: Sydnee Levans. Laban Emperor, MD  Service setting: Ambulatory outpatient  Location: [facility] (AMB) Pain Management Facility  Visit type: Procedure  Specialty: Interventional Pain Management  Patient type: Established   Primary Reason for Visit: Interventional Pain Management Treatment. CC: Headache (left)  Procedure:  Anesthesia, Analgesia, Anxiolysis:  Type: Diagnostic, Greater, Occipital Nerve Block Region: Posterolateral Cervical Level: Occipital Ridge   Laterality: Left-Sided  Type: Local Anesthesia with Moderate (Conscious) Sedation Local Anesthetic: Lidocaine 1% Route: Intravenous (IV) IV Access: Secured Sedation: Meaningful verbal contact was maintained at all times during the procedure  Indication(s): Analgesia and Anxiety  Indications: 1. Occipital neuralgia (Left)   2. Cervicogenic headache (Left)   3. Other osteoarthritis of spine, cervical region    Pain Score: Pre-procedure: 4 /10 Post-procedure: 0-No pain/10  Pre-op Assessment:  Previous date of service: 01/20/17 Service provided: Evaluation Amber Acosta is a 66 y.o. (year old), female patient, seen today for interventional treatment. She  has a past surgical history that includes neck fusion; Breast surgery; Back surgery; Knee arthroscopy; Fracture surgery; Hand surgery; and Abdominal hysterectomy. Her primarily concern today is the Headache (left)  Initial Vital Signs: Blood pressure (!) 134/57, pulse 86, temperature 98.1 F (36.7 C), resp. rate 18, height 5\' 3"  (1.6 m), weight 230 lb (104.3 kg), SpO2 97 %. BMI: 40.74 kg/m  Risk Assessment: Allergies: Reviewed. She is allergic to enbrel [etanercept]; metformin; methotrexate; mushroom extract complex; other; darvon [propoxyphene]; minocycline; and ciprofloxacin.  Allergy Precautions: None required Coagulopathies: "Reviewed.  None identified.  Blood-thinner therapy: None at this time Active Infection(s): Reviewed. None identified. Amber Acosta is afebrile  Site Confirmation: Amber Acosta was asked to confirm the procedure and laterality before marking the site Procedure checklist: Completed Consent: Before the procedure and under the influence of no sedative(s), amnesic(s), or anxiolytics, the patient was informed of the treatment options, risks and possible complications. To fulfill our ethical and legal obligations, as recommended by the American Medical Association's Code of Ethics, I have informed the patient of my clinical impression; the nature and purpose of the treatment or procedure; the risks, benefits, and possible complications of the intervention; the alternatives, including doing nothing; the risk(s) and benefit(s) of the alternative treatment(s) or procedure(s); and the risk(s) and benefit(s) of doing nothing. The patient was provided information about the general risks and possible complications associated with the procedure. These may include, but are not limited to: failure to achieve desired goals, infection, bleeding, organ or nerve damage, allergic reactions, paralysis, and death. In addition, the patient was informed of those risks and complications associated to the procedure, such as failure to decrease pain; infection; bleeding; organ or nerve damage with subsequent damage to sensory, motor, and/or autonomic systems, resulting in permanent pain, numbness, and/or weakness of one or several areas of the body; allergic reactions; (i.e.: anaphylactic reaction); and/or death. Furthermore, the patient was informed of those risks and complications associated with the medications. These include, but are not limited to: allergic reactions (i.e.: anaphylactic or anaphylactoid reaction(s)); adrenal axis suppression; blood sugar elevation that in diabetics may result in ketoacidosis or comma; water retention that in  patients with history of congestive heart failure may result in shortness of breath, pulmonary edema, and decompensation with resultant heart failure; weight gain; swelling or edema; medication-induced neural toxicity; particulate matter embolism and blood vessel occlusion with resultant organ, and/or  nervous system infarction; and/or aseptic necrosis of one or more joints. Finally, the patient was informed that Medicine is not an exact science; therefore, there is also the possibility of unforeseen or unpredictable risks and/or possible complications that may result in a catastrophic outcome. The patient indicated having understood very clearly. We have given the patient no guarantees and we have made no promises. Enough time was given to the patient to ask questions, all of which were answered to the patient's satisfaction. Amber Acosta has indicated that she wanted to continue with the procedure. Attestation: I, the ordering provider, attest that I have discussed with the patient the benefits, risks, side-effects, alternatives, likelihood of achieving goals, and potential problems during recovery for the procedure that I have provided informed consent. Date: 01/26/2017; Time: 9:20 AM  Pre-Procedure Preparation:  Monitoring: As per clinic protocol. Respiration, ETCO2, SpO2, BP, heart rate and rhythm monitor placed and checked for adequate function Safety Precautions: Patient was assessed for positional comfort and pressure points before starting the procedure. Time-out: I initiated and conducted the "Time-out" before starting the procedure, as per protocol. The patient was asked to participate by confirming the accuracy of the "Time Out" information. Verification of the correct person, site, and procedure were performed and confirmed by me, the nursing staff, and the patient. "Time-out" conducted as per Joint Commission's Universal Protocol (UP.01.01.01). "Time-out" Date & Time: 01/26/2017; 1002  hrs.  Description of Procedure Process:   Position: Prone Target Area: Area medial to the occipital artery at the level of the superior nuchal ridge Approach: Posterior approach Area Prepped: Entire Posterior Occipital Region Prepping solution: ChloraPrep (2% chlorhexidine gluconate and 70% isopropyl alcohol) Safety Precautions: Aspiration looking for blood return was conducted prior to all injections. At no point did we inject any substances, as a needle was being advanced. No attempts were made at seeking any paresthesias. Safe injection practices and needle disposal techniques used. Medications properly checked for expiration dates. SDV (single dose vial) medications used. Description of the Procedure: Protocol guidelines were followed. The target area was identified and the area prepped in the usual manner. Skin & deeper tissues infiltrated with local anesthetic. Appropriate amount of time allowed to pass for local anesthetics to take effect. The procedure needles were then advanced to the target area. Proper needle placement secured. Negative aspiration confirmed. Solution injected in intermittent fashion, asking for systemic symptoms every 0.5cc of injectate. The needles were then removed and the area cleansed, making sure to leave some of the prepping solution back to take advantage of its long term bactericidal properties. Vitals:   01/26/17 1021 01/26/17 1031 01/26/17 1041 01/26/17 1044  BP: (!) 113/57 (!) 101/50 (!) 137/59 (!) 110/49  Pulse: 77 72 75 69  Resp: 17 14 20 18   Temp: (!) 96.5 F (35.8 C)     SpO2:  97% 97% 99%  Weight:      Height:        Start Time: 1004 hrs. End Time: 1009 hrs. Materials:  Needle(s) Type: Regular needle Gauge: 22G Length: 3.5-in Medication(s): We administered fentaNYL, lactated ringers, midazolam, dexamethasone, and ropivacaine (PF) 5 mg/mL (0.5%). Please see chart orders for dosing details.  Imaging Guidance (Non-Spinal):  Type of Imaging  Technique: Fluoroscopy Guidance (Non-Spinal) Indication(s): Assistance in needle guidance and placement for procedures requiring needle placement in or near specific anatomical locations not easily accessible without such assistance. Exposure Time: Please see nurses notes. Contrast: None used. Fluoroscopic Guidance: I was personally present during the use of fluoroscopy. "  Tunnel Vision Technique" used to obtain the best possible view of the target area. Parallax error corrected before commencing the procedure. "Direction-depth-direction" technique used to introduce the needle under continuous pulsed fluoroscopy. Once target was reached, antero-posterior, oblique, and lateral fluoroscopic projection used confirm needle placement in all planes. Images permanently stored in EMR. Interpretation: No contrast injected. I personally interpreted the imaging intraoperatively. Adequate needle placement confirmed in multiple planes. Permanent images saved into the patient's record.  Antibiotic Prophylaxis:  Indication(s): None identified Antibiotic given: None  Post-operative Assessment:  EBL: None Complications: No immediate post-treatment complications observed by team, or reported by patient. Note: The patient tolerated the entire procedure well. A repeat set of vitals were taken after the procedure and the patient was kept under observation following institutional policy, for this type of procedure. Post-procedural neurological assessment was performed, showing return to baseline, prior to discharge. The patient was provided with post-procedure discharge instructions, including a section on how to identify potential problems. Should any problems arise concerning this procedure, the patient was given instructions to immediately contact us, at any time, without hesitation. In any case, we plan to contact the patient by telephone for a follow-up status report regarding this interventional procedure. Comments:   No additional relevant information.  Plan of Care  Disposition: Discharge home  Discharge Date & Time: 01/26/2017; 1045 hrs.  Physician-requested Follow-up:  Return in about 2 weeks (around 02/09/2017) for Post-Procedure evaluation.  Future Appointments Date Time Provider Department Center  02/18/2017 2:00 PM Delano Metz, MD [facility] None   Medications ordered for procedure: Meds ordered this encounter  Medications  . fentaNYL (SUBLIMAZE) injection 25-50 mcg    Make sure Narcan is available in the pyxis when using this medication. In the event of respiratory depression (RR< 8/min): Titrate NARCAN (naloxone) in increments of 0.1 to 0.2 mg IV at 2-3 minute intervals, until desired degree of reversal.  . lactated ringers infusion 1,000 mL  . midazolam (VERSED) 5 MG/5ML injection 1-2 mg    Make sure Flumazenil is available in the pyxis when using this medication. If oversedation occurs, administer 0.2 mg IV over 15 sec. If after 45 sec no response, administer 0.2 mg again over 1 min; may repeat at 1 min intervals; not to exceed 4 doses (1 mg)  . dexamethasone (DECADRON) injection 10 mg  . lidocaine (PF) (XYLOCAINE) 1 % injection 10 mL  . ropivacaine (PF) 5 mg/mL (0.5%) (NAROPIN) injection 5 mL   Medications administered: We administered fentaNYL, lactated ringers, midazolam, dexamethasone, and ropivacaine (PF) 5 mg/mL (0.5%).  See the medical record for exact dosing, route, and time of administration.  Lab-work, Procedure(s), & Referral(s) Ordered: Orders Placed This Encounter  Procedures  . DG C-Arm 1-60 Min-No Report  . DG C-Arm 1-60 Min-No Report  . Discharge instructions  . Follow-up  . Informed Consent Details: Transcribe to consent form and obtain patient signature  . Provider attestation of informed consent for procedure/surgical case  . Verify informed consent   Imaging Ordered: Results for orders placed during the hospital encounter of 12/22/16  DG C-Arm 1-60 Min-No  Report   Narrative There is no Radiologist interpretation  for this exam.   New Prescriptions   No medications on file   Primary Care Physician: Rayetta Humphrey, MD Location: [facility] Note by: Sydnee Levans. Laban Emperor, M.D, DABA, DABAPM, DABPM, DABIPP, FIPP Date: 01/26/2017; Time: 11:13 AM  Disclaimer:  Medicine is not an Visual merchandiser. The only guarantee in medicine is  that nothing is guaranteed. It is important to note that the decision to proceed with this intervention was based on the information collected from the patient. The Data and conclusions were drawn from the patient's questionnaire, the interview, and the physical examination. Because the information was provided in large part by the patient, it cannot be guaranteed that it has not been purposely or unconsciously manipulated. Every effort has been made to obtain as much relevant data as possible for this evaluation. It is important to note that the conclusions that lead to this procedure are derived in large part from the available data. Always take into account that the treatment will also be dependent on availability of resources and existing treatment guidelines, considered by other Pain Management Practitioners as being common knowledge and practice, at the time of the intervention. For Medico-Legal purposes, it is also important to point out that variation in procedural techniques and pharmacological choices are the acceptable norm. The indications, contraindications, technique, and results of the above procedure should only be interpreted and judged by a Board-Certified Interventional Pain Specialist with extensive familiarity and expertise in the same exact procedure and technique. Attempts at providing opinions without similar or greater experience and expertise than that of the treating physician will be considered as inappropriate and unethical, and shall result in a formal complaint to the state medical  board and applicable specialty societies.  Instructions provided at this appointment: Patient Instructions  Pain Management Discharge Instructions  General Discharge Instructions :  If you need to reach your doctor call: Monday-Friday 8:00 am - 4:00 pm at [phone] or toll free [phone].  After clinic hours [phone] to have operator reach doctor.  Bring all of your medication bottles to all your appointments in the pain clinic.  To cancel or reschedule your appointment with Pain Management please remember to call 24 hours in advance to avoid a fee.  Refer to the educational materials which you have been given on: General Risks, I had my Procedure. Discharge Instructions, Post Sedation.  Post Procedure Instructions:  The drugs you were given will stay in your system until tomorrow, so for the next 24 hours you should not drive, make any legal decisions or drink any alcoholic beverages.  You may eat anything you prefer, but it is better to start with liquids then soups and crackers, and gradually work up to solid foods.  Please notify your doctor immediately if you have any unusual bleeding, trouble breathing or pain that is not related to your normal pain.  Depending on the type of procedure that was done, some parts of your body may feel week and/or numb.  This usually clears up by tonight or the next day.  Walk with the use of an assistive device or accompanied by an adult for the 24 hours.  You may use ice on the affected area for the first 24 hours.  Put ice in a Ziploc bag and cover with a towel and place against area 15 minutes on 15 minutes off.  You may switch to heat after 24 hours.Occipital Nerve Block Patient Information  Description: The occipital nerves originate in the cervical (neck) spinal cord and travel upward through muscle and tissue to supply sensation to the back of the head and top of the scalp.  In addition, the nerves control some of the muscles of  the scalp.  Occipital neuralgia is an irritation of these nerves which can cause headaches, numbness of the scalp, and neck discomfort.  The occipital nerve block will interrupt nerve transmission through these nerves and can relieve pain and spasm.  The block consists of insertion of a small needle under the skin in the back of the head to deposit local anesthetic (numbing medicine) and/or steroids around the nerve.  The entire block usually lasts less than 5 minutes.  Conditions which may be treated by occipital blocks:   Muscular pain and spasm of the scalp  Nerve irritation, back of the head  Headaches  Upper neck pain  Preparation for the injection:  1. Do not eat any solid food or dairy products within 8 hours of your appointment. 2. You may drink clear liquids up to 3 hours before appointment.  Clear liquids include water, black coffee, juice or soda.  No milk or cream please. 3. You may take your regular medication, including pain medications, with a sip of water before you appointment.  Diabetics should hold regular insulin (if taken separately) and take 1/2 normal NPH dose the morning of the procedure.  Carry some sugar containing items with you to your appointment. 4. A driver must accompany you and be prepared to drive you home after your procedure. 5. Bring all your current medications with you. 6. An IV may be inserted and sedation may be given at the discretion of the physician. 7. A blood pressure cuff, EKG, and other monitors will often be applied during the procedure.  Some patients may need to have extra oxygen administered for a short period. 8. You will be asked to provide medical information, including your allergies and medications, prior to the procedure.  We must know immediately if you are taking blood thinners (like Coumadin/Warfarin) or if you are allergic to IV iodine contrast (dye).  We must know if you could possible be pregnant.  9. Do not wear a high  collared shirt or turtleneck.  Tie long hair up in the back if possible.  Possible side-effects:   Bleeding from needle site  Infection (rare, may require surgery)  Nerve injury (rare)  Hair on back of neck can be tinged with iodine scrub (this will wash out)  Light-headedness (temporary)  Pain at injection site (several days)  Decreased blood pressure (rare, temporary)  Seizure (very rare)  Call if you experience:   Hives or difficulty breathing ( go to the emergency room)  Inflammation or drainage at the injection site(s)  Please note:  Although the local anesthetic injected can often make your painful muscles or headache feel good for several hours after the injection, the pain may return.  It takes 3-7 days for steroids to work.  You may not notice any pain relief for at least one week.  If effective, we will often do a series of injections spaced 3-6 weeks apart to maximally decrease your pain.  If you have any questions, please call [phone] [facility]

## 2017-01-27 ENCOUNTER — Telehealth: Payer: Self-pay | Admitting: *Deleted

## 2017-01-27 NOTE — Telephone Encounter (Signed)
Voicemail left with patient to call our office if there are questions or concerns re; Procedure on yesterday.

## 2017-02-18 ENCOUNTER — Ambulatory Visit: Payer: Medicare Other | Attending: Pain Medicine | Admitting: Pain Medicine

## 2017-02-18 ENCOUNTER — Encounter: Payer: Self-pay | Admitting: Pain Medicine

## 2017-02-18 VITALS — BP 107/48 | HR 71 | Temp 98.0°F | Resp 18 | Ht 63.0 in | Wt 230.0 lb

## 2017-02-18 DIAGNOSIS — E559 Vitamin D deficiency, unspecified: Secondary | ICD-10-CM | POA: Insufficient documentation

## 2017-02-18 DIAGNOSIS — R51 Headache: Secondary | ICD-10-CM | POA: Diagnosis present

## 2017-02-18 DIAGNOSIS — H8110 Benign paroxysmal vertigo, unspecified ear: Secondary | ICD-10-CM | POA: Diagnosis not present

## 2017-02-18 DIAGNOSIS — G8929 Other chronic pain: Secondary | ICD-10-CM | POA: Diagnosis not present

## 2017-02-18 DIAGNOSIS — I10 Essential (primary) hypertension: Secondary | ICD-10-CM | POA: Insufficient documentation

## 2017-02-18 DIAGNOSIS — E785 Hyperlipidemia, unspecified: Secondary | ICD-10-CM | POA: Insufficient documentation

## 2017-02-18 DIAGNOSIS — M542 Cervicalgia: Secondary | ICD-10-CM

## 2017-02-18 DIAGNOSIS — M47816 Spondylosis without myelopathy or radiculopathy, lumbar region: Secondary | ICD-10-CM

## 2017-02-18 DIAGNOSIS — J449 Chronic obstructive pulmonary disease, unspecified: Secondary | ICD-10-CM | POA: Diagnosis not present

## 2017-02-18 DIAGNOSIS — K219 Gastro-esophageal reflux disease without esophagitis: Secondary | ICD-10-CM | POA: Insufficient documentation

## 2017-02-18 DIAGNOSIS — M25511 Pain in right shoulder: Secondary | ICD-10-CM | POA: Diagnosis not present

## 2017-02-18 DIAGNOSIS — G894 Chronic pain syndrome: Secondary | ICD-10-CM | POA: Insufficient documentation

## 2017-02-18 DIAGNOSIS — M47812 Spondylosis without myelopathy or radiculopathy, cervical region: Secondary | ICD-10-CM | POA: Insufficient documentation

## 2017-02-18 DIAGNOSIS — M1288 Other specific arthropathies, not elsewhere classified, other specified site: Secondary | ICD-10-CM

## 2017-02-18 DIAGNOSIS — K824 Cholesterolosis of gallbladder: Secondary | ICD-10-CM | POA: Insufficient documentation

## 2017-02-18 DIAGNOSIS — E1142 Type 2 diabetes mellitus with diabetic polyneuropathy: Secondary | ICD-10-CM | POA: Diagnosis not present

## 2017-02-18 DIAGNOSIS — E1122 Type 2 diabetes mellitus with diabetic chronic kidney disease: Secondary | ICD-10-CM | POA: Insufficient documentation

## 2017-02-18 DIAGNOSIS — M6281 Muscle weakness (generalized): Secondary | ICD-10-CM | POA: Diagnosis not present

## 2017-02-18 DIAGNOSIS — F329 Major depressive disorder, single episode, unspecified: Secondary | ICD-10-CM | POA: Insufficient documentation

## 2017-02-18 DIAGNOSIS — N3281 Overactive bladder: Secondary | ICD-10-CM | POA: Diagnosis not present

## 2017-02-18 DIAGNOSIS — M25512 Pain in left shoulder: Secondary | ICD-10-CM | POA: Diagnosis not present

## 2017-02-18 DIAGNOSIS — M545 Low back pain: Secondary | ICD-10-CM | POA: Diagnosis not present

## 2017-02-18 DIAGNOSIS — R29898 Other symptoms and signs involving the musculoskeletal system: Secondary | ICD-10-CM | POA: Insufficient documentation

## 2017-02-18 DIAGNOSIS — M549 Dorsalgia, unspecified: Secondary | ICD-10-CM

## 2017-02-18 DIAGNOSIS — M797 Fibromyalgia: Secondary | ICD-10-CM | POA: Insufficient documentation

## 2017-02-18 DIAGNOSIS — I5032 Chronic diastolic (congestive) heart failure: Secondary | ICD-10-CM | POA: Diagnosis not present

## 2017-02-18 DIAGNOSIS — E1140 Type 2 diabetes mellitus with diabetic neuropathy, unspecified: Secondary | ICD-10-CM | POA: Insufficient documentation

## 2017-02-18 DIAGNOSIS — Z79891 Long term (current) use of opiate analgesic: Secondary | ICD-10-CM | POA: Insufficient documentation

## 2017-02-18 DIAGNOSIS — E669 Obesity, unspecified: Secondary | ICD-10-CM | POA: Diagnosis not present

## 2017-02-18 DIAGNOSIS — N189 Chronic kidney disease, unspecified: Secondary | ICD-10-CM | POA: Insufficient documentation

## 2017-02-18 DIAGNOSIS — N3946 Mixed incontinence: Secondary | ICD-10-CM | POA: Diagnosis not present

## 2017-02-18 DIAGNOSIS — M5020 Other cervical disc displacement, unspecified cervical region: Secondary | ICD-10-CM | POA: Diagnosis not present

## 2017-02-18 DIAGNOSIS — G4486 Cervicogenic headache: Secondary | ICD-10-CM

## 2017-02-18 DIAGNOSIS — M5386 Other specified dorsopathies, lumbar region: Secondary | ICD-10-CM | POA: Insufficient documentation

## 2017-02-18 DIAGNOSIS — M5459 Other low back pain: Secondary | ICD-10-CM

## 2017-02-18 NOTE — Progress Notes (Signed)
Patient's Name: Amber Acosta  MRN: [id number]  Referring Provider: Sharyne Peach, MD  DOB: 1950-12-29  PCP: Sharyne Peach, MD  DOS: 02/18/2017  Note by: Kathlen Brunswick. Dossie Arbour, MD  Service setting: Ambulatory outpatient  Specialty: Interventional Pain Management  Location: [facility] (AMB) Pain Management Facility    Patient type: Established   Primary Reason(s) for Visit: Encounter for post-procedure evaluation of chronic illness with mild to moderate exacerbation CC: Headache (left side)  HPI  Amber Acosta is a 66 y.o. year old, female patient, who comes today for a post-procedure evaluation. She has Cervical vertebral fusion (Uncomplicated C5 through C7 ACDF.); Fibromyalgia; Lumbar facet joint pain; Lumbar facet syndrome (Bilateral) (R>L); Chronic kidney disease; COPD (chronic obstructive pulmonary disease) ([place]); Depression; Type 2 diabetes mellitus ([place]); Hypertensive heart disease with congestive heart failure ([place]); Gall bladder polyp; GERD (gastroesophageal reflux disease); Hyperlipemia; Inflammatory polyarthropathy ([place]); Sleep terror; Obesity, Class II, BMI 35-39.9, with comorbidity; OP (osteoporosis); Psoriasis; Kidney cysts; Muscle weakness (generalized); Sarcoidosis ([place]); Thrombocytopenia ([place]); Vitamin D deficiency; Chronic neck pain (Location of Secondary source of pain) (Bilateral) (L>R); Cervicogenic headache (Left); Cervical facet syndrome (Left); Chronic shoulder pain (Location of Tertiary source of pain) (Bilateral) (L>R); At risk for falls; Balance problems; History of closed head injury; Opiate use (22.5 MME/Day); Long term current use of opiate analgesic; Encounter for therapeutic drug level monitoring; Long term prescription opiate use; Spondylolisthesis of cervical region; 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[place]); Hx of cervical spine surgery; Low magnesium levels; Myofascial pain; Neuropathy ([place]); Benign paroxysmal positional vertigo; Diabetic peripheral neuropathy ([place]); Meralgia paresthetica  (Right); Cervical spondylosis; Chronic diastolic congestive heart failure ([place]); Mixed stress and urge urinary incontinence; Overactive detrusor; 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 Her primarily concern today is the Headache (left side)  Pain Assessment: Self-Reported Pain Score: 3 /10             Reported level is compatible with observation.       Pain Type: Chronic pain Pain Location: Head Pain Descriptors / Indicators: Sharp, Tender Pain Frequency: Intermittent  Amber Acosta comes in today for post-procedure evaluation after the treatment done on 01/26/2017.  Further details on both, my assessment(s), as well as the proposed treatment plan, please see below.  Post-Procedure Assessment  01/26/2017 Procedure: Diagnostic left GONB under fluoro and IV sedation. Post-procedure pain score: 0/10 (100% relief) Influential Factors: BMI: 40.74 kg/m Intra-procedural challenges: None observed Assessment challenges: None detected         Post-procedural side-effects, adverse reactions, or complications: None reported Reported issues: None  Sedation: Sedation provided. When no sedatives are used, the analgesic levels obtained are directly associated to the effectiveness of the local anesthetics. However, when sedation is provided, the level of analgesia obtained during the initial 1 hour following the intervention, is believed to be the result of a combination of factors. These factors may include, but are not limited to: 1. The effectiveness  of the local anesthetics used. 2. The effects of the analgesic(s) and/or anxiolytic(s) used. 3. The degree of discomfort experienced by the patient at the time of the procedure. 4. The patients ability and reliability in recalling and recording  the events. 5. The presence and influence of possible secondary gains and/or psychosocial factors. Reported result: Relief experienced during the 1st hour after the procedure: 100 % (Ultra-Short Term Relief) Interpretative annotation: Analgesia during this period is likely to be Local Anesthetic and/or IV Sedative (Analgesic/Anxiolitic) related.          Effects of local anesthetic: The analgesic effects attained during this period are directly associated to the localized infiltration of local anesthetics and therefore cary significant diagnostic value as to the etiological location, or anatomical origin, of the pain. Expected duration of relief is directly dependent on the pharmacodynamics of the local anesthetic used. Long-acting (4-6 hours) anesthetics used.  Reported result: Relief during the next 4 to 6 hour after the procedure: 100 % (Short-Term Relief) Interpretative annotation: Complete relief would suggest area to be the source of the pain.          Long-term benefit: Defined as the period of time past the expected duration of local anesthetics. With the possible exception of prolonged sympathetic blockade from the local anesthetics, benefits during this period are typically attributed to, or associated with, other factors such as analgesic sensory neuropraxia, antiinflammatory effects, or beneficial biochemical changes provided by agents other than the local anesthetics Reported result: Extended relief following procedure: 0 % (Long-Term Relief) Interpretative annotation: No benefit. This could suggest algesic mechanism to be mechanical rather than inflammatory.          Current benefits: Defined as persistent relief that continues at this point in  time.   Reported results: Treated area: 0 %       Interpretative annotation: Recurrance of symptoms. This would suggest persistent aggravating factors  Interpretation: Results would suggest a successful diagnostic intervention.          Laboratory Chemistry  Inflammation Markers Lab Results  Component Value Date   CRP 2.1 (H) 03/26/2016   ESRSEDRATE 52 (H) 03/26/2016   (CRP: Acute Phase) (ESR: Chronic Phase) Renal Function Markers Lab Results  Component Value Date   BUN 14 03/26/2016   CREATININE 1.28 (H) 03/26/2016   GFRAA 50 (L) 03/26/2016   GFRNONAA 44 (L) 03/26/2016   Hepatic Function Markers Lab Results  Component Value Date   AST 24 03/26/2016   ALT 20 03/26/2016   ALBUMIN 4.0 03/26/2016   ALKPHOS 70 03/26/2016   Electrolytes Lab Results  Component Value Date   NA 136 03/26/2016   K 3.8 03/26/2016   CL 100 (L) 03/26/2016   CALCIUM 8.5 (L) 03/26/2016   MG 1.8 03/26/2016   Neuropathy Markers Lab Results  Component Value Date   VITAMINB12 467 03/26/2016   Bone Pathology Markers Lab Results  Component Value Date   ALKPHOS 70 03/26/2016   VD25OH 20.2 (L) 03/26/2016   CALCIUM 8.5 (L) 03/26/2016   Coagulation Parameters Lab Results  Component Value Date   PLT 146 (L) 03/26/2016   Cardiovascular Markers No results found for: BNP, HGB, HCT Note: Lab results reviewed.  Recent Diagnostic Imaging Review  Dg C-arm 1-60 Min-no Report  Result Date: 01/26/2017 Fluoroscopy was utilized by the requesting physician.  No radiographic interpretation.   Note: Imaging results reviewed.          Meds  The patient has a current medication list which includes the following prescription(s): albuterol, amlodipine, b-d ins syr ultrafine 1cc/30g, b-d ins syr ultrafine 1cc/31g, budesonide-formoterol, famotidine, fluoxetine, furosemide, furosemide, hydralazine, insulin nph human, klor-con 10, losartan, lovastatin, metoprolol succinate, nitroglycerin, nystatin, oxycodone,  oxycodone, phentermine, pregabalin, optichamber diamond,  tizanidine, trazodone, vitamin d (ergocalciferol), and oxycodone.  Current Outpatient Prescriptions on File Prior to Visit  Medication Sig  . albuterol (PROVENTIL HFA;VENTOLIN HFA) 108 (90 Base) MCG/ACT inhaler Inhale 2 puffs into the lungs every 6 (six) hours as needed.   Marland Kitchen amLODipine (NORVASC) 10 MG tablet Take 10 mg by mouth daily.   . B-D INS SYR ULTRAFINE 1CC/30G 30G X 1/2" 1 ML MISC   . budesonide-formoterol (SYMBICORT) 160-4.5 MCG/ACT inhaler Inhale 2 puffs into the lungs 2 (two) times daily.   Marland Kitchen FLUoxetine (PROZAC) 40 MG capsule Take 40 mg by mouth daily.   . furosemide (LASIX) 40 MG tablet Take 40 mg by mouth daily.  . hydrALAZINE (APRESOLINE) 25 MG tablet Take 25 mg by mouth 2 (two) times daily.   . insulin NPH Human (HUMULIN N,NOVOLIN N) 100 UNIT/ML injection NPH inject SQ bid 60 units  . KLOR-CON 10 10 MEQ tablet Take 10 mEq by mouth daily.   Marland Kitchen losartan (COZAAR) 100 MG tablet TAKE 1 TABLET (100 MG TOTAL) BY MOUTH ONCE DAILY.  Marland Kitchen lovastatin (MEVACOR) 10 MG tablet Take 10 mg by mouth at bedtime.  . metoprolol succinate (TOPROL-XL) 50 MG 24 hr tablet Take 25 mg by mouth daily.   . nitroGLYCERIN (NITROSTAT) 0.3 MG SL tablet 1 tablet sublingual prn for ESOPHAGEAL SPASMS(may repeat every 5 min, seek med help if pain persists after 3 tablets)  . oxyCODONE (OXY IR/ROXICODONE) 5 MG immediate release tablet Take 1 tablet (5 mg total) by mouth every 8 (eight) hours as needed for moderate pain or severe pain.  Derrill Memo ON 02/24/2017] oxyCODONE (OXY IR/ROXICODONE) 5 MG immediate release tablet Take 1 tablet (5 mg total) by mouth every 8 (eight) hours as needed for moderate pain or severe pain.  . phentermine 15 MG capsule TAKE ONE CAPSULE BY MOUTH EVERY AM BEFORE BREAKFAST  . pregabalin (LYRICA) 150 MG capsule Take 1 capsule (150 mg total) by mouth 2 (two) times daily.  Marland Kitchen tiZANidine (ZANAFLEX) 4 MG tablet Take 1 tablet (4 mg total) by mouth 2  (two) times daily as needed for muscle spasms.  . traZODone (DESYREL) 100 MG tablet Take 200 mg by mouth at bedtime.   . Vitamin D, Ergocalciferol, (DRISDOL) 50000 units CAPS capsule Take 50,000 Units by mouth every 30 (thirty) days.   Marland Kitchen oxyCODONE (OXY IR/ROXICODONE) 5 MG immediate release tablet Take 1 tablet (5 mg total) by mouth every 8 (eight) hours as needed for moderate pain or severe pain.   No current facility-administered medications on file prior to visit.    ROS  Constitutional: Denies any fever or chills Gastrointestinal: No reported hemesis, hematochezia, vomiting, or acute GI distress Musculoskeletal: Denies any acute onset joint swelling, redness, loss of ROM, or weakness Neurological: No reported episodes of acute onset apraxia, aphasia, dysarthria, agnosia, amnesia, paralysis, loss of coordination, or loss of consciousness  Allergies  Ms. Flahive is allergic to enbrel [etanercept]; metformin; methotrexate; mushroom extract complex; other; darvon [propoxyphene]; minocycline; and ciprofloxacin.  PFSH  Drug: Ms. Kellett  reports that she does not use drugs. Alcohol:  reports that she does not drink alcohol. Tobacco:  reports that she has quit smoking. She has quit using smokeless tobacco. Medical:  has a past medical history of Arthritis; Cervical spondylosis without myelopathy (09/21/2015); Chronic pain; Chronic pain associated with significant psychosocial dysfunction (08/28/2013); Depression; Diabetes mellitus without complication ([place]); Displacement of cervical intervertebral disc without myelopathy (09/05/2015); Fibromyalgia; GERD (gastroesophageal reflux disease); Hypertension; Kidney disease, chronic, stage III (GFR  30-59 ml/min); Sarcoid ([place]); and Thrombopenia ([place]). Family: family history includes Alcohol abuse in her brother; Alzheimer's disease in her mother; COPD in her father; Diabetes in her father; Heart disease in her father.  Past Surgical History:  Procedure  Laterality Date  . ABDOMINAL HYSTERECTOMY    . BACK SURGERY    . BREAST SURGERY    . FRACTURE SURGERY     nose  . HAND SURGERY    . KNEE ARTHROSCOPY     right  . neck fusion     Constitutional Exam  General appearance: Well nourished, well developed, and well hydrated. In no apparent acute distress Vitals:   02/18/17 1335  BP: (!) 107/48  Pulse: 71  Resp: 18  Temp: 98 F (36.7 C)  TempSrc: Oral  SpO2: 99%  Weight: 230 lb (104.3 kg)  Height: '5\' 3"'$  (1.6 m)   BMI Assessment: Estimated body mass index is 40.74 kg/m as calculated from the following:   Height as of this encounter: '5\' 3"'$  (1.6 m).   Weight as of this encounter: 230 lb (104.3 kg).  BMI interpretation table: BMI level Category Range association with higher incidence of chronic pain  <18 kg/m2 Underweight   18.5-24.9 kg/m2 Ideal body weight   25-29.9 kg/m2 Overweight Increased incidence by 20%  30-34.9 kg/m2 Obese (Class I) Increased incidence by 68%  35-39.9 kg/m2 Severe obesity (Class II) Increased incidence by 136%  >40 kg/m2 Extreme obesity (Class III) Increased incidence by 254%   BMI Readings from Last 4 Encounters:  02/18/17 40.74 kg/m  01/26/17 40.74 kg/m  01/20/17 40.74 kg/m  12/22/16 40.74 kg/m   Wt Readings from Last 4 Encounters:  02/18/17 230 lb (104.3 kg)  01/26/17 230 lb (104.3 kg)  01/20/17 230 lb (104.3 kg)  12/22/16 230 lb (104.3 kg)  Psych/Mental status: Alert, oriented x 3 (person, place, & time)       Eyes: PERLA Respiratory: No evidence of acute respiratory distress  Cervical Spine Exam  Inspection: No masses, redness, or swelling Alignment: Symmetrical Functional ROM: Limited ROM Stability: No instability detected Muscle strength & Tone: Functionally intact Sensory: Movement-associated pain Palpation: Complains of area being tender to palpation  Upper Extremity (UE) Exam    Side: Right upper extremity  Side: Left upper extremity  Inspection: No masses, redness,  swelling, or asymmetry. No contractures  Inspection: No masses, redness, swelling, or asymmetry. No contractures  Functional ROM: Unrestricted ROM          Functional ROM: Unrestricted ROM          Muscle strength & Tone: Functionally intact  Muscle strength & Tone: Functionally intact  Sensory: Unimpaired  Sensory: Unimpaired  Palpation: Euthermic  Palpation: Euthermic  Specialized Test(s): Deferred         Specialized Test(s): Deferred          Thoracic Spine Exam  Inspection: No masses, redness, or swelling Alignment: Symmetrical Functional ROM: Unrestricted ROM Stability: No instability detected Sensory: Unimpaired Muscle strength & Tone: Functionally intact Palpation: Non-contributory  Lumbar Spine Exam  Inspection: No masses, redness, or swelling Alignment: Symmetrical Functional ROM: Unrestricted ROM Stability: No instability detected Muscle strength & Tone: Functionally intact Sensory: Unimpaired Palpation: Non-contributory Provocative Tests: Lumbar Hyperextension and rotation test: evaluation deferred today       Patrick's Maneuver: evaluation deferred today              Gait & Posture Assessment  Ambulation: Unassisted Gait: Relatively normal for age and body habitus Posture: WNL  Lower Extremity Exam    Side: Right lower extremity  Side: Left lower extremity  Inspection: No masses, redness, swelling, or asymmetry. No contractures  Inspection: No masses, redness, swelling, or asymmetry. No contractures  Functional ROM: Unrestricted ROM          Functional ROM: Unrestricted ROM          Muscle strength & Tone: Functionally intact  Muscle strength & Tone: Functionally intact  Sensory: Unimpaired  Sensory: Unimpaired  Palpation: No palpable anomalies  Palpation: No palpable anomalies   Assessment  Primary Diagnosis & Pertinent Problem List: The primary encounter diagnosis was Chronic low back pain (Location of Primary Source of Pain) (Bilateral) (R>L). Diagnoses of  Lumbar facet syndrome (Bilateral) (R>L), Lumbar facet joint pain, Chronic neck pain (Location of Secondary source of pain) (Bilateral) (L>R), Chronic pain of both shoulders, Cervical facet syndrome (Left), Cervical spondylosis, and Cervicogenic headache (Left) were also pertinent to this visit.  Status Diagnosis  Controlled Controlled Controlled 1. Chronic low back pain (Location of Primary Source of Pain) (Bilateral) (R>L)   2. Lumbar facet syndrome (Bilateral) (R>L)   3. Lumbar facet joint pain   4. Chronic neck pain (Location of Secondary source of pain) (Bilateral) (L>R)   5. Chronic pain of both shoulders   6. Cervical facet syndrome (Left)   7. Cervical spondylosis   8. Cervicogenic headache (Left)      Plan of Care  Pharmacotherapy (Medications Ordered): No orders of the defined types were placed in this encounter.  New Prescriptions   No medications on file   Medications administered today: Ms. Stemmer had no medications administered during this visit. Lab-work, procedure(s), and/or referral(s): Orders Placed This Encounter  Procedures  . LUMBAR FACET(MEDIAL BRANCH NERVE BLOCK) MBNB  . CERVICAL FACET (MEDIAL BRANCH NERVE BLOCK)   . Radiofrequency,Cervical   Imaging and/or referral(s): None  Interventional therapies: Planned, scheduled, and/or pending:   Left Cervical Facet RFA under fluoro and IV sedation   Considering:   Palliative left sided cervical facet block #3 Diagnostic/palliative bilateral lumbar facet block Possible bilateral lumbar facet radiofrequency ablation Diagnostic/palliative left cervical facet block #2 Possible left-sided cervical facet radiofrequency ablation   Palliative PRN treatment(s):   Diagnostic/palliative bilateral lumbar facet block Diagnostic/palliative left cervical facet block #2 Palliative left sided cervical facet block #3   Provider-requested follow-up: Return for keep scheduled Med-Mgmt appointment, in addition,  Procedure.  Future Appointments Date Time Provider [place]  03/05/2017 8:45 AM Milinda Pointer, MD [facility] None   Primary Care Physician: Sharyne Peach, MD Location: [facility] Facility Note by: Kathlen Brunswick. Dossie Arbour, M.D, DABA, DABAPM, DABPM, DABIPP, FIPP Date: 02/18/2017; Time: 8:30 PM  Pain Score Disclaimer: We use the NRS-11 scale. This is a self-reported, subjective measurement of pain severity with only modest accuracy. It is used primarily to identify changes within a particular patient. It must be understood that outpatient pain scales are significantly less accurate that those used for research, where they can be applied under ideal controlled circumstances with minimal exposure to variables. In reality, the score is likely to be a combination of pain intensity and pain affect, where pain affect describes the degree of emotional arousal or changes in action readiness caused by the sensory experience of pain. Factors such as social and work situation, setting, emotional state, anxiety levels, expectation, and prior pain experience may influence pain perception and show large inter-individual differences that may also be affected by time variables.  Patient instructions provided during this  appointment: Patient Instructions  Preparing for Procedure with Sedation Instructions: . Oral Intake: Do not eat or drink anything for at least 8 hours prior to your procedure. . Transportation: Public transportation is not allowed. Bring an adult driver. The driver must be physically present in our waiting room before any procedure can be started. Marland Kitchen Physical Assistance: Bring an adult physically capable of assisting you, in the event you need help. This adult should keep you company at home for at least 6 hours after the procedure. . Blood Pressure Medicine: Take your blood pressure medicine with a sip of water the morning of the procedure. . Blood thinners:   . Diabetics on insulin: Notify the staff so that you can be scheduled 1st case in the morning. If your diabetes requires high dose insulin, take only  of your normal insulin dose the morning of the procedure and notify the staff that you have done so. . Preventing infections: Shower with an antibacterial soap the morning of your procedure. . Build-up your immune system: Take 1000 mg of Vitamin C with every meal (3 times a day) the day prior to your procedure. Marland Kitchen Antibiotics: Inform the staff if you have a condition or reason that requires you to take antibiotics before dental procedures. . Pregnancy: If you are pregnant, call and cancel the procedure. . Sickness: If you have a cold, fever, or any active infections, call and cancel the procedure. . Arrival: You must be in the facility at least 30 minutes prior to your scheduled procedure. . Children: Do not bring children with you. . Dress appropriately: Bring dark clothing that you would not mind if they get stained. . Valuables: Do not bring any jewelry or valuables. Procedure appointments are reserved for interventional treatments only. Marland Kitchen No Prescription Refills. . No medication changes will be discussed during procedure appointments. . No disability issues will be discussed.  ____________________________________________________________________________________________  Radiofrequency Lesioning Radiofrequency lesioning is a procedure that is performed to relieve pain. The procedure is often used for back, neck, or arm pain. Radiofrequency lesioning involves the use of a machine that creates radio waves to make heat. During the procedure, the heat is applied to the nerve that carries the pain signal. The heat damages the nerve and interferes with the pain signal. Pain relief usually starts about 2 weeks after the procedure and lasts for 6 months to 1 year. Tell a health care provider about:  Any allergies you have.  All medicines you are  taking, including vitamins, herbs, eye drops, creams, and over-the-counter medicines.  Any problems you or family members have had with anesthetic medicines.  Any blood disorders you have.  Any surgeries you have had.  Any medical conditions you have.  Whether you are pregnant or may be pregnant. What are the risks? Generally, this is a safe procedure. However, problems may occur, including:  Pain or soreness at the injection site.  Infection at the injection site.  Damage to nerves or blood vessels. What happens before the procedure?  Ask your health care provider about:  Changing or stopping your regular medicines. This is especially important if you are taking diabetes medicines or blood thinners.  Taking medicines such as aspirin and ibuprofen. These medicines can thin your blood. Do not take these medicines before your procedure if your health care provider instructs you not to.  Follow instructions from your health care provider about eating or drinking restrictions.  Plan to have someone take you home after the procedure.  If  you go home right after the procedure, plan to have someone with you for 24 hours. What happens during the procedure?  You will be given one or more of the following:  A medicine to help you relax (sedative).  A medicine to numb the area (local anesthetic).  You will be awake during the procedure. You will need to be able to talk with the health care provider during the procedure.  With the help of a type of X-ray (fluoroscopy), the health care provider will insert a radiofrequency needle into the area to be treated.  Next, a wire that carries the radio waves (electrode) will be put through the radiofrequency needle. An electrical pulse will be sent through the electrode to verify the correct nerve. You will feel a tingling sensation, and you may have muscle twitching.  Then, the tissue that is around the needle tip will be heated by an  electric current that is passed using the radiofrequency machine. This will numb the nerves.  A bandage (dressing) will be put on the insertion area after the procedure is done. The procedure may vary among health care providers and hospitals. What happens after the procedure?  Your blood pressure, heart rate, breathing rate, and blood oxygen level will be monitored often until the medicines you were given have worn off.  Return to your normal activities as directed by your health care provider. This information is not intended to replace advice given to you by your health care provider. Make sure you discuss any questions you have with your health care provider. Document Released: 07/16/2011 Document Revised: 04/24/2016 Document Reviewed: 12/25/2014 Elsevier Interactive Patient Education  2017 [facility]acet Blocks Patient Information  Description: The facets are joints in the spine between the vertebrae.  Like any joints in the body, facets can become irritated and painful.  Arthritis can also effect the facets.  By injecting steroids and local anesthetic in and around these joints, we can temporarily block the nerve supply to them.  Steroids act directly on irritated nerves and tissues to reduce selling and inflammation which often leads to decreased pain.  Facet blocks may be done anywhere along the spine from the neck to the low back depending upon the location of your pain.   After numbing the skin with local anesthetic (like Novocaine), a small needle is passed onto the facet joints under x-ray guidance.  You may experience a sensation of pressure while this is being done.  The entire block usually lasts about 15-25 minutes.   Conditions which may be treated by facet blocks:   Low back/buttock pain  Neck/shoulder pain  Certain types of headaches  Preparation for the injection:  1. Do not eat any solid food or dairy products within 8 hours of your appointment. 2. You may  drink clear liquid up to 3 hours before appointment.  Clear liquids include water, black coffee, juice or soda.  No milk or cream please. 3. You may take your regular medication, including pain medications, with a sip of water before your appointment.  Diabetics should hold regular insulin (if taken separately) and take 1/2 normal NPH dose the morning of the procedure.  Carry some sugar containing items with you to your appointment. 4. A driver must accompany you and be prepared to drive you home after your procedure. 5. Bring all your current medications with you. 6. An IV may be inserted and sedation may be given at the discretion of the physician. 7. A blood pressure  cuff, EKG and other monitors will often be applied during the procedure.  Some patients may need to have extra oxygen administered for a short period. 8. You will be asked to provide medical information, including your allergies and medications, prior to the procedure.  We must know immediately if you are taking blood thinners (like Coumadin/Warfarin) or if you are allergic to IV iodine contrast (dye).  We must know if you could possible be pregnant.  Possible side-effects:   Bleeding from needle site  Infection (rare, may require surgery)  Nerve injury (rare)  Numbness & tingling (temporary)  Difficulty urinating (rare, temporary)  Spinal headache (a headache worse with upright posture)  Light-headedness (temporary)  Pain at injection site (serveral days)  Decreased blood pressure (rare, temporary)  Weakness in arm/leg (temporary)  Pressure sensation in back/neck (temporary)   Call if you experience:   Fever/chills associated with headache or increased back/neck pain  Headache worsened by an upright position  New onset, weakness or numbness of an extremity below the injection site  Hives or difficulty breathing (go to the emergency room)  Inflammation or drainage at the injection site(s)  Severe  back/neck pain greater than usual  New symptoms which are concerning to you  Please note:  Although the local anesthetic injected can often make your back or neck feel good for several hours after the injection, the pain will likely return. It takes 3-7 days for steroids to work.  You may not notice any pain relief for at least one week.  If effective, we will often do a series of 2-3 injections spaced 3-6 weeks apart to maximally decrease your pain.  After the initial series, you may be a candidate for a more permanent nerve block of the facets.  If you have any questions, please call #[phone] [facility]

## 2017-02-18 NOTE — Patient Instructions (Addendum)
Preparing for Procedure with Sedation Instructions: . Oral Intake: Do not eat or drink anything for at least 8 hours prior to your procedure. . Transportation: Public transportation is not allowed. Bring an adult driver. The driver must be physically present in our waiting room before any procedure can be started. [facility] Kitchen Physical Assistance: Bring an adult physically capable of assisting you, in the event you need help. This adult should keep you company at home for at least 6 hours after the procedure. . Blood Pressure Medicine: Take your blood pressure medicine with a sip of water the morning of the procedure. . Blood thinners:  . Diabetics on insulin: Notify the staff so that you can be scheduled 1st case in the morning. If your diabetes requires high dose insulin, take only  of your normal insulin dose the morning of the procedure and notify the staff that you have done so. . Preventing infections: Shower with an antibacterial soap the morning of your procedure. . Build-up your immune system: Take 1000 mg of Vitamin C with every meal (3 times a day) the day prior to your procedure. Marland Kitchen Antibiotics: Inform the staff if you have a condition or reason that requires you to take antibiotics before dental procedures. . Pregnancy: If you are pregnant, call and cancel the procedure. . Sickness: If you have a cold, fever, or any active infections, call and cancel the procedure. . Arrival: You must be in the facility at least 30 minutes prior to your scheduled procedure. . Children: Do not bring children with you. . Dress appropriately: Bring dark clothing that you would not mind if they get stained. . Valuables: Do not bring any jewelry or valuables. Procedure appointments are reserved for interventional treatments only. Marland Kitchen No Prescription Refills. . No medication changes will be discussed during procedure appointments. . No disability issues will be  discussed.  ____________________________________________________________________________________________  Radiofrequency Lesioning Radiofrequency lesioning is a procedure that is performed to relieve pain. The procedure is often used for back, neck, or arm pain. Radiofrequency lesioning involves the use of a machine that creates radio waves to make heat. During the procedure, the heat is applied to the nerve that carries the pain signal. The heat damages the nerve and interferes with the pain signal. Pain relief usually starts about 2 weeks after the procedure and lasts for 6 months to 1 year. Tell a health care provider about:  Any allergies you have.  All medicines you are taking, including vitamins, herbs, eye drops, creams, and over-the-counter medicines.  Any problems you or family members have had with anesthetic medicines.  Any blood disorders you have.  Any surgeries you have had.  Any medical conditions you have.  Whether you are pregnant or may be pregnant. What are the risks? Generally, this is a safe procedure. However, problems may occur, including:  Pain or soreness at the injection site.  Infection at the injection site.  Damage to nerves or blood vessels. What happens before the procedure?  Ask your health care provider about:  Changing or stopping your regular medicines. This is especially important if you are taking diabetes medicines or blood thinners.  Taking medicines such as aspirin and ibuprofen. These medicines can thin your blood. Do not take these medicines before your procedure if your health care provider instructs you not to.  Follow instructions from your health care provider about eating or drinking restrictions.  Plan to have someone take you home after the procedure.  If you go home right after  the procedure, plan to have someone with you for 24 hours. What happens during the procedure?  You will be given one or more of the following:  A  medicine to help you relax (sedative).  A medicine to numb the area (local anesthetic).  You will be awake during the procedure. You will need to be able to talk with the health care provider during the procedure.  With the help of a type of X-ray (fluoroscopy), the health care provider will insert a radiofrequency needle into the area to be treated.  Next, a wire that carries the radio waves (electrode) will be put through the radiofrequency needle. An electrical pulse will be sent through the electrode to verify the correct nerve. You will feel a tingling sensation, and you may have muscle twitching.  Then, the tissue that is around the needle tip will be heated by an electric current that is passed using the radiofrequency machine. This will numb the nerves.  A bandage (dressing) will be put on the insertion area after the procedure is done. The procedure may vary among health care providers and hospitals. What happens after the procedure?  Your blood pressure, heart rate, breathing rate, and blood oxygen level will be monitored often until the medicines you were given have worn off.  Return to your normal activities as directed by your health care provider. This information is not intended to replace advice given to you by your health care provider. Make sure you discuss any questions you have with your health care provider. Document Released: 07/16/2011 Document Revised: 04/24/2016 Document Reviewed: 12/25/2014 Elsevier Interactive Patient Education  2017 [employer] Facet Blocks Patient Information  Description: The facets are joints in the spine between the vertebrae.  Like any joints in the body, facets can become irritated and painful.  Arthritis can also effect the facets.  By injecting steroids and local anesthetic in and around these joints, we can temporarily block the nerve supply to them.  Steroids act directly on irritated nerves and tissues to reduce selling and  inflammation which often leads to decreased pain.  Facet blocks may be done anywhere along the spine from the neck to the low back depending upon the location of your pain.   After numbing the skin with local anesthetic (like Novocaine), a small needle is passed onto the facet joints under x-ray guidance.  You may experience a sensation of pressure while this is being done.  The entire block usually lasts about 15-25 minutes.   Conditions which may be treated by facet blocks:   Low back/buttock pain  Neck/shoulder pain  Certain types of headaches  Preparation for the injection:  1. Do not eat any solid food or dairy products within 8 hours of your appointment. 2. You may drink clear liquid up to 3 hours before appointment.  Clear liquids include water, black coffee, juice or soda.  No milk or cream please. 3. You may take your regular medication, including pain medications, with a sip of water before your appointment.  Diabetics should hold regular insulin (if taken separately) and take 1/2 normal NPH dose the morning of the procedure.  Carry some sugar containing items with you to your appointment. 4. A driver must accompany you and be prepared to drive you home after your procedure. 5. Bring all your current medications with you. 6. An IV may be inserted and sedation may be given at the discretion of the physician. 7. A blood pressure cuff, EKG and other monitors  will often be applied during the procedure.  Some patients may need to have extra oxygen administered for a short period. 8. You will be asked to provide medical information, including your allergies and medications, prior to the procedure.  We must know immediately if you are taking blood thinners (like Coumadin/Warfarin) or if you are allergic to IV iodine contrast (dye).  We must know if you could possible be pregnant.  Possible side-effects:   Bleeding from needle site  Infection (rare, may require surgery)  Nerve injury  (rare)  Numbness & tingling (temporary)  Difficulty urinating (rare, temporary)  Spinal headache (a headache worse with upright posture)  Light-headedness (temporary)  Pain at injection site (serveral days)  Decreased blood pressure (rare, temporary)  Weakness in arm/leg (temporary)  Pressure sensation in back/neck (temporary)   Call if you experience:   Fever/chills associated with headache or increased back/neck pain  Headache worsened by an upright position  New onset, weakness or numbness of an extremity below the injection site  Hives or difficulty breathing (go to the emergency room)  Inflammation or drainage at the injection site(s)  Severe back/neck pain greater than usual  New symptoms which are concerning to you  Please note:  Although the local anesthetic injected can often make your back or neck feel good for several hours after the injection, the pain will likely return. It takes 3-7 days for steroids to work.  You may not notice any pain relief for at least one week.  If effective, we will often do a series of 2-3 injections spaced 3-6 weeks apart to maximally decrease your pain.  After the initial series, you may be a candidate for a more permanent nerve block of the facets.  If you have any questions, please call #[phone] [facility]

## 2017-02-18 NOTE — Progress Notes (Signed)
Safety precautions to be maintained throughout the outpatient stay will include: orient to surroundings, keep bed in low position, maintain call bell within reach at all times, provide assistance with transfer out of bed and ambulation.  

## 2017-03-05 ENCOUNTER — Ambulatory Visit: Payer: Medicare Other | Attending: Pain Medicine | Admitting: Pain Medicine

## 2017-03-05 ENCOUNTER — Encounter: Payer: Self-pay | Admitting: Pain Medicine

## 2017-03-05 VITALS — BP 119/46 | HR 66 | Temp 97.7°F | Resp 16 | Ht 63.0 in | Wt 230.0 lb

## 2017-03-05 DIAGNOSIS — Z5181 Encounter for therapeutic drug level monitoring: Secondary | ICD-10-CM | POA: Diagnosis present

## 2017-03-05 DIAGNOSIS — Z90710 Acquired absence of both cervix and uterus: Secondary | ICD-10-CM | POA: Insufficient documentation

## 2017-03-05 DIAGNOSIS — I5032 Chronic diastolic (congestive) heart failure: Secondary | ICD-10-CM | POA: Diagnosis not present

## 2017-03-05 DIAGNOSIS — M25512 Pain in left shoulder: Secondary | ICD-10-CM

## 2017-03-05 DIAGNOSIS — Z794 Long term (current) use of insulin: Secondary | ICD-10-CM | POA: Diagnosis not present

## 2017-03-05 DIAGNOSIS — N183 Chronic kidney disease, stage 3 (moderate): Secondary | ICD-10-CM | POA: Diagnosis not present

## 2017-03-05 DIAGNOSIS — F329 Major depressive disorder, single episode, unspecified: Secondary | ICD-10-CM | POA: Insufficient documentation

## 2017-03-05 DIAGNOSIS — M25511 Pain in right shoulder: Secondary | ICD-10-CM | POA: Diagnosis not present

## 2017-03-05 DIAGNOSIS — M47812 Spondylosis without myelopathy or radiculopathy, cervical region: Secondary | ICD-10-CM | POA: Insufficient documentation

## 2017-03-05 DIAGNOSIS — Z79899 Other long term (current) drug therapy: Secondary | ICD-10-CM | POA: Diagnosis not present

## 2017-03-05 DIAGNOSIS — M797 Fibromyalgia: Secondary | ICD-10-CM | POA: Diagnosis not present

## 2017-03-05 DIAGNOSIS — I130 Hypertensive heart and chronic kidney disease with heart failure and stage 1 through stage 4 chronic kidney disease, or unspecified chronic kidney disease: Secondary | ICD-10-CM | POA: Insufficient documentation

## 2017-03-05 DIAGNOSIS — M545 Low back pain, unspecified: Secondary | ICD-10-CM

## 2017-03-05 DIAGNOSIS — F1190 Opioid use, unspecified, uncomplicated: Secondary | ICD-10-CM

## 2017-03-05 DIAGNOSIS — Z9889 Other specified postprocedural states: Secondary | ICD-10-CM | POA: Diagnosis not present

## 2017-03-05 DIAGNOSIS — M549 Dorsalgia, unspecified: Secondary | ICD-10-CM | POA: Diagnosis not present

## 2017-03-05 DIAGNOSIS — E1122 Type 2 diabetes mellitus with diabetic chronic kidney disease: Secondary | ICD-10-CM | POA: Insufficient documentation

## 2017-03-05 DIAGNOSIS — E1142 Type 2 diabetes mellitus with diabetic polyneuropathy: Secondary | ICD-10-CM | POA: Insufficient documentation

## 2017-03-05 DIAGNOSIS — Z8249 Family history of ischemic heart disease and other diseases of the circulatory system: Secondary | ICD-10-CM | POA: Insufficient documentation

## 2017-03-05 DIAGNOSIS — G894 Chronic pain syndrome: Secondary | ICD-10-CM | POA: Insufficient documentation

## 2017-03-05 DIAGNOSIS — K219 Gastro-esophageal reflux disease without esophagitis: Secondary | ICD-10-CM | POA: Diagnosis not present

## 2017-03-05 DIAGNOSIS — M791 Myalgia: Secondary | ICD-10-CM

## 2017-03-05 DIAGNOSIS — Z79891 Long term (current) use of opiate analgesic: Secondary | ICD-10-CM | POA: Diagnosis not present

## 2017-03-05 DIAGNOSIS — M542 Cervicalgia: Secondary | ICD-10-CM | POA: Diagnosis not present

## 2017-03-05 DIAGNOSIS — Z833 Family history of diabetes mellitus: Secondary | ICD-10-CM | POA: Diagnosis not present

## 2017-03-05 DIAGNOSIS — Z6841 Body Mass Index (BMI) 40.0 and over, adult: Secondary | ICD-10-CM | POA: Diagnosis not present

## 2017-03-05 DIAGNOSIS — G8929 Other chronic pain: Secondary | ICD-10-CM

## 2017-03-05 DIAGNOSIS — J449 Chronic obstructive pulmonary disease, unspecified: Secondary | ICD-10-CM | POA: Insufficient documentation

## 2017-03-05 DIAGNOSIS — Z87891 Personal history of nicotine dependence: Secondary | ICD-10-CM | POA: Diagnosis not present

## 2017-03-05 DIAGNOSIS — M7918 Myalgia, other site: Secondary | ICD-10-CM

## 2017-03-05 MED ORDER — OXYCODONE HCL 5 MG PO TABS: 5.0000 mg | ORAL_TABLET | Freq: Three times a day (TID) | ORAL | 0 refills | Status: DC | PRN

## 2017-03-05 MED ORDER — PREGABALIN 150 MG PO CAPS: 150.0000 mg | ORAL_CAPSULE | Freq: Two times a day (BID) | ORAL | 2 refills | Status: DC

## 2017-03-05 MED ORDER — TIZANIDINE HCL 4 MG PO TABS: 4.0000 mg | ORAL_TABLET | Freq: Two times a day (BID) | ORAL | 2 refills | Status: DC | PRN

## 2017-03-05 NOTE — Patient Instructions (Signed)

## 2017-03-05 NOTE — Progress Notes (Signed)
Nursing Pain Medication Assessment:  Safety precautions to be maintained throughout the outpatient stay will include: orient to surroundings, keep bed in low position, maintain call bell within reach at all times, provide assistance with transfer out of bed and ambulation.  Medication Inspection Compliance: Pill count conducted under aseptic conditions, in front of the patient. Neither the pills nor the bottle was removed from the patient's sight at any time. Once count was completed pills were immediately returned to the patient in their original bottle.  Medication: Oxycodone IR Pill/Patch Count: 0 of 90 pills remain Pill/Patch Appearance: Markings consistent with prescribed medication Bottle Appearance: Standard pharmacy container. Clearly labeled. Filled Date: 03 / 04 / 2018 Last Medication intake:  Yesterday

## 2017-03-05 NOTE — Progress Notes (Signed)
Patient's Name: Amber Acosta  MRN: [id number]  Referring Provider: Sharyne Peach, MD  DOB: 1951/02/11  PCP: Sharyne Peach, MD  DOS: 03/05/2017  Note by: Kathlen Brunswick. Dossie Arbour, MD  Service setting: Ambulatory outpatient  Specialty: Interventional Pain Management  Location: [facility] (AMB) Pain Management Facility    Patient type: Established   Primary Reason(s) for Visit: Encounter for prescription drug management (Level of risk: moderate) CC: Back Pain (lower and upper)  HPI  Amber Acosta is a 66 y.o. year old, female patient, who comes today for a medication management evaluation. She has Cervical vertebral fusion (Uncomplicated C5 through C7 ACDF.); Fibromyalgia; Lumbar facet joint pain; Lumbar facet syndrome (Bilateral) (R>L); Chronic kidney disease; COPD (chronic obstructive pulmonary disease) ([place]); Depression; Type 2 diabetes mellitus ([place]); Hypertensive heart disease with congestive heart failure ([place]); Gall bladder polyp; GERD (gastroesophageal reflux disease); Hyperlipemia; Inflammatory polyarthropathy ([place]); Sleep terror; Obesity, Class II, BMI 35-39.9, with comorbidity; OP (osteoporosis); Psoriasis; Kidney cysts; Muscle weakness (generalized); Sarcoidosis ([place]); Thrombocytopenia ([place]); Vitamin D deficiency; Chronic neck pain (Location of Secondary source of pain) (Bilateral) (L>R); Cervicogenic headache (Left); Cervical facet syndrome (Left); Chronic shoulder pain (Location of Tertiary source of pain) (Bilateral) (L>R); At risk for falls; Balance problems; History of closed head injury; Opiate use (22.5 MME/Day); Long term current use of opiate analgesic; Encounter for therapeutic drug level monitoring; Long term prescription opiate use; Spondylolisthesis of cervical region; 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[place]); Hx of cervical spine surgery; Low magnesium levels; Myofascial pain; Neuropathy ([place]); Benign paroxysmal positional vertigo; Diabetic peripheral neuropathy ([place]); Meralgia paresthetica (Right);  Cervical spondylosis; Chronic diastolic congestive heart failure ([place]); Mixed stress and urge urinary incontinence; Overactive detrusor; 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 Her primarily concern today is the Back Pain (lower and upper)  Pain Assessment: Self-Reported Pain Score: 0-No pain/10             Reported level is compatible with observation.       Pain Type: Chronic pain Pain Location: Back Pain Orientation: Lower, Upper Pain Descriptors / Indicators: Aching, Constant, Throbbing Pain Frequency: Constant  Amber Acosta was last scheduled for an appointment on 02/18/2017 for medication management. During today's appointment we reviewed Amber Acosta's chronic pain status, as well as her outpatient medication regimen.  The patient  reports that she does not use drugs. Her body mass index is 40.74 kg/m.  Further details on both, my assessment(s), as well as the proposed treatment plan, please see below.  Controlled Substance Pharmacotherapy Assessment REMS (Risk Evaluation and Mitigation Strategy)  Analgesic:Oxycodone IR 5 mg every 8 hours (15 mg/day) MME/day:22.5 mg/day Evon Slack, RN  03/05/2017  8:38 AM  Sign at close encounter Nursing Pain Medication Assessment:  Safety precautions to be maintained throughout the outpatient stay will include: orient to surroundings, keep bed in low position, maintain call bell within reach at all times, provide assistance with transfer out of bed and ambulation.  Medication Inspection  Compliance: Pill count conducted under aseptic conditions, in front of the patient. Neither the pills nor the bottle was removed from the patient's sight at any time. Once count was completed pills were immediately  returned to the patient in their original bottle.  Medication: Oxycodone IR Pill/Patch Count: 0 of 90 pills remain Pill/Patch Appearance: Markings consistent with prescribed medication Bottle Appearance: Standard pharmacy container. Clearly labeled. Filled Date: 03 / 04 / 2018 Last Medication intake:  Yesterday   Pharmacokinetics: Liberation and absorption (onset of action): WNL Distribution (time to peak effect): WNL Metabolism and excretion (duration of action): WNL         Pharmacodynamics: Desired effects: Analgesia: [name] reports >50% benefit. Functional ability: Patient reports that medication allows her to accomplish basic ADLs Clinically meaningful improvement in function (CMIF): Sustained CMIF goals met Perceived effectiveness: Described as relatively effective, allowing for increase in activities of daily living (ADL) Undesirable effects: Side-effects or Adverse reactions: None reported Monitoring: [place] PMP: Online review of the past 76-monthperiod conducted. Compliant with practice rules and regulations List of all UDS test(s) done:  Lab Results  Component Value Date   TOXASSSELUR FINAL 12/16/2016   [place] 03/26/2016   [place] 12/19/2015   Last UDS on record: ToxAssure Select 13  Date Value Ref Range Status  12/16/2016 FINAL  Final    Comment:    ==================================================================== TOXASSURE SELECT 13 (MW) ==================================================================== Test                             Result       Flag       Units Drug Present and Declared for Prescription Verification   Oxycodone                      848          EXPECTED   ng/mg creat   Noroxycodone                   1010          EXPECTED   ng/mg creat    Sources of oxycodone include scheduled prescription medications.    Noroxycodone is an expected metabolite of oxycodone. ==================================================================== Test                      Result    Flag   Units      Ref Range   Creatinine              98               mg/dL      >=20 ==================================================================== Declared Medications:  The flagging and interpretation on this report are based on the  following declared medications.  Unexpected results may arise from  inaccuracies in the declared medications.  **Note: The testing scope of this panel includes these medications:  Oxycodone  **Note: The testing scope of this panel does not include following  reported medications:  Albuterol  Amlodipine (Norvasc)  Aspirin (Aspirin 81)  Budenoside (Symbicort)  Fluoxetine (Prozac)  Formoterol (Symbicort)  Furosemide (Lasix)  Hydralazine (Apresoline)  Insulin (Humulin)  Losartan (Cozaar)  Lovastatin (Mevacor)  Magnesium (Mag-Ox)  Metoprolol (Lopressor)  Metoprolol (Toprol)  Nitroglycerin  Potassium (Klor-Con)  Pregabalin (Lyrica)  Tizanidine  Trazodone  Vitamin D2 (Drisdol) ==================================================================== For clinical consultation, please call [phone]====    UDS interpretation: Compliant          Medication Assessment Form: Reviewed. Patient indicates being compliant with therapy Treatment compliance: Compliant Risk Assessment Profile: Aberrant behavior: See prior evaluations. None observed or detected today Comorbid factors increasing  risk of overdose: See prior notes. No additional risks detected today Risk of substance use disorder (SUD): Low Opioid Risk Tool (ORT) Total Score:    Interpretation Table:  Score <3 = Low Risk for SUD  Score between 4-7 = Moderate Risk  for SUD  Score >8 = High Risk for Opioid Abuse   Risk Mitigation Strategies:  Patient Counseling: Covered Patient-Prescriber Agreement (PPA): Present and active  Notification to other healthcare providers: Done  Pharmacologic Plan: No change in therapy, at this time  Laboratory Chemistry  Inflammation Markers Lab Results  Component Value Date   CRP 2.1 (H) 03/26/2016   ESRSEDRATE 52 (H) 03/26/2016   (CRP: Acute Phase) (ESR: Chronic Phase) Renal Function Markers Lab Results  Component Value Date   BUN 14 03/26/2016   CREATININE 1.28 (H) 03/26/2016   GFRAA 50 (L) 03/26/2016   GFRNONAA 44 (L) 03/26/2016   Hepatic Function Markers Lab Results  Component Value Date   AST 24 03/26/2016   ALT 20 03/26/2016   ALBUMIN 4.0 03/26/2016   ALKPHOS 70 03/26/2016   Electrolytes Lab Results  Component Value Date   NA 136 03/26/2016   K 3.8 03/26/2016   CL 100 (L) 03/26/2016   CALCIUM 8.5 (L) 03/26/2016   MG 1.8 03/26/2016   Neuropathy Markers Lab Results  Component Value Date   VITAMINB12 467 03/26/2016   Bone Pathology Markers Lab Results  Component Value Date   ALKPHOS 70 03/26/2016   VD25OH 20.2 (L) 03/26/2016   CALCIUM 8.5 (L) 03/26/2016   Coagulation Parameters Lab Results  Component Value Date   PLT 146 (L) 03/26/2016   Cardiovascular Markers No results found for: BNP, HGB, HCT Note: Lab results reviewed.  Recent Diagnostic Imaging Review  Dg C-arm 1-60 Min-no Report  Result Date: 01/26/2017 Fluoroscopy was utilized by the requesting physician.  No radiographic interpretation.   Note: Imaging results reviewed.          Meds  The patient has a current medication list which includes the following prescription(s): albuterol, amlodipine, b-d ins syr ultrafine 1cc/30g, b-d ins syr ultrafine 1cc/31g, budesonide-formoterol, famotidine, fluoxetine, furosemide, hydralazine, klor-con 10, losartan, lovastatin, metoprolol succinate, nitroglycerin, nystatin, oxycodone,  oxycodone, oxycodone, phentermine, pregabalin, optichamber diamond, tizanidine, trazodone, vitamin d (ergocalciferol), and insulin nph human.  Current Outpatient Prescriptions on File Prior to Visit  Medication Sig  . albuterol (PROVENTIL HFA;VENTOLIN HFA) 108 (90 Base) MCG/ACT inhaler Inhale 2 puffs into the lungs every 6 (six) hours as needed.   Marland Kitchen amLODipine (NORVASC) 10 MG tablet Take 10 mg by mouth daily.   . B-D INS SYR ULTRAFINE 1CC/30G 30G X 1/2" 1 ML MISC   . B-D INS SYR ULTRAFINE 1CC/31G 31G X 5/16" 1 ML MISC   . budesonide-formoterol (SYMBICORT) 160-4.5 MCG/ACT inhaler Inhale 2 puffs into the lungs 2 (two) times daily.   . famotidine (PEPCID) 20 MG tablet   . FLUoxetine (PROZAC) 40 MG capsule Take 40 mg by mouth daily.   . furosemide (LASIX) 40 MG tablet Take 40 mg by mouth daily.  . hydrALAZINE (APRESOLINE) 25 MG tablet Take 25 mg by mouth 2 (two) times daily.   Marland Kitchen KLOR-CON 10 10 MEQ tablet Take 10 mEq by mouth daily.   Marland Kitchen losartan (COZAAR) 100 MG tablet TAKE 1 TABLET (100 MG TOTAL) BY MOUTH ONCE DAILY.  Marland Kitchen lovastatin (MEVACOR) 10 MG tablet Take 10 mg by mouth at bedtime.  . metoprolol succinate (TOPROL-XL) 50 MG 24 hr tablet Take 25 mg by mouth daily.   Marland Kitchen  nitroGLYCERIN (NITROSTAT) 0.3 MG SL tablet 1 tablet sublingual prn for ESOPHAGEAL SPASMS(may repeat every 5 min, seek med help if pain persists after 3 tablets)  . nystatin (MYCOSTATIN) 100000 UNIT/ML suspension   . phentermine 15 MG capsule TAKE ONE CAPSULE BY MOUTH EVERY AM BEFORE BREAKFAST  . Spacer/Aero-Holding Chambers ([place]) MISC See admin instructions.  . traZODone (DESYREL) 100 MG tablet Take 200 mg by mouth at bedtime.   . Vitamin D, Ergocalciferol, (DRISDOL) 50000 units CAPS capsule Take 50,000 Units by mouth every 30 (thirty) days.   . insulin NPH Human (HUMULIN N,NOVOLIN N) 100 UNIT/ML injection NPH inject SQ bid 60 units   No current facility-administered medications on file prior to visit.    ROS   Constitutional: Denies any fever or chills Gastrointestinal: No reported hemesis, hematochezia, vomiting, or acute GI distress Musculoskeletal: Denies any acute onset joint swelling, redness, loss of ROM, or weakness Neurological: No reported episodes of acute onset apraxia, aphasia, dysarthria, agnosia, amnesia, paralysis, loss of coordination, or loss of consciousness  Allergies  Ms. Martos is allergic to enbrel [etanercept]; metformin; methotrexate; mushroom extract complex; other; darvon [propoxyphene]; minocycline; and ciprofloxacin.  PFSH  Drug: Ms. Ritsema  reports that she does not use drugs. Alcohol:  reports that she does not drink alcohol. Tobacco:  reports that she has quit smoking. She has quit using smokeless tobacco. Medical:  has a past medical history of Arthritis; Cervical spondylosis without myelopathy (09/21/2015); Chronic pain; Chronic pain associated with significant psychosocial dysfunction (08/28/2013); Depression; Diabetes mellitus without complication ([place]); Displacement of cervical intervertebral disc without myelopathy (09/05/2015); Fibromyalgia; GERD (gastroesophageal reflux disease); Hypertension; Kidney disease, chronic, stage III (GFR 30-59 ml/min); Sarcoid ([place]); and Thrombopenia ([place]). Family: family history includes Alcohol abuse in her brother; Alzheimer's disease in her mother; COPD in her father; Diabetes in her father; Heart disease in her father.  Past Surgical History:  Procedure Laterality Date  . ABDOMINAL HYSTERECTOMY    . BACK SURGERY    . BREAST SURGERY    . FRACTURE SURGERY     nose  . HAND SURGERY    . KNEE ARTHROSCOPY     right  . neck fusion     Constitutional Exam  General appearance: Well nourished, well developed, and well hydrated. In no apparent acute distress Vitals:   03/05/17 0827  BP: (!) 119/46  Pulse: 66  Resp: 16  Temp: 97.7 F (36.5 C)  TempSrc: Oral  SpO2: 95%  Weight: 230 lb (104.3 kg)  Height: '5\' 3"'$  (1.6 m)   BMI  Assessment: Estimated body mass index is 40.74 kg/m as calculated from the following:   Height as of this encounter: '5\' 3"'$  (1.6 m).   Weight as of this encounter: 230 lb (104.3 kg).  BMI interpretation table: BMI level Category Range association with higher incidence of chronic pain  <18 kg/m2 Underweight   18.5-24.9 kg/m2 Ideal body weight   25-29.9 kg/m2 Overweight Increased incidence by 20%  30-34.9 kg/m2 Obese (Class I) Increased incidence by 68%  35-39.9 kg/m2 Severe obesity (Class II) Increased incidence by 136%  >40 kg/m2 Extreme obesity (Class III) Increased incidence by 254%   BMI Readings from Last 4 Encounters:  03/05/17 40.74 kg/m  02/18/17 40.74 kg/m  01/26/17 40.74 kg/m  01/20/17 40.74 kg/m   Wt Readings from Last 4 Encounters:  03/05/17 230 lb (104.3 kg)  02/18/17 230 lb (104.3 kg)  01/26/17 230 lb (104.3 kg)  01/20/17 230 lb (104.3 kg)  Psych/Mental status: Alert, oriented  x 3 (person, place, & time)       Eyes: PERLA Respiratory: No evidence of acute respiratory distress  Cervical Spine Exam  Inspection: No masses, redness, or swelling Alignment: Symmetrical Functional ROM: Unrestricted ROM Stability: No instability detected Muscle strength & Tone: Functionally intact Sensory: Unimpaired Palpation: No palpable anomalies  Upper Extremity (UE) Exam    Side: Right upper extremity  Side: Left upper extremity  Inspection: No masses, redness, swelling, or asymmetry. No contractures  Inspection: No masses, redness, swelling, or asymmetry. No contractures  Functional ROM: Unrestricted ROM          Functional ROM: Unrestricted ROM          Muscle strength & Tone: Functionally intact  Muscle strength & Tone: Functionally intact  Sensory: Unimpaired  Sensory: Unimpaired  Palpation: No palpable anomalies  Palpation: No palpable anomalies  Specialized Test(s): Deferred         Specialized Test(s): Deferred          Thoracic Spine Exam  Inspection: No masses,  redness, or swelling Alignment: Symmetrical Functional ROM: Unrestricted ROM Stability: No instability detected Sensory: Unimpaired Muscle strength & Tone: No palpable anomalies  Lumbar Spine Exam  Inspection: No masses, redness, or swelling Alignment: Symmetrical Functional ROM: Unrestricted ROM Stability: No instability detected Muscle strength & Tone: Functionally intact Sensory: Unimpaired Palpation: No palpable anomalies Provocative Tests: Lumbar Hyperextension and rotation test: evaluation deferred today       Patrick's Maneuver: evaluation deferred today              Gait & Posture Assessment  Ambulation: Unassisted Gait: Relatively normal for age and body habitus Posture: WNL   Lower Extremity Exam    Side: Right lower extremity  Side: Left lower extremity  Inspection: No masses, redness, swelling, or asymmetry. No contractures  Inspection: No masses, redness, swelling, or asymmetry. No contractures  Functional ROM: Unrestricted ROM          Functional ROM: Unrestricted ROM          Muscle strength & Tone: Functionally intact  Muscle strength & Tone: Functionally intact  Sensory: Unimpaired  Sensory: Unimpaired  Palpation: No palpable anomalies  Palpation: No palpable anomalies   Assessment  Primary Diagnosis & Pertinent Problem List: The primary encounter diagnosis was Chronic low back pain (Location of Primary Source of Pain) (Bilateral) (R>L). Diagnoses of Chronic neck pain (Location of Secondary source of pain) (Bilateral) (L>R), Chronic pain of both shoulders, Chronic pain syndrome, Fibromyalgia, Myofascial pain, Long term current use of opiate analgesic, and Opiate use (22.5 MME/Day) were also pertinent to this visit.  Status Diagnosis  Controlled Controlled Controlled 1. Chronic low back pain (Location of Primary Source of Pain) (Bilateral) (R>L)   2. Chronic neck pain (Location of Secondary source of pain) (Bilateral) (L>R)   3. Chronic pain of both shoulders    4. Chronic pain syndrome   5. Fibromyalgia   6. Myofascial pain   7. Long term current use of opiate analgesic   8. Opiate use (22.5 MME/Day)      Plan of Care  Pharmacotherapy (Medications Ordered): Meds ordered this encounter  Medications  . oxyCODONE (OXY IR/ROXICODONE) 5 MG immediate release tablet    Sig: Take 1 tablet (5 mg total) by mouth every 8 (eight) hours as needed for moderate pain or severe pain.    Dispense:  90 tablet    Refill:  0    Do not place this medication, or any other prescription from  our practice, on "Automatic Refill". Patient may have prescription filled one day early if pharmacy is closed on scheduled refill date. Do not fill until: 04/25/17 To last until: 05/25/17  . oxyCODONE (OXY IR/ROXICODONE) 5 MG immediate release tablet    Sig: Take 1 tablet (5 mg total) by mouth every 8 (eight) hours as needed for moderate pain or severe pain.    Dispense:  90 tablet    Refill:  0    Do not place this medication, or any other prescription from our practice, on "Automatic Refill". Patient may have prescription filled one day early if pharmacy is closed on scheduled refill date. Do not fill until: 05/25/17 To last until: 06/24/17  . oxyCODONE (OXY IR/ROXICODONE) 5 MG immediate release tablet    Sig: Take 1 tablet (5 mg total) by mouth every 8 (eight) hours as needed for moderate pain or severe pain.    Dispense:  90 tablet    Refill:  0    Do not place this medication, or any other prescription from our practice, on "Automatic Refill". Patient may have prescription filled one day early if pharmacy is closed on scheduled refill date. Do not fill until: 03/26/17 To last until: 04/25/17  . pregabalin (LYRICA) 150 MG capsule    Sig: Take 1 capsule (150 mg total) by mouth 2 (two) times daily.    Dispense:  60 capsule    Refill:  2    Do not place this medication, or any other prescription from our practice, on "Automatic Refill". Patient may have prescription  filled one day early if pharmacy is closed on scheduled refill date.  Marland Kitchen tiZANidine (ZANAFLEX) 4 MG tablet    Sig: Take 1 tablet (4 mg total) by mouth 2 (two) times daily as needed for muscle spasms.    Dispense:  60 tablet    Refill:  2    Do not place this medication, or any other prescription from our practice, on "Automatic Refill". Patient may have prescription filled one day early if pharmacy is closed on scheduled refill date.   New Prescriptions   No medications on file   Medications administered today: Ms. Schnoebelen had no medications administered during this visit. Lab-work, procedure(s), and/or referral(s): No orders of the defined types were placed in this encounter.  Imaging and/or referral(s): None  Interventional therapies: Planned, scheduled, and/or pending:   Left Cervical Facet RFA under fluoro and IV sedation   Considering:   Palliativeleft sided cervical facet block #3 Diagnostic/palliative bilateral lumbar facet block Possible bilateral lumbar facet radiofrequency ablation Diagnostic/palliative left cervical facet block #2 Possible left-sided cervical facet radiofrequency ablation   Palliative PRN treatment(s):   Diagnostic/palliative bilateral lumbar facet block Diagnostic/palliative left cervical facet block #2 Palliativeleft sided cervical facet block #3   Provider-requested follow-up: Return in about 3 months (around 06/04/2017) for (Nurse Practitioner) Med-Mgmt, in addition, Procedure, (PRN) procedure.  Future Appointments Date Time Provider [place]  06/01/2017 8:15 AM Vevelyn Francois, NP [facility] None  06/22/2017 10:45 AM Milinda Pointer, MD [facility] None   Primary Care Physician: Sharyne Peach, MD Location: [facility] Note by: Kathlen Brunswick. Dossie Arbour, M.D, DABA, DABAPM, DABPM, DABIPP, FIPP Date: 03/05/2017; Time: 10:33 AM  Pain Score Disclaimer: We use the NRS-11 scale. This is a self-reported, subjective  measurement of pain severity with only modest accuracy. It is used primarily to identify changes within a particular patient. It must be understood that outpatient pain scales are significantly less accurate  that those used for research, where they can be applied under ideal controlled circumstances with minimal exposure to variables. In reality, the score is likely to be a combination of pain intensity and pain affect, where pain affect describes the degree of emotional arousal or changes in action readiness caused by the sensory experience of pain. Factors such as social and work situation, setting, emotional state, anxiety levels, expectation, and prior pain experience may influence pain perception and show large inter-individual differences that may also be affected by time variables.  Patient instructions provided during this appointment: Patient Instructions  Pain Management Discharge Instructions  General Discharge Instructions :  If you need to reach your doctor call: Monday-Friday 8:00 am - 4:00 pm at [phone] or toll free [phone].  After clinic hours [phone] to have operator reach doctor.  Bring all of your medication bottles to all your appointments in the pain clinic.  To cancel or reschedule your appointment with Pain Management please remember to call 24 hours in advance to avoid a fee.  Refer to the educational materials which you have been given on: General Risks, I had my Procedure. Discharge Instructions, Post Sedation.  Post Procedure Instructions:  The drugs you were given will stay in your system until tomorrow, so for the next 24 hours you should not drive, make any legal decisions or drink any alcoholic beverages.  You may eat anything you prefer, but it is better to start with liquids then soups and crackers, and gradually work up to solid foods.  Please notify your doctor immediately if you have any unusual bleeding, trouble breathing or pain that is not  related to your normal pain.  Depending on the type of procedure that was done, some parts of your body may feel week and/or numb.  This usually clears up by tonight or the next day.  Walk with the use of an assistive device or accompanied by an adult for the 24 hours.  You may use ice on the affected area for the first 24 hours.  Put ice in a Ziploc bag and cover with a towel and place against area 15 minutes on 15 minutes off.  You may switch to heat after 24 hours.
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
## 2017-05-31 ENCOUNTER — Other Ambulatory Visit: Payer: Self-pay | Admitting: Pain Medicine

## 2017-05-31 DIAGNOSIS — M7918 Myalgia, other site: Secondary | ICD-10-CM

## 2017-06-01 ENCOUNTER — Ambulatory Visit: Payer: Medicare Other | Attending: Nurse Practitioner | Admitting: Nurse Practitioner

## 2017-06-01 ENCOUNTER — Encounter: Payer: Self-pay | Admitting: Nurse Practitioner

## 2017-06-01 VITALS — BP 117/56 | HR 60 | Temp 98.4°F | Resp 18 | Ht 63.0 in | Wt 225.0 lb

## 2017-06-01 DIAGNOSIS — G894 Chronic pain syndrome: Secondary | ICD-10-CM | POA: Diagnosis not present

## 2017-06-01 DIAGNOSIS — Z825 Family history of asthma and other chronic lower respiratory diseases: Secondary | ICD-10-CM | POA: Insufficient documentation

## 2017-06-01 DIAGNOSIS — M25511 Pain in right shoulder: Secondary | ICD-10-CM | POA: Diagnosis present

## 2017-06-01 DIAGNOSIS — G5711 Meralgia paresthetica, right lower limb: Secondary | ICD-10-CM | POA: Insufficient documentation

## 2017-06-01 DIAGNOSIS — M810 Age-related osteoporosis without current pathological fracture: Secondary | ICD-10-CM | POA: Insufficient documentation

## 2017-06-01 DIAGNOSIS — Z87891 Personal history of nicotine dependence: Secondary | ICD-10-CM | POA: Insufficient documentation

## 2017-06-01 DIAGNOSIS — R51 Headache: Secondary | ICD-10-CM

## 2017-06-01 DIAGNOSIS — M791 Myalgia: Secondary | ICD-10-CM | POA: Diagnosis not present

## 2017-06-01 DIAGNOSIS — M797 Fibromyalgia: Secondary | ICD-10-CM

## 2017-06-01 DIAGNOSIS — Z881 Allergy status to other antibiotic agents status: Secondary | ICD-10-CM | POA: Insufficient documentation

## 2017-06-01 DIAGNOSIS — I5032 Chronic diastolic (congestive) heart failure: Secondary | ICD-10-CM | POA: Insufficient documentation

## 2017-06-01 DIAGNOSIS — M47812 Spondylosis without myelopathy or radiculopathy, cervical region: Secondary | ICD-10-CM

## 2017-06-01 DIAGNOSIS — M488X2 Other specified spondylopathies, cervical region: Secondary | ICD-10-CM | POA: Insufficient documentation

## 2017-06-01 DIAGNOSIS — E1122 Type 2 diabetes mellitus with diabetic chronic kidney disease: Secondary | ICD-10-CM | POA: Insufficient documentation

## 2017-06-01 DIAGNOSIS — Z79899 Other long term (current) drug therapy: Secondary | ICD-10-CM | POA: Insufficient documentation

## 2017-06-01 DIAGNOSIS — H8110 Benign paroxysmal vertigo, unspecified ear: Secondary | ICD-10-CM | POA: Insufficient documentation

## 2017-06-01 DIAGNOSIS — D860 Sarcoidosis of lung: Secondary | ICD-10-CM | POA: Insufficient documentation

## 2017-06-01 DIAGNOSIS — E1142 Type 2 diabetes mellitus with diabetic polyneuropathy: Secondary | ICD-10-CM | POA: Insufficient documentation

## 2017-06-01 DIAGNOSIS — N183 Chronic kidney disease, stage 3 (moderate): Secondary | ICD-10-CM | POA: Insufficient documentation

## 2017-06-01 DIAGNOSIS — E559 Vitamin D deficiency, unspecified: Secondary | ICD-10-CM | POA: Insufficient documentation

## 2017-06-01 DIAGNOSIS — M25512 Pain in left shoulder: Secondary | ICD-10-CM | POA: Diagnosis not present

## 2017-06-01 DIAGNOSIS — M4692 Unspecified inflammatory spondylopathy, cervical region: Secondary | ICD-10-CM | POA: Diagnosis not present

## 2017-06-01 DIAGNOSIS — L409 Psoriasis, unspecified: Secondary | ICD-10-CM | POA: Insufficient documentation

## 2017-06-01 DIAGNOSIS — Z7951 Long term (current) use of inhaled steroids: Secondary | ICD-10-CM | POA: Diagnosis not present

## 2017-06-01 DIAGNOSIS — Z981 Arthrodesis status: Secondary | ICD-10-CM | POA: Insufficient documentation

## 2017-06-01 DIAGNOSIS — M7918 Myalgia, other site: Secondary | ICD-10-CM

## 2017-06-01 DIAGNOSIS — Z833 Family history of diabetes mellitus: Secondary | ICD-10-CM | POA: Diagnosis not present

## 2017-06-01 DIAGNOSIS — Z888 Allergy status to other drugs, medicaments and biological substances status: Secondary | ICD-10-CM | POA: Insufficient documentation

## 2017-06-01 DIAGNOSIS — E785 Hyperlipidemia, unspecified: Secondary | ICD-10-CM | POA: Insufficient documentation

## 2017-06-01 DIAGNOSIS — J449 Chronic obstructive pulmonary disease, unspecified: Secondary | ICD-10-CM | POA: Diagnosis not present

## 2017-06-01 DIAGNOSIS — I130 Hypertensive heart and chronic kidney disease with heart failure and stage 1 through stage 4 chronic kidney disease, or unspecified chronic kidney disease: Secondary | ICD-10-CM | POA: Insufficient documentation

## 2017-06-01 DIAGNOSIS — M4312 Spondylolisthesis, cervical region: Secondary | ICD-10-CM | POA: Insufficient documentation

## 2017-06-01 DIAGNOSIS — Z79891 Long term (current) use of opiate analgesic: Secondary | ICD-10-CM

## 2017-06-01 DIAGNOSIS — G4486 Cervicogenic headache: Secondary | ICD-10-CM

## 2017-06-01 DIAGNOSIS — K219 Gastro-esophageal reflux disease without esophagitis: Secondary | ICD-10-CM | POA: Diagnosis not present

## 2017-06-01 DIAGNOSIS — Z5181 Encounter for therapeutic drug level monitoring: Secondary | ICD-10-CM | POA: Diagnosis not present

## 2017-06-01 DIAGNOSIS — F3341 Major depressive disorder, recurrent, in partial remission: Secondary | ICD-10-CM | POA: Insufficient documentation

## 2017-06-01 DIAGNOSIS — Z811 Family history of alcohol abuse and dependence: Secondary | ICD-10-CM | POA: Diagnosis not present

## 2017-06-01 DIAGNOSIS — Z6839 Body mass index (BMI) 39.0-39.9, adult: Secondary | ICD-10-CM | POA: Insufficient documentation

## 2017-06-01 DIAGNOSIS — Z8249 Family history of ischemic heart disease and other diseases of the circulatory system: Secondary | ICD-10-CM | POA: Diagnosis not present

## 2017-06-01 DIAGNOSIS — N3281 Overactive bladder: Secondary | ICD-10-CM | POA: Insufficient documentation

## 2017-06-01 DIAGNOSIS — M542 Cervicalgia: Secondary | ICD-10-CM | POA: Insufficient documentation

## 2017-06-01 MED ORDER — PREGABALIN 150 MG PO CAPS: 150.0000 mg | ORAL_CAPSULE | Freq: Two times a day (BID) | ORAL | 2 refills | Status: DC

## 2017-06-01 MED ORDER — OXYCODONE HCL 5 MG PO TABS: 5.0000 mg | ORAL_TABLET | Freq: Three times a day (TID) | ORAL | 0 refills | Status: DC | PRN

## 2017-06-01 MED ORDER — TIZANIDINE HCL 4 MG PO TABS: 4.0000 mg | ORAL_TABLET | Freq: Two times a day (BID) | ORAL | 2 refills | Status: DC | PRN

## 2017-06-01 NOTE — Patient Instructions (Signed)

## 2017-06-01 NOTE — Progress Notes (Signed)
Patient's Name: Amber Acosta  MRN: [id number]  Referring Provider: Sharyne Peach, MD  DOB: 11/29/1951  PCP: Sharyne Peach, MD  DOS: 06/01/2017  Note by: Vevelyn Francois NP  Service setting: Ambulatory outpatient  Specialty: Interventional Pain Management  Location: [facility] (AMB) Pain Management Facility    Patient type: Established    Primary Reason(s) for Visit: Encounter for prescription drug management. (Level of risk: moderate)  CC: Shoulder Pain (bilateral shoulder blades); Headache (left sided); and Neck Pain  HPI  Amber Acosta is a 66 y.o. year old, female patient, who comes today for a medication management evaluation. She has Cervical vertebral fusion (Uncomplicated C5 through C7 ACDF.); Fibromyalgia; Lumbar facet joint pain; Lumbar facet syndrome (Bilateral) (R>L); Chronic kidney disease; COPD (chronic obstructive pulmonary disease) ([place]); Depression; Type 2 diabetes mellitus ([place]); Hypertensive heart disease with congestive heart failure ([place]); Gall bladder polyp; GERD (gastroesophageal reflux disease); Hyperlipemia; Inflammatory polyarthropathy ([place]); Sleep terror; Obesity, Class II, BMI 35-39.9, with comorbidity; OP (osteoporosis); Psoriasis; Kidney cysts; Muscle weakness (generalized); Sarcoidosis; Thrombocytopenia ([place]); Vitamin D deficiency; Chronic neck pain (Location of Secondary source of pain) (Bilateral) (L>R); Cervicogenic headache (Left); Cervical facet syndrome (Left); Chronic shoulder pain (Location of Tertiary source of pain) (Bilateral) (L>R); At risk for falls; Balance problems; History of closed head injury; Opiate use (22.5 MME/Day); Long term current use of opiate analgesic; Encounter for therapeutic drug level monitoring; Long term prescription opiate use; Spondylolisthesis of cervical region; 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[place]); Hx of cervical spine surgery; Low magnesium levels; Myofascial pain; Neuropathy; Benign paroxysmal positional vertigo; Diabetic peripheral neuropathy ([place]);  Meralgia paresthetica (Right); Cervical spondylosis; Chronic diastolic congestive heart failure ([place]); Mixed stress and urge urinary incontinence; Overactive detrusor; 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); Recurrent major depressive disorder, in partial remission ([place]); and Right arm weakness on her problem list. Her primarily concern today is the Shoulder Pain (bilateral shoulder blades); Headache (left sided); and Neck Pain  Pain Assessment: Location: Left, Right Shoulder Radiating: radiates to upper back and neck Onset: More than a month ago Duration: Chronic pain Quality: Sharp, Stabbing, Radiating, Spasm Severity: 2 /10 (self-reported pain score)  Note: Reported level is compatible with observation.                   Effect on ADL:   Timing: Intermittent Modifying factors: medications, rest,   Amber Acosta was last scheduled for an appointment on 03/18/17 for medication management. During today's appointment we reviewed Amber Acosta's chronic pain status, as well as her outpatient medication regimen. She denies any numbness and tinging.  The patient  reports that she does not use drugs. Her body mass index is 39.86 kg/m.  Further details on both, my assessment(s), as well as the proposed treatment plan, please see below.  Controlled Substance Pharmacotherapy Assessment REMS (Risk Evaluation and Mitigation Strategy)  Analgesic:Oxycodone IR 5 mg every 8 hours (15 mg/day) MME/day:22.5 mg/day Amber Rochester, RN  06/01/2017  8:31 AM  Sign at close encounter Nursing Pain Medication Assessment:  Safety precautions to be maintained  throughout the outpatient stay will include: orient to surroundings, keep bed in low position, maintain call bell within reach at all times, provide assistance with transfer out of bed and  ambulation.  Medication Inspection Compliance: Pill count conducted under aseptic conditions, in front of the patient. Neither the pills nor the bottle was removed from the patient's sight at any time. Once count was completed pills were immediately returned to the patient in their original bottle.  Medication: Oxycodone IR Pill/Patch Count: 30 of 90 pills remain Pill/Patch Appearance: Markings consistent with prescribed medication Bottle Appearance: Standard pharmacy container. Clearly labeled. Filled Date: 06 / 10 / 2018 Last Medication intake:  Today   Pharmacokinetics: Liberation and absorption (onset of action): WNL Distribution (time to peak effect): WNL Metabolism and excretion (duration of action): WNL         Pharmacodynamics: Desired effects: Analgesia: Ms. Batch reports >50% benefit. Functional ability: Patient reports that medication allows her to accomplish basic ADLs Clinically meaningful improvement in function (CMIF): Sustained CMIF goals met Perceived effectiveness: Described as relatively effective, allowing for increase in activities of daily living (ADL) Undesirable effects: Side-effects or Adverse reactions: None reported Monitoring:  PMP: Online review of the past 55-monthperiod conducted. Compliant with practice rules and regulations List of all UDS test(s) done:  Lab Results  Component Value Date   TOXASSSELUR FINAL 12/16/2016   TBuckhornFINAL 03/26/2016   TBellaireFINAL 12/19/2015   Last UDS on record: ToxAssure Select 13  Date Value Ref Range Status  12/16/2016 FINAL  Final    Comment:    ==================================================================== TOXASSURE SELECT 13 (MW) ==================================================================== Test                              Result       Flag       Units Drug Present and Declared for Prescription Verification   Oxycodone                      848          EXPECTED   ng/mg creat   Noroxycodone                   1010         EXPECTED   ng/mg creat    Sources of oxycodone include scheduled prescription medications.    Noroxycodone is an expected metabolite of oxycodone. ==================================================================== Test                      Result    Flag   Units      Ref Range   Creatinine              98               mg/dL      >=20 ==================================================================== Declared Medications:  The flagging and interpretation on this report are based on the  following declared medications.  Unexpected results may arise from  inaccuracies in the declared medications.  **Note: The testing scope of this panel includes these medications:  Oxycodone  **Note: The testing scope of this panel does not include following  reported medications:  Albuterol  Amlodipine (Norvasc)  Aspirin (Aspirin 81)  Budenoside (Symbicort)  Fluoxetine (Prozac)  Formoterol (Symbicort)  Furosemide (Lasix)  Hydralazine (Apresoline)  Insulin (Humulin)  Losartan (Cozaar)  Lovastatin (Mevacor)  Magnesium (Mag-Ox)  Metoprolol (Lopressor)  Metoprolol (Toprol)  Nitroglycerin  Potassium (Klor-Con)  Pregabalin (Lyrica)  Tizanidine  Trazodone  Vitamin D2 (Drisdol) ==================================================================== For clinical consultation, please call ([phone] ====================================================================    UDS interpretation: Compliant  Medication Assessment Form: Reviewed. Patient indicates being compliant with therapy Treatment compliance: Compliant Risk Assessment Profile: Aberrant behavior: See prior evaluations. None observed or detected today Comorbid factors increasing risk of overdose: See  prior notes. No additional risks detected today Risk of substance use disorder (SUD): Low Opioid Risk Tool (ORT) Total Score: 7  Interpretation Table:  Score <3 = Low Risk for SUD  Score between 4-7 = Moderate Risk for SUD  Score >8 = High Risk for Opioid Abuse   Risk Mitigation Strategies:  Patient Counseling: Covered Patient-Prescriber Agreement (PPA): Present and active  Notification to other healthcare providers: Done  Pharmacologic Plan: No change in therapy, at this time  Laboratory Chemistry  Inflammation Markers (CRP: Acute Phase) (ESR: Chronic Phase) Lab Results  Component Value Date   CRP 2.1 (H) 03/26/2016   ESRSEDRATE 52 (H) 03/26/2016                 Renal Function Markers Lab Results  Component Value Date   BUN 14 03/26/2016   CREATININE 1.28 (H) 03/26/2016   GFRAA 50 (L) 03/26/2016   GFRNONAA 44 (L) 03/26/2016                 Hepatic Function Markers Lab Results  Component Value Date   AST 24 03/26/2016   ALT 20 03/26/2016   ALBUMIN 4.0 03/26/2016   ALKPHOS 70 03/26/2016                 Electrolytes Lab Results  Component Value Date   NA 136 03/26/2016   K 3.8 03/26/2016   CL 100 (L) 03/26/2016   CALCIUM 8.5 (L) 03/26/2016   MG 1.8 03/26/2016                 Neuropathy Markers Lab Results  Component Value Date   VITAMINB12 467 03/26/2016                 Bone Pathology Markers Lab Results  Component Value Date   ALKPHOS 70 03/26/2016   VD25OH 20.2 (L) 03/26/2016   CALCIUM 8.5 (L) 03/26/2016                 Coagulation Parameters Lab Results  Component Value Date   PLT 146 (L) 03/26/2016                 Cardiovascular Markers No results found for: BNP, HGB, HCT               Note: Lab results reviewed.  Recent Diagnostic Imaging Review  Dg C-arm 1-60 Min-no Report  Result Date: 01/26/2017 Fluoroscopy was utilized by the requesting physician.  No radiographic interpretation.   Note: Imaging results reviewed.           Meds   Current Meds  Medication Sig  . albuterol (PROVENTIL HFA;VENTOLIN HFA) 108 (90 Base) MCG/ACT inhaler Inhale 2 puffs into the lungs every 6 (six) hours as needed.   Marland Kitchen amLODipine (NORVASC) 10 MG tablet Take 10 mg by mouth daily.   . B-D INS SYR ULTRAFINE 1CC/30G 30G X 1/2" 1 ML MISC   . B-D INS SYR ULTRAFINE 1CC/31G 31G X 5/16" 1 ML MISC   . budesonide-formoterol (SYMBICORT) 160-4.5 MCG/ACT inhaler Inhale 2 puffs into the lungs 2 (two) times daily.   Marland Kitchen FLUoxetine (PROZAC) 40 MG capsule Take 40 mg by mouth daily.   . furosemide (LASIX) 40 MG tablet Take 40 mg by mouth daily.  . hydrALAZINE (APRESOLINE) 25 MG  tablet Take 25 mg by mouth 2 (two) times daily.   Marland Kitchen KLOR-CON 10 10 MEQ tablet Take 10 mEq by mouth daily.   Marland Kitchen losartan (COZAAR) 100 MG tablet TAKE 1 TABLET (100 MG TOTAL) BY MOUTH ONCE DAILY.  Marland Kitchen lovastatin (MEVACOR) 10 MG tablet Take 10 mg by mouth at bedtime.  . metoprolol succinate (TOPROL-XL) 50 MG 24 hr tablet Take 25 mg by mouth daily.   . nitroGLYCERIN (NITROSTAT) 0.3 MG SL tablet 1 tablet sublingual prn for ESOPHAGEAL SPASMS(may repeat every 5 min, seek med help if pain persists after 3 tablets)  . [START ON 07/09/2017] oxyCODONE (OXY IR/ROXICODONE) 5 MG immediate release tablet Take 1 tablet (5 mg total) by mouth every 8 (eight) hours as needed for moderate pain or severe pain.  . phentermine 15 MG capsule TAKE ONE CAPSULE BY MOUTH EVERY AM BEFORE BREAKFAST  . pregabalin (LYRICA) 150 MG capsule Take 1 capsule (150 mg total) by mouth 2 (two) times daily.  Marland Kitchen Spacer/Aero-Holding Chambers ([place]) MISC See admin instructions.  Marland Kitchen tiZANidine (ZANAFLEX) 4 MG tablet Take 1 tablet (4 mg total) by mouth 2 (two) times daily as needed for muscle spasms.  . traZODone (DESYREL) 100 MG tablet Take 200 mg by mouth at bedtime.   . [DISCONTINUED] oxyCODONE (OXY IR/ROXICODONE) 5 MG immediate release tablet Take 1 tablet (5 mg total) by mouth every 8 (eight) hours as needed for  moderate pain or severe pain.  . [DISCONTINUED] pregabalin (LYRICA) 150 MG capsule Take 1 capsule (150 mg total) by mouth 2 (two) times daily.  . [DISCONTINUED] tiZANidine (ZANAFLEX) 4 MG tablet Take 1 tablet (4 mg total) by mouth 2 (two) times daily as needed for muscle spasms.    ROS  Constitutional: Denies any fever or chills Gastrointestinal: No reported hemesis, hematochezia, vomiting, or acute GI distress Musculoskeletal: Denies any acute onset joint swelling, redness, loss of ROM, or weakness Neurological: No reported episodes of acute onset apraxia, aphasia, dysarthria, agnosia, amnesia, paralysis, loss of coordination, or loss of consciousness  Allergies  Amber Acosta is allergic to enbrel [etanercept]; metformin; methotrexate; mushroom extract complex; other; darvon [propoxyphene]; minocycline; and ciprofloxacin.  PFSH  Drug: Amber Acosta  reports that she does not use drugs. Alcohol:  reports that she does not drink alcohol. Tobacco:  reports that she has quit smoking. She has quit using smokeless tobacco. Medical:  has a past medical history of Arthritis; Cervical spondylosis without myelopathy (09/21/2015); Chronic pain; Chronic pain associated with significant psychosocial dysfunction (08/28/2013); Depression; Diabetes mellitus without complication ([place]); Displacement of cervical intervertebral disc without myelopathy (09/05/2015); Fibromyalgia; GERD (gastroesophageal reflux disease); Hypertension; Kidney disease, chronic, stage III (GFR 30-59 ml/min); Sarcoid; and Thrombopenia ([place]). Surgical: Amber Acosta  has a past surgical history that includes neck fusion; Breast surgery; Back surgery; Knee arthroscopy; Fracture surgery; Hand surgery; and Abdominal hysterectomy. Family: family history includes Alcohol abuse in her brother; Alzheimer's disease in her mother; COPD in her father; Diabetes in her father; Heart disease in her father.  Constitutional Exam  General appearance: Well  nourished, well developed, and well hydrated. In no apparent acute distress Vitals:   06/01/17 0817  BP: (!) 117/56  Pulse: 60  Resp: 18  Temp: 98.4 F (36.9 C)  TempSrc: Oral  SpO2: 95%  Weight: 225 lb (102.1 kg)  Height: '5\' 3"'$  (1.6 m)   BMI Assessment: Estimated body mass index is 39.86 kg/m as calculated from the following:   Height as of this encounter: '5\' 3"'$  (1.6 m).  Weight as of this encounter: 225 lb (102.1 kg).  BMI interpretation table: BMI level Category Range association with higher incidence of chronic pain  <18 kg/m2 Underweight   18.5-24.9 kg/m2 Ideal body weight   25-29.9 kg/m2 Overweight Increased incidence by 20%  30-34.9 kg/m2 Obese (Class I) Increased incidence by 68%  35-39.9 kg/m2 Severe obesity (Class II) Increased incidence by 136%  >40 kg/m2 Extreme obesity (Class III) Increased incidence by 254%   BMI Readings from Last 4 Encounters:  06/01/17 39.86 kg/m  03/05/17 40.74 kg/m  02/18/17 40.74 kg/m  01/26/17 40.74 kg/m   Wt Readings from Last 4 Encounters:  06/01/17 225 lb (102.1 kg)  03/05/17 230 lb (104.3 kg)  02/18/17 230 lb (104.3 kg)  01/26/17 230 lb (104.3 kg)  Psych/Mental status: Alert, oriented x 3 (person, place, & time)       Eyes: PERLA Respiratory: No evidence of acute respiratory distress  Cervical Spine Exam  Inspection: No masses, redness, or swelling Alignment: Symmetrical Functional ROM: Unrestricted ROM      Stability: No instability detected Muscle strength & Tone: Functionally intact Sensory: Unimpaired Palpation: No palpable anomalies              Upper Extremity (UE) Exam    Side: Right upper extremity  Side: Left upper extremity  Inspection: No masses, redness, swelling, or asymmetry. No contractures  Inspection: No masses, redness, swelling, or asymmetry. No contractures  Functional ROM: Unrestricted ROM          Functional ROM: Unrestricted ROM          Muscle strength & Tone: Functionally intact  Muscle  strength & Tone: Functionally intact  Sensory: Unimpaired  Sensory: Unimpaired  Palpation: No palpable anomalies              Palpation: No palpable anomalies              Specialized Test(s): Deferred         Specialized Test(s): Deferred          Thoracic Spine Exam  Inspection: No masses, redness, or swelling Alignment: Symmetrical Functional ROM: Unrestricted ROM Stability: No instability detected Sensory: Unimpaired Muscle strength & Tone: No palpable anomalies  Lumbar Spine Exam  Inspection: No masses, redness, or swelling Alignment: Symmetrical Functional ROM: Unrestricted ROM      Stability: No instability detected Muscle strength & Tone: Functionally intact Sensory: Unimpaired Palpation: No palpable anomalies       Provocative Tests: Lumbar Hyperextension and rotation test: evaluation deferred today       Patrick's Maneuver: evaluation deferred today                    Gait & Posture Assessment  Ambulation: Unassisted Gait: Relatively normal for age and body habitus Posture: WNL   Lower Extremity Exam    Side: Right lower extremity  Side: Left lower extremity  Inspection: No masses, redness, swelling, or asymmetry. No contractures  Inspection: No masses, redness, swelling, or asymmetry. No contractures  Functional ROM: Unrestricted ROM          Functional ROM: Unrestricted ROM          Muscle strength & Tone: Functionally intact  Muscle strength & Tone: Functionally intact  Sensory: Unimpaired  Sensory: Unimpaired  Palpation: No palpable anomalies  Palpation: No palpable anomalies   Assessment  Primary Diagnosis & Pertinent Problem List: The primary encounter diagnosis was Long term current use of opiate analgesic. Diagnoses of Chronic pain syndrome,  Myofascial pain, Fibromyalgia, Cervical facet syndrome (Left), and Cervicogenic headache (Left) were also pertinent to this visit.  Status Diagnosis  Controlled Controlled Controlled 1. Long term current use of  opiate analgesic   2. Chronic pain syndrome   3. Myofascial pain   4. Fibromyalgia   5. Cervical facet syndrome (Left)   6. Cervicogenic headache (Left)     Problems updated and reviewed during this visit: No problems updated. Plan of Care  Pharmacotherapy (Medications Ordered): Meds ordered this encounter  Medications  . oxyCODONE (OXY IR/ROXICODONE) 5 MG immediate release tablet    Sig: Take 1 tablet (5 mg total) by mouth every 8 (eight) hours as needed for moderate pain or severe pain.    Dispense:  90 tablet    Refill:  0    Do not place this medication, or any other prescription from our practice, on "Automatic Refill". Patient may have prescription filled one day early if pharmacy is closed on scheduled refill date. Do not fill until:07/09/17 To last until: 08/08/17    Order Specific Question:   Supervising Provider    Answer:   Milinda Pointer [phone]  . oxyCODONE (OXY IR/ROXICODONE) 5 MG immediate release tablet    Sig: Take 1 tablet (5 mg total) by mouth every 8 (eight) hours as needed for moderate pain or severe pain.    Dispense:  90 tablet    Refill:  0    Do not place this medication, or any other prescription from our practice, on "Automatic Refill". Patient may have prescription filled one day early if pharmacy is closed on scheduled refill date. Do not fill until: 08/08/17 To last until: 09/07/17    Order Specific Question:   Supervising Provider    Answer:   Milinda Pointer [phone]  . oxyCODONE (OXY IR/ROXICODONE) 5 MG immediate release tablet    Sig: Take 1 tablet (5 mg total) by mouth every 8 (eight) hours as needed for moderate pain or severe pain.    Dispense:  90 tablet    Refill:  0    Do not place this medication, or any other prescription from our practice, on "Automatic Refill". Patient may have prescription filled one day early if pharmacy is closed on scheduled refill date. Do not fill until: 10/08//18 To last until: 10/07/17    Order Specific  Question:   Supervising Provider    Answer:   Milinda Pointer [phone]  . tiZANidine (ZANAFLEX) 4 MG tablet    Sig: Take 1 tablet (4 mg total) by mouth 2 (two) times daily as needed for muscle spasms.    Dispense:  60 tablet    Refill:  2    Do not place this medication, or any other prescription from our practice, on "Automatic Refill". Patient may have prescription filled one day early if pharmacy is closed on scheduled refill date.    Order Specific Question:   Supervising Provider    Answer:   Milinda Pointer [phone]  . pregabalin (LYRICA) 150 MG capsule    Sig: Take 1 capsule (150 mg total) by mouth 2 (two) times daily.    Dispense:  60 capsule    Refill:  2    Do not place this medication, or any other prescription from our practice, on "Automatic Refill". Patient may have prescription filled one day early if pharmacy is closed on scheduled refill date.    Order Specific Question:   Supervising Provider    Answer:   Milinda Pointer [phone]  New Prescriptions   No medications on file   Medications administered today: Amber Acosta had no medications administered during this visit. Lab-work, procedure(s), and/or referral(s): Orders Placed This Encounter  Procedures  . ToxASSURE Select 13 (MW), Urine   Imaging and/or referral(s): None  Interventional therapies: Planned, scheduled, and/or pending:   Left Cervical Facet RFA under fluoro and IV sedation   Considering:   Palliativeleft sided cervical facet block #3 Diagnostic/palliative bilateral lumbar facet block Possible bilateral lumbar facet radiofrequency ablation Diagnostic/palliative left cervical facet block #2 Possible left-sided cervical facet radiofrequency ablation   Palliative PRN treatment(s):   Diagnostic/palliative bilateral lumbar facet block Diagnostic/palliative left cervical facet block #2 Palliativeleft sided cervical facet block #3     Provider-requested follow-up: Return in about 3  months (around 09/01/2017) for MedMgmt.  Future Appointments Date Time Provider [place]  06/22/2017 10:15 AM Milinda Pointer, MD [facility] None  08/31/2017 8:45 AM Vevelyn Francois, NP [facility] None   Primary Care Physician: Sharyne Peach, MD Location: [facility] Note by: Vevelyn Francois NP Date: 06/01/2017; Time: 10:15 AM  Pain Score Disclaimer: We use the NRS-11 scale. This is a self-reported, subjective measurement of pain severity with only modest accuracy. It is used primarily to identify changes within a particular patient. It must be understood that outpatient pain scales are significantly less accurate that those used for research, where they can be applied under ideal controlled circumstances with minimal exposure to variables. In reality, the score is likely to be a combination of pain intensity and pain affect, where pain affect describes the degree of emotional arousal or changes in action readiness caused by the sensory experience of pain. Factors such as social and work situation, setting, emotional state, anxiety levels, expectation, and prior pain experience may influence pain perception and show large inter-individual differences that may also be affected by time variables.  Patient instructions provided during this appointment: Patient Instructions   ____________________________________________________________________________________________  Medication Rules  Applies to: All patients receiving prescriptions (written or electronic).  Pharmacy of record: Pharmacy where electronic prescriptions will be sent. If written prescriptions are taken to a different pharmacy, please inform the nursing staff. The pharmacy listed in the electronic medical record should be the one where you would like electronic prescriptions to be sent.  Prescription refills: Only during scheduled appointments. Applies to both, written and electronic  prescriptions.  NOTE: The following applies primarily to controlled substances (Opioid* Pain Medications).   Patient's responsibilities: 1. Pain Pills: Bring all pain pills to every appointment (except for procedure appointments). 2. Pill Bottles: Bring pills in original pharmacy bottle. Always bring newest bottle. Bring bottle, even if empty. 3. Medication refills: You are responsible for knowing and keeping track of what medications you need refilled. The day before your appointment, write a list of all prescriptions that need to be refilled. Bring that list to your appointment and give it to the admitting nurse. Prescriptions will be written only during appointments. If you forget a medication, it will not be "Called in", "Faxed", or "electronically sent". You will need to get another appointment to get these prescribed. 4. Prescription Accuracy: You are responsible for carefully inspecting your prescriptions before leaving our office. Have the discharge nurse carefully go over each prescription with you, before taking them home. Make sure that your name is accurately spelled, that your address is correct. Check the name and dose of your medication to make sure it is accurate. Check the number of pills,  and the written instructions to make sure they are clear and accurate. Make sure that you are given enough medication to last until your next medication refill appointment. 5. Taking Medication: Take medication as prescribed. Never take more pills than instructed. Never take medication more frequently than prescribed. Taking less pills or less frequently is permitted and encouraged, when it comes to controlled substances (written prescriptions).  6. Inform other Doctors: Always inform, all of your healthcare providers, of all the medications you take. 7. Pain Medication from other Providers: You are not allowed to accept any additional pain medication from any other Doctor or Healthcare provider. There  are two exceptions to this rule. (see below) In the event that you require additional pain medication, you are responsible for notifying us, as stated below. 8. Medication Agreement: You are responsible for carefully reading and following our Medication Agreement. This must be signed before receiving any prescriptions from our practice. Safely store a copy of your signed Agreement. Violations to the Agreement will result in no further prescriptions. (Additional copies of our Medication Agreement are available upon request.) 9. Laws, Rules, & Regulations: All patients are expected to follow all Federal and [employer], [employer], Rules, [employer] [facility]. Ignorance of the Laws does not constitute a valid excuse. The use of any illegal substances is prohibited. 10. Adopted CDC guidelines & recommendations: Target dosing levels will be at or below 60 MME/day. Use of benzodiazepines** is not recommended.  Exceptions: There are only two exceptions to the rule of not receiving pain medications from other Healthcare Providers. 1. Exception #1 (Emergencies): In the event of an emergency (i.e.: accident requiring emergency care), you are allowed to receive additional pain medication. However, you are responsible for: As soon as you are able, call our office [phone], at any time of the day or night, and leave a message stating your name, the date and nature of the emergency, and the name and dose of the medication prescribed. In the event that your call is answered by a member of our staff, make sure to document and save the date, time, and the name of the person that took your information.  2. Exception #2 (Planned Surgery): In the event that you are scheduled by another doctor or dentist to have any type of surgery or procedure, you are allowed (for a period no longer than 30 days), to receive additional pain medication, for the acute post-op pain. However, in this case, you are responsible for picking up a copy of  our "Post-op Pain Management for Surgeons" handout, and giving it to your surgeon or dentist. This document is available at our office, and does not require an appointment to obtain it. Simply go to our office during business hours (Monday-Thursday from 8:00 AM to 4:00 PM) (Friday 8:00 AM to 12:00 Noon) or if you have a scheduled appointment with [place], prior to your surgery, and ask for it by name. In addition, you will need to provide [place] with your name, name of your surgeon, type of surgery, and date of procedure or surgery.  *Opioid medications include: morphine, codeine, oxycodone, oxymorphone, hydrocodone, hydromorphone, meperidine, tramadol, tapentadol, buprenorphine, fentanyl, methadone. **Benzodiazepine medications include: diazepam (Valium), alprazolam (Xanax), clonazepam (Klonopine), lorazepam (Ativan), clorazepate (Tranxene), chlordiazepoxide (Librium), estazolam (Prosom), oxazepam (Serax), temazepam (Restoril), triazolam (Halcion)  ____________________________________________________________________________________________

## 2017-06-01 NOTE — Progress Notes (Signed)
Nursing Pain Medication Assessment:  Safety precautions to be maintained throughout the outpatient stay will include: orient to surroundings, keep bed in low position, maintain call bell within reach at all times, provide assistance with transfer out of bed and ambulation.  Medication Inspection Compliance: Pill count conducted under aseptic conditions, in front of the patient. Neither the pills nor the bottle was removed from the patient's sight at any time. Once count was completed pills were immediately returned to the patient in their original bottle.  Medication: Oxycodone IR Pill/Patch Count: 30 of 90 pills remain Pill/Patch Appearance: Markings consistent with prescribed medication Bottle Appearance: Standard pharmacy container. Clearly labeled. Filled Date: 06 / 10 / 2018 Last Medication intake:  Today

## 2017-06-07 LAB — TOXASSURE SELECT 13 (MW), URINE

## 2017-06-22 ENCOUNTER — Encounter: Payer: Self-pay | Admitting: Pain Medicine

## 2017-06-22 ENCOUNTER — Ambulatory Visit
Admission: RE | Admit: 2017-06-22 | Discharge: 2017-06-22 | Disposition: A | Discharge status: Home / Self Care | Payer: Medicare Other | Source: Ambulatory Visit | Attending: Pain Medicine | Admitting: Pain Medicine

## 2017-06-22 ENCOUNTER — Other Ambulatory Visit: Payer: Self-pay | Admitting: Pain Medicine

## 2017-06-22 ENCOUNTER — Ambulatory Visit (HOSPITAL_BASED_OUTPATIENT_CLINIC_OR_DEPARTMENT_OTHER): Payer: Medicare Other | Admitting: Pain Medicine

## 2017-06-22 VITALS — BP 126/82 | HR 71 | Temp 96.5°F | Resp 17 | Ht 63.0 in | Wt 229.0 lb

## 2017-06-22 DIAGNOSIS — M25511 Pain in right shoulder: Secondary | ICD-10-CM | POA: Insufficient documentation

## 2017-06-22 DIAGNOSIS — M452 Ankylosing spondylitis of cervical region: Secondary | ICD-10-CM

## 2017-06-22 DIAGNOSIS — M4322 Fusion of spine, cervical region: Secondary | ICD-10-CM | POA: Diagnosis not present

## 2017-06-22 DIAGNOSIS — M4682 Other specified inflammatory spondylopathies, cervical region: Secondary | ICD-10-CM | POA: Diagnosis not present

## 2017-06-22 DIAGNOSIS — M47812 Spondylosis without myelopathy or radiculopathy, cervical region: Secondary | ICD-10-CM

## 2017-06-22 DIAGNOSIS — M549 Dorsalgia, unspecified: Secondary | ICD-10-CM

## 2017-06-22 DIAGNOSIS — R51 Headache: Secondary | ICD-10-CM | POA: Insufficient documentation

## 2017-06-22 DIAGNOSIS — M4722 Other spondylosis with radiculopathy, cervical region: Secondary | ICD-10-CM

## 2017-06-22 DIAGNOSIS — G8918 Other acute postprocedural pain: Secondary | ICD-10-CM

## 2017-06-22 DIAGNOSIS — M25512 Pain in left shoulder: Secondary | ICD-10-CM | POA: Insufficient documentation

## 2017-06-22 DIAGNOSIS — M5412 Radiculopathy, cervical region: Secondary | ICD-10-CM

## 2017-06-22 DIAGNOSIS — M542 Cervicalgia: Secondary | ICD-10-CM | POA: Insufficient documentation

## 2017-06-22 DIAGNOSIS — G894 Chronic pain syndrome: Secondary | ICD-10-CM

## 2017-06-22 DIAGNOSIS — Z9889 Other specified postprocedural states: Secondary | ICD-10-CM | POA: Insufficient documentation

## 2017-06-22 DIAGNOSIS — Z981 Arthrodesis status: Secondary | ICD-10-CM | POA: Diagnosis not present

## 2017-06-22 DIAGNOSIS — G8929 Other chronic pain: Secondary | ICD-10-CM

## 2017-06-22 DIAGNOSIS — M4692 Unspecified inflammatory spondylopathy, cervical region: Secondary | ICD-10-CM | POA: Insufficient documentation

## 2017-06-22 DIAGNOSIS — G4486 Cervicogenic headache: Secondary | ICD-10-CM

## 2017-06-22 DIAGNOSIS — R209 Unspecified disturbances of skin sensation: Secondary | ICD-10-CM

## 2017-06-22 DIAGNOSIS — Z6841 Body Mass Index (BMI) 40.0 and over, adult: Secondary | ICD-10-CM | POA: Diagnosis not present

## 2017-06-22 DIAGNOSIS — F419 Anxiety disorder, unspecified: Secondary | ICD-10-CM | POA: Diagnosis not present

## 2017-06-22 DIAGNOSIS — Z881 Allergy status to other antibiotic agents status: Secondary | ICD-10-CM | POA: Insufficient documentation

## 2017-06-22 MED ORDER — SODIUM CHLORIDE 0.9 % IJ SOLN
INTRAMUSCULAR | Status: AC
  Filled 2017-06-22: qty 10

## 2017-06-22 MED ORDER — GLYCOPYRROLATE 0.2 MG/ML IJ SOLN
INTRAMUSCULAR | Status: AC
  Filled 2017-06-22: qty 1

## 2017-06-22 MED ORDER — DEXAMETHASONE SODIUM PHOSPHATE 10 MG/ML IJ SOLN
10.0000 mg | Freq: Once | INTRAMUSCULAR | Status: AC
  Administered 2017-06-22: 10 mg
  Filled 2017-06-22: qty 1

## 2017-06-22 MED ORDER — LACTATED RINGERS IV SOLN
1000.0000 mL | Freq: Once | INTRAVENOUS | Status: AC
  Administered 2017-06-22: 1000 mL via INTRAVENOUS

## 2017-06-22 MED ORDER — LIDOCAINE HCL (PF) 1.5 % IJ SOLN
20.0000 mL | Freq: Once | INTRAMUSCULAR | Status: AC
  Administered 2017-06-22: 20 mL
  Filled 2017-06-22: qty 20

## 2017-06-22 MED ORDER — HYDROCODONE-ACETAMINOPHEN 5-325 MG PO TABS: 1.0000 | ORAL_TABLET | Freq: Three times a day (TID) | ORAL | 0 refills | Status: DC | PRN

## 2017-06-22 MED ORDER — FENTANYL CITRATE (PF) 100 MCG/2ML IJ SOLN
25.0000 ug | INTRAMUSCULAR | Status: DC | PRN
  Administered 2017-06-22: 100 ug via INTRAVENOUS
  Filled 2017-06-22: qty 2

## 2017-06-22 MED ORDER — MIDAZOLAM HCL 5 MG/5ML IJ SOLN
1.0000 mg | INTRAMUSCULAR | Status: DC | PRN
  Administered 2017-06-22: 3 mg via INTRAVENOUS
  Filled 2017-06-22: qty 5

## 2017-06-22 MED ORDER — EPHEDRINE SULFATE 50 MG/ML IJ SOLN
INTRAMUSCULAR | Status: AC
  Filled 2017-06-22: qty 1

## 2017-06-22 MED ORDER — ROPIVACAINE HCL 2 MG/ML IJ SOLN
9.0000 mL | Freq: Once | INTRAMUSCULAR | Status: AC
  Administered 2017-06-22: 10 mL via PERINEURAL
  Filled 2017-06-22: qty 10

## 2017-06-22 NOTE — Progress Notes (Signed)
Safety precautions to be maintained throughout the outpatient stay will include: orient to surroundings, keep bed in low position, maintain call bell within reach at all times, provide assistance with transfer out of bed and ambulation.  

## 2017-06-22 NOTE — Patient Instructions (Addendum)
____________________________________________________________________________________________  Post-Procedure instructions Instructions:  Apply ice: Fill a plastic sandwich bag with crushed ice. Cover it with a small towel and apply to injection site. Apply for 15 minutes then remove x 15 minutes. Repeat sequence on day of procedure, until you go to bed. The purpose is to minimize swelling and discomfort after procedure.  Apply heat: Apply heat to procedure site starting the day following the procedure. The purpose is to treat any soreness and discomfort from the procedure.  Food intake: Start with clear liquids (like water) and advance to regular food, as tolerated.   Physical activities: Keep activities to a minimum for the first 8 hours after the procedure.   Driving: If you have received any sedation, you are not allowed to drive for 24 hours after your procedure.  Blood thinner: Restart your blood thinner 6 hours after your procedure. (Only for those taking blood thinners)  Insulin: As soon as you can eat, you may resume your normal dosing schedule. (Only for those taking insulin)  Infection prevention: Keep procedure site clean and dry.  Post-procedure Pain Diary: Extremely important that this be done correctly and accurately. Recorded information will be used to determine the next step in treatment.  Pain evaluated is that of treated area only. Do not include pain from an untreated area.  Complete every hour, on the hour, for the initial 8 hours. Set an alarm to help you do this part accurately.  Do not go to sleep and have it completed later. It will not be accurate.  Follow-up appointment: Keep your follow-up appointment after the procedure. Usually 2 weeks for most procedures. (6 weeks in the case of radiofrequency.) Bring you pain diary.  Expect:  From numbing medicine (AKA: Local Anesthetics): Numbness or decrease in pain.  Onset: Full effect within 15 minutes of  injected.  Duration: It will depend on the type of local anesthetic used. On the average, 1 to 8 hours.   From steroids: Decrease in swelling or inflammation. Once inflammation is improved, relief of the pain will follow.  Onset of benefits: Depends on the amount of swelling present. The more swelling, the longer it will take for the benefits to be seen. In some cases, up to 10 days.  Duration: Steroids will stay in the system x 2 weeks. Duration of benefits will depend on multiple posibilities including persistent irritating factors.  From procedure: Some discomfort is to be expected once the numbing medicine wears off. This should be minimal if ice and heat are applied as instructed. Call if:  You experience numbness and weakness that gets worse with time, as opposed to wearing off.  New onset bowel or bladder incontinence. (Spinal procedures only)  Emergency Numbers:  Durning business hours (Monday - Thursday, 8:00 AM - 4:00 PM) (Friday, 9:00 AM - 12:00 Noon): [phone]  After hours: [phone] ____________________________________________________________________________________________  Pain Management Discharge Instructions  General Discharge Instructions :  If you need to reach your doctor call: Monday-Friday 8:00 am - 4:00 pm at [phone] or toll free [phone].  After clinic hours [phone] to have operator reach doctor.  Bring all of your medication bottles to all your appointments in the pain clinic.  To cancel or reschedule your appointment with Pain Management please remember to call 24 hours in advance to avoid a fee.  Refer to the educational materials which you have been given on: General Risks, I had my Procedure. Discharge Instructions, Post Sedation.  Post Procedure Instructions:  The drugs you   were given will stay in your system until tomorrow, so for the next 24 hours you should not drive, make any legal decisions or drink any alcoholic  beverages.  You may eat anything you prefer, but it is better to start with liquids then soups and crackers, and gradually work up to solid foods.  Please notify your doctor immediately if you have any unusual bleeding, trouble breathing or pain that is not related to your normal pain.  Depending on the type of procedure that was done, some parts of your body may feel week and/or numb.  This usually clears up by tonight or the next day.  Walk with the use of an assistive device or accompanied by an adult for the 24 hours.  You may use ice on the affected area for the first 24 hours.  Put ice in a Ziploc bag and cover with a towel and place against area 15 minutes on 15 minutes off.  You may switch to heat after 24 hours.GENERAL RISKS AND COMPLICATIONS  What are the risk, side effects and possible complications? Generally speaking, most procedures are safe.  However, with any procedure there are risks, side effects, and the possibility of complications.  The risks and complications are dependent upon the sites that are lesioned, or the type of nerve block to be performed.  The closer the procedure is to the spine, the more serious the risks are.  Great care is taken when placing the radio frequency needles, block needles or lesioning probes, but sometimes complications can occur. 1. Infection: Any time there is an injection through the skin, there is a risk of infection.  This is why sterile conditions are used for these blocks.  There are four possible types of infection. 1. Localized skin infection. 2. Central Nervous System Infection-This can be in the form of Meningitis, which can be deadly. 3. Epidural Infections-This can be in the form of an epidural abscess, which can cause pressure inside of the spine, causing compression of the spinal cord with subsequent paralysis. This would require an emergency surgery to decompress, and there are no guarantees that the patient would recover from the  paralysis. 4. Discitis-This is an infection of the intervertebral discs.  It occurs in about 1% of discography procedures.  It is difficult to treat and it may lead to surgery.        2. Pain: the needles have to go through skin and soft tissues, will cause soreness.       3. Damage to internal structures:  The nerves to be lesioned may be near blood vessels or    other nerves which can be potentially damaged.       4. Bleeding: Bleeding is more common if the patient is taking blood thinners such as  aspirin, Coumadin, Ticiid, Plavix, etc., or if he/she have some genetic predisposition  such as hemophilia. Bleeding into the spinal canal can cause compression of the spinal  cord with subsequent paralysis.  This would require an emergency surgery to  decompress and there are no guarantees that the patient would recover from the  paralysis.       5. Pneumothorax:  Puncturing of a lung is a possibility, every time a needle is introduced in  the area of the chest or upper back.  Pneumothorax refers to free air around the  collapsed lung(s), inside of the thoracic cavity (chest cavity).  Another two possible  complications related to a similar event would include: Hemothorax and   Chylothorax.   These are variations of the Pneumothorax, where instead of air around the collapsed  lung(s), you may have blood or chyle, respectively.       6. Spinal headaches: They may occur with any procedures in the area of the spine.       7. Persistent CSF (Cerebro-Spinal Fluid) leakage: This is a rare problem, but may occur  with prolonged intrathecal or epidural catheters either due to the formation of a fistulous  track or a dural tear.       8. Nerve damage: By working so close to the spinal cord, there is always a possibility of  nerve damage, which could be as serious as a permanent spinal cord injury with  paralysis.       9. Death:  Although rare, severe deadly allergic reactions known as "Anaphylactic  reaction" can  occur to any of the medications used.      10. Worsening of the symptoms:  We can always make thing worse.  What are the chances of something like this happening? Chances of any of this occuring are extremely low.  By statistics, you have more of a chance of getting killed in a motor vehicle accident: while driving to the hospital than any of the above occurring .  Nevertheless, you should be aware that they are possibilities.  In general, it is similar to taking a shower.  Everybody knows that you can slip, hit your head and get killed.  Does that mean that you should not shower again?  Nevertheless always keep in mind that statistics do not mean anything if you happen to be on the wrong side of them.  Even if a procedure has a 1 (one) in a 1,000,000 (million) chance of going wrong, it you happen to be that one..Also, keep in mind that by statistics, you have more of a chance of having something go wrong when taking medications.  Who should not have this procedure? If you are on a blood thinning medication (e.g. Coumadin, Plavix, see list of "Blood Thinners"), or if you have an active infection going on, you should not have the procedure.  If you are taking any blood thinners, please inform your physician.  How should I prepare for this procedure?  Do not eat or drink anything at least six hours prior to the procedure.  Bring a driver with you .  It cannot be a taxi.  Come accompanied by an adult that can drive you back, and that is strong enough to help you if your legs get weak or numb from the local anesthetic.  Take all of your medicines the morning of the procedure with just enough water to swallow them.  If you have diabetes, make sure that you are scheduled to have your procedure done first thing in the morning, whenever possible.  If you have diabetes, take only half of your insulin dose and notify our nurse that you have done so as soon as you arrive at the clinic.  If you are  diabetic, but only take blood sugar pills (oral hypoglycemic), then do not take them on the morning of your procedure.  You may take them after you have had the procedure.  Do not take aspirin or any aspirin-containing medications, at least eleven (11) days prior to the procedure.  They may prolong bleeding.  Wear loose fitting clothing that may be easy to take off and that you would not mind if it got stained with Betadine   or blood.  Do not wear any jewelry or perfume  Remove any nail coloring.  It will interfere with some of our monitoring equipment.  NOTE: Remember that this is not meant to be interpreted as a complete list of all possible complications.  Unforeseen problems may occur.  BLOOD THINNERS The following drugs contain aspirin or other products, which can cause increased bleeding during surgery and should not be taken for 2 weeks prior to and 1 week after surgery.  If you should need take something for relief of minor pain, you may take acetaminophen which is found in Tylenol,m Datril, Anacin-3 and Panadol. It is not blood thinner. The products listed below are.  Do not take any of the products listed below in addition to any listed on your instruction sheet.  A.P.C or A.P.C with Codeine Codeine Phosphate Capsules #3 Ibuprofen Ridaura  ABC compound Congesprin Imuran rimadil  Advil Cope Indocin Robaxisal  Alka-Seltzer Effervescent Pain Reliever and Antacid Coricidin or Coricidin-D  Indomethacin Rufen  Alka-Seltzer plus Cold Medicine Cosprin Ketoprofen S-A-C Tablets  Anacin Analgesic Tablets or Capsules Coumadin Korlgesic Salflex  Anacin Extra Strength Analgesic tablets or capsules CP-2 Tablets Lanoril Salicylate  Anaprox Cuprimine Capsules Levenox Salocol  Anexsia-D Dalteparin Magan Salsalate  Anodynos Darvon compound Magnesium Salicylate Sine-off  Ansaid Dasin Capsules Magsal Sodium Salicylate  Anturane Depen Capsules Marnal Soma  APF Arthritis pain formula Dewitt's Pills  Measurin Stanback  Argesic Dia-Gesic Meclofenamic Sulfinpyrazone  Arthritis Bayer Timed Release Aspirin Diclofenac Meclomen Sulindac  Arthritis pain formula Anacin Dicumarol Medipren Supac  Analgesic (Safety coated) Arthralgen Diffunasal Mefanamic Suprofen  Arthritis Strength Bufferin Dihydrocodeine Mepro Compound Suprol  Arthropan liquid Dopirydamole Methcarbomol with Aspirin Synalgos  ASA tablets/Enseals Disalcid Micrainin Tagament  Ascriptin Doan's Midol Talwin  Ascriptin A/D Dolene Mobidin Tanderil  Ascriptin Extra Strength Dolobid Moblgesic Ticlid  Ascriptin with Codeine Doloprin or Doloprin with Codeine Momentum Tolectin  Asperbuf Duoprin Mono-gesic Trendar  Aspergum Duradyne Motrin or Motrin IB Triminicin  Aspirin plain, buffered or enteric coated Durasal Myochrisine Trigesic  Aspirin Suppositories Easprin Nalfon Trillsate  Aspirin with Codeine Ecotrin Regular or Extra Strength Naprosyn Uracel  Atromid-S Efficin Naproxen Ursinus  Auranofin Capsules Elmiron Neocylate Vanquish  Axotal Emagrin Norgesic Verin  Azathioprine Empirin or Empirin with Codeine Normiflo Vitamin E  Azolid Emprazil Nuprin Voltaren  Bayer Aspirin plain, buffered or children's or timed BC Tablets or powders Encaprin Orgaran Warfarin Sodium  Buff-a-Comp Enoxaparin Orudis Zorpin  Buff-a-Comp with Codeine Equegesic Os-Cal-Gesic   Buffaprin Excedrin plain, buffered or Extra Strength Oxalid   Bufferin Arthritis Strength Feldene Oxphenbutazone   Bufferin plain or Extra Strength Feldene Capsules Oxycodone with Aspirin   Bufferin with Codeine Fenoprofen Fenoprofen Pabalate or Pabalate-SF   Buffets II Flogesic Panagesic   Buffinol plain or Extra Strength Florinal or Florinal with Codeine Panwarfarin   Buf-Tabs Flurbiprofen Penicillamine   Butalbital Compound Four-way cold tablets Penicillin   Butazolidin Fragmin Pepto-Bismol   Carbenicillin Geminisyn Percodan   Carna Arthritis Reliever Geopen Persantine    Carprofen Gold's salt Persistin   Chloramphenicol Goody's Phenylbutazone   Chloromycetin Haltrain Piroxlcam   Clmetidine heparin Plaquenil   Cllnoril Hyco-pap Ponstel   Clofibrate Hydroxy chloroquine Propoxyphen         Before stopping any of these medications, be sure to consult the physician who ordered them.  Some, such as Coumadin (Warfarin) are ordered to prevent or treat serious conditions such as "deep thrombosis", "pumonary embolisms", and other heart problems.  The amount of time that you may need   off of the medication may also vary with the medication and the reason for which you were taking it.  If you are taking any of these medications, please make sure you notify your pain physician before you undergo any procedures.          

## 2017-06-22 NOTE — Progress Notes (Signed)
Patient's Name: Amber Acosta  MRN: [id number]  Referring Provider: Delano MetzNaveira, Gyasi Hazzard, MD  DOB: 06/07/1951  PCP: Rayetta HumphreyGeorge, Sionne A, MD  DOS: 06/22/2017  Note by: Oswaldo DoneFrancisco A Jeovani Weisenburger, MD  Service setting: Ambulatory outpatient  Specialty: Interventional Pain Management  Patient type: Established  Location: [facility] (AMB) Pain Management Facility  Visit type: Interventional Procedure   Primary Reason for Visit: Interventional Pain Management Treatment. CC: Shoulder Pain (bilaterally- primarily left)  Procedure:  Anesthesia, Analgesia, Anxiolysis:  Type: Therapeutic Medial Branch Facet Radiofrequency Ablation Region: Posterolateral Cervical Level: C3, C4, C5, C6, & C7 Medial Branch Level(s) Laterality: Left-Sided Paraspinal  Type: Local Anesthesia with Moderate (Conscious) Sedation Local Anesthetic: Lidocaine 1% Route: Intravenous (IV) IV Access: Secured Sedation: Meaningful verbal contact was maintained at all times during the procedure  Indication(s): Analgesia and Anxiety  Indications: 1. Cervical facet syndrome (Left)   2. Chronic neck pain (Location of Secondary source of pain) (Bilateral) (L>R)   3. Cervical spondylosis   4. Cervicogenic headache (Left)   5. Acute postoperative pain   6. Disturbance of skin sensation   7. 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HCC)   8. Cervical spondylitis with radiculitis (HCC)   9. Chronic pain syndrome    Amber Acosta has either failed to respond, was unable to tolerate, or simply did not get enough benefit from other more conservative therapies including, but not limited to: 1. Over-the-counter medications 2. Anti-inflammatory medications 3. Muscle relaxants 4. Membrane stabilizers 5. Opioids 6. Physical therapy 7. Modalities (Heat, ice, etc.) 8. Invasive techniques such as nerve blocks. Amber Acosta has attained more than 50% relief of the pain from a series of diagnostic injections conducted in separate occasions.  Pain Score: Pre-procedure: 1  /10 Post-procedure: 0-No pain/10  Pre-op Assessment:  Previous date of service: 06/01/17 Service provided: Med Refill (Saw Dr Laban EmperorNaveira 03/05/17 med refill) Ms. Sweaney is a 66 y.o. (year old), female patient, seen today for interventional treatment. She  has a past surgical history that includes neck fusion; Breast surgery; Back surgery; Knee arthroscopy; Fracture surgery; Hand surgery; and Abdominal hysterectomy. Amber Acosta has a current medication list which includes the following prescription(s): albuterol, amlodipine, b-d ins syr ultrafine 1cc/30g, b-d ins syr ultrafine 1cc/31g, budesonide-formoterol, famotidine, fluoxetine, furosemide, hydralazine, hydrocodone-acetaminophen, klor-con 10, losartan, lovastatin, metoprolol succinate, nitroglycerin, oxycodone, oxycodone, oxycodone, pregabalin, optichamber diamond, tizanidine, trazodone, and insulin nph human, and the following Facility-Administered Medications: fentanyl and midazolam. Her primarily concern today is the Shoulder Pain (bilaterally- primarily left)  Initial Vital Signs: There were no vitals taken for this visit. BMI: Estimated body mass index is 40.57 kg/m as calculated from the following:   Height as of this encounter: 5\' 3"  (1.6 m).   Weight as of this encounter: 229 lb (103.9 kg).  Risk Assessment: Allergies: Reviewed. She is allergic to enbrel [etanercept]; metformin; methotrexate; mushroom extract complex; other; darvon [propoxyphene]; minocycline; and ciprofloxacin.  Allergy Precautions: None required Coagulopathies: Reviewed. None identified.  Blood-thinner therapy: None at this time Active Infection(s): Reviewed. None identified. Amber Acosta is afebrile  Site Confirmation: Amber Acosta was asked to confirm the procedure and laterality before marking the site Procedure checklist: Completed Consent: Before the procedure and under the influence of no sedative(s), amnesic(s), or anxiolytics, the patient was informed of the treatment  options, risks and possible complications. To fulfill our ethical and legal obligations, as recommended by the American Medical Association's Code of Ethics, I have informed the patient of my clinical impression; the nature and purpose of the treatment or procedure; the risks,  benefits, and possible complications of the intervention; the alternatives, including doing nothing; the risk(s) and benefit(s) of the alternative treatment(s) or procedure(s); and the risk(s) and benefit(s) of doing nothing. The patient was provided information about the general risks and possible complications associated with the procedure. These may include, but are not limited to: failure to achieve desired goals, infection, bleeding, organ or nerve damage, allergic reactions, paralysis, and death. In addition, the patient was informed of those risks and complications associated to Spine-related procedures, such as failure to decrease pain; infection (i.e.: Meningitis, epidural or intraspinal abscess); bleeding (i.e.: epidural hematoma, subarachnoid hemorrhage, or any other type of intraspinal or peri-dural bleeding); organ or nerve damage (i.e.: Any type of peripheral nerve, nerve root, or spinal cord injury) with subsequent damage to sensory, motor, and/or autonomic systems, resulting in permanent pain, numbness, and/or weakness of one or several areas of the body; allergic reactions; (i.e.: anaphylactic reaction); and/or death. Furthermore, the patient was informed of those risks and complications associated with the medications. These include, but are not limited to: allergic reactions (i.e.: anaphylactic or anaphylactoid reaction(s)); adrenal axis suppression; blood sugar elevation that in diabetics may result in ketoacidosis or comma; water retention that in patients with history of congestive heart failure may result in shortness of breath, pulmonary edema, and decompensation with resultant heart failure; weight gain; swelling or  edema; medication-induced neural toxicity; particulate matter embolism and blood vessel occlusion with resultant organ, and/or nervous system infarction; and/or aseptic necrosis of one or more joints. Finally, the patient was informed that Medicine is not an exact science; therefore, there is also the possibility of unforeseen or unpredictable risks and/or possible complications that may result in a catastrophic outcome. The patient indicated having understood very clearly. We have given the patient no guarantees and we have made no promises. Enough time was given to the patient to ask questions, all of which were answered to the patient's satisfaction. Ms. Fredlund has indicated that she wanted to continue with the procedure. Attestation: I, the ordering provider, attest that I have discussed with the patient the benefits, risks, side-effects, alternatives, likelihood of achieving goals, and potential problems during recovery for the procedure that I have provided informed consent. Date: 06/22/2017; Time: 10:17 AM  Pre-Procedure Preparation:  Monitoring: As per clinic protocol. Respiration, ETCO2, SpO2, BP, heart rate and rhythm monitor placed and checked for adequate function Safety Precautions: Patient was assessed for positional comfort and pressure points before starting the procedure. Time-out: I initiated and conducted the "Time-out" before starting the procedure, as per protocol. The patient was asked to participate by confirming the accuracy of the "Time Out" information. Verification of the correct person, site, and procedure were performed and confirmed by me, the nursing staff, and the patient. "Time-out" conducted as per Joint Commission's Universal Protocol (UP.01.01.01). "Time-out" Date & Time: 06/22/2017; 1203 hrs.  Description of Procedure Process:   Position: Prone with head of the table was raised to facilitate breathing. Target Area: For Cervical Facet block(s), the target is the  postero-lateral waist of the articular pillars at the C3, C4, C5, C6, & C7 levels. Approach: Posterior approach. Area Prepped: Entire Posterior Cervical Region Prepping solution: Hibiclens (4.0% Chlorhexidine gluconate solution) Safety Precautions: Aspiration looking for blood return was conducted prior to all injections. At no point did we inject any substances, as a needle was being advanced. Safe injection practices and needle disposal techniques used. Medications properly checked for expiration dates. SDV (single dose vial) medications used. Description of the Procedure:  Protocol guidelines were followed. The patient was placed in position over the fluoroscopy table. The target area was identified and the area prepped in the usual manner. Skin & deeper tissues infiltrated with local anesthetic. Appropriate amount of time allowed to pass for local anesthetics to take effect. The Radiofrequency needles were used to reach the area of the medial branch at the waist of the articular pillars using fluoroscopy. Using the [employer], sensory stimulation using 50 Hz was used to locate & identify the nerve, making sure that the needle was positioned such that there was no sensory stimulation below 0.3 V or above 0.7 V. Stimulation using 2 Hz was used to evaluate the motor component. Care was taken not to lesion any nerves that demonstrated motor stimulation of the lower extremities at an output of less than 2.5 times that of the sensory threshold, or a maximum of 2.0 V. Once satisfactory placement of the needles was achieved, the below described solution was slowly injected after negative aspiration. After waiting for at least 2 minutes, the ablation was performed at 80 degrees C for 60 seconds. The needles were then removed and the area cleansed, making sure to leave some of the prepping solution back to take advantage of its long term bactericidal properties. NOTE: We did notice that the  patient experienced a delay of approximately 4 seconds between setting the stimulation and actually failing this stimulus. This is of concern to me because of the possibility of posterior sensory column degradation, perhaps due to vitamin B12 deficiency. Due to this sensory abnormality, today I will be ordering vitamin B12 levels. I will also order a repeat Cervical MRI. The last one was done on 02/14/2015, before her C5-C7 ACDF. Intra-operative Compliance: Compliant Vitals:   06/22/17 1322 06/22/17 1329 06/22/17 1339 06/22/17 1349  BP: (!) 101/58 (!) 97/49 (!) 138/93 126/82  Pulse:      Resp: 13 15 14 17   Temp: (!) 96.5 F (35.8 C)     SpO2: 96% 93% 94% 96%  Weight:      Height:        Start Time: 1204 hrs. End Time: 1309 hrs. Materials & Medications:  Needle(s) Type: Teflon-coated, curved tip, Radiofrequency needle(s) Gauge: 22G Length: 10cm Medication(s): We administered lactated ringers, midazolam, fentaNYL, lidocaine, dexamethasone, and ropivacaine (PF) 2 mg/mL (0.2%). Please see chart orders for dosing details.  Imaging Guidance (Spinal):  Type of Imaging Technique: Fluoroscopy Guidance (Spinal) Indication(s): Assistance in needle guidance and placement for procedures requiring needle placement in or near specific anatomical locations not easily accessible without such assistance. Exposure Time: Please see nurses notes. Contrast: None used. Fluoroscopic Guidance: I was personally present during the use of fluoroscopy. "Tunnel Vision Technique" used to obtain the best possible view of the target area. Parallax error corrected before commencing the procedure. "Direction-depth-direction" technique used to introduce the needle under continuous pulsed fluoroscopy. Once target was reached, antero-posterior, oblique, and lateral fluoroscopic projection used confirm needle placement in all planes. Images permanently stored in EMR. Interpretation: No contrast injected. I personally  interpreted the imaging intraoperatively. Adequate needle placement confirmed in multiple planes. Permanent images saved into the patient's record.  Antibiotic Prophylaxis:  Indication(s): None identified Antibiotic given: None  Post-operative Assessment:  EBL: None Complications: No immediate post-treatment complications observed by team, or reported by patient. Note: The patient tolerated the entire procedure well. A repeat set of vitals were taken after the procedure and the patient was kept under observation following institutional policy, for this  type of procedure. Post-procedural neurological assessment was performed, showing return to baseline, prior to discharge. The patient was provided with post-procedure discharge instructions, including a section on how to identify potential problems. Should any problems arise concerning this procedure, the patient was given instructions to immediately contact us, at any time, without hesitation. In any case, we plan to contact the patient by telephone for a follow-up status report regarding this interventional procedure. Comments:  No additional relevant information.  Plan of Care  Disposition: Discharge home  Discharge Date & Time: 06/22/2017; 1357 hrs.  Physician-requested Follow-up:  Return for post-RF eval, (6 wks), by MD.  Future Appointments Date Time Provider Department Center  07/27/2017 9:00 AM Delano Metz, MD [facility] None  08/31/2017 8:45 AM Barbette Merino, NP [facility] None   Medications ordered for procedure: Meds ordered this encounter  Medications  . lactated ringers infusion 1,000 mL  . midazolam (VERSED) 5 MG/5ML injection 1-2 mg    Make sure Flumazenil is available in the pyxis when using this medication. If oversedation occurs, administer 0.2 mg IV over 15 sec. If after 45 sec no response, administer 0.2 mg again over 1 min; may repeat at 1 min intervals; not to exceed 4 doses (1 mg)  . fentaNYL (SUBLIMAZE)  injection 25-50 mcg    Make sure Narcan is available in the pyxis when using this medication. In the event of respiratory depression (RR< 8/min): Titrate NARCAN (naloxone) in increments of 0.1 to 0.2 mg IV at 2-3 minute intervals, until desired degree of reversal.  . lidocaine 1.5 % injection 20 mL    From block tray  . dexamethasone (DECADRON) injection 10 mg  . ropivacaine (PF) 2 mg/mL (0.2%) (NAROPIN) injection 9 mL  . HYDROcodone-acetaminophen (NORCO/VICODIN) 5-325 MG tablet    Sig: Take 1 tablet by mouth every 8 (eight) hours as needed for severe pain.    Dispense:  30 tablet    Refill:  0    For acute post-operative pain. Not to be refilled. To last until: 07/02/17   Medications administered: We administered lactated ringers, midazolam, fentaNYL, lidocaine, dexamethasone, and ropivacaine (PF) 2 mg/mL (0.2%).  See the medical record for exact dosing, route, and time of administration.  Lab-work, Procedure(s), & Referral(s) Ordered: Orders Placed This Encounter  Procedures  . DG C-Arm 1-60 Min-No Report  . MR CERVICAL SPINE WO CONTRAST  . Vitamin B12  . Magnesium  . Informed Consent Details: Transcribe to consent form and obtain patient signature  . Provider attestation of informed consent for procedure/surgical case  . Verify informed consent  . Discharge instructions  . Follow-up   Imaging Ordered: Results for orders placed in visit on 01/26/17  DG C-Arm 1-60 Min-No Report   Narrative Fluoroscopy was utilized by the requesting physician.  No radiographic  interpretation.    New Prescriptions   HYDROCODONE-ACETAMINOPHEN (NORCO/VICODIN) 5-325 MG TABLET    Take 1 tablet by mouth every 8 (eight) hours as needed for severe pain.   Primary Care Physician: Rayetta Humphrey, MD Location: [facility] Note by: Oswaldo Done, MD Date: 06/22/2017; Time: 1:49 PM  Disclaimer:  Medicine is not an Visual merchandiser. The only guarantee in medicine is  that nothing is guaranteed. It is important to note that the decision to proceed with this intervention was based on the information collected from the patient. The Data and conclusions were drawn from the patient's questionnaire, the interview, and the physical examination. Because the information was provided in  large part by the patient, it cannot be guaranteed that it has not been purposely or unconsciously manipulated. Every effort has been made to obtain as much relevant data as possible for this evaluation. It is important to note that the conclusions that lead to this procedure are derived in large part from the available data. Always take into account that the treatment will also be dependent on availability of resources and existing treatment guidelines, considered by other Pain Management Practitioners as being common knowledge and practice, at the time of the intervention. For Medico-Legal purposes, it is also important to point out that variation in procedural techniques and pharmacological choices are the acceptable norm. The indications, contraindications, technique, and results of the above procedure should only be interpreted and judged by a Board-Certified Interventional Pain Specialist with extensive familiarity and expertise in the same exact procedure and technique.  Instructions provided at this appointment: Patient Instructions   ____________________________________________________________________________________________  Post-Procedure instructions Instructions:  Apply ice: Fill a plastic sandwich bag with crushed ice. Cover it with a small towel and apply to injection site. Apply for 15 minutes then remove x 15 minutes. Repeat sequence on day of procedure, until you go to bed. The purpose is to minimize swelling and discomfort after procedure.  Apply heat: Apply heat to procedure site starting the day following the procedure. The purpose is to treat any soreness and discomfort  from the procedure.  Food intake: Start with clear liquids (like water) and advance to regular food, as tolerated.   Physical activities: Keep activities to a minimum for the first 8 hours after the procedure.   Driving: If you have received any sedation, you are not allowed to drive for 24 hours after your procedure.  Blood thinner: Restart your blood thinner 6 hours after your procedure. (Only for those taking blood thinners)  Insulin: As soon as you can eat, you may resume your normal dosing schedule. (Only for those taking insulin)  Infection prevention: Keep procedure site clean and dry.  Post-procedure Pain Diary: Extremely important that this be done correctly and accurately. Recorded information will be used to determine the next step in treatment.  Pain evaluated is that of treated area only. Do not include pain from an untreated area.  Complete every hour, on the hour, for the initial 8 hours. Set an alarm to help you do this part accurately.  Do not go to sleep and have it completed later. It will not be accurate.  Follow-up appointment: Keep your follow-up appointment after the procedure. Usually 2 weeks for most procedures. (6 weeks in the case of radiofrequency.) Bring you pain diary.  Expect:  From numbing medicine (AKA: Local Anesthetics): Numbness or decrease in pain.  Onset: Full effect within 15 minutes of injected.  Duration: It will depend on the type of local anesthetic used. On the average, 1 to 8 hours.   From steroids: Decrease in swelling or inflammation. Once inflammation is improved, relief of the pain will follow.  Onset of benefits: Depends on the amount of swelling present. The more swelling, the longer it will take for the benefits to be seen. In some cases, up to 10 days.  Duration: Steroids will stay in the system x 2 weeks. Duration of benefits will depend on multiple posibilities including persistent irritating factors.  From procedure: Some  discomfort is to be expected once the numbing medicine wears off. This should be minimal if ice and heat are applied as instructed. Call if:  You experience  numbness and weakness that gets worse with time, as opposed to wearing off.  New onset bowel or bladder incontinence. (Spinal procedures only)  Emergency Numbers:  Durning business hours (Monday - Thursday, 8:00 AM - 4:00 PM) (Friday, 9:00 AM - 12:00 Noon): [phone]  After hours: [phone] ____________________________________________________________________________________________  Pain Management Discharge Instructions  General Discharge Instructions :  If you need to reach your doctor call: Monday-Friday 8:00 am - 4:00 pm at [phone] or toll free [phone].  After clinic hours [phone] to have operator reach doctor.  Bring all of your medication bottles to all your appointments in the pain clinic.  To cancel or reschedule your appointment with Pain Management please remember to call 24 hours in advance to avoid a fee.  Refer to the educational materials which you have been given on: General Risks, I had my Procedure. Discharge Instructions, Post Sedation.  Post Procedure Instructions:  The drugs you were given will stay in your system until tomorrow, so for the next 24 hours you should not drive, make any legal decisions or drink any alcoholic beverages.  You may eat anything you prefer, but it is better to start with liquids then soups and crackers, and gradually work up to solid foods.  Please notify your doctor immediately if you have any unusual bleeding, trouble breathing or pain that is not related to your normal pain.  Depending on the type of procedure that was done, some parts of your body may feel week and/or numb.  This usually clears up by tonight or the next day.  Walk with the use of an assistive device or accompanied by an adult for the 24 hours.  You may use ice on the affected area  for the first 24 hours.  Put ice in a Ziploc bag and cover with a towel and place against area 15 minutes on 15 minutes off.  You may switch to heat after 24 hours.GENERAL RISKS AND COMPLICATIONS  What are the risk, side effects and possible complications? Generally speaking, most procedures are safe.  However, with any procedure there are risks, side effects, and the possibility of complications.  The risks and complications are dependent upon the sites that are lesioned, or the type of nerve block to be performed.  The closer the procedure is to the spine, the more serious the risks are.  Great care is taken when placing the radio frequency needles, block needles or lesioning probes, but sometimes complications can occur. 1. Infection: Any time there is an injection through the skin, there is a risk of infection.  This is why sterile conditions are used for these blocks.  There are four possible types of infection. 1. Localized skin infection. 2. Central Nervous System Infection-This can be in the form of Meningitis, which can be deadly. 3. Epidural Infections-This can be in the form of an epidural abscess, which can cause pressure inside of the spine, causing compression of the spinal cord with subsequent paralysis. This would require an emergency surgery to decompress, and there are no guarantees that the patient would recover from the paralysis. 4. Discitis-This is an infection of the intervertebral discs.  It occurs in about 1% of discography procedures.  It is difficult to treat and it may lead to surgery.        2. Pain: the needles have to go through skin and soft tissues, will cause soreness.       3. Damage to internal structures:  The nerves to be lesioned  may be near blood vessels or    other nerves which can be potentially damaged.       4. Bleeding: Bleeding is more common if the patient is taking blood thinners such as  aspirin, Coumadin, Ticiid, Plavix, etc., or if he/she have some  genetic predisposition  such as hemophilia. Bleeding into the spinal canal can cause compression of the spinal  cord with subsequent paralysis.  This would require an emergency surgery to  decompress and there are no guarantees that the patient would recover from the  paralysis.       5. Pneumothorax:  Puncturing of a lung is a possibility, every time a needle is introduced in  the area of the chest or upper back.  Pneumothorax refers to free air around the  collapsed lung(s), inside of the thoracic cavity (chest cavity).  Another two possible  complications related to a similar event would include: Hemothorax and Chylothorax.   These are variations of the Pneumothorax, where instead of air around the collapsed  lung(s), you may have blood or chyle, respectively.       6. Spinal headaches: They may occur with any procedures in the area of the spine.       7. Persistent CSF (Cerebro-Spinal Fluid) leakage: This is a rare problem, but may occur  with prolonged intrathecal or epidural catheters either due to the formation of a fistulous  track or a dural tear.       8. Nerve damage: By working so close to the spinal cord, there is always a possibility of  nerve damage, which could be as serious as a permanent spinal cord injury with  paralysis.       9. Death:  Although rare, severe deadly allergic reactions known as "Anaphylactic  reaction" can occur to any of the medications used.      10. Worsening of the symptoms:  We can always make thing worse.  What are the chances of something like this happening? Chances of any of this occuring are extremely low.  By statistics, you have more of a chance of getting killed in a motor vehicle accident: while driving to the hospital than any of the above occurring .  Nevertheless, you should be aware that they are possibilities.  In general, it is similar to taking a shower.  Everybody knows that you can slip, hit your head and get killed.  Does that mean that you should  not shower again?  Nevertheless always keep in mind that statistics do not mean anything if you happen to be on the wrong side of them.  Even if a procedure has a 1 (one) in a 1,000,000 (million) chance of going wrong, it you happen to be that one..Also, keep in mind that by statistics, you have more of a chance of having something go wrong when taking medications.  Who should not have this procedure? If you are on a blood thinning medication (e.g. Coumadin, Plavix, see list of "Blood Thinners"), or if you have an active infection going on, you should not have the procedure.  If you are taking any blood thinners, please inform your physician.  How should I prepare for this procedure?  Do not eat or drink anything at least six hours prior to the procedure.  Bring a driver with you .  It cannot be a taxi.  Come accompanied by an adult that can drive you back, and that is strong enough to help you if your legs get  weak or numb from the local anesthetic.  Take all of your medicines the morning of the procedure with just enough water to swallow them.  If you have diabetes, make sure that you are scheduled to have your procedure done first thing in the morning, whenever possible.  If you have diabetes, take only half of your insulin dose and notify our nurse that you have done so as soon as you arrive at the clinic.  If you are diabetic, but only take blood sugar pills (oral hypoglycemic), then do not take them on the morning of your procedure.  You may take them after you have had the procedure.  Do not take aspirin or any aspirin-containing medications, at least eleven (11) days prior to the procedure.  They may prolong bleeding.  Wear loose fitting clothing that may be easy to take off and that you would not mind if it got stained with Betadine or blood.  Do not wear any jewelry or perfume  Remove any nail coloring.  It will interfere with some of our monitoring equipment.  NOTE: Remember  that this is not meant to be interpreted as a complete list of all possible complications.  Unforeseen problems may occur.  BLOOD THINNERS The following drugs contain aspirin or other products, which can cause increased bleeding during surgery and should not be taken for 2 weeks prior to and 1 week after surgery.  If you should need take something for relief of minor pain, you may take acetaminophen which is found in Tylenol,m Datril, Anacin-3 and Panadol. It is not blood thinner. The products listed below are.  Do not take any of the products listed below in addition to any listed on your instruction sheet.  A.P.C or A.P.C with Codeine Codeine Phosphate Capsules #3 Ibuprofen Ridaura  ABC compound Congesprin Imuran rimadil  Advil Cope Indocin Robaxisal  Alka-Seltzer Effervescent Pain Reliever and Antacid Coricidin or Coricidin-D  Indomethacin Rufen  Alka-Seltzer plus Cold Medicine Cosprin Ketoprofen S-A-C Tablets  Anacin Analgesic Tablets or Capsules Coumadin Korlgesic Salflex  Anacin Extra Strength Analgesic tablets or capsules CP-2 Tablets Lanoril Salicylate  Anaprox Cuprimine Capsules Levenox Salocol  Anexsia-D Dalteparin Magan Salsalate  Anodynos Darvon compound Magnesium Salicylate Sine-off  Ansaid Dasin Capsules Magsal Sodium Salicylate  Anturane Depen Capsules Marnal Soma  APF Arthritis pain formula Dewitt's Pills Measurin Stanback  Argesic Dia-Gesic Meclofenamic Sulfinpyrazone  Arthritis Bayer Timed Release Aspirin Diclofenac Meclomen Sulindac  Arthritis pain formula Anacin Dicumarol Medipren Supac  Analgesic (Safety coated) Arthralgen Diffunasal Mefanamic Suprofen  Arthritis Strength Bufferin Dihydrocodeine Mepro Compound Suprol  Arthropan liquid Dopirydamole Methcarbomol with Aspirin Synalgos  ASA tablets/Enseals Disalcid Micrainin Tagament  Ascriptin Doan's Midol Talwin  Ascriptin A/D Dolene Mobidin Tanderil  Ascriptin Extra Strength Dolobid Moblgesic Ticlid  Ascriptin with  Codeine Doloprin or Doloprin with Codeine Momentum Tolectin  Asperbuf Duoprin Mono-gesic Trendar  Aspergum Duradyne Motrin or Motrin IB Triminicin  Aspirin plain, buffered or enteric coated Durasal Myochrisine Trigesic  Aspirin Suppositories Easprin Nalfon Trillsate  Aspirin with Codeine Ecotrin Regular or Extra Strength Naprosyn Uracel  Atromid-S Efficin Naproxen Ursinus  Auranofin Capsules Elmiron Neocylate Vanquish  Axotal Emagrin Norgesic Verin  Azathioprine Empirin or Empirin with Codeine Normiflo Vitamin E  Azolid Emprazil Nuprin Voltaren  Bayer Aspirin plain, buffered or children's or timed BC Tablets or powders Encaprin Orgaran Warfarin Sodium  Buff-a-Comp Enoxaparin Orudis Zorpin  Buff-a-Comp with Codeine Equegesic Os-Cal-Gesic   Buffaprin Excedrin plain, buffered or Extra Strength Oxalid   Bufferin Arthritis Strength Feldene Oxphenbutazone  Bufferin plain or Extra Strength Feldene Capsules Oxycodone with Aspirin   Bufferin with Codeine Fenoprofen Fenoprofen Pabalate or Pabalate-SF   Buffets II Flogesic Panagesic   Buffinol plain or Extra Strength Florinal or Florinal with Codeine Panwarfarin   Buf-Tabs Flurbiprofen Penicillamine   Butalbital Compound Four-way cold tablets Penicillin   Butazolidin Fragmin Pepto-Bismol   Carbenicillin Geminisyn Percodan   Carna Arthritis Reliever Geopen Persantine   Carprofen Gold's salt Persistin   Chloramphenicol Goody's Phenylbutazone   Chloromycetin Haltrain Piroxlcam   Clmetidine heparin Plaquenil   Cllnoril Hyco-pap Ponstel   Clofibrate Hydroxy chloroquine Propoxyphen         Before stopping any of these medications, be sure to consult the physician who ordered them.  Some, such as Coumadin (Warfarin) are ordered to prevent or treat serious conditions such as "deep thrombosis", "pumonary embolisms", and other heart problems.  The amount of time that you may need off of the medication may also vary with the medication and the reason for  which you were taking it.  If you are taking any of these medications, please make sure you notify your pain physician before you undergo any procedures.

## 2017-06-23 ENCOUNTER — Telehealth: Payer: Self-pay | Admitting: *Deleted

## 2017-06-23 LAB — VITAMIN B12: VITAMIN B 12: 628 pg/mL (ref 232–1245)

## 2017-06-23 LAB — MAGNESIUM: MAGNESIUM: 2.1 mg/dL (ref 1.6–2.3)

## 2017-06-23 NOTE — Telephone Encounter (Signed)
Attempted to call for post procedure follow-up. Unable to leave a message, mailbox full. 

## 2017-07-06 ENCOUNTER — Ambulatory Visit
Admission: RE | Admit: 2017-07-06 | Discharge: 2017-07-06 | Disposition: A | Discharge status: Home / Self Care | Payer: Medicare Other | Source: Ambulatory Visit | Attending: Pain Medicine | Admitting: Pain Medicine

## 2017-07-06 DIAGNOSIS — G8929 Other chronic pain: Secondary | ICD-10-CM

## 2017-07-06 DIAGNOSIS — M4692 Unspecified inflammatory spondylopathy, cervical region: Secondary | ICD-10-CM

## 2017-07-06 DIAGNOSIS — G894 Chronic pain syndrome: Secondary | ICD-10-CM | POA: Diagnosis not present

## 2017-07-06 DIAGNOSIS — M549 Dorsalgia, unspecified: Secondary | ICD-10-CM | POA: Insufficient documentation

## 2017-07-06 DIAGNOSIS — Z981 Arthrodesis status: Secondary | ICD-10-CM | POA: Diagnosis not present

## 2017-07-06 DIAGNOSIS — M4722 Other spondylosis with radiculopathy, cervical region: Secondary | ICD-10-CM | POA: Insufficient documentation

## 2017-07-06 DIAGNOSIS — M4322 Fusion of spine, cervical region: Secondary | ICD-10-CM

## 2017-07-06 DIAGNOSIS — M47892 Other spondylosis, cervical region: Secondary | ICD-10-CM | POA: Insufficient documentation

## 2017-07-06 DIAGNOSIS — M542 Cervicalgia: Secondary | ICD-10-CM

## 2017-07-06 DIAGNOSIS — M5023 Other cervical disc displacement, cervicothoracic region: Secondary | ICD-10-CM | POA: Insufficient documentation

## 2017-07-06 DIAGNOSIS — R51 Headache: Secondary | ICD-10-CM | POA: Insufficient documentation

## 2017-07-06 DIAGNOSIS — M5412 Radiculopathy, cervical region: Secondary | ICD-10-CM

## 2017-07-06 DIAGNOSIS — M47812 Spondylosis without myelopathy or radiculopathy, cervical region: Secondary | ICD-10-CM

## 2017-07-06 DIAGNOSIS — M4682 Other specified inflammatory spondylopathies, cervical region: Secondary | ICD-10-CM | POA: Insufficient documentation

## 2017-07-06 DIAGNOSIS — G4486 Cervicogenic headache: Secondary | ICD-10-CM

## 2017-07-27 ENCOUNTER — Ambulatory Visit: Payer: Medicare Other | Attending: Pain Medicine | Admitting: Pain Medicine

## 2017-07-27 ENCOUNTER — Encounter: Payer: Self-pay | Admitting: Pain Medicine

## 2017-07-27 VITALS — BP 147/59 | HR 68 | Temp 98.1°F | Resp 16 | Ht 63.0 in | Wt 225.0 lb

## 2017-07-27 DIAGNOSIS — E1140 Type 2 diabetes mellitus with diabetic neuropathy, unspecified: Secondary | ICD-10-CM | POA: Insufficient documentation

## 2017-07-27 DIAGNOSIS — Z825 Family history of asthma and other chronic lower respiratory diseases: Secondary | ICD-10-CM | POA: Insufficient documentation

## 2017-07-27 DIAGNOSIS — Z6839 Body mass index (BMI) 39.0-39.9, adult: Secondary | ICD-10-CM | POA: Diagnosis not present

## 2017-07-27 DIAGNOSIS — Z8249 Family history of ischemic heart disease and other diseases of the circulatory system: Secondary | ICD-10-CM | POA: Diagnosis not present

## 2017-07-27 DIAGNOSIS — Z888 Allergy status to other drugs, medicaments and biological substances status: Secondary | ICD-10-CM | POA: Insufficient documentation

## 2017-07-27 DIAGNOSIS — M25511 Pain in right shoulder: Secondary | ICD-10-CM | POA: Diagnosis not present

## 2017-07-27 DIAGNOSIS — M1990 Unspecified osteoarthritis, unspecified site: Secondary | ICD-10-CM | POA: Diagnosis not present

## 2017-07-27 DIAGNOSIS — I5032 Chronic diastolic (congestive) heart failure: Secondary | ICD-10-CM | POA: Diagnosis not present

## 2017-07-27 DIAGNOSIS — Z811 Family history of alcohol abuse and dependence: Secondary | ICD-10-CM | POA: Insufficient documentation

## 2017-07-27 DIAGNOSIS — F3341 Major depressive disorder, recurrent, in partial remission: Secondary | ICD-10-CM | POA: Diagnosis not present

## 2017-07-27 DIAGNOSIS — L409 Psoriasis, unspecified: Secondary | ICD-10-CM | POA: Insufficient documentation

## 2017-07-27 DIAGNOSIS — Z87891 Personal history of nicotine dependence: Secondary | ICD-10-CM | POA: Insufficient documentation

## 2017-07-27 DIAGNOSIS — D696 Thrombocytopenia, unspecified: Secondary | ICD-10-CM

## 2017-07-27 DIAGNOSIS — M797 Fibromyalgia: Secondary | ICD-10-CM | POA: Insufficient documentation

## 2017-07-27 DIAGNOSIS — D860 Sarcoidosis of lung: Secondary | ICD-10-CM | POA: Diagnosis not present

## 2017-07-27 DIAGNOSIS — Z79891 Long term (current) use of opiate analgesic: Secondary | ICD-10-CM | POA: Insufficient documentation

## 2017-07-27 DIAGNOSIS — N183 Chronic kidney disease, stage 3 unspecified: Secondary | ICD-10-CM

## 2017-07-27 DIAGNOSIS — M549 Dorsalgia, unspecified: Secondary | ICD-10-CM | POA: Diagnosis not present

## 2017-07-27 DIAGNOSIS — M545 Low back pain: Secondary | ICD-10-CM | POA: Diagnosis not present

## 2017-07-27 DIAGNOSIS — Z79899 Other long term (current) drug therapy: Secondary | ICD-10-CM | POA: Insufficient documentation

## 2017-07-27 DIAGNOSIS — I130 Hypertensive heart and chronic kidney disease with heart failure and stage 1 through stage 4 chronic kidney disease, or unspecified chronic kidney disease: Secondary | ICD-10-CM | POA: Diagnosis not present

## 2017-07-27 DIAGNOSIS — G894 Chronic pain syndrome: Secondary | ICD-10-CM | POA: Insufficient documentation

## 2017-07-27 DIAGNOSIS — F514 Sleep terrors [night terrors]: Secondary | ICD-10-CM | POA: Insufficient documentation

## 2017-07-27 DIAGNOSIS — Z833 Family history of diabetes mellitus: Secondary | ICD-10-CM | POA: Diagnosis not present

## 2017-07-27 DIAGNOSIS — Z794 Long term (current) use of insulin: Secondary | ICD-10-CM | POA: Insufficient documentation

## 2017-07-27 DIAGNOSIS — E559 Vitamin D deficiency, unspecified: Secondary | ICD-10-CM | POA: Insufficient documentation

## 2017-07-27 DIAGNOSIS — G8929 Other chronic pain: Secondary | ICD-10-CM | POA: Diagnosis not present

## 2017-07-27 DIAGNOSIS — M4312 Spondylolisthesis, cervical region: Secondary | ICD-10-CM | POA: Insufficient documentation

## 2017-07-27 DIAGNOSIS — M542 Cervicalgia: Secondary | ICD-10-CM | POA: Diagnosis not present

## 2017-07-27 DIAGNOSIS — Z981 Arthrodesis status: Secondary | ICD-10-CM | POA: Diagnosis not present

## 2017-07-27 DIAGNOSIS — R209 Unspecified disturbances of skin sensation: Secondary | ICD-10-CM | POA: Diagnosis not present

## 2017-07-27 DIAGNOSIS — M810 Age-related osteoporosis without current pathological fracture: Secondary | ICD-10-CM | POA: Insufficient documentation

## 2017-07-27 DIAGNOSIS — M47812 Spondylosis without myelopathy or radiculopathy, cervical region: Secondary | ICD-10-CM | POA: Insufficient documentation

## 2017-07-27 DIAGNOSIS — Z7951 Long term (current) use of inhaled steroids: Secondary | ICD-10-CM | POA: Insufficient documentation

## 2017-07-27 DIAGNOSIS — M25512 Pain in left shoulder: Secondary | ICD-10-CM

## 2017-07-27 DIAGNOSIS — K219 Gastro-esophageal reflux disease without esophagitis: Secondary | ICD-10-CM | POA: Diagnosis not present

## 2017-07-27 DIAGNOSIS — Z5181 Encounter for therapeutic drug level monitoring: Secondary | ICD-10-CM | POA: Insufficient documentation

## 2017-07-27 DIAGNOSIS — M5022 Other cervical disc displacement, mid-cervical region, unspecified level: Secondary | ICD-10-CM | POA: Diagnosis not present

## 2017-07-27 DIAGNOSIS — E1122 Type 2 diabetes mellitus with diabetic chronic kidney disease: Secondary | ICD-10-CM | POA: Insufficient documentation

## 2017-07-27 DIAGNOSIS — Z881 Allergy status to other antibiotic agents status: Secondary | ICD-10-CM | POA: Insufficient documentation

## 2017-07-27 DIAGNOSIS — R790 Abnormal level of blood mineral: Secondary | ICD-10-CM

## 2017-07-27 DIAGNOSIS — M6281 Muscle weakness (generalized): Secondary | ICD-10-CM | POA: Insufficient documentation

## 2017-07-27 NOTE — Progress Notes (Signed)
Patient's Name: Amber Acosta  MRN: [id number]  Referring Provider: Sharyne Peach, MD  DOB: 1951/06/04  PCP: Sharyne Peach, MD  DOS: 07/27/2017  Note by: Gaspar Cola, MD  Service setting: Ambulatory outpatient  Specialty: Interventional Pain Management  Location: [facility] (AMB) Pain Management Facility    Patient type: Established   Primary Reason(s) for Visit: Encounter for post-procedure evaluation of chronic illness with mild to moderate exacerbation CC: Neck Pain  HPI  [name] is a 66 y.o. year old, female patient, who comes today for a post-procedure evaluation. She has Cervical vertebral fusion (Uncomplicated C5 through C7 ACDF.); Fibromyalgia; Lumbar facet joint pain; Lumbar facet syndrome (Bilateral) (R>L); Chronic kidney disease; COPD (chronic obstructive pulmonary disease) ([place]); Depression; Type 2 diabetes mellitus ([place]); Hypertensive heart disease with congestive heart failure ([place]); Gall bladder polyp; GERD (gastroesophageal reflux disease); Hyperlipemia; Inflammatory polyarthropathy ([place]); Sleep terror; Obesity, Class II, BMI 35-39.9, with comorbidity; OP (osteoporosis); Psoriasis; Kidney cysts; Muscle weakness (generalized); Sarcoidosis; Thrombocytopenia ([place]); Vitamin D deficiency; Chronic neck pain (Secondary source of pain) (Bilateral) (L>R); Cervicogenic headache (Left); Cervical facet syndrome (Left); At risk for falls; Balance problems; History of closed head injury; Opiate use (22.5 MME/Day); Long term current use of opiate analgesic; Encounter for therapeutic drug level monitoring; Long term prescription opiate use; Spondylolisthesis of cervical region; 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); Chronic upper back pain; Chronic Occipital neuralgia (Left); Epigastric pain; Displacement of cervical intervertebral disc without myelopathy; Essential hypertension; Hypertension; Morbid obesity with BMI of 40.0-44.9, adult ([place]); Recurrent major depressive disorder, in partial remission ([place]); Right arm weakness; Acute postoperative pain; Disturbance of skin sensation; Cervical spondylitis with radiculitis (HCC); and Chronic shoulder pain (Tertiary source of pain) (Bilateral) (L>R) on her problem list. Her primarily concern today is the Neck Pain  Pain Assessment: Location: Left Neck Radiating: radiates into shoulder Onset: More than a month ago Duration: Chronic pain Quality: Aching, Radiating, Burning Severity: 0-No pain/10 (self-reported pain score)  Note: Reported level is compatible with observation.                   Timing: Intermittent Modifying factors: rest  [name] comes in today for post-procedure evaluation after the treatment done on 06/22/2017.  Further details on both, my assessment(s), as well as the proposed treatment plan, please see below.  Post-Procedure Assessment  06/22/2017 Procedure: Left sided cervical facet RFA under fluoroscopic guidance and IV sedation Pre-procedure pain score:  1/10 Post-procedure pain score: 0/10 (100% relief) Influential Factors: BMI: 39.86 kg/m Intra-procedural challenges: None observed.         Assessment challenges: None detected.              Reported side-effects: None.        Post-procedural adverse reactions or complications: None reported         Sedation: Sedation provided. When no sedatives are used, the analgesic levels obtained are directly associated to the effectiveness of the local anesthetics. However, when sedation is provided, the  level of analgesia obtained during the initial 1 hour following the intervention, is believed to be the result of a combination of factors. These factors may  include, but are not limited to: 1. The effectiveness of the local anesthetics used. 2. The effects of the analgesic(s) and/or anxiolytic(s) used. 3. The degree of discomfort experienced by the patient at the time of the procedure. 4. The patients ability and reliability in recalling and recording the events. 5. The presence and influence of possible secondary gains and/or psychosocial factors. Reported result: Relief experienced during the 1st hour after the procedure: 100 % (Ultra-Short Term Relief)            Interpretative annotation: Clinically appropriate result. Analgesia during this period is likely to be Local Anesthetic and/or IV Sedative (Analgesic/Anxiolytic) related.          Effects of local anesthetic: The analgesic effects attained during this period are directly associated to the localized infiltration of local anesthetics and therefore cary significant diagnostic value as to the etiological location, or anatomical origin, of the pain. Expected duration of relief is directly dependent on the pharmacodynamics of the local anesthetic used. Long-acting (4-6 hours) anesthetics used.  Reported result: Relief during the next 4 to 6 hour after the procedure: 100 % (Short-Term Relief)            Interpretative annotation: Clinically appropriate result. Analgesia during this period is likely to be Local Anesthetic-related.          Long-term benefit: Defined as the period of time past the expected duration of local anesthetics (1 hour for short-acting and 4-6 hours for long-acting). With the possible exception of prolonged sympathetic blockade from the local anesthetics, benefits during this period are typically attributed to, or associated with, other factors such as analgesic sensory neuropraxia, antiinflammatory effects, or beneficial  biochemical changes provided by agents other than the local anesthetics.  Reported result: Extended relief following procedure: 75 % (has had 1 headache since injection) (Long-Term Relief)            Interpretative annotation: Clinically appropriate result. Good relief. No permanent benefit expected. Inflammation plays a part in the etiology to the pain. Benefit could signal adequate RF ablation.  Current benefits: Defined as persistent relief that continues at this point in time.   Reported results: Treated area: 75 % Ms. Snowball reports improvement in function Interpretative annotation: Good relief. No permanent benefit expected. Effective therapeutic approach. Benefit could signal adequate RF ablation.  Interpretation: Results would suggest a successful intervention.                  Plan:  Set up procedure as a PRN palliative treatment option for this patient.  Laboratory Chemistry  Inflammation Markers (CRP: Acute Phase) (ESR: Chronic Phase) Lab Results  Component Value Date   CRP 2.1 (H) 03/26/2016   ESRSEDRATE 52 (H) 03/26/2016                 Renal Function Markers Lab Results  Component Value Date   BUN 14 03/26/2016   CREATININE 1.28 (H) 03/26/2016   GFRAA 50 (L) 03/26/2016   GFRNONAA 44 (L) 03/26/2016                 Hepatic Function Markers Lab Results  Component Value Date   AST 24 03/26/2016   ALT 20 03/26/2016   ALBUMIN 4.0 03/26/2016   ALKPHOS 70 03/26/2016                 Electrolytes Lab Results  Component Value Date   NA 136 03/26/2016   K 3.8 03/26/2016   CL 100 (L) 03/26/2016  CALCIUM 8.5 (L) 03/26/2016   MG 2.1 06/22/2017                 Neuropathy Markers Lab Results  Component Value Date   VITAMINB12 628 06/22/2017                 Bone Pathology Markers Lab Results  Component Value Date   ALKPHOS 70 03/26/2016   VD25OH 20.2 (L) 03/26/2016   CALCIUM 8.5 (L) 03/26/2016                 Coagulation Parameters Lab Results  Component  Value Date   PLT 146 (L) 03/26/2016                 Cardiovascular Markers No results found for: BNP, HGB, HCT               Note: Lab results reviewed.  Recent Diagnostic Imaging Review  Mr Cervical Spine Wo Contrast  Result Date: 07/06/2017 CLINICAL DATA:  Chronic cervical spine pain. Cervical radiculitis. Previous fusion at C5-6 and C6-7. EXAM: MRI CERVICAL SPINE WITHOUT CONTRAST TECHNIQUE: Multiplanar, multisequence MR imaging of the cervical spine was performed. No intravenous contrast was administered. COMPARISON:  CT scan dated 08/14/2015 FINDINGS: Alignment: Physiologic. Vertebrae: Solid anterior fusion at C5-6 and C6-7. Cord: Normal signal and morphology. Posterior Fossa, vertebral arteries, paraspinal tissues: Negative. Disc levels: Craniocervical junction through C2-3: There is fusion of the C2 and C3 vertebral bodies as well as of the left facet joint at C2-3. There is no disc bulging or protrusion. C3-4:  Moderate left facet arthritis.  The disc is normal. C4-5: Normal disc. Mild to moderate left facet arthritis. Widely patent neural foramina. C5-6 and C6-7: Solid anterior interbody fusion at each level with no residual impingement. C7-T1: Tiny central disc bulge with no neural impingement. Widely patent neural foramina. T1-2:  Normal. T2-3: Benign hemangioma in the T2 vertebral body. Disc space narrowing with a small broad-based disc bulge without neural impingement. Widely patent neural foramina. IMPRESSION: 1. Facet arthritis at C3-4 and C4-5 on the left. 2. Solid anterior interbody fusions at C5-6 and C6-7. 3. Auto fusion at C2-3. 4. Tiny central disc bulge at C7-T1 with no neural impingement. Electronically Signed   By: Lorriane Shire M.D.   On: 07/06/2017 11:10   Note: Imaging results reviewed.          Meds   Current Meds  Medication Sig  . albuterol (PROVENTIL HFA;VENTOLIN HFA) 108 (90 Base) MCG/ACT inhaler Inhale 2 puffs into the lungs every 6 (six) hours as needed.   Marland Kitchen  amLODipine (NORVASC) 10 MG tablet Take 10 mg by mouth daily.   . B-D INS SYR ULTRAFINE 1CC/30G 30G X 1/2" 1 ML MISC   . B-D INS SYR ULTRAFINE 1CC/31G 31G X 5/16" 1 ML MISC   . budesonide-formoterol (SYMBICORT) 160-4.5 MCG/ACT inhaler Inhale 2 puffs into the lungs 2 (two) times daily.   . famotidine (PEPCID) 20 MG tablet   . FLUoxetine (PROZAC) 40 MG capsule Take 40 mg by mouth daily.   . furosemide (LASIX) 40 MG tablet Take 40 mg by mouth daily.  . hydrALAZINE (APRESOLINE) 25 MG tablet Take 25 mg by mouth 2 (two) times daily.   . insulin NPH Human (HUMULIN N,NOVOLIN N) 100 UNIT/ML injection NPH inject 70 in the am prior to breakfast, and 50 units SQ  Units prior dinner.  . Insulin Syringe-Needle U-100 (INSULIN SYRINGE 1CC/31GX5/16") 31G X 5/16" 1 ML MISC Use once  daily as directed by physician.  Marland Kitchen KLOR-CON 10 10 MEQ tablet Take 10 mEq by mouth daily.   Marland Kitchen losartan (COZAAR) 100 MG tablet TAKE 1 TABLET (100 MG TOTAL) BY MOUTH ONCE DAILY.  Marland Kitchen lovastatin (MEVACOR) 10 MG tablet Take 10 mg by mouth at bedtime.  . metoprolol succinate (TOPROL-XL) 50 MG 24 hr tablet Take 25 mg by mouth daily.   . nitroGLYCERIN (NITROSTAT) 0.3 MG SL tablet 1 tablet sublingual prn for ESOPHAGEAL SPASMS(may repeat every 5 min, seek med help if pain persists after 3 tablets)  . oxyCODONE (OXY IR/ROXICODONE) 5 MG immediate release tablet Take 1 tablet (5 mg total) by mouth every 8 (eight) hours as needed for moderate pain or severe pain.  Derrill Memo ON 08/08/2017] oxyCODONE (OXY IR/ROXICODONE) 5 MG immediate release tablet Take 1 tablet (5 mg total) by mouth every 8 (eight) hours as needed for moderate pain or severe pain.  Derrill Memo ON 09/07/2017] oxyCODONE (OXY IR/ROXICODONE) 5 MG immediate release tablet Take 1 tablet (5 mg total) by mouth every 8 (eight) hours as needed for moderate pain or severe pain.  . pregabalin (LYRICA) 150 MG capsule Take 1 capsule (150 mg total) by mouth 2 (two) times daily.  Marland Kitchen Spacer/Aero-Holding Chambers  ([place]) MISC See admin instructions.  Marland Kitchen tiZANidine (ZANAFLEX) 4 MG tablet Take 1 tablet (4 mg total) by mouth 2 (two) times daily as needed for muscle spasms.  . traZODone (DESYREL) 100 MG tablet Take 200 mg by mouth at bedtime.   . traZODone (DESYREL) 100 MG tablet Take by mouth.    ROS  Constitutional: Denies any fever or chills Gastrointestinal: No reported hemesis, hematochezia, vomiting, or acute GI distress Musculoskeletal: Denies any acute onset joint swelling, redness, loss of ROM, or weakness Neurological: No reported episodes of acute onset apraxia, aphasia, dysarthria, agnosia, amnesia, paralysis, loss of coordination, or loss of consciousness  Allergies  Ms. Chenevert is allergic to enbrel [etanercept]; metformin; methotrexate; mushroom extract complex; other; darvon [propoxyphene]; minocycline; and ciprofloxacin.  PFSH  Drug: Ms. Acocella  reports that she does not use drugs. Alcohol:  reports that she does not drink alcohol. Tobacco:  reports that she has quit smoking. She has quit using smokeless tobacco. Medical:  has a past medical history of Arthritis; Cervical spondylosis without myelopathy (09/21/2015); Chronic pain; Chronic pain associated with significant psychosocial dysfunction (08/28/2013); Depression; Diabetes mellitus without complication ([place]); Displacement of cervical intervertebral disc without myelopathy (09/05/2015); Fibromyalgia; GERD (gastroesophageal reflux disease); Hypertension; Kidney disease, chronic, stage III (GFR 30-59 ml/min); Sarcoid; and Thrombopenia ([place]). Surgical: Ms. Berryhill  has a past surgical history that includes neck fusion; Breast surgery; Back surgery; Knee arthroscopy; Fracture surgery; Hand surgery; and Abdominal hysterectomy. Family: family history includes Alcohol abuse in her brother; Alzheimer's disease in her mother; COPD in her father; Diabetes in her father; Heart disease in her father.  Constitutional Exam  General  appearance: Well nourished, well developed, and well hydrated. In no apparent acute distress Vitals:   07/27/17 0915 07/27/17 0917  BP: (!) 180/71 (!) 147/59  Pulse: 68   Resp: 16   Temp: 98.1 F (36.7 C)   SpO2: 96%   Weight: 225 lb (102.1 kg)   Height: _0  (1.6 m)    BMI Assessment: Estimated body mass index is 39.86 kg/m as calculated from the following:   Height as of this encounter: _1  (1.6 m).   Weight as of this encounter: 225 lb (102.1 kg).  BMI interpretation table: BMI level  Category Range association with higher incidence of chronic pain  <18 kg/m2 Underweight   18.5-24.9 kg/m2 Ideal body weight   25-29.9 kg/m2 Overweight Increased incidence by 20%  30-34.9 kg/m2 Obese (Class I) Increased incidence by 68%  35-39.9 kg/m2 Severe obesity (Class II) Increased incidence by 136%  >40 kg/m2 Extreme obesity (Class III) Increased incidence by 254%   BMI Readings from Last 4 Encounters:  07/27/17 39.86 kg/m  06/22/17 40.57 kg/m  06/01/17 39.86 kg/m  03/05/17 40.74 kg/m   Wt Readings from Last 4 Encounters:  07/27/17 225 lb (102.1 kg)  06/22/17 229 lb (103.9 kg)  06/01/17 225 lb (102.1 kg)  03/05/17 230 lb (104.3 kg)  Psych/Mental status: Alert, oriented x 3 (person, place, & time)       Eyes: PERLA Respiratory: No evidence of acute respiratory distress  Cervical Spine Area Exam  Skin & Axial Inspection: No masses, redness, edema, swelling, or associated skin lesions Alignment: Symmetrical Functional ROM: Limited ROM      Stability: No instability detected Muscle Tone/Strength: Functionally intact. No obvious neuro-muscular anomalies detected. Sensory (Neurological): Improved Palpation: Complains of area being tender to palpation              Upper Extremity (UE) Exam    Side: Right upper extremity  Side: Left upper extremity  Skin & Extremity Inspection: Skin color, temperature, and hair growth are WNL. No peripheral edema or cyanosis. No masses, redness,  swelling, asymmetry, or associated skin lesions. No contractures.  Skin & Extremity Inspection: Skin color, temperature, and hair growth are WNL. No peripheral edema or cyanosis. No masses, redness, swelling, asymmetry, or associated skin lesions. No contractures.  Functional ROM: Unrestricted ROM          Functional ROM: Unrestricted ROM          Muscle Tone/Strength: Functionally intact. No obvious neuro-muscular anomalies detected.  Muscle Tone/Strength: Functionally intact. No obvious neuro-muscular anomalies detected.  Sensory (Neurological): Unimpaired          Sensory (Neurological): Unimpaired          Palpation: No palpable anomalies              Palpation: No palpable anomalies              Specialized Test(s): Deferred         Specialized Test(s): Deferred          Thoracic Spine Area Exam  Skin & Axial Inspection: No masses, redness, or swelling Alignment: Symmetrical Functional ROM: Unrestricted ROM Stability: No instability detected Muscle Tone/Strength: Functionally intact. No obvious neuro-muscular anomalies detected. Sensory (Neurological): Unimpaired Muscle strength & Tone: No palpable anomalies  Lumbar Spine Area Exam  Skin & Axial Inspection: No masses, redness, or swelling Alignment: Symmetrical Functional ROM: Unrestricted ROM      Stability: No instability detected Muscle Tone/Strength: Functionally intact. No obvious neuro-muscular anomalies detected. Sensory (Neurological): Unimpaired Palpation: No palpable anomalies       Provocative Tests: Lumbar Hyperextension and rotation test: evaluation deferred today       Lumbar Lateral bending test: evaluation deferred today       Patrick's Maneuver: evaluation deferred today                    Gait & Posture Assessment  Ambulation: Unassisted Gait: Relatively normal for age and body habitus Posture: WNL   Lower Extremity Exam    Side: Right lower extremity  Side: Left lower extremity  Skin & Extremity Inspection:  Skin color, temperature, and hair growth are WNL. No peripheral edema or cyanosis. No masses, redness, swelling, asymmetry, or associated skin lesions. No contractures.  Skin & Extremity Inspection: Skin color, temperature, and hair growth are WNL. No peripheral edema or cyanosis. No masses, redness, swelling, asymmetry, or associated skin lesions. No contractures.  Functional ROM: Unrestricted ROM          Functional ROM: Unrestricted ROM          Muscle Tone/Strength: Functionally intact. No obvious neuro-muscular anomalies detected.  Muscle Tone/Strength: Functionally intact. No obvious neuro-muscular anomalies detected.  Sensory (Neurological): Unimpaired  Sensory (Neurological): Unimpaired  Palpation: No palpable anomalies  Palpation: No palpable anomalies   Assessment  Primary Diagnosis & Pertinent Problem List: The primary encounter diagnosis was Chronic low back pain (Primary Source of Pain) (Bilateral) (R>L). Diagnoses of Chronic neck pain (Secondary source of pain) (Bilateral) (L>R), Chronic pain of both shoulders, Thrombocytopenia ([place]), Stage 3 chronic kidney disease, Low magnesium levels, Vitamin D deficiency, and Disturbance of skin sensation were also pertinent to this visit.  Status Diagnosis  Controlled Improved Improved 1. Chronic low back pain (Primary Source of Pain) (Bilateral) (R>L)   2. Chronic neck pain (Secondary source of pain) (Bilateral) (L>R)   3. Chronic pain of both shoulders   4. Thrombocytopenia (HCC)   5. Stage 3 chronic kidney disease   6. Low magnesium levels   7. Vitamin D deficiency   8. Disturbance of skin sensation     Problems updated and reviewed during this visit: Problem  Chronic shoulder pain (Tertiary source of pain) (Bilateral) (L>R)  Right Arm Weakness  Chronic low back pain (Primary Source of Pain) (Bilateral) (R>L)  Chronic neck pain (Secondary source of pain) (Bilateral) (L>R)  Sarcoidosis of Lung (Hcc)  Hypertension  Morbid Obesity  With Bmi of 40.0-44.9, Adult (H[facility])  Recurrent Major Depressive Disorder, in Partial Remission ([facility])  Epigastric Pain  Essential Hypertension   Plan of Care  Pharmacotherapy (Medications Ordered): No orders of the defined types were placed in this encounter.  New Prescriptions   No medications on file   Medications administered today: Ms. Loose had no medications administered during this visit.  Procedure Orders    No procedure(s) ordered today    Lab Orders     Comprehensive metabolic panel     C-reactive protein     Sedimentation rate     Magnesium     25-Hydroxyvitamin D Lcms D2+D3     Vitamin B12     Platelet count     Platelet function assay Imaging Orders  No imaging studies ordered today   Referral Orders  No referral(s) requested today    Interventional management options: Planned, scheduled, and/or pending:   Not at this time.   Considering:   Palliativeleft sided cervical facet block Diagnostic/palliative bilateral lumbar facet block Possible bilateral lumbar facet radiofrequency ablation Diagnostic/palliative left cervical facet block  Possible left-sided cervical facet radiofrequency ablation   Palliative PRN treatment(s):   Diagnostic/palliative bilateral lumbar facet block Diagnostic/palliative left cervical facet block  Palliativeleft sided cervical facet block    Provider-requested follow-up: Return for Med-Mgmt by Dionisio David, NP.  Future Appointments Date Time Provider [place]  08/31/2017 8:45 AM Vevelyn Francois, NP [facility] None   Primary Care Physician: Sharyne Peach, MD Location: [facility] Note by: Gaspar Cola, MD Date: 07/27/2017; Time: 10:48 AM

## 2017-07-27 NOTE — Progress Notes (Signed)
Safety precautions to be maintained throughout the outpatient stay will include: orient to surroundings, keep bed in low position, maintain call bell within reach at all times, provide assistance with transfer out of bed and ambulation.  

## 2017-07-29 ENCOUNTER — Other Ambulatory Visit: Payer: Self-pay | Admitting: Pain Medicine

## 2017-07-29 DIAGNOSIS — G8929 Other chronic pain: Principal | ICD-10-CM

## 2017-07-29 DIAGNOSIS — M542 Cervicalgia: Secondary | ICD-10-CM

## 2017-07-29 DIAGNOSIS — M549 Dorsalgia, unspecified: Principal | ICD-10-CM

## 2017-08-27 NOTE — Progress Notes (Signed)
Results were reviewed and found to be: abnormal  No acute injury or pathology identified  Review would suggest interventional pain management techniques may be of benefit

## 2017-08-31 ENCOUNTER — Ambulatory Visit: Payer: Medicare Other | Attending: Nurse Practitioner | Admitting: Nurse Practitioner

## 2017-08-31 ENCOUNTER — Encounter: Payer: Self-pay | Admitting: Nurse Practitioner

## 2017-08-31 VITALS — BP 144/60 | HR 66 | Temp 97.6°F | Resp 16 | Ht 63.0 in | Wt 230.0 lb

## 2017-08-31 DIAGNOSIS — K219 Gastro-esophageal reflux disease without esophagitis: Secondary | ICD-10-CM | POA: Insufficient documentation

## 2017-08-31 DIAGNOSIS — J449 Chronic obstructive pulmonary disease, unspecified: Secondary | ICD-10-CM | POA: Insufficient documentation

## 2017-08-31 DIAGNOSIS — G8929 Other chronic pain: Secondary | ICD-10-CM

## 2017-08-31 DIAGNOSIS — Z6841 Body Mass Index (BMI) 40.0 and over, adult: Secondary | ICD-10-CM | POA: Insufficient documentation

## 2017-08-31 DIAGNOSIS — E1142 Type 2 diabetes mellitus with diabetic polyneuropathy: Secondary | ICD-10-CM | POA: Insufficient documentation

## 2017-08-31 DIAGNOSIS — F209 Schizophrenia, unspecified: Secondary | ICD-10-CM | POA: Diagnosis not present

## 2017-08-31 DIAGNOSIS — Z79899 Other long term (current) drug therapy: Secondary | ICD-10-CM | POA: Diagnosis not present

## 2017-08-31 DIAGNOSIS — M549 Dorsalgia, unspecified: Secondary | ICD-10-CM

## 2017-08-31 DIAGNOSIS — Z881 Allergy status to other antibiotic agents status: Secondary | ICD-10-CM | POA: Diagnosis not present

## 2017-08-31 DIAGNOSIS — Z79891 Long term (current) use of opiate analgesic: Secondary | ICD-10-CM | POA: Insufficient documentation

## 2017-08-31 DIAGNOSIS — Z981 Arthrodesis status: Secondary | ICD-10-CM | POA: Insufficient documentation

## 2017-08-31 DIAGNOSIS — D860 Sarcoidosis of lung: Secondary | ICD-10-CM | POA: Insufficient documentation

## 2017-08-31 DIAGNOSIS — K824 Cholesterolosis of gallbladder: Secondary | ICD-10-CM | POA: Diagnosis not present

## 2017-08-31 DIAGNOSIS — F429 Obsessive-compulsive disorder, unspecified: Secondary | ICD-10-CM | POA: Diagnosis not present

## 2017-08-31 DIAGNOSIS — M542 Cervicalgia: Secondary | ICD-10-CM

## 2017-08-31 DIAGNOSIS — I110 Hypertensive heart disease with heart failure: Secondary | ICD-10-CM | POA: Diagnosis not present

## 2017-08-31 DIAGNOSIS — M545 Low back pain: Secondary | ICD-10-CM

## 2017-08-31 DIAGNOSIS — Z87891 Personal history of nicotine dependence: Secondary | ICD-10-CM | POA: Diagnosis not present

## 2017-08-31 DIAGNOSIS — I5032 Chronic diastolic (congestive) heart failure: Secondary | ICD-10-CM | POA: Insufficient documentation

## 2017-08-31 DIAGNOSIS — N3946 Mixed incontinence: Secondary | ICD-10-CM | POA: Diagnosis not present

## 2017-08-31 DIAGNOSIS — M47812 Spondylosis without myelopathy or radiculopathy, cervical region: Secondary | ICD-10-CM

## 2017-08-31 DIAGNOSIS — M4322 Fusion of spine, cervical region: Secondary | ICD-10-CM

## 2017-08-31 DIAGNOSIS — E785 Hyperlipidemia, unspecified: Secondary | ICD-10-CM | POA: Insufficient documentation

## 2017-08-31 DIAGNOSIS — G894 Chronic pain syndrome: Secondary | ICD-10-CM | POA: Diagnosis not present

## 2017-08-31 DIAGNOSIS — M4722 Other spondylosis with radiculopathy, cervical region: Secondary | ICD-10-CM | POA: Insufficient documentation

## 2017-08-31 DIAGNOSIS — M797 Fibromyalgia: Secondary | ICD-10-CM

## 2017-08-31 DIAGNOSIS — N3281 Overactive bladder: Secondary | ICD-10-CM | POA: Diagnosis not present

## 2017-08-31 DIAGNOSIS — Z794 Long term (current) use of insulin: Secondary | ICD-10-CM | POA: Insufficient documentation

## 2017-08-31 DIAGNOSIS — M7918 Myalgia, other site: Secondary | ICD-10-CM

## 2017-08-31 MED ORDER — PREGABALIN 150 MG PO CAPS: 150.0000 mg | ORAL_CAPSULE | Freq: Two times a day (BID) | ORAL | 2 refills | Status: DC

## 2017-08-31 MED ORDER — OXYCODONE HCL 5 MG PO TABS: 5.0000 mg | ORAL_TABLET | Freq: Three times a day (TID) | ORAL | 0 refills | Status: DC | PRN

## 2017-08-31 MED ORDER — TIZANIDINE HCL 4 MG PO TABS: 4.0000 mg | ORAL_TABLET | Freq: Two times a day (BID) | ORAL | 2 refills | Status: DC | PRN

## 2017-08-31 NOTE — Progress Notes (Signed)
Patient's Name: Amber Acosta  MRN: [id number]  Referring Provider: Sharyne Peach, MD  DOB: 1951/05/23  PCP: Sharyne Peach, MD  DOS: 08/31/2017  Note by: Vevelyn Francois NP  Service setting: Ambulatory outpatient  Specialty: Interventional Pain Management  Location: [facility] (AMB) Pain Management Facility    Patient type: Established    Primary Reason(s) for Visit: Encounter for prescription drug management. (Level of risk: moderate)  CC: Neck Pain (mid)  HPI  Amber Acosta is a 66 y.o. year old, female patient, who comes today for a medication management evaluation. She has Cervical vertebral fusion (Uncomplicated C5 through C7 ACDF.); Fibromyalgia; Lumbar facet joint pain; Lumbar facet syndrome (Bilateral) (R>L); Chronic kidney disease; COPD (chronic obstructive pulmonary disease) ([place]); Depression; Type 2 diabetes mellitus ([place]); Hypertensive heart disease with congestive heart failure ([place]); Gall bladder polyp; GERD (gastroesophageal reflux disease); Hyperlipemia; Inflammatory polyarthropathy ([place]); Sleep terror; Obesity, Class II, BMI 35-39.9, with comorbidity; OP (osteoporosis); Psoriasis; Kidney cysts; Muscle weakness (generalized); Sarcoidosis; Thrombocytopenia ([place]); Vitamin D deficiency; Chronic neck pain (Secondary source of pain) (Bilateral) (L>R); Cervicogenic headache (Left); Cervical facet syndrome (Left); At risk for falls; Balance problems; History of closed head injury; Opiate use (22.5 MME/Day); Long term current use of opiate analgesic; Encounter for therapeutic drug level monitoring; Long term prescription opiate use; Spondylolisthesis of cervical region; Cervical spine ankylosis; Hx of cervical spine surgery; Low magnesium levels; Myofascial pain; Neuropathy; Benign paroxysmal positional vertigo; Diabetic peripheral neuropathy ([place]); Meralgia paresthetica (Right); Cervical spondylosis; Chronic diastolic congestive heart failure ([place]); Mixed stress and urge urinary incontinence; Overactive  detrusor; 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[place]); Chronic low back pain (Primary Source of Pain) (Bilateral) (R>L); Chronic pain syndrome; Sarcoidosis of lung ([place]); Chronic upper back pain; Chronic Occipital neuralgia (Left); Epigastric pain; Displacement of cervical intervertebral disc without myelopathy; Essential hypertension; Hypertension; Morbid obesity with BMI of 40.0-44.9, adult ([place]); Recurrent major depressive disorder, in partial remission ([place]); Right arm weakness; Acute postoperative pain; Disturbance of skin sensation; Cervical spondylitis with radiculitis (HCC); and Chronic shoulder pain (Tertiary source of pain) (Bilateral) (L>R) on her problem list. Her primarily concern today is the Neck Pain (mid)  Pain Assessment: Location: Left Neck Radiating: up side of left head Onset: More than a month ago Duration: Chronic pain Quality: Sharp (Intense) Severity: 0-No pain/10 (self-reported pain score)  Note: Reported level is compatible with observation.                    Effect on ADL: csnt look up, dizzy Timing: Intermittent Modifying factors: rest, procedures  Amber Acosta was last scheduled for an appointment on 06/01/2017 for medication management. During today's appointment we reviewed Amber Acosta's chronic pain status, as well as her outpatient medication regimen. She admits that she continues to have relief from the cervical RFA. She admits that she has felt a few headaches but nothing like what she has had. She has been suffering with headaches for 15 yrs. She admits that she is under increased stress and lack of sleep secondary to caring for her husband.   The patient  reports that she does not use drugs. Her body mass index is 40.74 kg/m.  Further details on both, my assessment(s), as well as the proposed treatment plan, please see below.  Controlled Substance Pharmacotherapy Assessment REMS (Risk Evaluation and Mitigation Strategy)  Analgesic:Oxycodone IR 5 mg every 8 hours (15  mg/day) MME/day:22.5 mg/day [name], RN  08/31/2017  8:41 AM  Sign at close encounter Nursing Pain Medication  Assessment:  Safety precautions to be maintained throughout the outpatient stay will include: orient to surroundings, keep bed in low position, maintain call bell within reach at all times, provide assistance with transfer out of bed and ambulation.  Medication Inspection Compliance: Pill count conducted under aseptic conditions, in front of the patient. Neither the pills nor the bottle was removed from the patient's sight at any time. Once count was completed pills were immediately returned to the patient in their original bottle.  Medication: See above Pill/Patch Count: 51 of 90 pills remain Pill/Patch Appearance: Markings consistent with prescribed medication Bottle Appearance: Standard pharmacy container. Clearly labeled. Filled Date: 08 / 18 / 2018 Last Medication intake:  Today   Pharmacokinetics: Liberation and absorption (onset of action): WNL Distribution (time to peak effect): WNL Metabolism and excretion (duration of action): WNL         Pharmacodynamics: Desired effects: Analgesia: Amber Acosta reports >50% benefit. Functional ability: Patient reports that medication allows her to accomplish basic ADLs Clinically meaningful improvement in function (CMIF): Sustained CMIF goals met Perceived effectiveness: Described as relatively effective, allowing for increase in activities of daily living (ADL) Undesirable effects: Side-effects or Adverse reactions: None reported Monitoring: [place] PMP: Online review of the past 5-monthperiod conducted. Compliant with practice rules and regulations Last UDS on record: Summary  Date Value Ref Range Status  06/01/2017 FINAL  Final    Comment:    ==================================================================== TOXASSURE SELECT 13 (MW) ==================================================================== Test                              Result       Flag       Units Drug Present and Declared for Prescription Verification   Oxycodone                      3597         EXPECTED   ng/mg creat   Noroxycodone                   1965         EXPECTED   ng/mg creat    Sources of oxycodone include scheduled prescription medications.    Noroxycodone is an expected metabolite of oxycodone. ==================================================================== Test                      Result    Flag   Units      Ref Range   Creatinine              122              mg/dL      >=20 ==================================================================== Declared Medications:  The flagging and interpretation on this report are based on the  following declared medications.  Unexpected results may arise from  inaccuracies in the declared medications.  **Note: The testing scope of this panel includes these medications:  Oxycodone (Roxicodone)  **Note: The testing scope of this panel does not include following  reported medications:  Albuterol (Proventil)  Amlodipine  Budenoside (Symbicort)  Famotidine (Pepcid)  Fluoxetine (Prozac)  Formoterol (Symbicort)  Furosemide (Lasix)  Hydralazine (Apresoline)  Losartan (Cozaar)  Lovastatin (Mevacor)  Metoprolol (Toprol)  Nitroglycerin (Nitrostat)  Nystatin (Mycostatin)  Phentermine  Potassium (Klor-Con)  Pregabalin (Lyrica)  Tizanidine (Zanaflex)  Trazodone (Desyrel)  Vitamin D2 (Drisdol) ==================================================================== For clinical consultation, please call ([phone] ====================================================================    UDS  interpretation: Compliant          Medication Assessment Form: Reviewed. Patient indicates being compliant with therapy Treatment compliance: Compliant Risk Assessment Profile: Aberrant behavior: See prior evaluations. None observed or detected today Comorbid factors increasing risk of  overdose: See prior notes. No additional risks detected today Risk of substance use disorder (SUD): Low     Opioid Risk Tool - 08/31/17 0840      Family History of Substance Abuse   Alcohol Negative   Illegal Drugs Negative   Rx Drugs Negative     Personal History of Substance Abuse   Alcohol Negative   Illegal Drugs Negative   Rx Drugs Negative     Age   Age between 58-45 years  No     Psychological Disease   Psychological Disease Positive   ADD Negative   OCD Negative   Bipolar Negative   Schizophrenia Negative   Depression Positive     Total Score   Opioid Risk Tool Scoring 3   Opioid Risk Interpretation Low Risk     ORT Scoring interpretation table:  Score <3 = Low Risk for SUD  Score between 4-7 = Moderate Risk for SUD  Score >8 = High Risk for Opioid Abuse   Risk Mitigation Strategies:  Patient Counseling: Covered Patient-Prescriber Agreement (PPA): Present and active  Notification to other healthcare providers: Done  Pharmacologic Plan: No change in therapy, at this time  Laboratory Chemistry  Inflammation Markers (CRP: Acute Phase) (ESR: Chronic Phase) Lab Results  Component Value Date   CRP 2.1 (H) 03/26/2016   ESRSEDRATE 52 (H) 03/26/2016                 Renal Function Markers Lab Results  Component Value Date   BUN 14 03/26/2016   CREATININE 1.28 (H) 03/26/2016   GFRAA 50 (L) 03/26/2016   GFRNONAA 44 (L) 03/26/2016                 Hepatic Function Markers Lab Results  Component Value Date   AST 24 03/26/2016   ALT 20 03/26/2016   ALBUMIN 4.0 03/26/2016   ALKPHOS 70 03/26/2016                 Electrolytes Lab Results  Component Value Date   NA 136 03/26/2016   K 3.8 03/26/2016   CL 100 (L) 03/26/2016   CALCIUM 8.5 (L) 03/26/2016   MG 2.1 06/22/2017                 Neuropathy Markers Lab Results  Component Value Date   VITAMINB12 628 06/22/2017                 Bone Pathology Markers Lab Results  Component Value Date    ALKPHOS 70 03/26/2016   VD25OH 20.2 (L) 03/26/2016   CALCIUM 8.5 (L) 03/26/2016                 Coagulation Parameters Lab Results  Component Value Date   PLT 146 (L) 03/26/2016                 Cardiovascular Markers No results found for: BNP, HGB, HCT               Note: Lab results reviewed.  Recent Diagnostic Imaging Results  MR CERVICAL SPINE WO CONTRAST CLINICAL DATA:  Chronic cervical spine pain. Cervical radiculitis. Previous fusion at C5-6 and C6-7.  EXAM: MRI CERVICAL SPINE WITHOUT CONTRAST  TECHNIQUE: Multiplanar,  multisequence MR imaging of the cervical spine was performed. No intravenous contrast was administered.  COMPARISON:  CT scan dated 08/14/2015  FINDINGS: Alignment: Physiologic.  Vertebrae: Solid anterior fusion at C5-6 and C6-7.  Cord: Normal signal and morphology.  Posterior Fossa, vertebral arteries, paraspinal tissues: Negative.  Disc levels:  Craniocervical junction through C2-3: There is fusion of the C2 and C3 vertebral bodies as well as of the left facet joint at C2-3. There is no disc bulging or protrusion.  C3-4:  Moderate left facet arthritis.  The disc is normal.  C4-5: Normal disc. Mild to moderate left facet arthritis. Widely patent neural foramina.  C5-6 and C6-7: Solid anterior interbody fusion at each level with no residual impingement.  C7-T1: Tiny central disc bulge with no neural impingement. Widely patent neural foramina.  T1-2:  Normal.  T2-3: Benign hemangioma in the T2 vertebral body. Disc space narrowing with a small broad-based disc bulge without neural impingement. Widely patent neural foramina.  IMPRESSION: 1. Facet arthritis at C3-4 and C4-5 on the left. 2. Solid anterior interbody fusions at C5-6 and C6-7. 3. Auto fusion at C2-3. 4. Tiny central disc bulge at C7-T1 with no neural impingement.  Electronically Signed   By: Lorriane Shire M.D.   On: 07/06/2017 11:10  Note: Imaging results reviewed.         Meds   Current Outpatient Prescriptions:  .  albuterol (PROVENTIL HFA;VENTOLIN HFA) 108 (90 Base) MCG/ACT inhaler, Inhale 2 puffs into the lungs every 6 (six) hours as needed. , Disp: , Rfl:  .  amLODipine (NORVASC) 10 MG tablet, Take 10 mg by mouth daily. , Disp: , Rfl:  .  B-D INS SYR ULTRAFINE 1CC/30G 30G X 1/2" 1 ML MISC, , Disp: , Rfl:  .  B-D INS SYR ULTRAFINE 1CC/31G 31G X 5/16" 1 ML MISC, , Disp: , Rfl:  .  famotidine (PEPCID) 20 MG tablet, , Disp: , Rfl:  .  FLUoxetine (PROZAC) 40 MG capsule, Take 40 mg by mouth daily. , Disp: , Rfl:  .  furosemide (LASIX) 40 MG tablet, Take 40 mg by mouth daily., Disp: , Rfl:  .  hydrALAZINE (APRESOLINE) 25 MG tablet, Take 25 mg by mouth 2 (two) times daily. , Disp: , Rfl:  .  insulin NPH Human (HUMULIN N,NOVOLIN N) 100 UNIT/ML injection, NPH inject 70 in the am prior to breakfast, and 50 units SQ  Units prior dinner., Disp: , Rfl:  .  Insulin Syringe-Needle U-100 (INSULIN SYRINGE 1CC/31GX5/16") 31G X 5/16" 1 ML MISC, Use once daily as directed by physician., Disp: , Rfl:  .  KLOR-CON 10 10 MEQ tablet, Take 10 mEq by mouth daily. , Disp: , Rfl:  .  losartan (COZAAR) 100 MG tablet, TAKE 1 TABLET (100 MG TOTAL) BY MOUTH ONCE DAILY., Disp: , Rfl:  .  lovastatin (MEVACOR) 10 MG tablet, Take 10 mg by mouth at bedtime., Disp: , Rfl:  .  metoprolol succinate (TOPROL-XL) 50 MG 24 hr tablet, Take 25 mg by mouth daily. , Disp: , Rfl:  .  nitroGLYCERIN (NITROSTAT) 0.3 MG SL tablet, 1 tablet sublingual prn for ESOPHAGEAL SPASMS(may repeat every 5 min, seek med help if pain persists after 3 tablets), Disp: , Rfl:  .  [START ON 10/07/2017] oxyCODONE (OXY IR/ROXICODONE) 5 MG immediate release tablet, Take 1 tablet (5 mg total) by mouth every 8 (eight) hours as needed for moderate pain or severe pain., Disp: 90 tablet, Rfl: 0 .  [START ON 11/06/2017] oxyCODONE (  OXY IR/ROXICODONE) 5 MG immediate release tablet, Take 1 tablet (5 mg total) by mouth every 8 (eight)  hours as needed for moderate pain or severe pain., Disp: 90 tablet, Rfl: 0 .  Spacer/Aero-Holding Chambers (OPTICHAMBER DIAMOND) MISC, See admin instructions., Disp: , Rfl: 2 .  traZODone (DESYREL) 100 MG tablet, Take 200 mg by mouth at bedtime. , Disp: , Rfl:  .  insulin NPH Human (HUMULIN N,NOVOLIN N) 100 UNIT/ML injection, NPH inject SQ bid 60 units, Disp: , Rfl:  .  [START ON 12/06/2017] oxyCODONE (OXY IR/ROXICODONE) 5 MG immediate release tablet, Take 1 tablet (5 mg total) by mouth every 8 (eight) hours as needed for moderate pain or severe pain., Disp: 90 tablet, Rfl: 0 .  [START ON 10/07/2017] pregabalin (LYRICA) 150 MG capsule, Take 1 capsule (150 mg total) by mouth 2 (two) times daily., Disp: 60 capsule, Rfl: 2 .  [START ON 12/06/2017] tiZANidine (ZANAFLEX) 4 MG tablet, Take 1 tablet (4 mg total) by mouth 2 (two) times daily as needed for muscle spasms., Disp: 60 tablet, Rfl: 2  ROS  Constitutional: Denies any fever or chills Gastrointestinal: No reported hemesis, hematochezia, vomiting, or acute GI distress Musculoskeletal: Denies any acute onset joint swelling, redness, loss of ROM, or weakness Neurological: No reported episodes of acute onset apraxia, aphasia, dysarthria, agnosia, amnesia, paralysis, loss of coordination, or loss of consciousness  Allergies  Ms. Haacke is allergic to enbrel [etanercept]; metformin; methotrexate; mushroom extract complex; other; darvon [propoxyphene]; minocycline; and ciprofloxacin.  PFSH  Drug: Ms. Persico  reports that she does not use drugs. Alcohol:  reports that she does not drink alcohol. Tobacco:  reports that she has quit smoking. She has quit using smokeless tobacco. Medical:  has a past medical history of Arthritis; Cervical spondylosis without myelopathy (09/21/2015); Chronic pain; Chronic pain associated with significant psychosocial dysfunction (08/28/2013); Depression; Diabetes mellitus without complication ([place]); Displacement of cervical  intervertebral disc without myelopathy (09/05/2015); Fibromyalgia; GERD (gastroesophageal reflux disease); Hypertension; Kidney disease, chronic, stage III (GFR 30-59 ml/min) ([place]); Sarcoid; and Thrombopenia ([place]). Surgical: Ms. Gossard  has a past surgical history that includes neck fusion; Breast surgery; Back surgery; Knee arthroscopy; Fracture surgery; Hand surgery; and Abdominal hysterectomy. Family: family history includes Alcohol abuse in her brother; Alzheimer's disease in her mother; COPD in her father; Diabetes in her father; Heart disease in her father.  Constitutional Exam  General appearance: Well nourished, well developed, and well hydrated. In no apparent acute distress Vitals:   08/31/17 0830  BP: (!) 144/60  Pulse: 66  Resp: 16  Temp: 97.6 F (36.4 C)  SpO2: 98%  Weight: 230 lb (104.3 kg)  Height: '5\' 3"'$  (1.6 m)   BMI Assessment: Estimated body mass index is 40.74 kg/m as calculated from the following:   Height as of this encounter: '5\' 3"'$  (1.6 m).   Weight as of this encounter: 230 lb (104.3 kg).  Psych/Mental status: Alert, oriented x 3 (person, place, & time)       Eyes: PERLA Respiratory: No evidence of acute respiratory distress  Cervical Spine Area Exam  Skin & Axial Inspection: No masses, redness, edema, swelling, or associated skin lesions Alignment: Symmetrical Functional ROM: Unrestricted ROM      Stability: No instability detected Muscle Tone/Strength: Functionally intact. No obvious neuro-muscular anomalies detected. Sensory (Neurological): Unimpaired Palpation: No palpable anomalies              Upper Extremity (UE) Exam    Side: Right upper extremity  Side: Left  upper extremity  Skin & Extremity Inspection: Skin color, temperature, and hair growth are WNL. No peripheral edema or cyanosis. No masses, redness, swelling, asymmetry, or associated skin lesions. No contractures.  Skin & Extremity Inspection: Skin color, temperature, and hair growth are WNL. No  peripheral edema or cyanosis. No masses, redness, swelling, asymmetry, or associated skin lesions. No contractures.  Functional ROM: Unrestricted ROM          Functional ROM: Unrestricted ROM          Muscle Tone/Strength: Functionally intact. No obvious neuro-muscular anomalies detected.  Muscle Tone/Strength: Functionally intact. No obvious neuro-muscular anomalies detected.  Sensory (Neurological): Unimpaired          Sensory (Neurological): Unimpaired          Palpation: No palpable anomalies              Palpation: No palpable anomalies              Specialized Test(s): Deferred         Specialized Test(s): Deferred          Thoracic Spine Area Exam  Skin & Axial Inspection: No masses, redness, or swelling Alignment: Symmetrical Functional ROM: Unrestricted ROM Stability: No instability detected Muscle Tone/Strength: Functionally intact. No obvious neuro-muscular anomalies detected. Sensory (Neurological): Unimpaired Muscle strength & Tone: No palpable anomalies  Lumbar Spine Area Exam  Skin & Axial Inspection: No masses, redness, or swelling Alignment: Symmetrical Functional ROM: Unrestricted ROM      Stability: No instability detected Muscle Tone/Strength: Functionally intact. No obvious neuro-muscular anomalies detected. Sensory (Neurological): Unimpaired Palpation: No palpable anomalies       Provocative Tests: Lumbar Hyperextension and rotation test: evaluation deferred today       Lumbar Lateral bending test: evaluation deferred today       Patrick's Maneuver: evaluation deferred today                    Gait & Posture Assessment  Ambulation: Unassisted Gait: Relatively normal for age and body habitus Posture: WNL   Lower Extremity Exam    Side: Right lower extremity  Side: Left lower extremity  Skin & Extremity Inspection: Skin color, temperature, and hair growth are WNL. No peripheral edema or cyanosis. No masses, redness, swelling, asymmetry, or associated skin  lesions. No contractures.  Skin & Extremity Inspection: Skin color, temperature, and hair growth are WNL. No peripheral edema or cyanosis. No masses, redness, swelling, asymmetry, or associated skin lesions. No contractures.  Functional ROM: Unrestricted ROM          Functional ROM: Unrestricted ROM          Muscle Tone/Strength: Functionally intact. No obvious neuro-muscular anomalies detected.  Muscle Tone/Strength: Functionally intact. No obvious neuro-muscular anomalies detected.  Sensory (Neurological): Unimpaired  Sensory (Neurological): Unimpaired  Palpation: No palpable anomalies  Palpation: No palpable anomalies   Assessment  Primary Diagnosis & Pertinent Problem List: The primary encounter diagnosis was Chronic neck pain (Secondary source of pain) (Bilateral) (L>R). Diagnoses of Chronic low back pain (Primary Source of Pain) (Bilateral) (R>L), Cervical spondylosis, Cervical vertebral fusion (Uncomplicated C5 through C7 ACDF.), Fibromyalgia, Chronic pain syndrome, and Myofascial pain were also pertinent to this visit.  Status Diagnosis  Controlled Controlled Controlled 1. Chronic neck pain (Secondary source of pain) (Bilateral) (L>R)   2. Chronic low back pain (Primary Source of Pain) (Bilateral) (R>L)   3. Cervical spondylosis   4. Cervical vertebral fusion (Uncomplicated C5 through C7 ACDF.)  5. Fibromyalgia   6. Chronic pain syndrome   7. Myofascial pain     Problems updated and reviewed during this visit: No problems updated. Plan of Care  Pharmacotherapy (Medications Ordered): Meds ordered this encounter  Medications  . oxyCODONE (OXY IR/ROXICODONE) 5 MG immediate release tablet    Sig: Take 1 tablet (5 mg total) by mouth every 8 (eight) hours as needed for moderate pain or severe pain.    Dispense:  90 tablet    Refill:  0    Do not place this medication, or any other prescription from our practice, on "Automatic Refill". Patient may have prescription filled one day  early if pharmacy is closed on scheduled refill date. Do not fill until:10/07/2017 To last until: 11/06/2017    Order Specific Question:   Supervising Provider    Answer:   Milinda Pointer [phone]  . oxyCODONE (OXY IR/ROXICODONE) 5 MG immediate release tablet    Sig: Take 1 tablet (5 mg total) by mouth every 8 (eight) hours as needed for moderate pain or severe pain.    Dispense:  90 tablet    Refill:  0    Do not place this medication, or any other prescription from our practice, on "Automatic Refill". Patient may have prescription filled one day early if pharmacy is closed on scheduled refill date. Do not fill until:12/06/2017 To last until:01/05/2018    Order Specific Question:   Supervising Provider    Answer:   Milinda Pointer [phone]  . oxyCODONE (OXY IR/ROXICODONE) 5 MG immediate release tablet    Sig: Take 1 tablet (5 mg total) by mouth every 8 (eight) hours as needed for moderate pain or severe pain.    Dispense:  90 tablet    Refill:  0    Do not place this medication, or any other prescription from our practice, on "Automatic Refill". Patient may have prescription filled one day early if pharmacy is closed on scheduled refill date. Do not fill until: 11/06/2017 To last until: 12/06/2017    Order Specific Question:   Supervising Provider    Answer:   Milinda Pointer [phone]  . pregabalin (LYRICA) 150 MG capsule    Sig: Take 1 capsule (150 mg total) by mouth 2 (two) times daily.    Dispense:  60 capsule    Refill:  2    Do not place this medication, or any other prescription from our practice, on "Automatic Refill". Patient may have prescription filled one day early if pharmacy is closed on scheduled refill date.    Order Specific Question:   Supervising Provider    Answer:   Milinda Pointer [phone]  . tiZANidine (ZANAFLEX) 4 MG tablet    Sig: Take 1 tablet (4 mg total) by mouth 2 (two) times daily as needed for muscle spasms.    Dispense:  60 tablet    Refill:  2     Do not place this medication, or any other prescription from our practice, on "Automatic Refill". Patient may have prescription filled one day early if pharmacy is closed on scheduled refill date.    Order Specific Question:   Supervising Provider    Answer:   Milinda Pointer [[id number]]   New Prescriptions   No medications on file   Medications administered today: Ms. Metzer had no medications administered during this visit. Lab-work, procedure(s), and/or referral(s): No orders of the defined types were placed in this encounter.  Imaging and/or referral(s): None  Interventional management options: Planned, scheduled, and/or  pending:   Not at this time.   Considering:   Palliativeleft sided cervical facet block Diagnostic/palliative bilateral lumbar facet block Possible bilateral lumbar facet radiofrequency ablation Diagnostic/palliative left cervical facet block  Possible left-sided cervical facet radiofrequency ablation   Palliative PRN treatment(s):   Diagnostic/palliative bilateral lumbar facet block Diagnostic/palliative left cervical facet block  Palliativeleft sided cervical facet block    Provider-requested follow-up: Return in about 3 months (around 12/01/2017) for MedMgmt.  Future Appointments Date Time Provider [place]  11/18/2017 8:45 AM Vevelyn Francois, NP [facility] None   Primary Care Physician: Sharyne Peach, MD Location: [facility] Note by: Vevelyn Francois NP Date: 08/31/2017; Time: 1:13 PM  Pain Score Disclaimer: We use the NRS-11 scale. This is a self-reported, subjective measurement of pain severity with only modest accuracy. It is used primarily to identify changes within a particular patient. It must be understood that outpatient pain scales are significantly less accurate that those used for research, where they can be applied under ideal controlled circumstances with minimal exposure to variables. In reality,  the score is likely to be a combination of pain intensity and pain affect, where pain affect describes the degree of emotional arousal or changes in action readiness caused by the sensory experience of pain. Factors such as social and work situation, setting, emotional state, anxiety levels, expectation, and prior pain experience may influence pain perception and show large inter-individual differences that may also be affected by time variables.  Patient instructions provided during this appointment: Patient Instructions    ____________________________________________________________________________________________  Medication Rules  Applies to: All patients receiving prescriptions (written or electronic).  Pharmacy of record: Pharmacy where electronic prescriptions will be sent. If written prescriptions are taken to a different pharmacy, please inform the nursing staff. The pharmacy listed in the electronic medical record should be the one where you would like electronic prescriptions to be sent.  Prescription refills: Only during scheduled appointments. Applies to both, written and electronic prescriptions.  NOTE: The following applies primarily to controlled substances (Opioid* Pain Medications).   Patient's responsibilities: 1. Pain Pills: Bring all pain pills to every appointment (except for procedure appointments). 2. Pill Bottles: Bring pills in original pharmacy bottle. Always bring newest bottle. Bring bottle, even if empty. 3. Medication refills: You are responsible for knowing and keeping track of what medications you need refilled. The day before your appointment, write a list of all prescriptions that need to be refilled. Bring that list to your appointment and give it to the admitting nurse. Prescriptions will be written only during appointments. If you forget a medication, it will not be "Called in", "Faxed", or "electronically sent". You will need to get another appointment to  get these prescribed. 4. Prescription Accuracy: You are responsible for carefully inspecting your prescriptions before leaving our office. Have the discharge nurse carefully go over each prescription with you, before taking them home. Make sure that your name is accurately spelled, that your address is correct. Check the name and dose of your medication to make sure it is accurate. Check the number of pills, and the written instructions to make sure they are clear and accurate. Make sure that you are given enough medication to last until your next medication refill appointment. 5. Taking Medication: Take medication as prescribed. Never take more pills than instructed. Never take medication more frequently than prescribed. Taking less pills or less frequently is permitted and encouraged, when it comes to controlled substances (written prescriptions).  6. Inform other Doctors: Always inform, all of your healthcare providers, of all the medications you take. 7. Pain Medication from other Providers: You are not allowed to accept any additional pain medication from any other Doctor or Healthcare provider. There are two exceptions to this rule. (see below) In the event that you require additional pain medication, you are responsible for notifying us, as stated below. 8. Medication Agreement: You are responsible for carefully reading and following our Medication Agreement. This must be signed before receiving any prescriptions from our practice. Safely store a copy of your signed Agreement. Violations to the Agreement will result in no further prescriptions. (Additional copies of our Medication Agreement are available upon request.) 9. Laws, Rules, & Regulations: All patients are expected to follow all Federal and [employer], Rules, [employer]. Ignorance of the Laws does not constitute a valid excuse. The use of any illegal substances is prohibited. 10. Adopted CDC guidelines & recommendations: Target  dosing levels will be at or below 60 MME/day. Use of benzodiazepines** is not recommended.  Exceptions: There are only two exceptions to the rule of not receiving pain medications from other Healthcare Providers. 1. Exception #1 (Emergencies): In the event of an emergency (i.e.: accident requiring emergency care), you are allowed to receive additional pain medication. However, you are responsible for: As soon as you are able, call our office [phone], at any time of the day or night, and leave a message stating your name, the date and nature of the emergency, and the name and dose of the medication prescribed. In the event that your call is answered by a member of our staff, make sure to document and save the date, time, and the name of the person that took your information.  2. Exception #2 (Planned Surgery): In the event that you are scheduled by another doctor or dentist to have any type of surgery or procedure, you are allowed (for a period no longer than 30 days), to receive additional pain medication, for the acute post-op pain. However, in this case, you are responsible for picking up a copy of our "Post-op Pain Management for Surgeons" handout, and giving it to your surgeon or dentist. This document is available at our office, and does not require an appointment to obtain it. Simply go to our office during business hours (Monday-Thursday from 8:00 AM to 4:00 PM) (Friday 8:00 AM to 12:00 Noon) or if you have a scheduled appointment with [place], prior to your surgery, and ask for it by name. In addition, you will need to provide [place] with your name, name of your surgeon, type of surgery, and date of procedure or surgery.  *Opioid medications include: morphine, codeine, oxycodone, oxymorphone, hydrocodone, hydromorphone, meperidine, tramadol, tapentadol, buprenorphine, fentanyl, methadone. **Benzodiazepine medications include: diazepam (Valium), alprazolam (Xanax), clonazepam (Klonopine), lorazepam  (Ativan), clorazepate (Tranxene), chlordiazepoxide (Librium), estazolam (Prosom), oxazepam (Serax), temazepam (Restoril), triazolam (Halcion)  ____________________________________________________________________________________________  BMI Assessment: Estimated body mass index is 40.74 kg/m as calculated from the following:   Height as of this encounter: '5\' 3"'$  (1.6 m).   Weight as of this encounter: 230 lb (104.3 kg).  BMI interpretation table: BMI level Category Range association with higher incidence of chronic pain  <18 kg/m2 Underweight   18.5-24.9 kg/m2 Ideal body weight   25-29.9 kg/m2 Overweight Increased incidence by 20%  30-34.9 kg/m2 Obese (Class I) Increased incidence by 68%  35-39.9 kg/m2 Severe obesity (Class II) Increased incidence by 136%  >40 kg/m2 Extreme obesity (Class III)  Increased incidence by 254%   BMI Readings from Last 4 Encounters:  08/31/17 40.74 kg/m  07/27/17 39.86 kg/m  06/22/17 40.57 kg/m  06/01/17 39.86 kg/m   Wt Readings from Last 4 Encounters:  08/31/17 230 lb (104.3 kg)  07/27/17 225 lb (102.1 kg)  06/22/17 229 lb (103.9 kg)  06/01/17 225 lb (102.1 kg)  Pain Management Discharge Instructions  General Discharge Instructions :  If you need to reach your doctor call: Monday-Friday 8:00 am - 4:00 pm at [phone] or toll free [phone].  After clinic hours [phone] to have operator reach doctor.  Bring all of your medication bottles to all your appointments in the pain clinic.  To cancel or reschedule your appointment with Pain Management please remember to call 24 hours in advance to avoid a fee.  Refer to the educational materials which you have been given on: General Risks, I had my Procedure. Discharge Instructions, Post Sedation.  Post Procedure Instructions:  The drugs you were given will stay in your system until tomorrow, so for the next 24 hours you should not drive, make any legal decisions or drink any alcoholic  beverages.  You may eat anything you prefer, but it is better to start with liquids then soups and crackers, and gradually work up to solid foods.  Please notify your doctor immediately if you have any unusual bleeding, trouble breathing or pain that is not related to your normal pain.  Depending on the type of procedure that was done, some parts of your body may feel week and/or numb.  This usually clears up by tonight or the next day.  Walk with the use of an assistive device or accompanied by an adult for the 24 hours.  You may use ice on the affected area for the first 24 hours.  Put ice in a Ziploc bag and cover with a towel and place against area 15 minutes on 15 minutes off.  You may switch to heat after 24 hours.

## 2017-08-31 NOTE — Patient Instructions (Addendum)
____________________________________________________________________________________________  Medication Rules  Applies to: All patients receiving prescriptions (written or electronic).  Pharmacy of record: Pharmacy where electronic prescriptions will be sent. If written prescriptions are taken to a different pharmacy, please inform the nursing staff. The pharmacy listed in the electronic medical record should be the one where you would like electronic prescriptions to be sent.  Prescription refills: Only during scheduled appointments. Applies to both, written and electronic prescriptions.  NOTE: The following applies primarily to controlled substances (Opioid* Pain Medications).   Patient's responsibilities: 1. Pain Pills: Bring all pain pills to every appointment (except for procedure appointments). 2. Pill Bottles: Bring pills in original pharmacy bottle. Always bring newest bottle. Bring bottle, even if empty. 3. Medication refills: You are responsible for knowing and keeping track of what medications you need refilled. The day before your appointment, write a list of all prescriptions that need to be refilled. Bring that list to your appointment and give it to the admitting nurse. Prescriptions will be written only during appointments. If you forget a medication, it will not be "Called in", "Faxed", or "electronically sent". You will need to get another appointment to get these prescribed. 4. Prescription Accuracy: You are responsible for carefully inspecting your prescriptions before leaving our office. Have the discharge nurse carefully go over each prescription with you, before taking them home. Make sure that your name is accurately spelled, that your address is correct. Check the name and dose of your medication to make sure it is accurate. Check the number of pills, and the written instructions to make sure they are clear and accurate. Make sure that you are given enough medication to  last until your next medication refill appointment. 5. Taking Medication: Take medication as prescribed. Never take more pills than instructed. Never take medication more frequently than prescribed. Taking less pills or less frequently is permitted and encouraged, when it comes to controlled substances (written prescriptions).  6. Inform other Doctors: Always inform, all of your healthcare providers, of all the medications you take. 7. Pain Medication from other Providers: You are not allowed to accept any additional pain medication from any other Doctor or Healthcare provider. There are two exceptions to this rule. (see below) In the event that you require additional pain medication, you are responsible for notifying us, as stated below. 8. Medication Agreement: You are responsible for carefully reading and following our Medication Agreement. This must be signed before receiving any prescriptions from our practice. Safely store a copy of your signed Agreement. Violations to the Agreement will result in no further prescriptions. (Additional copies of our Medication Agreement are available upon request.) 9. Laws, Rules, & Regulations: All patients are expected to follow all Federal and State Laws, Statutes, Rules, & Regulations. Ignorance of the Laws does not constitute a valid excuse. The use of any illegal substances is prohibited. 10. Adopted CDC guidelines & recommendations: Target dosing levels will be at or below 60 MME/day. Use of benzodiazepines** is not recommended.  Exceptions: There are only two exceptions to the rule of not receiving pain medications from other Healthcare Providers. 1. Exception #1 (Emergencies): In the event of an emergency (i.e.: accident requiring emergency care), you are allowed to receive additional pain medication. However, you are responsible for: As soon as you are able, call our office [phone], at any time of the day or night, and leave a message stating your  name, the date and nature of the emergency, and the name and dose of the medication   prescribed. In the event that your call is answered by a member of our staff, make sure to document and save the date, time, and the name of the person that took your information.  2. Exception #2 (Planned Surgery): In the event that you are scheduled by another doctor or dentist to have any type of surgery or procedure, you are allowed (for a period no longer than 30 days), to receive additional pain medication, for the acute post-op pain. However, in this case, you are responsible for picking up a copy of our "Post-op Pain Management for Surgeons" handout, and giving it to your surgeon or dentist. This document is available at our office, and does not require an appointment to obtain it. Simply go to our office during business hours (Monday-Thursday from 8:00 AM to 4:00 PM) (Friday 8:00 AM to 12:00 Noon) or if you have a scheduled appointment with [place], prior to your surgery, and ask for it by name. In addition, you will need to provide [place] with your name, name of your surgeon, type of surgery, and date of procedure or surgery.  *Opioid medications include: morphine, codeine, oxycodone, oxymorphone, hydrocodone, hydromorphone, meperidine, tramadol, tapentadol, buprenorphine, fentanyl, methadone. **Benzodiazepine medications include: diazepam (Valium), alprazolam (Xanax), clonazepam (Klonopine), lorazepam (Ativan), clorazepate (Tranxene), chlordiazepoxide (Librium), estazolam (Prosom), oxazepam (Serax), temazepam (Restoril), triazolam (Halcion)  ____________________________________________________________________________________________  BMI Assessment: Estimated body mass index is 40.74 kg/m as calculated from the following:   Height as of this encounter:  (1.6 m).   Weight as of this encounter: 230 lb (104.3 kg).  BMI interpretation table: BMI level Category Range association with higher incidence of chronic pain   <18 kg/m2 Underweight   18.5-24.9 kg/m2 Ideal body weight   25-29.9 kg/m2 Overweight Increased incidence by 20%  30-34.9 kg/m2 Obese (Class I) Increased incidence by 68%  35-39.9 kg/m2 Severe obesity (Class II) Increased incidence by 136%  >40 kg/m2 Extreme obesity (Class III) Increased incidence by 254%   BMI Readings from Last 4 Encounters:  08/31/17 40.74 kg/m  07/27/17 39.86 kg/m  06/22/17 40.57 kg/m  06/01/17 39.86 kg/m   Wt Readings from Last 4 Encounters:  08/31/17 230 lb (104.3 kg)  07/27/17 225 lb (102.1 kg)  06/22/17 229 lb (103.9 kg)  06/01/17 225 lb (102.1 kg)  Pain Management Discharge Instructions  General Discharge Instructions :  If you need to reach your doctor call: Monday-Friday 8:00 am - 4:00 pm at [phone] or toll free [phone].  After clinic hours [phone] to have operator reach doctor.  Bring all of your medication bottles to all your appointments in the pain clinic.  To cancel or reschedule your appointment with Pain Management please remember to call 24 hours in advance to avoid a fee.  Refer to the educational materials which you have been given on: General Risks, I had my Procedure. Discharge Instructions, Post Sedation.  Post Procedure Instructions:  The drugs you were given will stay in your system until tomorrow, so for the next 24 hours you should not drive, make any legal decisions or drink any alcoholic beverages.  You may eat anything you prefer, but it is better to start with liquids then soups and crackers, and gradually work up to solid foods.  Please notify your doctor immediately if you have any unusual bleeding, trouble breathing or pain that is not related to your normal pain.  Depending on the type of procedure that was done, some parts of your body may feel week and/or numb.  This usually  clears up by tonight or the next day.  Walk with the use of an assistive device or accompanied by an adult for the 24  hours.  You may use ice on the affected area for the first 24 hours.  Put ice in a Ziploc bag and cover with a towel and place against area 15 minutes on 15 minutes off.  You may switch to heat after 24 hours.

## 2017-08-31 NOTE — Progress Notes (Signed)
Nursing Pain Medication Assessment:  Safety precautions to be maintained throughout the outpatient stay will include: orient to surroundings, keep bed in low position, maintain call bell within reach at all times, provide assistance with transfer out of bed and ambulation.  Medication Inspection Compliance: Pill count conducted under aseptic conditions, in front of the patient. Neither the pills nor the bottle was removed from the patient's sight at any time. Once count was completed pills were immediately returned to the patient in their original bottle.  Medication: See above Pill/Patch Count: 51 of 90 pills remain Pill/Patch Appearance: Markings consistent with prescribed medication Bottle Appearance: Standard pharmacy container. Clearly labeled. Filled Date: 08 / 18 / 2018 Last Medication intake:  Today

## 2017-11-18 ENCOUNTER — Ambulatory Visit: Payer: Medicare Other | Attending: Nurse Practitioner | Admitting: Nurse Practitioner

## 2017-12-02 ENCOUNTER — Ambulatory Visit: Payer: Medicare Other | Admitting: Nurse Practitioner

## 2017-12-03 ENCOUNTER — Encounter: Payer: Self-pay | Admitting: Nurse Practitioner

## 2017-12-03 ENCOUNTER — Ambulatory Visit: Payer: Medicare Other | Attending: Nurse Practitioner | Admitting: Nurse Practitioner

## 2017-12-03 ENCOUNTER — Other Ambulatory Visit: Payer: Self-pay

## 2017-12-03 VITALS — BP 157/55 | HR 90 | Temp 98.0°F | Resp 16 | Ht 63.0 in | Wt 223.0 lb

## 2017-12-03 DIAGNOSIS — Z6841 Body Mass Index (BMI) 40.0 and over, adult: Secondary | ICD-10-CM | POA: Diagnosis not present

## 2017-12-03 DIAGNOSIS — Z79899 Other long term (current) drug therapy: Secondary | ICD-10-CM | POA: Insufficient documentation

## 2017-12-03 DIAGNOSIS — K824 Cholesterolosis of gallbladder: Secondary | ICD-10-CM | POA: Insufficient documentation

## 2017-12-03 DIAGNOSIS — G894 Chronic pain syndrome: Secondary | ICD-10-CM

## 2017-12-03 DIAGNOSIS — K219 Gastro-esophageal reflux disease without esophagitis: Secondary | ICD-10-CM | POA: Insufficient documentation

## 2017-12-03 DIAGNOSIS — M5412 Radiculopathy, cervical region: Secondary | ICD-10-CM | POA: Insufficient documentation

## 2017-12-03 DIAGNOSIS — Z981 Arthrodesis status: Secondary | ICD-10-CM | POA: Diagnosis not present

## 2017-12-03 DIAGNOSIS — N281 Cyst of kidney, acquired: Secondary | ICD-10-CM | POA: Diagnosis not present

## 2017-12-03 DIAGNOSIS — M25512 Pain in left shoulder: Secondary | ICD-10-CM | POA: Insufficient documentation

## 2017-12-03 DIAGNOSIS — M545 Low back pain: Secondary | ICD-10-CM | POA: Diagnosis present

## 2017-12-03 DIAGNOSIS — E785 Hyperlipidemia, unspecified: Secondary | ICD-10-CM | POA: Insufficient documentation

## 2017-12-03 DIAGNOSIS — M5481 Occipital neuralgia: Secondary | ICD-10-CM | POA: Diagnosis not present

## 2017-12-03 DIAGNOSIS — M4312 Spondylolisthesis, cervical region: Secondary | ICD-10-CM | POA: Insufficient documentation

## 2017-12-03 DIAGNOSIS — G8929 Other chronic pain: Secondary | ICD-10-CM | POA: Diagnosis not present

## 2017-12-03 DIAGNOSIS — Z79891 Long term (current) use of opiate analgesic: Secondary | ICD-10-CM | POA: Diagnosis not present

## 2017-12-03 DIAGNOSIS — M797 Fibromyalgia: Secondary | ICD-10-CM | POA: Diagnosis not present

## 2017-12-03 DIAGNOSIS — D860 Sarcoidosis of lung: Secondary | ICD-10-CM | POA: Insufficient documentation

## 2017-12-03 DIAGNOSIS — M25511 Pain in right shoulder: Secondary | ICD-10-CM | POA: Diagnosis not present

## 2017-12-03 DIAGNOSIS — N183 Chronic kidney disease, stage 3 (moderate): Secondary | ICD-10-CM | POA: Insufficient documentation

## 2017-12-03 DIAGNOSIS — M549 Dorsalgia, unspecified: Secondary | ICD-10-CM | POA: Diagnosis not present

## 2017-12-03 DIAGNOSIS — E1122 Type 2 diabetes mellitus with diabetic chronic kidney disease: Secondary | ICD-10-CM | POA: Insufficient documentation

## 2017-12-03 DIAGNOSIS — F339 Major depressive disorder, recurrent, unspecified: Secondary | ICD-10-CM | POA: Insufficient documentation

## 2017-12-03 DIAGNOSIS — E1142 Type 2 diabetes mellitus with diabetic polyneuropathy: Secondary | ICD-10-CM | POA: Diagnosis not present

## 2017-12-03 DIAGNOSIS — J449 Chronic obstructive pulmonary disease, unspecified: Secondary | ICD-10-CM | POA: Insufficient documentation

## 2017-12-03 DIAGNOSIS — M810 Age-related osteoporosis without current pathological fracture: Secondary | ICD-10-CM | POA: Insufficient documentation

## 2017-12-03 DIAGNOSIS — N3946 Mixed incontinence: Secondary | ICD-10-CM | POA: Insufficient documentation

## 2017-12-03 DIAGNOSIS — I130 Hypertensive heart and chronic kidney disease with heart failure and stage 1 through stage 4 chronic kidney disease, or unspecified chronic kidney disease: Secondary | ICD-10-CM | POA: Diagnosis not present

## 2017-12-03 DIAGNOSIS — I5032 Chronic diastolic (congestive) heart failure: Secondary | ICD-10-CM | POA: Diagnosis not present

## 2017-12-03 DIAGNOSIS — Z87891 Personal history of nicotine dependence: Secondary | ICD-10-CM | POA: Insufficient documentation

## 2017-12-03 DIAGNOSIS — E559 Vitamin D deficiency, unspecified: Secondary | ICD-10-CM | POA: Diagnosis not present

## 2017-12-03 DIAGNOSIS — M542 Cervicalgia: Secondary | ICD-10-CM | POA: Diagnosis not present

## 2017-12-03 MED ORDER — OXYCODONE HCL 5 MG PO TABS: 5.0000 mg | ORAL_TABLET | Freq: Three times a day (TID) | ORAL | 0 refills | Status: DC | PRN

## 2017-12-03 NOTE — Progress Notes (Signed)
Nursing Pain Medication Assessment:  Safety precautions to be maintained throughout the outpatient stay will include: orient to surroundings, keep bed in low position, maintain call bell within reach at all times, provide assistance with transfer out of bed and ambulation.  Medication Inspection Compliance: Pill count conducted under aseptic conditions, in front of the patient. Neither the pills nor the bottle was removed from the patient's sight at any time. Once count was completed pills were immediately returned to the patient in their original bottle.  Medication: See above Pill/Patch Count: 0 of 90 pills remain Pill/Patch Appearance: Markings consistent with prescribed medication Bottle Appearance: Standard pharmacy container. Clearly labeled. Filled Date: 5711 / 2028 / 2018 Last Medication intake:  12/01/17

## 2017-12-03 NOTE — Patient Instructions (Addendum)
You have been given a script for oxycodone x 3 today.  You have been instructed to get a UDS today.    ____________________________________________________________________________________________  Medication Rules  Applies to: All patients receiving prescriptions (written or electronic).  Pharmacy of record: Pharmacy where electronic prescriptions will be sent. If written prescriptions are taken to a different pharmacy, please inform the nursing staff. The pharmacy listed in the electronic medical record should be the one where you would like electronic prescriptions to be sent.  Prescription refills: Only during scheduled appointments. Applies to both, written and electronic prescriptions.  NOTE: The following applies primarily to controlled substances (Opioid* Pain Medications).   Patient's responsibilities: 1. Pain Pills: Bring all pain pills to every appointment (except for procedure appointments). 2. Pill Bottles: Bring pills in original pharmacy bottle. Always bring newest bottle. Bring bottle, even if empty. 3. Medication refills: You are responsible for knowing and keeping track of what medications you need refilled. The day before your appointment, write a list of all prescriptions that need to be refilled. Bring that list to your appointment and give it to the admitting nurse. Prescriptions will be written only during appointments. If you forget a medication, it will not be "Called in", "Faxed", or "electronically sent". You will need to get another appointment to get these prescribed. 4. Prescription Accuracy: You are responsible for carefully inspecting your prescriptions before leaving our office. Have the discharge nurse carefully go over each prescription with you, before taking them home. Make sure that your name is accurately spelled, that your address is correct. Check the name and dose of your medication to make sure it is accurate. Check the number of pills, and the written  instructions to make sure they are clear and accurate. Make sure that you are given enough medication to last until your next medication refill appointment. 5. Taking Medication: Take medication as prescribed. Never take more pills than instructed. Never take medication more frequently than prescribed. Taking less pills or less frequently is permitted and encouraged, when it comes to controlled substances (written prescriptions).  6. Inform other Doctors: Always inform, all of your healthcare providers, of all the medications you take. 7. Pain Medication from other Providers: You are not allowed to accept any additional pain medication from any other Doctor or Healthcare provider. There are two exceptions to this rule. (see below) In the event that you require additional pain medication, you are responsible for notifying us, as stated below. 8. Medication Agreement: You are responsible for carefully reading and following our Medication Agreement. This must be signed before receiving any prescriptions from our practice. Safely store a copy of your signed Agreement. Violations to the Agreement will result in no further prescriptions. (Additional copies of our Medication Agreement are available upon request.) 9. Laws, Rules, & Regulations: All patients are expected to follow all [address] and [employer], Rules, [employer]. Ignorance of the Laws does not constitute a valid excuse. The use of any illegal substances is prohibited. 10. Adopted CDC guidelines & recommendations: Target dosing levels will be at or below 60 MME/day. Use of benzodiazepines** is not recommended.  Exceptions: There are only two exceptions to the rule of not receiving pain medications from other Healthcare Providers. 1. Exception #1 (Emergencies): In the event of an emergency (i.e.: accident requiring emergency care), you are allowed to receive additional pain medication. However, you are responsible for: As soon as you are  able, call our office [phone], at any time of the day  or night, and leave a message stating your name, the date and nature of the emergency, and the name and dose of the medication prescribed. In the event that your call is answered by a member of our staff, make sure to document and save the date, time, and the name of the person that took your information.  2. Exception #2 (Planned Surgery): In the event that you are scheduled by another doctor or dentist to have any type of surgery or procedure, you are allowed (for a period no longer than 30 days), to receive additional pain medication, for the acute post-op pain. However, in this case, you are responsible for picking up a copy of our "Post-op Pain Management for Surgeons" handout, and giving it to your surgeon or dentist. This document is available at our office, and does not require an appointment to obtain it. Simply go to our office during business hours (Monday-Thursday from 8:00 AM to 4:00 PM) (Friday 8:00 AM to 12:00 Noon) or if you have a scheduled appointment with us, prior to your surgery, and ask for it by name. In addition, you will need to provide us with your name, name of your surgeon, type of surgery, and date of procedure or surgery.  *Opioid medications include: morphine, codeine, oxycodone, oxymorphone, hydrocodone, hydromorphone, meperidine, tramadol, tapentadol, buprenorphine, fentanyl, methadone. **Benzodiazepine medications include: diazepam (Valium), alprazolam (Xanax), clonazepam (Klonopine), lorazepam (Ativan), clorazepate (Tranxene), chlordiazepoxide (Librium), estazolam (Prosom), oxazepam (Serax), temazepam (Restoril), triazolam (Halcion)  ____________________________________________________________________________________________   ____________________________________________________________________________________________  Appointment Policy Summary  It is our goal and responsibility to provide the medical  community with assistance in the evaluation and management of patients with chronic pain. Unfortunately our resources are limited. Because we do not have an unlimited amount of time, or available appointments, we are required to closely monitor and manage their use. The following rules exist to maximize their use:  Patient's responsibilities: 1. Punctuality:  At what time should I arrive? You should be physically present in our office 30 minutes before your scheduled appointment. Your scheduled appointment is with your assigned healthcare provider. However, it takes 5-10 minutes to be "checked-in", and another 15 minutes for the nurses to do the admission. If you arrive to our office at the time you were given for your appointment, you will end up being at least 20-25 minutes late to your appointment with the provider. 2. Tardiness:  What happens if I arrive only a few minutes after my scheduled appointment time? You will need to reschedule your appointment. The cutoff is your appointment time. This is why it is so important that you arrive at least 30 minutes before that appointment. If you have an appointment scheduled for 10:00 AM and you arrive at 10:01, you will be required to reschedule your appointment.  3. Plan ahead:  Always assume that you will encounter traffic on your way in. Plan for it. If you are dependent on a driver, make sure they understand these rules and the need to arrive early. 4. Other appointments and responsibilities:  Avoid scheduling any other appointments before or after your pain clinic appointments.  5. Be prepared:  Write down everything that you need to discuss with your healthcare provider and give this information to the admitting nurse. Write down the medications that you will need refilled. Bring your pills and bottles (even the empty ones), to all of your appointments, except for those where a procedure is scheduled. 6. No children or pets:  Find someone to take  care  of them. It is not appropriate to bring them in. 7. Scheduling changes:  We request "advanced notification" of any changes or cancellations. 8. Advanced notification:  Defined as a time period of more than 24 hours prior to the originally scheduled appointment. This allows for the appointment to be offered to other patients. 9. Rescheduling:  When a visit is rescheduled, it will require the cancellation of the original appointment. For this reason they both fall within the category of "Cancellations".  10. Cancellations:  They require advanced notification. Any cancellation less than 24 hours before the  appointment will be recorded as a "No Show". 11. No Show:  Defined as an unkept appointment where the patient failed to notify or declare to the practice their intention or inability to keep the appointment.  Corrective process for repeat offenders:  1. Tardiness: Three (3) episodes of rescheduling due to late arrivals will be recorded as one (1) "No Show". 2. Cancellation or reschedule: Three (3) cancellations or rescheduling will be recorded as one (1) "No Show". 3. "No Shows": Three (3) "No Shows" within a 12 month period will result in discharge from the practice.  ____________________________________________________________________________________________    BMI interpretation table: BMI level Category Range association with higher incidence of chronic pain  <18 kg/m2 Underweight   18.5-24.9 kg/m2 Ideal body weight   25-29.9 kg/m2 Overweight Increased incidence by 20%  30-34.9 kg/m2 Obese (Class I) Increased incidence by 68%  35-39.9 kg/m2 Severe obesity (Class II) Increased incidence by 136%  >40 kg/m2 Extreme obesity (Class III) Increased incidence by 254%   BMI Readings from Last 4 Encounters:  12/03/17 39.50 kg/m  08/31/17 40.74 kg/m  07/27/17 39.86 kg/m  06/22/17 40.57 kg/m   Wt Readings from Last 4 Encounters:  12/03/17 223 lb (101.2 kg)  08/31/17 230 lb  (104.3 kg)  07/27/17 225 lb (102.1 kg)  06/22/17 229 lb (103.9 kg)

## 2017-12-03 NOTE — Progress Notes (Signed)
Patient's Name: Amber Acosta  MRN: [id number]  Referring Provider: Sharyne Peach, MD  DOB: 1951-10-01  PCP: Sharyne Peach, MD  DOS: 12/03/2017  Note by: Vevelyn Francois NP  Service setting: Ambulatory outpatient  Specialty: Interventional Pain Management  Location: [facility] (AMB) Pain Management Facility    Patient type: Established    Primary Reason(s) for Visit: Encounter for prescription drug management. (Level of risk: moderate)  CC: Back Pain (lower)  HPI  Amber Acosta is a 67 y.o. year old, female patient, who comes today for a medication management evaluation. She has Cervical vertebral fusion (Uncomplicated C5 through C7 ACDF.); Fibromyalgia; Lumbar facet joint pain; Lumbar facet syndrome (Bilateral) (R>L); Chronic kidney disease; COPD (chronic obstructive pulmonary disease) ([place]); Depression; Type 2 diabetes mellitus ([place]); Hypertensive heart disease with congestive heart failure ([place]); Gall bladder polyp; GERD (gastroesophageal reflux disease); Hyperlipemia; Inflammatory polyarthropathy ([place]); Sleep terror; Obesity, Class II, BMI 35-39.9, with comorbidity; OP (osteoporosis); Psoriasis; Kidney cysts; Muscle weakness (generalized); Sarcoidosis; Thrombocytopenia ([place]); Vitamin D deficiency; Chronic neck pain (Secondary source of pain) (Bilateral) (L>R); Cervicogenic headache (Left); Cervical facet syndrome (Left); At risk for falls; Balance problems; History of closed head injury; Opiate use (22.5 MME/Day); Long term current use of opiate analgesic; Encounter for therapeutic drug level monitoring; Long term prescription opiate use; Spondylolisthesis of cervical region; Cervical spine ankylosis; Hx of cervical spine surgery; Low magnesium levels; Myofascial pain; Neuropathy; Benign paroxysmal positional vertigo; Diabetic peripheral neuropathy ([place]); Meralgia paresthetica (Right); Cervical spondylosis; Chronic diastolic congestive heart failure ([place]); Mixed stress and urge urinary incontinence; Overactive  detrusor; 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[place]); Chronic low back pain (Primary Source of Pain) (Bilateral) (R>L); Chronic pain syndrome; Sarcoidosis of lung ([place]); Chronic upper back pain; Chronic Occipital neuralgia (Left); Epigastric pain; Displacement of cervical intervertebral disc without myelopathy; Essential hypertension; Hypertension; Morbid obesity with BMI of 40.0-44.9, adult ([employer]); Recurrent major depressive disorder, in partial remission ([place]); Right arm weakness; Acute postoperative pain; Disturbance of skin sensation; Cervical spondylitis with radiculitis (HCC); and Chronic shoulder pain (Tertiary source of pain) (Bilateral) (L>R) on their problem list. Her primarily concern today is the Back Pain (lower)  Pain Assessment: Location: Lower Back Radiating: na Onset: More than a month ago Duration: Chronic pain Quality: Aching, Throbbing, Constant, Discomfort Severity: 5 /10 (self-reported pain score)  Note: Reported level is compatible with observation.                          Effect on ADL: housework, vacuum, bending, twisting, lifting Timing: Constant Modifying factors: medictions, heat,cold, meds  Amber Acosta was last scheduled for an appointment on 12/02/2017 for medication management. During today's appointment we reviewed Amber Acosta's chronic pain status, as well as her outpatient medication regimen. She was seen today as an add-on. She states that she is going to discontinuing the Lyrica. She states that she will not be able to afford the cost. She had a fall in December on a wet floor. She states that she is having increased pain but she feels like it is related to increased coughing. She is under stress secondary to her husband having seizures.   The patient  reports that she does not use drugs. Her body mass index is 39.5 kg/m.  Further details on both, my assessment(s), as well as the proposed treatment plan, please see below.  Controlled Substance Pharmacotherapy  Assessment REMS (Risk Evaluation and Mitigation Strategy)  Analgesic:Oxycodone IR 5 mg every 8 hours (15 mg/day) MME/day:22.5  mg/day   [name], RN  12/03/2017  9:08 AM  Sign at close encounter Nursing Pain Medication Assessment:  Safety precautions to be maintained throughout the outpatient stay will include: orient to surroundings, keep bed in low position, maintain call bell within reach at all times, provide assistance with transfer out of bed and ambulation.  Medication Inspection Compliance: Pill count conducted under aseptic conditions, in front of the patient. Neither the pills nor the bottle was removed from the patient's sight at any time. Once count was completed pills were immediately returned to the patient in their original bottle.  Medication: See above Pill/Patch Count: 0 of 90 pills remain Pill/Patch Appearance: Markings consistent with prescribed medication Bottle Appearance: Standard pharmacy container. Clearly labeled. Filled Date: 63 / 28 / 2018 Last Medication intake:  12/01/17   Pharmacokinetics: Liberation and absorption (onset of action): WNL Distribution (time to peak effect): WNL Metabolism and excretion (duration of action): WNL         Pharmacodynamics: Desired effects: Analgesia: Amber Acosta reports >50% benefit. Functional ability: Patient reports that medication allows her to accomplish basic ADLs Clinically meaningful improvement in function (CMIF): Sustained CMIF goals met Perceived effectiveness: Described as relatively effective, allowing for increase in activities of daily living (ADL) Undesirable effects: Side-effects or Adverse reactions: None reported Monitoring: [place] PMP: Online review of the past 57-monthperiod conducted. Compliant with practice rules and regulations Last UDS on record: Summary  Date Value Ref Range Status  06/01/2017 FINAL  Final    Comment:     ==================================================================== TOXASSURE SELECT 13 (MW) ==================================================================== Test                             Result       Flag       Units Drug Present and Declared for Prescription Verification   Oxycodone                      3597         EXPECTED   ng/mg creat   Noroxycodone                   1965         EXPECTED   ng/mg creat    Sources of oxycodone include scheduled prescription medications.    Noroxycodone is an expected metabolite of oxycodone. ==================================================================== Test                      Result    Flag   Units      Ref Range   Creatinine              122              mg/dL      >=20 ==================================================================== Declared Medications:  The flagging and interpretation on this report are based on the  following declared medications.  Unexpected results may arise from  inaccuracies in the declared medications.  **Note: The testing scope of this panel includes these medications:  Oxycodone (Roxicodone)  **Note: The testing scope of this panel does not include following  reported medications:  Albuterol (Proventil)  Amlodipine  Budenoside (Symbicort)  Famotidine (Pepcid)  Fluoxetine (Prozac)  Formoterol (Symbicort)  Furosemide (Lasix)  Hydralazine (Apresoline)  Losartan (Cozaar)  Lovastatin (Mevacor)  Metoprolol (Toprol)  Nitroglycerin (Nitrostat)  Nystatin (Mycostatin)  Phentermine  Potassium (Klor-Con)  Pregabalin (Lyrica)  Tizanidine (Zanaflex)  Trazodone (Desyrel)  Vitamin D2 (Drisdol) ==================================================================== For clinical consultation, please call [phone]. ====================================================================    UDS interpretation: Compliant          Medication Assessment Form: Reviewed. Patient indicates being compliant with  therapy Treatment compliance: Compliant Risk Assessment Profile: Aberrant behavior: See prior evaluations. None observed or detected today Comorbid factors increasing risk of overdose: See prior notes. No additional risks detected today Risk of substance use disorder (SUD): Low Opioid Risk Tool - 12/03/17 0917      Family History of Substance Abuse   Alcohol  Negative    Illegal Drugs  Negative    Rx Drugs  Negative      Personal History of Substance Abuse   Illegal Drugs  Negative    Rx Drugs  Negative      Age   Age between 12-45 years   No      History of Preadolescent Sexual Abuse   History of Preadolescent Sexual Abuse  Negative or Female      Psychological Disease   Psychological Disease  Negative    Depression  Negative      Total Score   Opioid Risk Tool Scoring  0    Opioid Risk Interpretation  Low Risk      ORT Scoring interpretation table:  Score <3 = Low Risk for SUD  Score between 4-7 = Moderate Risk for SUD  Score >8 = High Risk for Opioid Abuse   Risk Mitigation Strategies:  Patient Counseling: Covered Patient-Prescriber Agreement (PPA): Present and active  Notification to other healthcare providers: Done  Pharmacologic Plan: No change in therapy, at this time.             Laboratory Chemistry  Inflammation Markers (CRP: Acute Phase) (ESR: Chronic Phase) Lab Results  Component Value Date   CRP 2.1 (H) 03/26/2016   ESRSEDRATE 52 (H) 03/26/2016                 Rheumatology Markers No results found for: RF, ANA, LABURIC, URICUR, LYMEIGGIGMAB, [facility]              Renal Function Markers Lab Results  Component Value Date   BUN 14 03/26/2016   CREATININE 1.28 (H) 03/26/2016   GFRAA 50 (L) 03/26/2016   GFRNONAA 44 (L) 03/26/2016                 Hepatic Function Markers Lab Results  Component Value Date   AST 24 03/26/2016   ALT 20 03/26/2016   ALBUMIN 4.0 03/26/2016   ALKPHOS 70 03/26/2016                 Electrolytes Lab Results   Component Value Date   NA 136 03/26/2016   K 3.8 03/26/2016   CL 100 (L) 03/26/2016   CALCIUM 8.5 (L) 03/26/2016   MG 2.1 06/22/2017                 Neuropathy Markers Lab Results  Component Value Date   VITAMINB12 628 06/22/2017                 Bone Pathology Markers Lab Results  Component Value Date   VD25OH 20.2 (L) 03/26/2016                 Coagulation Parameters Lab Results  Component Value Date   PLT 146 (L) 03/26/2016                 Cardiovascular Markers  No results found for: BNP, CKTOTAL, CKMB, TROPONINI, HGB, HCT               CA Markers No results found for: CEA, CA125, LABCA2               Note: Lab results reviewed.  Recent Diagnostic Imaging Results  MR CERVICAL SPINE WO CONTRAST CLINICAL DATA:  Chronic cervical spine pain. Cervical radiculitis. Previous fusion at C5-6 and C6-7.  EXAM: MRI CERVICAL SPINE WITHOUT CONTRAST  TECHNIQUE: Multiplanar, multisequence MR imaging of the cervical spine was performed. No intravenous contrast was administered.  COMPARISON:  CT scan dated 08/14/2015  FINDINGS: Alignment: Physiologic.  Vertebrae: Solid anterior fusion at C5-6 and C6-7.  Cord: Normal signal and morphology.  Posterior Fossa, vertebral arteries, paraspinal tissues: Negative.  Disc levels:  Craniocervical junction through C2-3: There is fusion of the C2 and C3 vertebral bodies as well as of the left facet joint at C2-3. There is no disc bulging or protrusion.  C3-4:  Moderate left facet arthritis.  The disc is normal.  C4-5: Normal disc. Mild to moderate left facet arthritis. Widely patent neural foramina.  C5-6 and C6-7: Solid anterior interbody fusion at each level with no residual impingement.  C7-T1: Tiny central disc bulge with no neural impingement. Widely patent neural foramina.  T1-2:  Normal.  T2-3: Benign hemangioma in the T2 vertebral body. Disc space narrowing with a small broad-based disc bulge without  neural impingement. Widely patent neural foramina.  IMPRESSION: 1. Facet arthritis at C3-4 and C4-5 on the left. 2. Solid anterior interbody fusions at C5-6 and C6-7. 3. Auto fusion at C2-3. 4. Tiny central disc bulge at C7-T1 with no neural impingement.  Electronically Signed   By: Lorriane Shire M.D.   On: 07/06/2017 11:10  Complexity Note: Imaging results reviewed. Results shared with Ms. Mcquain, using Layman's terms.                         Meds   Current Outpatient Medications:  .  albuterol (PROVENTIL HFA;VENTOLIN HFA) 108 (90 Base) MCG/ACT inhaler, Inhale 2 puffs into the lungs every 6 (six) hours as needed. , Disp: , Rfl:  .  amLODipine (NORVASC) 10 MG tablet, Take 10 mg by mouth daily. , Disp: , Rfl:  .  B-D INS SYR ULTRAFINE 1CC/30G 30G X 1/2" 1 ML MISC, , Disp: , Rfl:  .  B-D INS SYR ULTRAFINE 1CC/31G 31G X 5/16" 1 ML MISC, , Disp: , Rfl:  .  famotidine (PEPCID) 20 MG tablet, , Disp: , Rfl:  .  FLUoxetine (PROZAC) 40 MG capsule, Take 40 mg by mouth daily. , Disp: , Rfl:  .  furosemide (LASIX) 40 MG tablet, Take 40 mg by mouth daily., Disp: , Rfl:  .  insulin NPH Human (HUMULIN N,NOVOLIN N) 100 UNIT/ML injection, NPH inject 80 in the am prior to breakfast, and 40 units SQ  Units prior dinner., Disp: , Rfl:  .  Insulin Syringe-Needle U-100 (INSULIN SYRINGE 1CC/31GX5/16") 31G X 5/16" 1 ML MISC, Use once daily as directed by physician., Disp: , Rfl:  .  KLOR-CON 10 10 MEQ tablet, Take 10 mEq by mouth daily. , Disp: , Rfl:  .  losartan (COZAAR) 100 MG tablet, TAKE 1 TABLET (100 MG TOTAL) BY MOUTH ONCE DAILY., Disp: , Rfl:  .  lovastatin (MEVACOR) 10 MG tablet, Take 10 mg by mouth at bedtime., Disp: , Rfl:  .  metoprolol succinate (TOPROL-XL)  50 MG 24 hr tablet, Take 25 mg by mouth daily. , Disp: , Rfl:  .  nitroGLYCERIN (NITROSTAT) 0.3 MG SL tablet, 1 tablet sublingual prn for ESOPHAGEAL SPASMS(may repeat every 5 min, seek med help if pain persists after 3 tablets), Disp: , Rfl:   .  [START ON 03/06/2018] oxyCODONE (OXY IR/ROXICODONE) 5 MG immediate release tablet, Take 1 tablet (5 mg total) by mouth every 8 (eight) hours as needed for moderate pain or severe pain., Disp: 90 tablet, Rfl: 0 .  [START ON 02/04/2018] oxyCODONE (OXY IR/ROXICODONE) 5 MG immediate release tablet, Take 1 tablet (5 mg total) by mouth every 8 (eight) hours as needed for moderate pain or severe pain., Disp: 90 tablet, Rfl: 0 .  [START ON 12/06/2017] tiZANidine (ZANAFLEX) 4 MG tablet, Take 1 tablet (4 mg total) by mouth 2 (two) times daily as needed for muscle spasms., Disp: 60 tablet, Rfl: 2 .  traZODone (DESYREL) 100 MG tablet, Take 100 mg by mouth at bedtime. , Disp: , Rfl:  .  hydrALAZINE (APRESOLINE) 25 MG tablet, Take 25 mg by mouth 2 (two) times daily. , Disp: , Rfl:  .  insulin NPH Human (HUMULIN N,NOVOLIN N) 100 UNIT/ML injection, NPH inject SQ bid 60 units, Disp: , Rfl:  .  [START ON 01/05/2018] oxyCODONE (OXY IR/ROXICODONE) 5 MG immediate release tablet, Take 1 tablet (5 mg total) by mouth every 8 (eight) hours as needed for moderate pain or severe pain., Disp: 90 tablet, Rfl: 0  ROS  Constitutional: Denies any fever or chills Gastrointestinal: No reported hemesis, hematochezia, vomiting, or acute GI distress Musculoskeletal: Denies any acute onset joint swelling, redness, loss of ROM, or weakness Neurological: No reported episodes of acute onset apraxia, aphasia, dysarthria, agnosia, amnesia, paralysis, loss of coordination, or loss of consciousness  Allergies  Ms. Jerry is allergic to enbrel [etanercept]; metformin; methotrexate; mushroom extract complex; other; darvon [propoxyphene]; minocycline; and ciprofloxacin.  PFSH  Drug: Ms. Gimpel  reports that she does not use drugs. Alcohol:  reports that she does not drink alcohol. Tobacco:  reports that she has quit smoking. She has quit using smokeless tobacco. Medical:  has a past medical history of Arthritis, Cervical spondylosis without  myelopathy (09/21/2015), Chronic pain, Chronic pain associated with significant psychosocial dysfunction (08/28/2013), Depression, Diabetes mellitus without complication ([place]), Displacement of cervical intervertebral disc without myelopathy (09/05/2015), Fibromyalgia, GERD (gastroesophageal reflux disease), Hypertension, Kidney disease, chronic, stage III (GFR 30-59 ml/min) ([place]), Sarcoid, and Thrombopenia ([place]). Surgical: Ms. Crowl  has a past surgical history that includes neck fusion; Breast surgery; Back surgery; Knee arthroscopy; Fracture surgery; Hand surgery; and Abdominal hysterectomy. Family: family history includes Alcohol abuse in her brother; Alzheimer's disease in her mother; COPD in her father; Diabetes in her father; Heart disease in her father.  Constitutional Exam  General appearance: Well nourished, well developed, and well hydrated. In no apparent acute distress Vitals:   12/03/17 0908  BP: (!) 157/55  Pulse: 90  Resp: 16  Temp: 98 F (36.7 C)  SpO2: 98%  Weight: 223 lb (101.2 kg)  Height: _0  (1.6 m)   BMI Assessment: Estimated body mass index is 39.5 kg/m as calculated from the following:   Height as of this encounter: _1  (1.6 m).   Weight as of this encounter: 223 lb (101.2 kg). Psych/Mental status: Alert, oriented x 3 (person, place, & time)       Eyes: PERLA Respiratory: No evidence of acute respiratory distress  Cervical Spine Area Exam  Skin & Axial Inspection: No masses, redness, edema, swelling, or associated skin lesions Alignment: Symmetrical Functional ROM: Unrestricted ROM      Stability: No instability detected Muscle Tone/Strength: Functionally intact. No obvious neuro-muscular anomalies detected. Sensory (Neurological): Unimpaired Palpation: No palpable anomalies              Upper Extremity (UE) Exam    Side: Right upper extremity  Side: Left upper extremity  Skin & Extremity Inspection: Skin color, temperature, and hair growth are WNL. No  peripheral edema or cyanosis. No masses, redness, swelling, asymmetry, or associated skin lesions. No contractures.  Skin & Extremity Inspection: Skin color, temperature, and hair growth are WNL. No peripheral edema or cyanosis. No masses, redness, swelling, asymmetry, or associated skin lesions. No contractures.  Functional ROM: Unrestricted ROM          Functional ROM: Unrestricted ROM          Muscle Tone/Strength: Functionally intact. No obvious neuro-muscular anomalies detected.  Muscle Tone/Strength: Functionally intact. No obvious neuro-muscular anomalies detected.  Sensory (Neurological): Unimpaired          Sensory (Neurological): Unimpaired          Palpation: No palpable anomalies              Palpation: No palpable anomalies              Specialized Test(s): Deferred         Specialized Test(s): Deferred          Thoracic Spine Area Exam  Skin & Axial Inspection: No masses, redness, or swelling Alignment: Symmetrical Functional ROM: Unrestricted ROM Stability: No instability detected Muscle Tone/Strength: Functionally intact. No obvious neuro-muscular anomalies detected. Sensory (Neurological): Unimpaired Muscle strength & Tone: No palpable anomalies  Lumbar Spine Area Exam  Skin & Axial Inspection: No masses, redness, or swelling Alignment: Symmetrical Functional ROM: Unrestricted ROM      Stability: No instability detected Muscle Tone/Strength: Functionally intact. No obvious neuro-muscular anomalies detected. Sensory (Neurological): Unimpaired Palpation: Complains of area being tender to palpation       Provocative Tests: Lumbar Hyperextension and rotation test: Positive bilaterally for facet joint pain. Lumbar Lateral bending test: evaluation deferred today       Patrick's Maneuver: evaluation deferred today                    Gait & Posture Assessment  Ambulation: Unassisted Gait: Relatively normal for age and body habitus Posture: WNL   Lower Extremity Exam     Side: Right lower extremity  Side: Left lower extremity  Skin & Extremity Inspection: Skin color, temperature, and hair growth are WNL. No peripheral edema or cyanosis. No masses, redness, swelling, asymmetry, or associated skin lesions. No contractures.  Skin & Extremity Inspection: Skin color, temperature, and hair growth are WNL. No peripheral edema or cyanosis. No masses, redness, swelling, asymmetry, or associated skin lesions. No contractures.  Functional ROM: Unrestricted ROM          Functional ROM: Unrestricted ROM          Muscle Tone/Strength: Functionally intact. No obvious neuro-muscular anomalies detected.  Muscle Tone/Strength: Functionally intact. No obvious neuro-muscular anomalies detected.  Sensory (Neurological): Unimpaired  Sensory (Neurological): Unimpaired  Palpation: No palpable anomalies  Palpation: No palpable anomalies   Assessment  Primary Diagnosis & Pertinent Problem List: The primary encounter diagnosis was Chronic low back pain (Primary Source of Pain) (Bilateral) (R>L). Diagnoses of Chronic neck pain (Secondary source of pain) (  Bilateral) (L>R), Chronic upper back pain, Chronic pain syndrome, and Long term prescription opiate use were also pertinent to this visit.  Status Diagnosis  Controlled Controlled Controlled 1. Chronic low back pain (Primary Source of Pain) (Bilateral) (R>L)   2. Chronic neck pain (Secondary source of pain) (Bilateral) (L>R)   3. Chronic upper back pain   4. Chronic pain syndrome   5. Long term prescription opiate use     Problems updated and reviewed during this visit: No problems updated. Plan of Care  Pharmacotherapy (Medications Ordered): Meds ordered this encounter  Medications  . oxyCODONE (OXY IR/ROXICODONE) 5 MG immediate release tablet    Sig: Take 1 tablet (5 mg total) by mouth every 8 (eight) hours as needed for moderate pain or severe pain.    Dispense:  90 tablet    Refill:  0    Do not place this medication, or any  other prescription from our practice, on "Automatic Refill". Patient may have prescription filled one day early if pharmacy is closed on scheduled refill date. Do not fill until:03/06/2018 To last until:04/05/2018    Order Specific Question:   Supervising Provider    Answer:   Milinda Pointer [phone]  . oxyCODONE (OXY IR/ROXICODONE) 5 MG immediate release tablet    Sig: Take 1 tablet (5 mg total) by mouth every 8 (eight) hours as needed for moderate pain or severe pain.    Dispense:  90 tablet    Refill:  0    Do not place this medication, or any other prescription from our practice, on "Automatic Refill". Patient may have prescription filled one day early if pharmacy is closed on scheduled refill date. Do not fill until:02/04/2018 To last until: 03/06/2018    Order Specific Question:   Supervising Provider    Answer:   Milinda Pointer [phone]  . oxyCODONE (OXY IR/ROXICODONE) 5 MG immediate release tablet    Sig: Take 1 tablet (5 mg total) by mouth every 8 (eight) hours as needed for moderate pain or severe pain.    Dispense:  90 tablet    Refill:  0    Do not place this medication, or any other prescription from our practice, on "Automatic Refill". Patient may have prescription filled one day early if pharmacy is closed on scheduled refill date. Do not fill until:01/05/2018 To last until: 02/04/2018    Order Specific Question:   Supervising Provider    Answer:   Milinda Pointer [phone]  This SmartLink is deprecated. Use AVSMEDLIST instead to display the medication list for a patient. Medications administered today: Deliyah Peitz had no medications administered during this visit. Lab-work, procedure(s), and/or referral(s): Orders Placed This Encounter  Procedures  . ToxASSURE Select 13 (MW), Urine   Imaging and/or referral(s): None  Interventional management options: Planned, scheduled, and/or pending:  Not at this time.   Considering:  Palliativeleft sided cervical facet  block Diagnostic/palliative bilateral lumbar facet block Possible bilateral lumbar facet radiofrequency ablation Diagnostic/palliative left cervical facet block Possible left-sided cervical facet radiofrequency ablation   Palliative PRN treatment(s):  Diagnostic/palliative bilateral lumbar facet block Diagnostic/palliativeleft cervical facet block Palliativeleft sided cervical facet block   Provider-requested follow-up: Return in 4 months (on 03/24/2018) for MedMgmt with Me Donella Stade Edison Pace).  Future Appointments  Date Time Provider [place]  03/22/2018  8:30 AM Vevelyn Francois, NP [facility] None   Primary Care Physician: Sharyne Peach, MD Location: [facility] Outpatient Pain Management Facility Note by: Vevelyn Francois NP Date: 12/03/2017; Time: 2:24  PM  Pain Score Disclaimer: We use the NRS-11 scale. This is a self-reported, subjective measurement of pain severity with only modest accuracy. It is used primarily to identify changes within a particular patient. It must be understood that outpatient pain scales are significantly less accurate that those used for research, where they can be applied under ideal controlled circumstances with minimal exposure to variables. In reality, the score is likely to be a combination of pain intensity and pain affect, where pain affect describes the degree of emotional arousal or changes in action readiness caused by the sensory experience of pain. Factors such as social and work situation, setting, emotional state, anxiety levels, expectation, and prior pain experience may influence pain perception and show large inter-individual differences that may also be affected by time variables.  Patient instructions provided during this appointment: Patient Instructions   You have been given a script for oxycodone x 3 today.  You have been instructed to get a UDS today.     ____________________________________________________________________________________________  Medication Rules  Applies to: All patients receiving prescriptions (written or electronic).  Pharmacy of record: Pharmacy where electronic prescriptions will be sent. If written prescriptions are taken to a different pharmacy, please inform the nursing staff. The pharmacy listed in the electronic medical record should be the one where you would like electronic prescriptions to be sent.  Prescription refills: Only during scheduled appointments. Applies to both, written and electronic prescriptions.  NOTE: The following applies primarily to controlled substances (Opioid* Pain Medications).   Patient's responsibilities: 1. Pain Pills: Bring all pain pills to every appointment (except for procedure appointments). 2. Pill Bottles: Bring pills in original pharmacy bottle. Always bring newest bottle. Bring bottle, even if empty. 3. Medication refills: You are responsible for knowing and keeping track of what medications you need refilled. The day before your appointment, write a list of all prescriptions that need to be refilled. Bring that list to your appointment and give it to the admitting nurse. Prescriptions will be written only during appointments. If you forget a medication, it will not be "Called in", "Faxed", or "electronically sent". You will need to get another appointment to get these prescribed. 4. Prescription Accuracy: You are responsible for carefully inspecting your prescriptions before leaving our office. Have the discharge nurse carefully go over each prescription with you, before taking them home. Make sure that your name is accurately spelled, that your address is correct. Check the name and dose of your medication to make sure it is accurate. Check the number of pills, and the written instructions to make sure they are clear and accurate. Make sure that you are given enough medication to  last until your next medication refill appointment. 5. Taking Medication: Take medication as prescribed. Never take more pills than instructed. Never take medication more frequently than prescribed. Taking less pills or less frequently is permitted and encouraged, when it comes to controlled substances (written prescriptions).  6. Inform other Doctors: Always inform, all of your healthcare providers, of all the medications you take. 7. Pain Medication from other Providers: You are not allowed to accept any additional pain medication from any other Doctor or Healthcare provider. There are two exceptions to this rule. (see below) In the event that you require additional pain medication, you are responsible for notifying us, as stated below. 8. Medication Agreement: You are responsible for carefully reading and following our Medication Agreement. This must be signed before receiving any prescriptions from our practice. Safely store a copy of  your signed Agreement. Violations to the Agreement will result in no further prescriptions. (Additional copies of our Medication Agreement are available upon request.) 9. Laws, Rules, & Regulations: All patients are expected to follow all Federal and [employer], Rules, [employer]. Ignorance of the Laws does not constitute a valid excuse. The use of any illegal substances is prohibited. 10. Adopted CDC guidelines & recommendations: Target dosing levels will be at or below 60 MME/day. Use of benzodiazepines** is not recommended.  Exceptions: There are only two exceptions to the rule of not receiving pain medications from other Healthcare Providers. 1. Exception #1 (Emergencies): In the event of an emergency (i.e.: accident requiring emergency care), you are allowed to receive additional pain medication. However, you are responsible for: As soon as you are able, call our office [phone], at any time of the day or night, and leave a message stating your  name, the date and nature of the emergency, and the name and dose of the medication prescribed. In the event that your call is answered by a member of our staff, make sure to document and save the date, time, and the name of the person that took your information.  2. Exception #2 (Planned Surgery): In the event that you are scheduled by another doctor or dentist to have any type of surgery or procedure, you are allowed (for a period no longer than 30 days), to receive additional pain medication, for the acute post-op pain. However, in this case, you are responsible for picking up a copy of our "Post-op Pain Management for Surgeons" handout, and giving it to your surgeon or dentist. This document is available at our office, and does not require an appointment to obtain it. Simply go to our office during business hours (Monday-Thursday from 8:00 AM to 4:00 PM) (Friday 8:00 AM to 12:00 Noon) or if you have a scheduled appointment with [place], prior to your surgery, and ask for it by name. In addition, you will need to provide [place] with your name, name of your surgeon, type of surgery, and date of procedure or surgery.  *Opioid medications include: morphine, codeine, oxycodone, oxymorphone, hydrocodone, hydromorphone, meperidine, tramadol, tapentadol, buprenorphine, fentanyl, methadone. **Benzodiazepine medications include: diazepam (Valium), alprazolam (Xanax), clonazepam (Klonopine), lorazepam (Ativan), clorazepate (Tranxene), chlordiazepoxide (Librium), estazolam (Prosom), oxazepam (Serax), temazepam (Restoril), triazolam (Halcion)  ____________________________________________________________________________________________   ____________________________________________________________________________________________  Appointment Policy Summary  It is our goal and responsibility to provide the medical community with assistance in the evaluation and management of patients with chronic pain. Unfortunately our  resources are limited. Because we do not have an unlimited amount of time, or available appointments, we are required to closely monitor and manage their use. The following rules exist to maximize their use:  Patient's responsibilities: 1. Punctuality:  At what time should I arrive? You should be physically present in our office 30 minutes before your scheduled appointment. Your scheduled appointment is with your assigned healthcare provider. However, it takes 5-10 minutes to be "checked-in", and another 15 minutes for the nurses to do the admission. If you arrive to our office at the time you were given for your appointment, you will end up being at least 20-25 minutes late to your appointment with the provider. 2. Tardiness:  What happens if I arrive only a few minutes after my scheduled appointment time? You will need to reschedule your appointment. The cutoff is your appointment time. This is why it is so important that you arrive at least 30 minutes before  that appointment. If you have an appointment scheduled for 10:00 AM and you arrive at 10:01, you will be required to reschedule your appointment.  3. Plan ahead:  Always assume that you will encounter traffic on your way in. Plan for it. If you are dependent on a driver, make sure they understand these rules and the need to arrive early. 4. Other appointments and responsibilities:  Avoid scheduling any other appointments before or after your pain clinic appointments.  5. Be prepared:  Write down everything that you need to discuss with your healthcare provider and give this information to the admitting nurse. Write down the medications that you will need refilled. Bring your pills and bottles (even the empty ones), to all of your appointments, except for those where a procedure is scheduled. 6. No children or pets:  Find someone to take care of them. It is not appropriate to bring them in. 7. Scheduling changes:  We request "advanced  notification" of any changes or cancellations. 8. Advanced notification:  Defined as a time period of more than 24 hours prior to the originally scheduled appointment. This allows for the appointment to be offered to other patients. 9. Rescheduling:  When a visit is rescheduled, it will require the cancellation of the original appointment. For this reason they both fall within the category of "Cancellations".  10. Cancellations:  They require advanced notification. Any cancellation less than 24 hours before the  appointment will be recorded as a "No Show". 11. No Show:  Defined as an unkept appointment where the patient failed to notify or declare to the practice their intention or inability to keep the appointment.  Corrective process for repeat offenders:  1. Tardiness: Three (3) episodes of rescheduling due to late arrivals will be recorded as one (1) "No Show". 2. Cancellation or reschedule: Three (3) cancellations or rescheduling will be recorded as one (1) "No Show". 3. "No Shows": Three (3) "No Shows" within a 12 month period will result in discharge from the practice.  ____________________________________________________________________________________________    BMI interpretation table: BMI level Category Range association with higher incidence of chronic pain  <18 kg/m2 Underweight   18.5-24.9 kg/m2 Ideal body weight   25-29.9 kg/m2 Overweight Increased incidence by 20%  30-34.9 kg/m2 Obese (Class I) Increased incidence by 68%  35-39.9 kg/m2 Severe obesity (Class II) Increased incidence by 136%  >40 kg/m2 Extreme obesity (Class III) Increased incidence by 254%   BMI Readings from Last 4 Encounters:  12/03/17 39.50 kg/m  08/31/17 40.74 kg/m  07/27/17 39.86 kg/m  06/22/17 40.57 kg/m   Wt Readings from Last 4 Encounters:  12/03/17 223 lb (101.2 kg)  08/31/17 230 lb (104.3 kg)  07/27/17 225 lb (102.1 kg)  06/22/17 229 lb (103.9 kg)

## 2017-12-09 LAB — TOXASSURE SELECT 13 (MW), URINE

## 2018-02-28 ENCOUNTER — Other Ambulatory Visit: Payer: Self-pay | Admitting: Pain Medicine

## 2018-02-28 DIAGNOSIS — M7918 Myalgia, other site: Secondary | ICD-10-CM

## 2018-03-02 ENCOUNTER — Telehealth: Payer: Self-pay | Admitting: Pain Medicine

## 2018-03-02 NOTE — Telephone Encounter (Addendum)
Patient would like to have Cervical RFA asap, need to have order put in so [facility] can get this prior auth. Please check with Dr. Dorris CarnesN when he comes back on Monday. [facility]

## 2018-03-11 ENCOUNTER — Other Ambulatory Visit: Payer: Self-pay | Admitting: Nurse Practitioner

## 2018-03-11 DIAGNOSIS — R51 Headache: Secondary | ICD-10-CM

## 2018-03-11 DIAGNOSIS — M47812 Spondylosis without myelopathy or radiculopathy, cervical region: Secondary | ICD-10-CM

## 2018-03-11 DIAGNOSIS — G4486 Cervicogenic headache: Secondary | ICD-10-CM

## 2018-03-11 NOTE — Telephone Encounter (Signed)
Order in.

## 2018-03-11 NOTE — Telephone Encounter (Signed)
Left voicemail with patient that I need additional information from her about facet blocks she has received in order to get the RF scheduled.

## 2018-03-11 NOTE — Telephone Encounter (Signed)
The patient had a left side cervical facet RFA done on 06/22/17. If she wants to have the same side done, and had greater than 50% relief, he can order a repeat RFA on the left side. Just let me know what he wants to do.

## 2018-03-15 NOTE — Telephone Encounter (Signed)
I called the patient and she wants to repeat the left side. Can I get an order put in for a repeat left sided cervical facet RFA . She had greater than 50% relief from the one last July. I will need that put in so I can get insurance authorization. Thank you

## 2018-03-16 ENCOUNTER — Ambulatory Visit: Payer: Medicare Other | Attending: Nurse Practitioner | Admitting: Nurse Practitioner

## 2018-03-16 ENCOUNTER — Other Ambulatory Visit: Payer: Self-pay

## 2018-03-16 ENCOUNTER — Encounter: Payer: Self-pay | Admitting: Nurse Practitioner

## 2018-03-16 VITALS — BP 159/76 | HR 94 | Temp 98.4°F | Resp 18 | Ht 63.0 in | Wt 222.0 lb

## 2018-03-16 DIAGNOSIS — D869 Sarcoidosis, unspecified: Secondary | ICD-10-CM | POA: Diagnosis not present

## 2018-03-16 DIAGNOSIS — M25512 Pain in left shoulder: Secondary | ICD-10-CM | POA: Diagnosis not present

## 2018-03-16 DIAGNOSIS — H8110 Benign paroxysmal vertigo, unspecified ear: Secondary | ICD-10-CM | POA: Diagnosis not present

## 2018-03-16 DIAGNOSIS — M4322 Fusion of spine, cervical region: Secondary | ICD-10-CM | POA: Diagnosis not present

## 2018-03-16 DIAGNOSIS — M7918 Myalgia, other site: Secondary | ICD-10-CM | POA: Diagnosis not present

## 2018-03-16 DIAGNOSIS — R51 Headache: Secondary | ICD-10-CM | POA: Insufficient documentation

## 2018-03-16 DIAGNOSIS — Z794 Long term (current) use of insulin: Secondary | ICD-10-CM | POA: Insufficient documentation

## 2018-03-16 DIAGNOSIS — M47812 Spondylosis without myelopathy or radiculopathy, cervical region: Secondary | ICD-10-CM | POA: Insufficient documentation

## 2018-03-16 DIAGNOSIS — I5032 Chronic diastolic (congestive) heart failure: Secondary | ICD-10-CM | POA: Insufficient documentation

## 2018-03-16 DIAGNOSIS — Z8249 Family history of ischemic heart disease and other diseases of the circulatory system: Secondary | ICD-10-CM | POA: Insufficient documentation

## 2018-03-16 DIAGNOSIS — Z9889 Other specified postprocedural states: Secondary | ICD-10-CM | POA: Insufficient documentation

## 2018-03-16 DIAGNOSIS — Z811 Family history of alcohol abuse and dependence: Secondary | ICD-10-CM | POA: Insufficient documentation

## 2018-03-16 DIAGNOSIS — Z833 Family history of diabetes mellitus: Secondary | ICD-10-CM | POA: Insufficient documentation

## 2018-03-16 DIAGNOSIS — M4312 Spondylolisthesis, cervical region: Secondary | ICD-10-CM | POA: Diagnosis not present

## 2018-03-16 DIAGNOSIS — E785 Hyperlipidemia, unspecified: Secondary | ICD-10-CM | POA: Insufficient documentation

## 2018-03-16 DIAGNOSIS — N183 Chronic kidney disease, stage 3 (moderate): Secondary | ICD-10-CM | POA: Insufficient documentation

## 2018-03-16 DIAGNOSIS — E1122 Type 2 diabetes mellitus with diabetic chronic kidney disease: Secondary | ICD-10-CM | POA: Diagnosis not present

## 2018-03-16 DIAGNOSIS — Z90710 Acquired absence of both cervix and uterus: Secondary | ICD-10-CM | POA: Insufficient documentation

## 2018-03-16 DIAGNOSIS — Z79899 Other long term (current) drug therapy: Secondary | ICD-10-CM | POA: Diagnosis not present

## 2018-03-16 DIAGNOSIS — Z825 Family history of asthma and other chronic lower respiratory diseases: Secondary | ICD-10-CM | POA: Insufficient documentation

## 2018-03-16 DIAGNOSIS — Z886 Allergy status to analgesic agent status: Secondary | ICD-10-CM | POA: Insufficient documentation

## 2018-03-16 DIAGNOSIS — M5020 Other cervical disc displacement, unspecified cervical region: Secondary | ICD-10-CM | POA: Insufficient documentation

## 2018-03-16 DIAGNOSIS — I130 Hypertensive heart and chronic kidney disease with heart failure and stage 1 through stage 4 chronic kidney disease, or unspecified chronic kidney disease: Secondary | ICD-10-CM | POA: Diagnosis not present

## 2018-03-16 DIAGNOSIS — E559 Vitamin D deficiency, unspecified: Secondary | ICD-10-CM | POA: Insufficient documentation

## 2018-03-16 DIAGNOSIS — Z888 Allergy status to other drugs, medicaments and biological substances status: Secondary | ICD-10-CM | POA: Insufficient documentation

## 2018-03-16 DIAGNOSIS — F339 Major depressive disorder, recurrent, unspecified: Secondary | ICD-10-CM | POA: Diagnosis not present

## 2018-03-16 DIAGNOSIS — J449 Chronic obstructive pulmonary disease, unspecified: Secondary | ICD-10-CM | POA: Insufficient documentation

## 2018-03-16 DIAGNOSIS — Z91018 Allergy to other foods: Secondary | ICD-10-CM | POA: Insufficient documentation

## 2018-03-16 DIAGNOSIS — M25511 Pain in right shoulder: Secondary | ICD-10-CM | POA: Diagnosis not present

## 2018-03-16 DIAGNOSIS — Q6102 Congenital multiple renal cysts: Secondary | ICD-10-CM | POA: Insufficient documentation

## 2018-03-16 DIAGNOSIS — Z820 Family history of epilepsy and other diseases of the nervous system: Secondary | ICD-10-CM | POA: Insufficient documentation

## 2018-03-16 DIAGNOSIS — G894 Chronic pain syndrome: Secondary | ICD-10-CM | POA: Diagnosis present

## 2018-03-16 DIAGNOSIS — K219 Gastro-esophageal reflux disease without esophagitis: Secondary | ICD-10-CM | POA: Insufficient documentation

## 2018-03-16 DIAGNOSIS — Z79891 Long term (current) use of opiate analgesic: Secondary | ICD-10-CM | POA: Diagnosis not present

## 2018-03-16 MED ORDER — TIZANIDINE HCL 4 MG PO TABS
4.0000 mg | ORAL_TABLET | Freq: Two times a day (BID) | ORAL | 2 refills | Status: DC | PRN
Start: 2018-04-10 — End: 2018-06-17

## 2018-03-16 MED ORDER — OXYCODONE HCL 5 MG PO TABS: 5.0000 mg | ORAL_TABLET | Freq: Three times a day (TID) | ORAL | 0 refills | Status: DC | PRN

## 2018-03-16 NOTE — Progress Notes (Signed)
Nursing Pain Medication Assessment:  Safety precautions to be maintained throughout the outpatient stay will include: orient to surroundings, keep bed in low position, maintain call bell within reach at all times, provide assistance with transfer out of bed and ambulation.  Medication Inspection Compliance: Pill count conducted under aseptic conditions, in front of the patient. Neither the pills nor the bottle was removed from the patient's sight at any time. Once count was completed pills were immediately returned to the patient in their original bottle.  Medication: Oxycodone IR Pill/Patch Count: 71 of 90 pills remain Pill/Patch Appearance: Markings consistent with prescribed medication Bottle Appearance: Standard pharmacy container. Clearly labeled. Filled Date: 04 / 11 / 2019 Last Medication intake:  Yesterday

## 2018-03-16 NOTE — Progress Notes (Signed)
Patient's Name: Amber Acosta  MRN: [id number]  Referring Provider: Sharyne Peach, MD  DOB: 04-22-1951  PCP: Sharyne Peach, MD  DOS: 03/16/2018  Note by: Vevelyn Francois NP  Service setting: Ambulatory outpatient  Specialty: Interventional Pain Management  Location: [facility] (AMB) Pain Management Facility    Patient type: Established    Primary Reason(s) for Visit: Encounter for prescription drug management. (Level of risk: moderate)  CC: Headache and Neck Pain  HPI  Amber Acosta is a 67 y.o. year old, female patient, who comes today for a medication management evaluation. She has Cervical vertebral fusion (Uncomplicated C5 through C7 ACDF.); Fibromyalgia; Lumbar facet joint pain; Lumbar facet syndrome (Bilateral) (R>L); Stage 3 chronic kidney disease ([place]); COPD (chronic obstructive pulmonary disease) ([place]); Depression; Type 2 diabetes mellitus ([place]); Hypertensive heart disease with congestive heart failure ([place]); Gall bladder polyp; GERD (gastroesophageal reflux disease); Hyperlipemia; Inflammatory polyarthropathy ([place]); Sleep terror; Obesity, Class II, BMI 35-39.9, with comorbidity; OP (osteoporosis); Psoriasis; Kidney cysts; Muscle weakness (generalized); Sarcoidosis; Thrombocytopenia ([place]); Vitamin D deficiency; Chronic neck pain (Secondary source of pain) (Bilateral) (L>R); Cervicogenic headache (Left); Cervical facet syndrome (Left); At risk for falls; Balance problems; History of closed head injury; Opiate use (22.5 MME/Day); Long term current use of opiate analgesic; Encounter for therapeutic drug level monitoring; Long term prescription opiate use; Spondylolisthesis of cervical region; Cervical spine ankylosis; Hx of cervical spine surgery; Low magnesium levels; Myofascial pain; Neuropathy; Benign paroxysmal positional vertigo; Diabetic peripheral neuropathy ([place]); Meralgia paresthetica (Right); Cervical spondylosis; Chronic diastolic congestive heart failure ([place]); Mixed stress and urge urinary  incontinence; Overactive detrusor; 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[place]); Chronic low back pain (Primary Source of Pain) (Bilateral) (R>L); Chronic pain syndrome; Sarcoidosis of lung ([place]); Chronic upper back pain; Chronic Occipital neuralgia (Left); Epigastric pain; Displacement of cervical intervertebral disc without myelopathy; Essential hypertension; Hypertension; Morbid obesity with BMI of 40.0-44.9, adult ([place]); Recurrent major depressive disorder, in partial remission ([place]); Right arm weakness; Acute postoperative pain; Disturbance of skin sensation; Cervical spondylitis with radiculitis (HCC); and Chronic shoulder pain (Tertiary source of pain) (Bilateral) (L>R) on their problem list. Her primarily concern today is the Headache and Neck Pain  Pain Assessment: Location:   Head(neck) Radiating: denies Onset: More than a month ago Duration: Chronic pain Quality: Stabbing, Sharp Severity: 6 /10 (self-reported pain score)  Note: Reported level is compatible with observation. Clinically the patient looks like a 3/10 A 3/10 is viewed as "Moderate" and described as significantly interfering with activities of daily living (ADL). It becomes difficult to feed, bathe, get dressed, get on and off the toilet or to perform personal hygiene functions. Difficult to get in and out of bed or a chair without assistance. Very distracting. With effort, it can be ignored when deeply involved in activities. Information on the proper use of the pain scale provided to the patient today. When using our objective Pain Scale, levels between 6 and 10/10 are said to belong in an emergency room, as it progressively worsens from a 6/10, described as severely limiting, requiring emergency care not usually available at an outpatient pain management facility. At a 6/10 level, communication becomes difficult and requires great effort. Assistance to reach the emergency department may be required. Facial flushing and profuse sweating  along with potentially dangerous increases in heart rate and blood pressure will be evident. Timing: Constant(severity intermittent) Modifying factors: RFA  Amber Acosta was last scheduled for an appointment on 12/03/2017 for medication management. During today's appointment we reviewed Amber Acosta's  chronic pain status, as well as her outpatient medication regimen. She states that she has a kidney infection and therefore has had increased right sided low back pain. She admits that she continues on the anbx. She is also having increased cough secondary to her Sarcoidosis. She denies any specific pain concerns today . She is scheduled for have a Cervical RFA. She admits that she had this one year ago and it was effective. She admits that she has constant pain. She states that she has intense nerve pain that radiates with specific movements. She admits that he pain medication helps with some pressure but not the nerve pain.   The patient  reports that she does not use drugs. Her body mass index is 39.33 kg/m.  Further details on both, my assessment(s), as well as the proposed treatment plan, please see below.  Controlled Substance Pharmacotherapy Assessment REMS (Risk Evaluation and Mitigation Strategy)  Analgesic:Oxycodone IR 5 mg every 8 hours (15 mg/day) MME/day:22.5 mg/day    Amber Shorter, RN  03/16/2018  9:41 AM  Signed Nursing Pain Medication Assessment:  Safety precautions to be maintained throughout the outpatient stay will include: orient to surroundings, keep bed in low position, maintain call bell within reach at all times, provide assistance with transfer out of bed and ambulation.  Medication Inspection Compliance: Pill count conducted under aseptic conditions, in front of the patient. Neither the pills nor the bottle was removed from the patient's sight at any time. Once count was completed pills were immediately returned to the patient in their original bottle.  Medication: Oxycodone  IR Pill/Patch Count: 71 of 90 pills remain Pill/Patch Appearance: Markings consistent with prescribed medication Bottle Appearance: Standard pharmacy container. Clearly labeled. Filled Date: 04 / 11 / 2019 Last Medication intake:  Yesterday   Pharmacokinetics: Liberation and absorption (onset of action): WNL Distribution (time to peak effect): WNL Metabolism and excretion (duration of action): WNL         Pharmacodynamics: Desired effects: Analgesia: Ms. Elbe reports >50% benefit. Functional ability: Patient reports that medication allows her to accomplish basic ADLs Clinically meaningful improvement in function (CMIF): Sustained CMIF goals met Perceived effectiveness: Described as relatively effective, allowing for increase in activities of daily living (ADL) Undesirable effects: Side-effects or Adverse reactions: None reported Monitoring: [place] PMP: Online review of the past 43-monthperiod conducted. Compliant with practice rules and regulations Last UDS on record: Summary  Date Value Ref Range Status  12/03/2017 FINAL  Final    Comment:    ==================================================================== TOXASSURE SELECT 13 (MW) ==================================================================== Test                             Result       Flag       Units Drug Present not Declared for Prescription Verification   Noroxycodone                   141          UNEXPECTED ng/mg creat    Noroxycodone is an expected metabolite of oxycodone. Sources of    oxycodone include scheduled prescription medications. ==================================================================== Test                      Result    Flag   Units      Ref Range   Creatinine              83  mg/dL      >=20 ==================================================================== Declared Medications:  The flagging and interpretation on this report are based on the  following declared  medications.  Unexpected results may arise from  inaccuracies in the declared medications.  **Note: The testing scope of this panel does not include following  reported medications:  Albuterol  Amlodipine  Famotidine  Fluoxetine  Furosemide  Hydralazine  Insulin ==================================================================== For clinical consultation, please call [phone]. ====================================================================    UDS interpretation: Compliant          Medication Assessment Form: Reviewed. Patient indicates being compliant with therapy Treatment compliance: Compliant Risk Assessment Profile: Aberrant behavior: See prior evaluations. None observed or detected today Comorbid factors increasing risk of overdose: See prior notes. No additional risks detected today Risk of substance use disorder (SUD): Low Opioid Risk Tool - 03/16/18 0938      Family History of Substance Abuse   Alcohol  Positive Female    Illegal Drugs  Negative    Rx Drugs  Negative      Personal History of Substance Abuse   Alcohol  Negative    Illegal Drugs  Negative    Rx Drugs  Negative      Age   Age between 86-45 years   No      History of Preadolescent Sexual Abuse   History of Preadolescent Sexual Abuse  Negative or Female      Psychological Disease   Psychological Disease  Negative    Depression  Positive      Total Score   Opioid Risk Tool Scoring  2    Opioid Risk Interpretation  Low Risk      ORT Scoring interpretation table:  Score <3 = Low Risk for SUD  Score between 4-7 = Moderate Risk for SUD  Score >8 = High Risk for Opioid Abuse   Risk Mitigation Strategies:  Patient Counseling: Covered Patient-Prescriber Agreement (PPA): Present and active  Notification to other healthcare providers: Done  Pharmacologic Plan: No change in therapy, at this time.             Laboratory Chemistry  Inflammation Markers (CRP: Acute Phase) (ESR: Chronic  Phase) Lab Results  Component Value Date   CRP 2.1 (H) 03/26/2016   ESRSEDRATE 52 (H) 03/26/2016                         Rheumatology Markers No results found for: RF, ANA, LABURIC, URICUR, LYMEIGGIGMAB, [facility]                      Renal Function Markers Lab Results  Component Value Date   BUN 14 03/26/2016   CREATININE 1.28 (H) 03/26/2016   GFRAA 50 (L) 03/26/2016   GFRNONAA 44 (L) 03/26/2016                              Hepatic Function Markers Lab Results  Component Value Date   AST 24 03/26/2016   ALT 20 03/26/2016   ALBUMIN 4.0 03/26/2016   ALKPHOS 70 03/26/2016                        Electrolytes Lab Results  Component Value Date   NA 136 03/26/2016   K 3.8 03/26/2016   CL 100 (L) 03/26/2016   CALCIUM 8.5 (L) 03/26/2016   MG 2.1 06/22/2017  Neuropathy Markers Lab Results  Component Value Date   VITAMINB12 628 06/22/2017                        Bone Pathology Markers Lab Results  Component Value Date   VD25OH 20.2 (L) 03/26/2016                         Coagulation Parameters Lab Results  Component Value Date   PLT 146 (L) 03/26/2016                        Cardiovascular Markers No results found for: BNP, CKTOTAL, CKMB, TROPONINI, HGB, HCT                       CA Markers No results found for: CEA, CA125, LABCA2                      Note: Lab results reviewed.  Recent Diagnostic Imaging Results  MR CERVICAL SPINE WO CONTRAST CLINICAL DATA:  Chronic cervical spine pain. Cervical radiculitis. Previous fusion at C5-6 and C6-7.  EXAM: MRI CERVICAL SPINE WITHOUT CONTRAST  TECHNIQUE: Multiplanar, multisequence MR imaging of the cervical spine was performed. No intravenous contrast was administered.  COMPARISON:  CT scan dated 08/14/2015  FINDINGS: Alignment: Physiologic.  Vertebrae: Solid anterior fusion at C5-6 and C6-7.  Cord: Normal signal and morphology.  Posterior Fossa, vertebral arteries, paraspinal  tissues: Negative.  Disc levels:  Craniocervical junction through C2-3: There is fusion of the C2 and C3 vertebral bodies as well as of the left facet joint at C2-3. There is no disc bulging or protrusion.  C3-4:  Moderate left facet arthritis.  The disc is normal.  C4-5: Normal disc. Mild to moderate left facet arthritis. Widely patent neural foramina.  C5-6 and C6-7: Solid anterior interbody fusion at each level with no residual impingement.  C7-T1: Tiny central disc bulge with no neural impingement. Widely patent neural foramina.  T1-2:  Normal.  T2-3: Benign hemangioma in the T2 vertebral body. Disc space narrowing with a small broad-based disc bulge without neural impingement. Widely patent neural foramina.  IMPRESSION: 1. Facet arthritis at C3-4 and C4-5 on the left. 2. Solid anterior interbody fusions at C5-6 and C6-7. 3. Auto fusion at C2-3. 4. Tiny central disc bulge at C7-T1 with no neural impingement.  Electronically Signed   By: Lorriane Shire M.D.   On: 07/06/2017 11:10  Complexity Note: Imaging results reviewed. Results shared with Ms. Caracci, using Layman's terms.                         Meds   Current Outpatient Medications:  .  ADVAIR DISKUS 250-50 MCG/DOSE AEPB, , Disp: , Rfl:  .  albuterol (PROVENTIL HFA;VENTOLIN HFA) 108 (90 Base) MCG/ACT inhaler, Inhale 2 puffs into the lungs every 6 (six) hours as needed. , Disp: , Rfl:  .  amLODipine (NORVASC) 10 MG tablet, Take 10 mg by mouth daily. , Disp: , Rfl:  .  B-D INS SYR ULTRAFINE 1CC/30G 30G X 1/2" 1 ML MISC, , Disp: , Rfl:  .  B-D INS SYR ULTRAFINE 1CC/31G 31G X 5/16" 1 ML MISC, , Disp: , Rfl:  .  famotidine (PEPCID) 20 MG tablet, , Disp: , Rfl:  .  FLUoxetine (PROZAC) 40 MG capsule, Take 40 mg by mouth daily. ,  Disp: , Rfl:  .  furosemide (LASIX) 40 MG tablet, Take 40 mg by mouth daily., Disp: , Rfl:  .  hydrALAZINE (APRESOLINE) 25 MG tablet, Take 25 mg by mouth 2 (two) times daily. , Disp: , Rfl:   .  insulin NPH Human (HUMULIN N,NOVOLIN N) 100 UNIT/ML injection, NPH inject SQ bid 80 in the am and 40 units at night, Disp: , Rfl:  .  insulin NPH Human (HUMULIN N,NOVOLIN N) 100 UNIT/ML injection, NPH inject 80 in the am prior to breakfast, and 40 units SQ  Units prior dinner., Disp: , Rfl:  .  Insulin Syringe-Needle U-100 (INSULIN SYRINGE 1CC/31GX5/16") 31G X 5/16" 1 ML MISC, Use once daily as directed by physician., Disp: , Rfl:  .  KLOR-CON 10 10 MEQ tablet, Take 10 mEq by mouth daily. , Disp: , Rfl:  .  losartan (COZAAR) 100 MG tablet, TAKE 1 TABLET (100 MG TOTAL) BY MOUTH ONCE DAILY., Disp: , Rfl:  .  lovastatin (MEVACOR) 10 MG tablet, Take 10 mg by mouth at bedtime., Disp: , Rfl:  .  metoprolol succinate (TOPROL-XL) 50 MG 24 hr tablet, Take 25 mg by mouth daily. , Disp: , Rfl:  .  nitroGLYCERIN (NITROSTAT) 0.3 MG SL tablet, 1 tablet sublingual prn for ESOPHAGEAL SPASMS(may repeat every 5 min, seek med help if pain persists after 3 tablets), Disp: , Rfl:  .  [START ON 05/10/2018] oxyCODONE (OXY IR/ROXICODONE) 5 MG immediate release tablet, Take 1 tablet (5 mg total) by mouth every 8 (eight) hours as needed for moderate pain or severe pain., Disp: 90 tablet, Rfl: 0 .  [START ON 04/10/2018] tiZANidine (ZANAFLEX) 4 MG tablet, Take 1 tablet (4 mg total) by mouth 2 (two) times daily as needed for muscle spasms., Disp: 60 tablet, Rfl: 2 .  traZODone (DESYREL) 100 MG tablet, Take 100 mg by mouth at bedtime. , Disp: , Rfl:  .  [START ON 06/09/2018] oxyCODONE (OXY IR/ROXICODONE) 5 MG immediate release tablet, Take 1 tablet (5 mg total) by mouth every 8 (eight) hours as needed for moderate pain or severe pain., Disp: 90 tablet, Rfl: 0 .  [START ON 04/10/2018] oxyCODONE (OXY IR/ROXICODONE) 5 MG immediate release tablet, Take 1 tablet (5 mg total) by mouth every 8 (eight) hours as needed for moderate pain or severe pain., Disp: 90 tablet, Rfl: 0  ROS  Constitutional: Denies any fever or  chills Gastrointestinal: No reported hemesis, hematochezia, vomiting, or acute GI distress Musculoskeletal: Denies any acute onset joint swelling, redness, loss of ROM, or weakness Neurological: No reported episodes of acute onset apraxia, aphasia, dysarthria, agnosia, amnesia, paralysis, loss of coordination, or loss of consciousness  Allergies  Ms. Coole is allergic to aspirin; ciprofloxacin; etanercept; metformin; methotrexate; mushroom extract complex; other; darvon [propoxyphene]; minocycline; and cobalamine combinations.  PFSH  Drug: Ms. Zipper  reports that she does not use drugs. Alcohol:  reports that she does not drink alcohol. Tobacco:  reports that she has quit smoking. She has quit using smokeless tobacco. Medical:  has a past medical history of Arthritis, Cervical spondylosis without myelopathy (09/21/2015), Chronic pain, Chronic pain associated with significant psychosocial dysfunction (08/28/2013), Depression, Diabetes mellitus without complication ([place]), Displacement of cervical intervertebral disc without myelopathy (09/05/2015), Fibromyalgia, GERD (gastroesophageal reflux disease), Hypertension, Kidney disease, chronic, stage III (GFR 30-59 ml/min) ([place]), Sarcoid, and Thrombopenia ([place]). Surgical: Ms. Velarde  has a past surgical history that includes neck fusion; Breast surgery; Back surgery; Knee arthroscopy; Fracture surgery; Hand surgery; and Abdominal hysterectomy. Family:  family history includes Alcohol abuse in her brother; Alzheimer's disease in her mother; COPD in her father; Diabetes in her father; Heart disease in her father.  Constitutional Exam  General appearance: Well nourished, well developed, and well hydrated. In no apparent acute distress Vitals:   03/16/18 0926  BP: (!) 159/76  Pulse: 94  Resp: 18  Temp: 98.4 F (36.9 C)  SpO2: 100%  Weight: 222 lb (100.7 kg)  Height: '5\' 3"'$  (1.6 m)  Psych/Mental status: Alert, oriented x 3 (person, place, & time)        Eyes: PERLA Respiratory: No evidence of acute respiratory distress  Cervical Spine Area Exam  Skin & Axial Inspection: Well healed scar from previous spine surgery detected Alignment: Symmetrical Functional ROM: Unrestricted ROM      Stability: No instability detected Muscle Tone/Strength: Functionally intact. No obvious neuro-muscular anomalies detected. Sensory (Neurological): Unimpaired Palpation: Complains of area being tender to palpation              Upper Extremity (UE) Exam    Side: Right upper extremity  Side: Left upper extremity  Skin & Extremity Inspection: Skin color, temperature, and hair growth are WNL. No peripheral edema or cyanosis. No masses, redness, swelling, asymmetry, or associated skin lesions. No contractures.  Skin & Extremity Inspection: Skin color, temperature, and hair growth are WNL. No peripheral edema or cyanosis. No masses, redness, swelling, asymmetry, or associated skin lesions. No contractures.  Functional ROM: Unrestricted ROM          Functional ROM: Unrestricted ROM          Muscle Tone/Strength: Functionally intact. No obvious neuro-muscular anomalies detected.  Muscle Tone/Strength: Functionally intact. No obvious neuro-muscular anomalies detected.  Sensory (Neurological): Unimpaired          Sensory (Neurological): Unimpaired          Palpation: No palpable anomalies              Palpation: No palpable anomalies              Specialized Test(s): Deferred         Specialized Test(s): Deferred          Thoracic Spine Area Exam  Skin & Axial Inspection: No masses, redness, or swelling Alignment: Symmetrical Functional ROM: Unrestricted ROM Stability: No instability detected Muscle Tone/Strength: Functionally intact. No obvious neuro-muscular anomalies detected. Sensory (Neurological): Unimpaired Muscle strength & Tone: No palpable anomalies  Lumbar Spine Area Exam  Skin & Axial Inspection: No masses, redness, or swelling Alignment:  Symmetrical Functional ROM: Unrestricted ROM      Stability: No instability detected Muscle Tone/Strength: Functionally intact. No obvious neuro-muscular anomalies detected. Sensory (Neurological): Unimpaired Palpation: No palpable anomalies       Provocative Tests: Lumbar Hyperextension and rotation test: evaluation deferred today       Lumbar Lateral bending test: evaluation deferred today       Patrick's Maneuver: evaluation deferred today                    Gait & Posture Assessment  Ambulation: Unassisted Gait: Relatively normal for age and body habitus Posture: WNL   Lower Extremity Exam    Side: Right lower extremity  Side: Left lower extremity  Skin & Extremity Inspection: Skin color, temperature, and hair growth are WNL. No peripheral edema or cyanosis. No masses, redness, swelling, asymmetry, or associated skin lesions. No contractures.  Skin & Extremity Inspection: Skin color, temperature, and hair growth are WNL.  No peripheral edema or cyanosis. No masses, redness, swelling, asymmetry, or associated skin lesions. No contractures.  Functional ROM: Unrestricted ROM          Functional ROM: Unrestricted ROM          Muscle Tone/Strength: Functionally intact. No obvious neuro-muscular anomalies detected.  Muscle Tone/Strength: Functionally intact. No obvious neuro-muscular anomalies detected.  Sensory (Neurological): Unimpaired  Sensory (Neurological): Unimpaired  Palpation: No palpable anomalies  Palpation: No palpable anomalies   Assessment  Primary Diagnosis & Pertinent Problem List: The primary encounter diagnosis was Cervical spondylosis. Diagnoses of Spondylolisthesis of cervical region, Myofascial pain, Chronic pain syndrome, and Long term prescription opiate use were also pertinent to this visit.  Status Diagnosis  Persistent Persistent Controlled 1. Cervical spondylosis   2. Spondylolisthesis of cervical region   3. Myofascial pain   4. Chronic pain syndrome   5.  Long term prescription opiate use     Problems updated and reviewed during this visit: Problem  Stage 3 chronic kidney disease (HCC)   Overview:  Stage III.  Baseline Cr 1.3.  Overview:  Stage III.  Baseline Cr 1.3.   Hyperlipemia   Last Assessment & Plan:  Well controlled. Patient is not fasting today but will nedd repeat at future visit. Last Assessment & Plan:  Well controlled. Patient is not fasting today but will nedd repeat at future visit.  Last Assessment & Plan:  Well controlled. Patient is not fasting today but will nedd repeat at future visit.  Last Assessment & Plan:  Well controlled. Patient is not fasting today but will nedd repeat at future visit.    Plan of Care  Pharmacotherapy (Medications Ordered): Meds ordered this encounter  Medications  . oxyCODONE (OXY IR/ROXICODONE) 5 MG immediate release tablet    Sig: Take 1 tablet (5 mg total) by mouth every 8 (eight) hours as needed for moderate pain or severe pain.    Dispense:  90 tablet    Refill:  0    Do not place this medication, or any other prescription from our practice, on "Automatic Refill". Patient may have prescription filled one day early if pharmacy is closed on scheduled refill date. Do not fill until:06/09/2018  To last until:07/09/2018    Order Specific Question:   Supervising Provider    Answer:   Milinda Pointer [phone]  . oxyCODONE (OXY IR/ROXICODONE) 5 MG immediate release tablet    Sig: Take 1 tablet (5 mg total) by mouth every 8 (eight) hours as needed for moderate pain or severe pain.    Dispense:  90 tablet    Refill:  0    Do not place this medication, or any other prescription from our practice, on "Automatic Refill". Patient may have prescription filled one day early if pharmacy is closed on scheduled refill date. Do not fill until:05/10/2018 To last until:06/09/2018    Order Specific Question:   Supervising Provider    Answer:   Milinda Pointer [phone]  . oxyCODONE (OXY  IR/ROXICODONE) 5 MG immediate release tablet    Sig: Take 1 tablet (5 mg total) by mouth every 8 (eight) hours as needed for moderate pain or severe pain.    Dispense:  90 tablet    Refill:  0    Do not place this medication, or any other prescription from our practice, on "Automatic Refill". Patient may have prescription filled one day early if pharmacy is closed on scheduled refill date. Do not fill until:04/10/2018 To last until: 05/10/2018  Order Specific Question:   Supervising Provider    Answer:   Milinda Pointer [phone]  . tiZANidine (ZANAFLEX) 4 MG tablet    Sig: Take 1 tablet (4 mg total) by mouth 2 (two) times daily as needed for muscle spasms.    Dispense:  60 tablet    Refill:  2    Do not place this medication, or any other prescription from our practice, on "Automatic Refill". Patient may have prescription filled one day early if pharmacy is closed on scheduled refill date.    Order Specific Question:   Supervising Provider    Answer:   Milinda Pointer [phone]   New Prescriptions   No medications on file   Medications administered today: Devlin [facility] had no medications administered during this visit. Lab-work, procedure(s), and/or referral(s): Orders Placed This Encounter  Procedures  . ToxASSURE Select 13 (MW), Urine   Imaging and/or referral(s): None  Interventional management options: Planned, scheduled, and/or pending:   Palliative Left-sided cervical facet radiofrequency ablation   Considering:  Palliativeleft sided cervical facet block Diagnostic/palliative bilateral lumbar facet block Possible bilateral lumbar facet radiofrequency ablation Diagnostic/palliative left cervical facet block Possible left-sided cervical facet radiofrequency ablation   Palliative PRN treatment(s):  Diagnostic/palliative bilateral lumbar facet block Diagnostic/palliativeleft cervical facet block Palliativeleft sided cervical facet block       Provider-requested follow-up: Return in about 3 months (around 06/15/2018) for MedMgmt with Me Donella St[facility]).  Future Appointments  Date Time Provider [place]  05/04/2018  2:15 PM Milinda Pointer, MD [facility] None  06/15/2018  8:30 AM Vevelyn Francois, NP [facility] None   Primary Care Physician: Sharyne Peach, MD Location: [facility] Note by: Vevelyn Francois NP Date: 03/16/2018; Time: 1:01 PM  Pain Score Disclaimer: We use the NRS-11 scale. This is a self-reported, subjective measurement of pain severity with only modest accuracy. It is used primarily to identify changes within a particular patient. It must be understood that outpatient pain scales are significantly less accurate that those used for research, where they can be applied under ideal controlled circumstances with minimal exposure to variables. In reality, the score is likely to be a combination of pain intensity and pain affect, where pain affect describes the degree of emotional arousal or changes in action readiness caused by the sensory experience of pain. Factors such as social and work situation, setting, emotional state, anxiety levels, expectation, and prior pain experience may influence pain perception and show large inter-individual differences that may also be affected by time variables.  Patient instructions provided during this appointment: Patient Instructions   __3 prescriptions given today at office for _Oxycodone. 1 prescription was sent to your pharmacy. _________________________________________________________________________________________  Medication Rules  Applies to: All patients receiving prescriptions (written or electronic).  Pharmacy of record: Pharmacy where electronic prescriptions will be sent. If written prescriptions are taken to a different pharmacy, please inform the nursing staff. The pharmacy listed in the electronic medical record should be  the one where you would like electronic prescriptions to be sent.  Prescription refills: Only during scheduled appointments. Applies to both, written and electronic prescriptions.  NOTE: The following applies primarily to controlled substances (Opioid* Pain Medications).   Patient's responsibilities: 1. Pain Pills: Bring all pain pills to every appointment (except for procedure appointments). 2. Pill Bottles: Bring pills in original pharmacy bottle. Always bring newest bottle. Bring bottle, even if empty. 3. Medication refills: You are responsible for knowing and keeping track of what medications you  need refilled. The day before your appointment, write a list of all prescriptions that need to be refilled. Bring that list to your appointment and give it to the admitting nurse. Prescriptions will be written only during appointments. If you forget a medication, it will not be "Called in", "Faxed", or "electronically sent". You will need to get another appointment to get these prescribed. 4. Prescription Accuracy: You are responsible for carefully inspecting your prescriptions before leaving our office. Have the discharge nurse carefully go over each prescription with you, before taking them home. Make sure that your name is accurately spelled, that your address is correct. Check the name and dose of your medication to make sure it is accurate. Check the number of pills, and the written instructions to make sure they are clear and accurate. Make sure that you are given enough medication to last until your next medication refill appointment. 5. Taking Medication: Take medication as prescribed. Never take more pills than instructed. Never take medication more frequently than prescribed. Taking less pills or less frequently is permitted and encouraged, when it comes to controlled substances (written prescriptions).  6. Inform other Doctors: Always inform, all of your healthcare providers, of all the  medications you take. 7. Pain Medication from other Providers: You are not allowed to accept any additional pain medication from any other Doctor or Healthcare provider. There are two exceptions to this rule. (see below) In the event that you require additional pain medication, you are responsible for notifying us, as stated below. 8. Medication Agreement: You are responsible for carefully reading and following our Medication Agreement. This must be signed before receiving any prescriptions from our practice. Safely store a copy of your signed Agreement. Violations to the Agreement will result in no further prescriptions. (Additional copies of our Medication Agreement are available upon request.) 9. Laws, Rules, & Regulations: All patients are expected to follow all Federal and [employer], Rules, [employer]. Ignorance of the Laws does not constitute a valid excuse. The use of any illegal substances is prohibited. 10. Adopted CDC guidelines & recommendations: Target dosing levels will be at or below 60 MME/day. Use of benzodiazepines** is not recommended.  Exceptions: There are only two exceptions to the rule of not receiving pain medications from other Healthcare Providers. 1. Exception #1 (Emergencies): In the event of an emergency (i.e.: accident requiring emergency care), you are allowed to receive additional pain medication. However, you are responsible for: As soon as you are able, call our office [phone], at any time of the day or night, and leave a message stating your name, the date and nature of the emergency, and the name and dose of the medication prescribed. In the event that your call is answered by a member of our staff, make sure to document and save the date, time, and the name of the person that took your information.  2. Exception #2 (Planned Surgery): In the event that you are scheduled by another doctor or dentist to have any type of surgery or procedure, you are  allowed (for a period no longer than 30 days), to receive additional pain medication, for the acute post-op pain. However, in this case, you are responsible for picking up a copy of our "Post-op Pain Management for Surgeons" handout, and giving it to your surgeon or dentist. This document is available at our office, and does not require an appointment to obtain it. Simply go to our office during business hours (Monday-Thursday from 8:00 AM  to 4:00 PM) (Friday 8:00 AM to 12:00 Noon) or if you have a scheduled appointment with [place], prior to your surgery, and ask for it by name. In addition, you will need to provide [place] with your name, name of your surgeon, type of surgery, and date of procedure or surgery.  *Opioid medications include: morphine, codeine, oxycodone, oxymorphone, hydrocodone, hydromorphone, meperidine, tramadol, tapentadol, buprenorphine, fentanyl, methadone. **Benzodiazepine medications include: diazepam (Valium), alprazolam (Xanax), clonazepam (Klonopine), lorazepam (Ativan), clorazepate (Tranxene), chlordiazepoxide (Librium), estazolam (Prosom), oxazepam (Serax), temazepam (Restoril), triazolam (Halcion) (Last updated: 01/28/2018) ____________________________________________________________________________________________   BMI Assessment: Estimated body mass index is 39.33 kg/m as calculated from the following:   Height as of this encounter: '5\' 3"'$  (1.6 m).   Weight as of this encounter: 222 lb (100.7 kg).  BMI interpretation table: BMI level Category Range association with higher incidence of chronic pain  <18 kg/m2 Underweight   18.5-24.9 kg/m2 Ideal body weight   25-29.9 kg/m2 Overweight Increased incidence by 20%  30-34.9 kg/m2 Obese (Class I) Increased incidence by 68%  35-39.9 kg/m2 Severe obesity (Class II) Increased incidence by 136%  >40 kg/m2 Extreme obesity (Class III) Increased incidence by 254%   BMI Readings from Last 4 Encounters:  03/16/18 39.33 kg/m  12/03/17  39.50 kg/m  08/31/17 40.74 kg/m  07/27/17 39.86 kg/m   Wt Readings from Last 4 Encounters:  03/16/18 222 lb (100.7 kg)  12/03/17 223 lb (101.2 kg)  08/31/17 230 lb (104.3 kg)  07/27/17 225 lb (102.1 kg)   ____________________________________________________________________________________________  Pain Scale  Introduction: The pain score used by this practice is the Verbal Numerical Rating Scale (VNRS-11). This is an 11-point scale. It is for adults and children 10 years or older. There are significant differences in how the pain score is reported, used, and applied. Forget everything you learned in the past and learn this scoring system.  General Information: The scale should reflect your current level of pain. Unless you are specifically asked for the level of your worst pain, or your average pain. If you are asked for one of these two, then it should be understood that it is over the past 24 hours.  Basic Activities of Daily Living (ADL): Personal hygiene, dressing, eating, transferring, and using restroom.  Instructions: Most patients tend to report their level of pain as a combination of two factors, their physical pain and their psychosocial pain. This last one is also known as "suffering" and it is reflection of how physical pain affects you socially and psychologically. From now on, report them separately. From this point on, when asked to report your pain level, report only your physical pain. Use the following table for reference.  Pain Clinic Pain Levels (0-5/10)  Pain Level Score  Description  No Pain 0   Mild pain 1 Nagging, annoying, but does not interfere with basic activities of daily living (ADL). Patients are able to eat, bathe, get dressed, toileting (being able to get on and off the toilet and perform personal hygiene functions), transfer (move in and out of bed or a chair without assistance), and maintain continence (able to control bladder and bowel functions). Blood  pressure and heart rate are unaffected. A normal heart rate for a healthy adult ranges from 60 to 100 bpm (beats per minute).   Mild to moderate pain 2 Noticeable and distracting. Impossible to hide from other people. More frequent flare-ups. Still possible to adapt and function close to normal. It can be very annoying and may have occasional stronger  flare-ups. With discipline, patients may get used to it and adapt.   Moderate pain 3 Interferes significantly with activities of daily living (ADL). It becomes difficult to feed, bathe, get dressed, get on and off the toilet or to perform personal hygiene functions. Difficult to get in and out of bed or a chair without assistance. Very distracting. With effort, it can be ignored when deeply involved in activities.   Moderately severe pain 4 Impossible to ignore for more than a few minutes. With effort, patients may still be able to manage work or participate in some social activities. Very difficult to concentrate. Signs of autonomic nervous system discharge are evident: dilated pupils (mydriasis); mild sweating (diaphoresis); sleep interference. Heart rate becomes elevated (>115 bpm). Diastolic blood pressure (lower number) rises above 100 mmHg. Patients find relief in laying down and not moving.   Severe pain 5 Intense and extremely unpleasant. Associated with frowning face and frequent crying. Pain overwhelms the senses.  Ability to do any activity or maintain social relationships becomes significantly limited. Conversation becomes difficult. Pacing back and forth is common, as getting into a comfortable position is nearly impossible. Pain wakes you up from deep sleep. Physical signs will be obvious: pupillary dilation; increased sweating; goosebumps; brisk reflexes; cold, clammy hands and feet; nausea, vomiting or dry heaves; loss of appetite; significant sleep disturbance with inability to fall asleep or to remain asleep. When persistent, significant  weight loss is observed due to the complete loss of appetite and sleep deprivation.  Blood pressure and heart rate becomes significantly elevated. Caution: If elevated blood pressure triggers a pounding headache associated with blurred vision, then the patient should immediately seek attention at an urgent or emergency care unit, as these may be signs of an impending stroke.    Emergency Department Pain Levels (6-10/10)  Emergency Room Pain 6 Severely limiting. Requires emergency care and should not be seen or managed at an outpatient pain management facility. Communication becomes difficult and requires great effort. Assistance to reach the emergency department may be required. Facial flushing and profuse sweating along with potentially dangerous increases in heart rate and blood pressure will be evident.   Distressing pain 7 Self-care is very difficult. Assistance is required to transport, or use restroom. Assistance to reach the emergency department will be required. Tasks requiring coordination, such as bathing and getting dressed become very difficult.   Disabling pain 8 Self-care is no longer possible. At this level, pain is disabling. The individual is unable to do even the most "basic" activities such as walking, eating, bathing, dressing, transferring to a bed, or toileting. Fine motor skills are lost. It is difficult to think clearly.   Incapacitating pain 9 Pain becomes incapacitating. Thought processing is no longer possible. Difficult to remember your own name. Control of movement and coordination are lost.   The worst pain imaginable 10 At this level, most patients pass out from pain. When this level is reached, collapse of the autonomic nervous system occurs, leading to a sudden drop in blood pressure and heart rate. This in turn results in a temporary and dramatic drop in blood flow to the brain, leading to a loss of consciousness. Fainting is one of the body's self defense mechanisms.  Passing out puts the brain in a calmed state and causes it to shut down for a while, in order to begin the healing process.    Summary: 1. Refer to this scale when providing [place] with your pain level. 2. Be accurate and  careful when reporting your pain level. This will help with your care. 3. Over-reporting your pain level will lead to loss of credibility. 4. Even a level of 1/10 means that there is pain and will be treated at our facility. 5. High, inaccurate reporting will be documented as "Symptom Exaggeration", leading to loss of credibility and suspicions of possible secondary gains such as obtaining more narcotics, or wanting to appear disabled, for fraudulent reasons. 6. Only pain levels of 5 or below will be seen at our facility. 7. Pain levels of 6 and above will be sent to the Emergency Department and the appointment cancelled. ____________________________________________________________________________________________

## 2018-03-16 NOTE — Patient Instructions (Addendum)
__3 prescriptions given today at office for _Oxycodone. 1 prescription was sent to your pharmacy. _________________________________________________________________________________________  Medication Rules  Applies to: All patients receiving prescriptions (written or electronic).  Pharmacy of record: Pharmacy where electronic prescriptions will be sent. If written prescriptions are taken to a different pharmacy, please inform the nursing staff. The pharmacy listed in the electronic medical record should be the one where you would like electronic prescriptions to be sent.  Prescription refills: Only during scheduled appointments. Applies to both, written and electronic prescriptions.  NOTE: The following applies primarily to controlled substances (Opioid* Pain Medications).   Patient's responsibilities: 1. Pain Pills: Bring all pain pills to every appointment (except for procedure appointments). 2. Pill Bottles: Bring pills in original pharmacy bottle. Always bring newest bottle. Bring bottle, even if empty. 3. Medication refills: You are responsible for knowing and keeping track of what medications you need refilled. The day before your appointment, write a list of all prescriptions that need to be refilled. Bring that list to your appointment and give it to the admitting nurse. Prescriptions will be written only during appointments. If you forget a medication, it will not be "Called in", "Faxed", or "electronically sent". You will need to get another appointment to get these prescribed. 4. Prescription Accuracy: You are responsible for carefully inspecting your prescriptions before leaving our office. Have the discharge nurse carefully go over each prescription with you, before taking them home. Make sure that your name is accurately spelled, that your address is correct. Check the name and dose of your medication to make sure it is accurate. Check the number of pills, and the written instructions  to make sure they are clear and accurate. Make sure that you are given enough medication to last until your next medication refill appointment. 5. Taking Medication: Take medication as prescribed. Never take more pills than instructed. Never take medication more frequently than prescribed. Taking less pills or less frequently is permitted and encouraged, when it comes to controlled substances (written prescriptions).  6. Inform other Doctors: Always inform, all of your healthcare providers, of all the medications you take. 7. Pain Medication from other Providers: You are not allowed to accept any additional pain medication from any other Doctor or Healthcare provider. There are two exceptions to this rule. (see below) In the event that you require additional pain medication, you are responsible for notifying us, as stated below. 8. Medication Agreement: You are responsible for carefully reading and following our Medication Agreement. This must be signed before receiving any prescriptions from our practice. Safely store a copy of your signed Agreement. Violations to the Agreement will result in no further prescriptions. (Additional copies of our Medication Agreement are available upon request.) 9. Laws, Rules, & Regulations: All patients are expected to follow all [address] and [employer], Rules, [place] [employer]. Ignorance of the Laws does not constitute a valid excuse. The use of any illegal substances is prohibited. 10. Adopted CDC guidelines & recommendations: Target dosing levels will be at or below 60 MME/day. Use of benzodiazepines** is not recommended.  Exceptions: There are only two exceptions to the rule of not receiving pain medications from other Healthcare Providers. 1. Exception #1 (Emergencies): In the event of an emergency (i.e.: accident requiring emergency care), you are allowed to receive additional pain medication. However, you are responsible for: As soon as you are able, call our  office [phone], at any time of the day or night, and leave a message stating your name,  the date and nature of the emergency, and the name and dose of the medication prescribed. In the event that your call is answered by a member of our staff, make sure to document and save the date, time, and the name of the person that took your information.  2. Exception #2 (Planned Surgery): In the event that you are scheduled by another doctor or dentist to have any type of surgery or procedure, you are allowed (for a period no longer than 30 days), to receive additional pain medication, for the acute post-op pain. However, in this case, you are responsible for picking up a copy of our "Post-op Pain Management for Surgeons" handout, and giving it to your surgeon or dentist. This document is available at our office, and does not require an appointment to obtain it. Simply go to our office during business hours (Monday-Thursday from 8:00 AM to 4:00 PM) (Friday 8:00 AM to 12:00 Noon) or if you have a scheduled appointment with [place], prior to your surgery, and ask for it by name. In addition, you will need to provide [place] with your name, name of your surgeon, type of surgery, and date of procedure or surgery.  *Opioid medications include: morphine, codeine, oxycodone, oxymorphone, hydrocodone, hydromorphone, meperidine, tramadol, tapentadol, buprenorphine, fentanyl, methadone. **Benzodiazepine medications include: diazepam (Valium), alprazolam (Xanax), clonazepam (Klonopine), lorazepam (Ativan), clorazepate (Tranxene), chlordiazepoxide (Librium), estazolam (Prosom), oxazepam (Serax), temazepam (Restoril), triazolam (Halcion) (Last updated: 01/28/2018) ____________________________________________________________________________________________   BMI Assessment: Estimated body mass index is 39.33 kg/m as calculated from the following:   Height as of this encounter: 5\' 3"  (1.6 m).   Weight as of this encounter: 222 lb  (100.7 kg).  BMI interpretation table: BMI level Category Range association with higher incidence of chronic pain  <18 kg/m2 Underweight   18.5-24.9 kg/m2 Ideal body weight   25-29.9 kg/m2 Overweight Increased incidence by 20%  30-34.9 kg/m2 Obese (Class I) Increased incidence by 68%  35-39.9 kg/m2 Severe obesity (Class II) Increased incidence by 136%  >40 kg/m2 Extreme obesity (Class III) Increased incidence by 254%   BMI Readings from Last 4 Encounters:  03/16/18 39.33 kg/m  12/03/17 39.50 kg/m  08/31/17 40.74 kg/m  07/27/17 39.86 kg/m   Wt Readings from Last 4 Encounters:  03/16/18 222 lb (100.7 kg)  12/03/17 223 lb (101.2 kg)  08/31/17 230 lb (104.3 kg)  07/27/17 225 lb (102.1 kg)   ____________________________________________________________________________________________  Pain Scale  Introduction: The pain score used by this practice is the Verbal Numerical Rating Scale (VNRS-11). This is an 11-point scale. It is for adults and children 10 years or older. There are significant differences in how the pain score is reported, used, and applied. Forget everything you learned in the past and learn this scoring system.  General Information: The scale should reflect your current level of pain. Unless you are specifically asked for the level of your worst pain, or your average pain. If you are asked for one of these two, then it should be understood that it is over the past 24 hours.  Basic Activities of Daily Living (ADL): Personal hygiene, dressing, eating, transferring, and using restroom.  Instructions: Most patients tend to report their level of pain as a combination of two factors, their physical pain and their psychosocial pain. This last one is also known as "suffering" and it is reflection of how physical pain affects you socially and psychologically. From now on, report them separately. From this point on, when asked to report your pain level, report  only your physical  pain. Use the following table for reference.  Pain Clinic Pain Levels (0-5/10)  Pain Level Score  Description  No Pain 0   Mild pain 1 Nagging, annoying, but does not interfere with basic activities of daily living (ADL). Patients are able to eat, bathe, get dressed, toileting (being able to get on and off the toilet and perform personal hygiene functions), transfer (move in and out of bed or a chair without assistance), and maintain continence (able to control bladder and bowel functions). Blood pressure and heart rate are unaffected. A normal heart rate for a healthy adult ranges from 60 to 100 bpm (beats per minute).   Mild to moderate pain 2 Noticeable and distracting. Impossible to hide from other people. More frequent flare-ups. Still possible to adapt and function close to normal. It can be very annoying and may have occasional stronger flare-ups. With discipline, patients may get used to it and adapt.   Moderate pain 3 Interferes significantly with activities of daily living (ADL). It becomes difficult to feed, bathe, get dressed, get on and off the toilet or to perform personal hygiene functions. Difficult to get in and out of bed or a chair without assistance. Very distracting. With effort, it can be ignored when deeply involved in activities.   Moderately severe pain 4 Impossible to ignore for more than a few minutes. With effort, patients may still be able to manage work or participate in some social activities. Very difficult to concentrate. Signs of autonomic nervous system discharge are evident: dilated pupils (mydriasis); mild sweating (diaphoresis); sleep interference. Heart rate becomes elevated (>115 bpm). Diastolic blood pressure (lower number) rises above 100 mmHg. Patients find relief in laying down and not moving.   Severe pain 5 Intense and extremely unpleasant. Associated with frowning face and frequent crying. Pain overwhelms the senses.  Ability to do any activity or maintain  social relationships becomes significantly limited. Conversation becomes difficult. Pacing back and forth is common, as getting into a comfortable position is nearly impossible. Pain wakes you up from deep sleep. Physical signs will be obvious: pupillary dilation; increased sweating; goosebumps; brisk reflexes; cold, clammy hands and feet; nausea, vomiting or dry heaves; loss of appetite; significant sleep disturbance with inability to fall asleep or to remain asleep. When persistent, significant weight loss is observed due to the complete loss of appetite and sleep deprivation.  Blood pressure and heart rate becomes significantly elevated. Caution: If elevated blood pressure triggers a pounding headache associated with blurred vision, then the patient should immediately seek attention at an urgent or emergency care unit, as these may be signs of an impending stroke.    Emergency Department Pain Levels (6-10/10)  Emergency Room Pain 6 Severely limiting. Requires emergency care and should not be seen or managed at an outpatient pain management facility. Communication becomes difficult and requires great effort. Assistance to reach the emergency department may be required. Facial flushing and profuse sweating along with potentially dangerous increases in heart rate and blood pressure will be evident.   Distressing pain 7 Self-care is very difficult. Assistance is required to transport, or use restroom. Assistance to reach the emergency department will be required. Tasks requiring coordination, such as bathing and getting dressed become very difficult.   Disabling pain 8 Self-care is no longer possible. At this level, pain is disabling. The individual is unable to do even the most "basic" activities such as walking, eating, bathing, dressing, transferring to a bed, or toileting. Fine motor  skills are lost. It is difficult to think clearly.   Incapacitating pain 9 Pain becomes incapacitating. Thought  processing is no longer possible. Difficult to remember your own name. Control of movement and coordination are lost.   The worst pain imaginable 10 At this level, most patients pass out from pain. When this level is reached, collapse of the autonomic nervous system occurs, leading to a sudden drop in blood pressure and heart rate. This in turn results in a temporary and dramatic drop in blood flow to the brain, leading to a loss of consciousness. Fainting is one of the body's self defense mechanisms. Passing out puts the brain in a calmed state and causes it to shut down for a while, in order to begin the healing process.    Summary: 1. Refer to this scale when providing us with your pain level. 2. Be accurate and careful when reporting your pain level. This will help with your care. 3. Over-reporting your pain level will lead to loss of credibility. 4. Even a level of 1/10 means that there is pain and will be treated at our facility. 5. High, inaccurate reporting will be documented as "Symptom Exaggeration", leading to loss of credibility and suspicions of possible secondary gains such as obtaining more narcotics, or wanting to appear disabled, for fraudulent reasons. 6. Only pain levels of 5 or below will be seen at our facility. 7. Pain levels of 6 and above will be sent to the Emergency Department and the appointment cancelled. ____________________________________________________________________________________________

## 2018-03-18 ENCOUNTER — Encounter: Payer: Medicare Other | Admitting: Nurse Practitioner

## 2018-03-20 LAB — TOXASSURE SELECT 13 (MW), URINE

## 2018-03-22 ENCOUNTER — Encounter: Payer: Medicare Other | Admitting: Nurse Practitioner

## 2018-05-04 ENCOUNTER — Ambulatory Visit (HOSPITAL_BASED_OUTPATIENT_CLINIC_OR_DEPARTMENT_OTHER): Payer: Medicare Other | Admitting: Pain Medicine

## 2018-05-04 ENCOUNTER — Ambulatory Visit: Payer: Medicare Other | Admitting: Pain Medicine

## 2018-05-04 ENCOUNTER — Encounter: Payer: Self-pay | Admitting: Pain Medicine

## 2018-05-04 ENCOUNTER — Ambulatory Visit
Admission: RE | Admit: 2018-05-04 | Discharge: 2018-05-04 | Disposition: A | Discharge status: Home / Self Care | Payer: Medicare Other | Source: Ambulatory Visit | Attending: Pain Medicine | Admitting: Pain Medicine

## 2018-05-04 ENCOUNTER — Other Ambulatory Visit: Payer: Self-pay

## 2018-05-04 VITALS — BP 102/52 | HR 69 | Temp 97.3°F | Resp 16 | Ht 63.0 in | Wt 222.0 lb

## 2018-05-04 DIAGNOSIS — M47812 Spondylosis without myelopathy or radiculopathy, cervical region: Secondary | ICD-10-CM | POA: Diagnosis present

## 2018-05-04 DIAGNOSIS — G8929 Other chronic pain: Secondary | ICD-10-CM | POA: Insufficient documentation

## 2018-05-04 DIAGNOSIS — R001 Bradycardia, unspecified: Secondary | ICD-10-CM | POA: Insufficient documentation

## 2018-05-04 DIAGNOSIS — I9589 Other hypotension: Secondary | ICD-10-CM

## 2018-05-04 DIAGNOSIS — I959 Hypotension, unspecified: Secondary | ICD-10-CM | POA: Insufficient documentation

## 2018-05-04 DIAGNOSIS — M549 Dorsalgia, unspecified: Secondary | ICD-10-CM | POA: Insufficient documentation

## 2018-05-04 DIAGNOSIS — Z881 Allergy status to other antibiotic agents status: Secondary | ICD-10-CM | POA: Diagnosis not present

## 2018-05-04 DIAGNOSIS — R51 Headache: Secondary | ICD-10-CM | POA: Diagnosis not present

## 2018-05-04 DIAGNOSIS — R55 Syncope and collapse: Secondary | ICD-10-CM | POA: Insufficient documentation

## 2018-05-04 DIAGNOSIS — M542 Cervicalgia: Secondary | ICD-10-CM | POA: Insufficient documentation

## 2018-05-04 DIAGNOSIS — G8918 Other acute postprocedural pain: Secondary | ICD-10-CM | POA: Diagnosis not present

## 2018-05-04 DIAGNOSIS — Z886 Allergy status to analgesic agent status: Secondary | ICD-10-CM | POA: Insufficient documentation

## 2018-05-04 DIAGNOSIS — Z90710 Acquired absence of both cervix and uterus: Secondary | ICD-10-CM | POA: Insufficient documentation

## 2018-05-04 DIAGNOSIS — R110 Nausea: Secondary | ICD-10-CM | POA: Insufficient documentation

## 2018-05-04 DIAGNOSIS — Z9889 Other specified postprocedural states: Secondary | ICD-10-CM | POA: Insufficient documentation

## 2018-05-04 DIAGNOSIS — G4486 Cervicogenic headache: Secondary | ICD-10-CM

## 2018-05-04 MED ORDER — DEXAMETHASONE SODIUM PHOSPHATE 10 MG/ML IJ SOLN
INTRAMUSCULAR | Status: AC
  Filled 2018-05-04: qty 1

## 2018-05-04 MED ORDER — HYDROCODONE-ACETAMINOPHEN 5-325 MG PO TABS: 1.0000 | ORAL_TABLET | Freq: Three times a day (TID) | ORAL | 0 refills | Status: AC | PRN

## 2018-05-04 MED ORDER — MIDAZOLAM HCL 5 MG/5ML IJ SOLN
1.0000 mg | INTRAMUSCULAR | Status: DC | PRN
  Administered 2018-05-04: 3 mg via INTRAVENOUS

## 2018-05-04 MED ORDER — LIDOCAINE HCL (PF) 2 % IJ SOLN
INTRAMUSCULAR | Status: AC
  Filled 2018-05-04: qty 10

## 2018-05-04 MED ORDER — EPHEDRINE SULFATE 50 MG/ML IJ SOLN: 10.0000 mg | Freq: Once | INTRAMUSCULAR | Status: DC

## 2018-05-04 MED ORDER — ROPIVACAINE HCL 2 MG/ML IJ SOLN
9.0000 mL | Freq: Once | INTRAMUSCULAR | Status: AC
  Administered 2018-05-04: 10 mL via PERINEURAL

## 2018-05-04 MED ORDER — ONDANSETRON HCL 4 MG/2ML IJ SOLN
INTRAMUSCULAR | Status: AC
  Filled 2018-05-04: qty 2

## 2018-05-04 MED ORDER — DEXAMETHASONE SODIUM PHOSPHATE 10 MG/ML IJ SOLN
10.0000 mg | Freq: Once | INTRAMUSCULAR | Status: AC
  Administered 2018-05-04: 10 mg

## 2018-05-04 MED ORDER — ROPIVACAINE HCL 2 MG/ML IJ SOLN
INTRAMUSCULAR | Status: AC
  Filled 2018-05-04: qty 10

## 2018-05-04 MED ORDER — MIDAZOLAM HCL 5 MG/5ML IJ SOLN
INTRAMUSCULAR | Status: AC
  Filled 2018-05-04: qty 5

## 2018-05-04 MED ORDER — STERILE WATER FOR INJECTION IJ SOLN
INTRAMUSCULAR | Status: AC
  Filled 2018-05-04: qty 10

## 2018-05-04 MED ORDER — ONDANSETRON HCL 4 MG/2ML IJ SOLN: 4.0000 mg | INTRAMUSCULAR | Status: DC | PRN

## 2018-05-04 MED ORDER — GLYCOPYRROLATE 0.2 MG/ML IJ SOLN
INTRAMUSCULAR | Status: AC
  Filled 2018-05-04: qty 1

## 2018-05-04 MED ORDER — FENTANYL CITRATE (PF) 100 MCG/2ML IJ SOLN
25.0000 ug | INTRAMUSCULAR | Status: DC | PRN
  Administered 2018-05-04: 100 ug via INTRAVENOUS

## 2018-05-04 MED ORDER — EPHEDRINE SULFATE 50 MG/ML IJ SOLN
INTRAMUSCULAR | Status: AC
  Filled 2018-05-04: qty 1

## 2018-05-04 MED ORDER — LACTATED RINGERS IV SOLN
1000.0000 mL | Freq: Once | INTRAVENOUS | Status: AC
  Administered 2018-05-04: 1000 mL via INTRAVENOUS

## 2018-05-04 MED ORDER — GLYCOPYRROLATE 0.2 MG/ML IJ SOLN
0.2000 mg | Freq: Once | INTRAMUSCULAR | Status: AC
  Administered 2018-05-04: 0.2 mg via INTRAVENOUS

## 2018-05-04 MED ORDER — FENTANYL CITRATE (PF) 100 MCG/2ML IJ SOLN
INTRAMUSCULAR | Status: AC
  Filled 2018-05-04: qty 2

## 2018-05-04 MED ORDER — LIDOCAINE HCL 2 % IJ SOLN
20.0000 mL | Freq: Once | INTRAMUSCULAR | Status: AC
  Administered 2018-05-04: 400 mg
  Filled 2018-05-04: qty 40

## 2018-05-04 NOTE — Progress Notes (Signed)
Safety precautions to be maintained throughout the outpatient stay will include: orient to surroundings, keep bed in low position, maintain call bell within reach at all times, provide assistance with transfer out of bed and ambulation.  

## 2018-05-04 NOTE — Progress Notes (Signed)
Patient's Name: Amber Acosta  MRN: [id number]  Referring Provider: Barbette Merino, NP  DOB: [birth year]  PCP: Rayetta Humphrey, MD  DOS: 05/04/2018  Note by: Oswaldo Done, MD  Service setting: Ambulatory outpatient  Specialty: Interventional Pain Management  Patient type: Established  Location: [facility] (AMB) Pain Management Facility  Visit type: Interventional Procedure   Primary Reason for Visit: Interventional Pain Management Treatment. CC: Headache (left)  Procedure:       Anesthesia, Analgesia, Anxiolysis:  Type: Cervical Facet, Medial Branch Radiofrequency Ablation Primary Purpose: Therapeutic Region: Posterolateral cervical spine region Level: C3, C4, C5, C6, & C7 Medial Branch Level(s). Lesioning of these levels should completely denervate the C3-4, C4-5, C5-6, and the C6-7 cervical facet joints. Laterality: Left Paraspinal  Type: Moderate (Conscious) Sedation combined with Local Anesthesia Indication(s): Analgesia and Anxiety Route: Intravenous (IV) IV Access: Secured Sedation: Meaningful verbal contact was maintained at all times during the procedure  Local Anesthetic: Lidocaine 1-2%  Position: Prone with head of the table was raised to facilitate breathing.   Indications: 1. Spondylosis without myelopathy or radiculopathy, cervical region   2. Cervical facet syndrome (Left)   3. Chronic neck pain (Secondary source of pain) (Bilateral) (L>R)   4. Cervicalgia   5. Cervicogenic headache (Left)   6. Acute postoperative pain    Amber Acosta has been dealing with the above chronic pain for longer than three months and has either failed to respond, was unable to tolerate, or simply did not get enough benefit from other more conservative therapies including, but not limited to: 1. Over-the-counter medications 2. Anti-inflammatory medications 3. Muscle relaxants 4. Membrane stabilizers 5. Opioids 6. Physical therapy 7. Modalities (Heat, ice, etc.) 8. Invasive techniques such as  nerve blocks. Amber Acosta has attained more than 50% relief of the pain from a series of diagnostic injections conducted in separate occasions.  Pain Score: Pre-procedure: 0-No pain/10 Post-procedure: 0-No pain/10  Pre-op Assessment:  Amber Acosta is a 67 y.o. (year old), female patient, seen today for interventional treatment. She  has a past surgical history that includes neck fusion; Breast surgery; Back surgery; Knee arthroscopy; Fracture surgery; Hand surgery; and Abdominal hysterectomy. Amber Acosta has a current medication list which includes the following prescription(s): advair diskus, albuterol, amlodipine, b-d ins syr ultrafine 1cc/30g, b-d ins syr ultrafine 1cc/31g, famotidine, fluoxetine, furosemide, insulin syringe 1cc/31gx5/16", klor-con 10, losartan, lovastatin, metoprolol succinate, nitroglycerin, omeprazole, oxycodone, oxycodone, oxycodone, ranitidine, tizanidine, trazodone, hydralazine, hydrocodone-acetaminophen, insulin nph human, and insulin nph human, and the following Facility-Administered Medications: ephedrine, fentanyl, midazolam, and ondansetron. Her primarily concern today is the Headache (left)  Initial Vital Signs:  Pulse/HCG Rate: 68ECG Heart Rate: 66 Temp: 98 F (36.7 C) Resp: 18 BP: 125/70 SpO2: 95 %  BMI: Estimated body mass index is 39.33 kg/m as calculated from the following:   Height as of this encounter: 5\' 3"  (1.6 m).   Weight as of this encounter: 222 lb (100.7 kg).  Risk Assessment: Allergies: Reviewed. She is allergic to aspirin; ciprofloxacin; etanercept; metformin; methotrexate; mushroom extract complex; other; darvon [propoxyphene]; minocycline; and cobalamine combinations.  Allergy Precautions: None required Coagulopathies: Reviewed. None identified.  Blood-thinner therapy: None at this time Active Infection(s): Reviewed. None identified. Amber Acosta is afebrile  Site Confirmation: Amber Acosta was asked to confirm the procedure and laterality before  marking the site Procedure checklist: Completed Consent: Before the procedure and under the influence of no sedative(s), amnesic(s), or anxiolytics, the patient was informed of the treatment options, risks and possible complications.  To fulfill our ethical and legal obligations, as recommended by the American Medical Association's Code of Ethics, I have informed the patient of my clinical impression; the nature and purpose of the treatment or procedure; the risks, benefits, and possible complications of the intervention; the alternatives, including doing nothing; the risk(s) and benefit(s) of the alternative treatment(s) or procedure(s); and the risk(s) and benefit(s) of doing nothing. The patient was provided information about the general risks and possible complications associated with the procedure. These may include, but are not limited to: failure to achieve desired goals, infection, bleeding, organ or nerve damage, allergic reactions, paralysis, and death. In addition, the patient was informed of those risks and complications associated to Spine-related procedures, such as failure to decrease pain; infection (i.e.: Meningitis, epidural or intraspinal abscess); bleeding (i.e.: epidural hematoma, subarachnoid hemorrhage, or any other type of intraspinal or peri-dural bleeding); organ or nerve damage (i.e.: Any type of peripheral nerve, nerve root, or spinal cord injury) with subsequent damage to sensory, motor, and/or autonomic systems, resulting in permanent pain, numbness, and/or weakness of one or several areas of the body; allergic reactions; (i.e.: anaphylactic reaction); and/or death. Furthermore, the patient was informed of those risks and complications associated with the medications. These include, but are not limited to: allergic reactions (i.e.: anaphylactic or anaphylactoid reaction(s)); adrenal axis suppression; blood sugar elevation that in diabetics may result in ketoacidosis or comma; water  retention that in patients with history of congestive heart failure may result in shortness of breath, pulmonary edema, and decompensation with resultant heart failure; weight gain; swelling or edema; medication-induced neural toxicity; particulate matter embolism and blood vessel occlusion with resultant organ, and/or nervous system infarction; and/or aseptic necrosis of one or more joints. Finally, the patient was informed that Medicine is not an exact science; therefore, there is also the possibility of unforeseen or unpredictable risks and/or possible complications that may result in a catastrophic outcome. The patient indicated having understood very clearly. We have given the patient no guarantees and we have made no promises. Enough time was given to the patient to ask questions, all of which were answered to the patient's satisfaction. [name] has indicated that she wanted to continue with the procedure. Attestation: I, the ordering provider, attest that I have discussed with the patient the benefits, risks, side-effects, alternatives, likelihood of achieving goals, and potential problems during recovery for the procedure that I have provided informed consent. Date  Time: 05/04/2018  8:05 AM  Pre-Procedure Preparation:  Monitoring: As per clinic protocol. Respiration, ETCO2, SpO2, BP, heart rate and rhythm monitor placed and checked for adequate function Safety Precautions: Patient was assessed for positional comfort and pressure points before starting the procedure. Time-out: I initiated and conducted the "Time-out" before starting the procedure, as per protocol. The patient was asked to participate by confirming the accuracy of the "Time Out" information. Verification of the correct person, site, and procedure were performed and confirmed by me, the nursing staff, and the patient. "Time-out" conducted as per Joint Commission's Universal Protocol (UP.01.01.01). Time: 0938  Description of  Procedure:       Laterality: Left Level: C3, C4, C5, C6, & C7 Medial Branch Level(s). Area Prepped: Entire Posterior Cervico-thoracic Region Prepping solution: ChloraPrep (2% chlorhexidine gluconate and 70% isopropyl alcohol) Safety Precautions: Aspiration looking for blood return was conducted prior to all injections. At no point did we inject any substances, as a needle was being advanced. Before injecting, the patient was told to immediately notify me if she  was experiencing any new onset of "ringing in the ears, or metallic taste in the mouth". No attempts were made at seeking any paresthesias. Safe injection practices and needle disposal techniques used. Medications properly checked for expiration dates. SDV (single dose vial) medications used. After the completion of the procedure, all disposable equipment used was discarded in the proper designated medical waste containers. Local Anesthesia: Protocol guidelines were followed. The patient was positioned over the fluoroscopy table. The area was prepped in the usual manner. The time-out was completed. The target area was identified using fluoroscopy. A 12-in long, straight, sterile hemostat was used with fluoroscopic guidance to locate the targets for each level blocked. Once located, the skin was marked with an approved surgical skin marker. Once all sites were marked, the skin (epidermis, dermis, and hypodermis), as well as deeper tissues (fat, connective tissue and muscle) were infiltrated with a small amount of a short-acting local anesthetic, loaded on a 10cc syringe with a 25G, 1.5-in  Needle. An appropriate amount of time was allowed for local anesthetics to take effect before proceeding to the next step. Local Anesthetic: Lidocaine 2.0% The unused portion of the local anesthetic was discarded in the proper designated containers. Technical explanation of process:  Radiofrequency Ablation (RFA) C3 Medial Branch Nerve RFA: The target area for the  C3 dorsal medial articular branch is the lateral concave waist of the articular pillar of C3. Under fluoroscopic guidance, a Radiofrequency needle was inserted until contact was made with os over the postero-lateral aspect of the articular pillar of C3 (target area). Sensory and motor testing was conducted to properly adjust the position of the needle. Once satisfactory placement of the needle was achieved, the numbing solution was slowly injected after negative aspiration for blood. 2.0 mL of the nerve block solution was injected without difficulty or complication. After waiting for at least 3 minutes, the ablation was performed. Once completed, the needle was removed intact. C4 Medial Branch Nerve RFA: The target area for the C4 dorsal medial articular branch is the lateral concave waist of the articular pillar of C4. Under fluoroscopic guidance, a Radiofrequency needle was inserted until contact was made with os over the postero-lateral aspect of the articular pillar of C4 (target area). Sensory and motor testing was conducted to properly adjust the position of the needle. Once satisfactory placement of the needle was achieved, the numbing solution was slowly injected after negative aspiration for blood. 2.0 mL of the nerve block solution was injected without difficulty or complication. After waiting for at least 3 minutes, the ablation was performed. Once completed, the needle was removed intact. C5 Medial Branch Nerve RFA: The target area for the C5 dorsal medial articular branch is the lateral concave waist of the articular pillar of C5. Under fluoroscopic guidance, a Radiofrequency needle was inserted until contact was made with os over the postero-lateral aspect of the articular pillar of C5 (target area). Sensory and motor testing was conducted to properly adjust the position of the needle. Once satisfactory placement of the needle was achieved, the numbing solution was slowly injected after negative  aspiration for blood. 2.0 mL of the nerve block solution was injected without difficulty or complication. After waiting for at least 3 minutes, the ablation was performed. Once completed, the needle was removed intact. C6 Medial Branch Nerve RFA: The target area for the C6 dorsal medial articular branch is the lateral concave waist of the articular pillar of C6. Under fluoroscopic guidance, a Radiofrequency needle was inserted until  contact was made with os over the postero-lateral aspect of the articular pillar of C6 (target area). Sensory and motor testing was conducted to properly adjust the position of the needle. Once satisfactory placement of the needle was achieved, the numbing solution was slowly injected after negative aspiration for blood. 2.0 mL of the nerve block solution was injected without difficulty or complication. After waiting for at least 3 minutes, the ablation was performed. Once completed, the needle was removed intact. C7 Medial Branch Nerve RFA: The target for the C7 dorsal medial articular branch lies on the superior-medial tip of the C7 transverse process. Under fluoroscopic guidance, a Radiofrequency needle was inserted until contact was made with os over the postero-lateral aspect of the articular pillar of C7 (target area). Sensory and motor testing was conducted to properly adjust the position of the needle. Once satisfactory placement of the needle was achieved, the numbing solution was slowly injected after negative aspiration for blood. 2.0 mL of the nerve block solution was injected without difficulty or complication. After waiting for at least 3 minutes, the ablation was performed. Once completed, the needle was removed intact. Radiofrequency lesioning (ablation):  Radiofrequency Generator: NeuroTherm [id number] Sensory Stimulation Parameters: 50 Hz was used to locate & identify the nerve, making sure that the needle was positioned such that there was no sensory stimulation below  0.3 V or above 0.7 V. Motor Stimulation Parameters: 2 Hz was used to evaluate the motor component. Care was taken not to lesion any nerves that demonstrated motor stimulation of the lower extremities at an output of less than 2.5 times that of the sensory threshold, or a maximum of 2.0 V. Lesioning Technique Parameters: Standard Radiofrequency settings. (Not bipolar or pulsed.) Temperature Settings: 80 degrees C Lesioning time: 60 seconds Intra-operative Compliance: Compliant Materials & Medications: Needle(s) (Electrode/Cannula) Type: Teflon-coated, curved tip, Radiofrequency needle(s) Gauge: 22G Length: 10cm Numbing solution: 0.2% PF-Ropivacaine + Triamcinolone (40 mg/mL) diluted to a final concentration of 4 mg of Triamcinolone/mL of Ropivacaine The unused portion of the solution was discarded in the proper designated containers.  Once the entire procedure was completed, the treated area was cleaned, making sure to leave some of the prepping solution back to take advantage of its long term bactericidal properties.  Intra-operative Compliance: Compliant  Vitals:   05/04/18 1043 05/04/18 1050 05/04/18 1057 05/04/18 1100  BP: (!) 89/54 (!) 87/49  (!) 102/52  Pulse:  68  69  Resp:  20  16  Temp:      TempSrc:      SpO2:  95% 96% 96%  Weight:      Height:        Start Time: 0938 hrs. End Time: 1022 hrs.  Imaging Guidance (Spinal):  Type of Imaging Technique: Fluoroscopy Guidance (Spinal) Indication(s): Assistance in needle guidance and placement for procedures requiring needle placement in or near specific anatomical locations not easily accessible without such assistance. Exposure Time: Please see nurses notes. Contrast: None used. Fluoroscopic Guidance: I was personally present during the use of fluoroscopy. "Tunnel Vision Technique" used to obtain the best possible view of the target area. Parallax error corrected before commencing the procedure. "Direction-depth-direction"  technique used to introduce the needle under continuous pulsed fluoroscopy. Once target was reached, antero-posterior, oblique, and lateral fluoroscopic projection used confirm needle placement in all planes. Images permanently stored in EMR. Interpretation: No contrast injected. I personally interpreted the imaging intraoperatively. Adequate needle placement confirmed in multiple planes. Permanent images saved into the patient's record.  Antibiotic Prophylaxis:   Anti-infectives (From admission, onward)   None     Indication(s): None identified  Post-operative Assessment:  Post-procedure Vital Signs:  Pulse/HCG Rate: 6978 Temp: (!) 97.3 F (36.3 C) Resp: 16 BP: (!) 102/52(Dr. Naviera notified; verbal order to d/c) SpO2: 96 %  EBL: None  Complications: No immediate post-treatment complications observed by team, or reported by patient. However, the patient was noncompliant during the procedure, making it very technically difficult to accomplish.  Note: The patient tolerated the entire procedure well. A repeat set of vitals were taken after the procedure and the patient was kept under observation following institutional policy, for this type of procedure. Post-procedural neurological assessment was performed, showing return to baseline, prior to discharge. The patient was provided with post-procedure discharge instructions, including a section on how to identify potential problems. Should any problems arise concerning this procedure, the patient was given instructions to immediately contact us, at any time, without hesitation. In any case, we plan to contact the patient by telephone for a follow-up status report regarding this interventional procedure.  Comments:  No additional relevant information.  Plan of Care   Possible POC:  We will not attempt to do anymore radiofrequencies on this patient's symptoms she is a very poor candidate. Intraoperative assistance by the patient was  nonexistent. She was noncompliant with all of our requests to keep her movement to a minimum. I'm not very hopeful that she will get great benefit from this procedure since her constant movement of her neck is very likely to have moved are radiofrequency needles from the correct position, even after we put the analgesic local anesthetic. In addition, her main focus during the procedure, rather than pain attention and answering her questions correctly, was to constantly be asking for more medication, despite the fact that we explained to her in great detail why we could not over sedate her.    Imaging Orders     DG C-Arm 1-60 Min-No Report  Procedure Orders     Radiofrequency,Cervical  Medications ordered for procedure: Meds ordered this encounter  Medications  . lidocaine (XYLOCAINE) 2 % (with pres) injection 400 mg  . midazolam (VERSED) 5 MG/5ML injection 1-2 mg    Make sure Flumazenil is available in the pyxis when using this medication. If oversedation occurs, administer 0.2 mg IV over 15 sec. If after 45 sec no response, administer 0.2 mg again over 1 min; may repeat at 1 min intervals; not to exceed 4 doses (1 mg)  . fentaNYL (SUBLIMAZE) injection 25-50 mcg    Make sure Narcan is available in the pyxis when using this medication. In the event of respiratory depression (RR< 8/min): Titrate NARCAN (naloxone) in increments of 0.1 to 0.2 mg IV at 2-3 minute intervals, until desired degree of reversal.  . lactated ringers infusion 1,000 mL  . glycopyrrolate (ROBINUL) injection 0.2 mg  . ropivacaine (PF) 2 mg/mL (0.2%) (NAROPIN) injection 9 mL  . dexamethasone (DECADRON) injection 10 mg  . HYDROcodone-acetaminophen (NORCO/VICODIN) 5-325 MG tablet    Sig: Take 1 tablet by mouth every 8 (eight) hours as needed for up to 7 days for severe pain.    Dispense:  21 tablet    Refill:  0    For acute post-operative pain. Not to be refilled. To last 7 days.  . ondansetron (ZOFRAN) injection 4 mg  .  ePHEDrine injection 10 mg   Medications administered: We administered lidocaine, midazolam, fentaNYL, lactated ringers, glycopyrrolate, ropivacaine (PF) 2 mg/mL (0.2%),  and dexamethasone.  See the medical record for exact dosing, route, and time of administration.  New Prescriptions   HYDROCODONE-ACETAMINOPHEN (NORCO/VICODIN) 5-325 MG TABLET    Take 1 tablet by mouth every 8 (eight) hours as needed for up to 7 days for severe pain.   Disposition: Discharge home  Discharge Date & Time: 05/04/2018; 1104 hrs.   Physician-requested Follow-up: Return for contralateral RFA (2 wks): (R) C-FCT RFA.  Future Appointments  Date Time Provider Department Center  06/15/2018  8:30 AM Barbette Merino, NP [facility] None  06/29/2018 10:30 AM Delano Metz, MD [facility] None   Primary Care Physician: Rayetta Humphrey, MD Location: [facility] Note by: Oswaldo Done, MD Date: 05/04/2018; Time: 11:44 AM  Disclaimer:  Medicine is not an exact science. The only guarantee in medicine is that nothing is guaranteed. It is important to note that the decision to proceed with this intervention was based on the information collected from the patient. The Data and conclusions were drawn from the patient's questionnaire, the interview, and the physical examination. Because the information was provided in large part by the patient, it cannot be guaranteed that it has not been purposely or unconsciously manipulated. Every effort has been made to obtain as much relevant data as possible for this evaluation. It is important to note that the conclusions that lead to this procedure are derived in large part from the available data. Always take into account that the treatment will also be dependent on availability of resources and existing treatment guidelines, considered by other Pain Management Practitioners as being common knowledge and practice, at the time of the intervention. For  Medico-Legal purposes, it is also important to point out that variation in procedural techniques and pharmacological choices are the acceptable norm. The indications, contraindications, technique, and results of the above procedure should only be interpreted and judged by a Board-Certified Interventional Pain Specialist with extensive familiarity and expertise in the same exact procedure and technique.

## 2018-05-04 NOTE — Patient Instructions (Signed)

## 2018-05-05 ENCOUNTER — Telehealth: Payer: Self-pay | Admitting: *Deleted

## 2018-05-05 NOTE — Telephone Encounter (Signed)
Attempted to call patient, message left. 

## 2018-05-13 ENCOUNTER — Telehealth: Payer: Self-pay | Admitting: Pain Medicine

## 2018-05-13 NOTE — Telephone Encounter (Signed)
Please call about severe pain from RF .

## 2018-05-13 NOTE — Telephone Encounter (Signed)
Having pain in head after RF on 05-04-18. No fever. States pain has gotten some better today. Advised patient that increased pain for first few weeks following RF is expected. Offered appointment. States she does not wish to come for appointment, will see if it continues to improve.

## 2018-05-27 ENCOUNTER — Ambulatory Visit: Payer: Medicare Other | Admitting: Pain Medicine

## 2018-06-15 ENCOUNTER — Encounter: Payer: Medicare Other | Admitting: Nurse Practitioner

## 2018-06-17 ENCOUNTER — Other Ambulatory Visit: Payer: Self-pay

## 2018-06-17 ENCOUNTER — Encounter: Payer: Self-pay | Admitting: Nurse Practitioner

## 2018-06-17 ENCOUNTER — Ambulatory Visit: Payer: Medicare Other | Attending: Nurse Practitioner | Admitting: Nurse Practitioner

## 2018-06-17 VITALS — BP 100/61 | HR 69 | Temp 97.8°F | Resp 18 | Ht 63.5 in | Wt 222.0 lb

## 2018-06-17 DIAGNOSIS — E1122 Type 2 diabetes mellitus with diabetic chronic kidney disease: Secondary | ICD-10-CM | POA: Insufficient documentation

## 2018-06-17 DIAGNOSIS — M4312 Spondylolisthesis, cervical region: Secondary | ICD-10-CM | POA: Diagnosis not present

## 2018-06-17 DIAGNOSIS — K219 Gastro-esophageal reflux disease without esophagitis: Secondary | ICD-10-CM | POA: Insufficient documentation

## 2018-06-17 DIAGNOSIS — G894 Chronic pain syndrome: Secondary | ICD-10-CM | POA: Diagnosis not present

## 2018-06-17 DIAGNOSIS — E785 Hyperlipidemia, unspecified: Secondary | ICD-10-CM | POA: Diagnosis not present

## 2018-06-17 DIAGNOSIS — M25512 Pain in left shoulder: Secondary | ICD-10-CM | POA: Insufficient documentation

## 2018-06-17 DIAGNOSIS — Z881 Allergy status to other antibiotic agents status: Secondary | ICD-10-CM | POA: Insufficient documentation

## 2018-06-17 DIAGNOSIS — F329 Major depressive disorder, single episode, unspecified: Secondary | ICD-10-CM | POA: Insufficient documentation

## 2018-06-17 DIAGNOSIS — M25511 Pain in right shoulder: Secondary | ICD-10-CM | POA: Insufficient documentation

## 2018-06-17 DIAGNOSIS — Z981 Arthrodesis status: Secondary | ICD-10-CM | POA: Insufficient documentation

## 2018-06-17 DIAGNOSIS — Z6838 Body mass index (BMI) 38.0-38.9, adult: Secondary | ICD-10-CM | POA: Diagnosis not present

## 2018-06-17 DIAGNOSIS — M7918 Myalgia, other site: Secondary | ICD-10-CM | POA: Insufficient documentation

## 2018-06-17 DIAGNOSIS — Z8249 Family history of ischemic heart disease and other diseases of the circulatory system: Secondary | ICD-10-CM | POA: Insufficient documentation

## 2018-06-17 DIAGNOSIS — M545 Low back pain: Secondary | ICD-10-CM | POA: Insufficient documentation

## 2018-06-17 DIAGNOSIS — E559 Vitamin D deficiency, unspecified: Secondary | ICD-10-CM | POA: Diagnosis not present

## 2018-06-17 DIAGNOSIS — Z7951 Long term (current) use of inhaled steroids: Secondary | ICD-10-CM | POA: Diagnosis not present

## 2018-06-17 DIAGNOSIS — Z87891 Personal history of nicotine dependence: Secondary | ICD-10-CM | POA: Insufficient documentation

## 2018-06-17 DIAGNOSIS — L409 Psoriasis, unspecified: Secondary | ICD-10-CM | POA: Diagnosis not present

## 2018-06-17 DIAGNOSIS — M47819 Spondylosis without myelopathy or radiculopathy, site unspecified: Secondary | ICD-10-CM | POA: Diagnosis not present

## 2018-06-17 DIAGNOSIS — M47812 Spondylosis without myelopathy or radiculopathy, cervical region: Secondary | ICD-10-CM | POA: Diagnosis not present

## 2018-06-17 DIAGNOSIS — J449 Chronic obstructive pulmonary disease, unspecified: Secondary | ICD-10-CM | POA: Insufficient documentation

## 2018-06-17 DIAGNOSIS — N183 Chronic kidney disease, stage 3 (moderate): Secondary | ICD-10-CM | POA: Insufficient documentation

## 2018-06-17 DIAGNOSIS — Z79891 Long term (current) use of opiate analgesic: Secondary | ICD-10-CM | POA: Insufficient documentation

## 2018-06-17 DIAGNOSIS — M47816 Spondylosis without myelopathy or radiculopathy, lumbar region: Secondary | ICD-10-CM

## 2018-06-17 DIAGNOSIS — Z90710 Acquired absence of both cervix and uterus: Secondary | ICD-10-CM | POA: Insufficient documentation

## 2018-06-17 DIAGNOSIS — Z825 Family history of asthma and other chronic lower respiratory diseases: Secondary | ICD-10-CM | POA: Insufficient documentation

## 2018-06-17 DIAGNOSIS — M542 Cervicalgia: Secondary | ICD-10-CM | POA: Diagnosis present

## 2018-06-17 DIAGNOSIS — Z79899 Other long term (current) drug therapy: Secondary | ICD-10-CM | POA: Insufficient documentation

## 2018-06-17 DIAGNOSIS — I130 Hypertensive heart and chronic kidney disease with heart failure and stage 1 through stage 4 chronic kidney disease, or unspecified chronic kidney disease: Secondary | ICD-10-CM | POA: Insufficient documentation

## 2018-06-17 DIAGNOSIS — Z820 Family history of epilepsy and other diseases of the nervous system: Secondary | ICD-10-CM | POA: Insufficient documentation

## 2018-06-17 DIAGNOSIS — Z794 Long term (current) use of insulin: Secondary | ICD-10-CM | POA: Diagnosis not present

## 2018-06-17 DIAGNOSIS — N3946 Mixed incontinence: Secondary | ICD-10-CM | POA: Insufficient documentation

## 2018-06-17 DIAGNOSIS — D860 Sarcoidosis of lung: Secondary | ICD-10-CM | POA: Diagnosis not present

## 2018-06-17 DIAGNOSIS — Z5181 Encounter for therapeutic drug level monitoring: Secondary | ICD-10-CM | POA: Diagnosis not present

## 2018-06-17 DIAGNOSIS — Z886 Allergy status to analgesic agent status: Secondary | ICD-10-CM | POA: Insufficient documentation

## 2018-06-17 DIAGNOSIS — M064 Inflammatory polyarthropathy: Secondary | ICD-10-CM | POA: Insufficient documentation

## 2018-06-17 DIAGNOSIS — Z811 Family history of alcohol abuse and dependence: Secondary | ICD-10-CM | POA: Insufficient documentation

## 2018-06-17 DIAGNOSIS — Z9181 History of falling: Secondary | ICD-10-CM | POA: Diagnosis not present

## 2018-06-17 DIAGNOSIS — Z888 Allergy status to other drugs, medicaments and biological substances status: Secondary | ICD-10-CM | POA: Insufficient documentation

## 2018-06-17 DIAGNOSIS — I5032 Chronic diastolic (congestive) heart failure: Secondary | ICD-10-CM | POA: Insufficient documentation

## 2018-06-17 DIAGNOSIS — E1142 Type 2 diabetes mellitus with diabetic polyneuropathy: Secondary | ICD-10-CM | POA: Diagnosis not present

## 2018-06-17 DIAGNOSIS — Z833 Family history of diabetes mellitus: Secondary | ICD-10-CM | POA: Insufficient documentation

## 2018-06-17 MED ORDER — OXYCODONE HCL 5 MG PO TABS: 5.0000 mg | ORAL_TABLET | Freq: Three times a day (TID) | ORAL | 0 refills | Status: DC | PRN

## 2018-06-17 MED ORDER — TIZANIDINE HCL 4 MG PO TABS: 4.0000 mg | ORAL_TABLET | Freq: Two times a day (BID) | ORAL | 2 refills | Status: DC | PRN

## 2018-06-17 NOTE — Patient Instructions (Addendum)
____________________________________________________________________________________________  Medication Rules  Applies to: All patients receiving prescriptions (written or electronic).  Pharmacy of record: Pharmacy where electronic prescriptions will be sent. If written prescriptions are taken to a different pharmacy, please inform the nursing staff. The pharmacy listed in the electronic medical record should be the one where you would like electronic prescriptions to be sent.  Prescription refills: Only during scheduled appointments. Applies to both, written and electronic prescriptions.  NOTE: The following applies primarily to controlled substances (Opioid* Pain Medications).   Patient's responsibilities: 1. Pain Pills: Bring all pain pills to every appointment (except for procedure appointments). 2. Pill Bottles: Bring pills in original pharmacy bottle. Always bring newest bottle. Bring bottle, even if empty. 3. Medication refills: You are responsible for knowing and keeping track of what medications you need refilled. The day before your appointment, write a list of all prescriptions that need to be refilled. Bring that list to your appointment and give it to the admitting nurse. Prescriptions will be written only during appointments. If you forget a medication, it will not be "Called in", "Faxed", or "electronically sent". You will need to get another appointment to get these prescribed. 4. Prescription Accuracy: You are responsible for carefully inspecting your prescriptions before leaving our office. Have the discharge nurse carefully go over each prescription with you, before taking them home. Make sure that your name is accurately spelled, that your address is correct. Check the name and dose of your medication to make sure it is accurate. Check the number of pills, and the written instructions to make sure they are clear and accurate. Make sure that you are given enough medication to last  until your next medication refill appointment. 5. Taking Medication: Take medication as prescribed. Never take more pills than instructed. Never take medication more frequently than prescribed. Taking less pills or less frequently is permitted and encouraged, when it comes to controlled substances (written prescriptions).  6. Inform other Doctors: Always inform, all of your healthcare providers, of all the medications you take. 7. Pain Medication from other Providers: You are not allowed to accept any additional pain medication from any other Doctor or Healthcare provider. There are two exceptions to this rule. (see below) In the event that you require additional pain medication, you are responsible for notifying us, as stated below. 8. Medication Agreement: You are responsible for carefully reading and following our Medication Agreement. This must be signed before receiving any prescriptions from our practice. Safely store a copy of your signed Agreement. Violations to the Agreement will result in no further prescriptions. (Additional copies of our Medication Agreement are available upon request.) 9. Laws, Rules, & Regulations: All patients are expected to follow all Federal and State Laws, Statutes, Rules, & Regulations. Ignorance of the Laws does not constitute a valid excuse. The use of any illegal substances is prohibited. 10. Adopted CDC guidelines & recommendations: Target dosing levels will be at or below 60 MME/day. Use of benzodiazepines** is not recommended.  Exceptions: There are only two exceptions to the rule of not receiving pain medications from other Healthcare Providers. 1. Exception #1 (Emergencies): In the event of an emergency (i.e.: accident requiring emergency care), you are allowed to receive additional pain medication. However, you are responsible for: As soon as you are able, call our office [phone], at any time of the day or night, and leave a message stating your name, the  date and nature of the emergency, and the name and dose of the medication   prescribed. In the event that your call is answered by a member of our staff, make sure to document and save the date, time, and the name of the person that took your information.  2. Exception #2 (Planned Surgery): In the event that you are scheduled by another doctor or dentist to have any type of surgery or procedure, you are allowed (for a period no longer than 30 days), to receive additional pain medication, for the acute post-op pain. However, in this case, you are responsible for picking up a copy of our "Post-op Pain Management for Surgeons" handout, and giving it to your surgeon or dentist. This document is available at our office, and does not require an appointment to obtain it. Simply go to our office during business hours (Monday-Thursday from 8:00 AM to 4:00 PM) (Friday 8:00 AM to 12:00 Noon) or if you have a scheduled appointment with us, prior to your surgery, and ask for it by name. In addition, you will need to provide us with your name, name of your surgeon, type of surgery, and date of procedure or surgery.  *Opioid medications include: morphine, codeine, oxycodone, oxymorphone, hydrocodone, hydromorphone, meperidine, tramadol, tapentadol, buprenorphine, fentanyl, methadone. **Benzodiazepine medications include: diazepam (Valium), alprazolam (Xanax), clonazepam (Klonopine), lorazepam (Ativan), clorazepate (Tranxene), chlordiazepoxide (Librium), estazolam (Prosom), oxazepam (Serax), temazepam (Restoril), triazolam (Halcion) (Last updated: 01/28/2018) ____________________________________________________________________________________________    BMI Assessment: Estimated body mass index is 38.71 kg/m as calculated from the following:   Height as of this encounter: 5' 3.5" (1.613 m).   Weight as of this encounter: 222 lb (100.7 kg).  BMI interpretation table: BMI level Category Range association with higher  incidence of chronic pain  <18 kg/m2 Underweight   18.5-24.9 kg/m2 Ideal body weight   25-29.9 kg/m2 Overweight Increased incidence by 20%  30-34.9 kg/m2 Obese (Class I) Increased incidence by 68%  35-39.9 kg/m2 Severe obesity (Class II) Increased incidence by 136%  >40 kg/m2 Extreme obesity (Class III) Increased incidence by 254%   Patient's current BMI Ideal Body weight  Body mass index is 38.71 kg/m. Ideal body weight: 53.5 kg (118 lb 0.9 oz) Adjusted ideal body weight: 72.4 kg (159 lb 10.1 oz)   BMI Readings from Last 4 Encounters:  06/17/18 38.71 kg/m  05/04/18 39.33 kg/m  03/16/18 39.33 kg/m  12/03/17 39.50 kg/m   Wt Readings from Last 4 Encounters:  06/17/18 222 lb (100.7 kg)  05/04/18 222 lb (100.7 kg)  03/16/18 222 lb (100.7 kg)  12/03/17 223 lb (101.2 kg)

## 2018-06-17 NOTE — Progress Notes (Signed)
Nursing Pain Medication Assessment:  Safety precautions to be maintained throughout the outpatient stay will include: orient to surroundings, keep bed in low position, maintain call bell within reach at all times, provide assistance with transfer out of bed and ambulation.  Medication Inspection Compliance: Pill count conducted under aseptic conditions, in front of the patient. Neither the pills nor the bottle was removed from the patient's sight at any time. Once count was completed pills were immediately returned to the patient in their original bottle.  Medication: Oxycodone IR Pill/Patch Count: 85 of 90 pills remain Pill/Patch Appearance: Markings consistent with prescribed medication Bottle Appearance: Standard pharmacy container. Clearly labeled. Filled Date: 07 / 16 / 2019 Last Medication intake:  Today

## 2018-06-17 NOTE — Progress Notes (Signed)
Patient's Name: Amber Acosta  MRN: [id number]  Referring Provider: Sharyne Peach, MD  DOB: May 29, 1951  PCP: Sharyne Peach, MD  DOS: 06/17/2018  Note by: Vevelyn Francois NP  Service setting: Ambulatory outpatient  Specialty: Interventional Pain Management  Location: [facility] (AMB) Pain Management Facility    Patient type: Established    Primary Reason(s) for Visit: Encounter for prescription drug management. (Level of risk: moderate)  CC: Neck Pain  HPI  Amber Acosta is a 67 y.o. year old, female patient, who comes today for a medication management evaluation. She has Cervical vertebral fusion (Uncomplicated C5 through C7 ACDF.); Fibromyalgia; Lumbar facet joint pain; Lumbar facet syndrome (Bilateral) (R>L); Stage 3 chronic kidney disease ([place]); COPD (chronic obstructive pulmonary disease) ([place]); Depression; Type 2 diabetes mellitus ([place]); Hypertensive heart disease with congestive heart failure ([place]); Gall bladder polyp; GERD (gastroesophageal reflux disease); Hyperlipemia; Inflammatory polyarthropathy ([place]); Sleep terror; Obesity, Class II, BMI 35-39.9, with comorbidity; OP (osteoporosis); Psoriasis; Kidney cysts; Muscle weakness (generalized); Sarcoidosis; Thrombocytopenia ([place]); Vitamin D deficiency; Chronic neck pain (Secondary source of pain) (Bilateral) (L>R); Cervicogenic headache (Left); Cervical facet syndrome (Left); At risk for falls; Balance problems; History of closed head injury; Opiate use (22.5 MME/Day); Long term current use of opiate analgesic; Encounter for therapeutic drug level monitoring; Long term prescription opiate use; Spondylolisthesis of cervical region; Cervical spine ankylosis; Hx of cervical spine surgery; Low magnesium levels; Myofascial pain; Neuropathy; Benign paroxysmal positional vertigo; Diabetic peripheral neuropathy ([place]); Meralgia paresthetica (Right); Cervical spondylosis; Chronic diastolic congestive heart failure ([place]); Mixed stress and urge urinary incontinence;  Overactive detrusor; 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[place]); Chronic low back pain (Primary Source of Pain) (Bilateral) (R>L); Chronic pain syndrome; Sarcoidosis of lung ([place]); Chronic back pain; Chronic Occipital neuralgia (Left); Epigastric pain; Displacement of cervical intervertebral disc without myelopathy; Essential hypertension; Hypertension; Morbid obesity with BMI of 40.0-44.9, adult ([place]); Recurrent major depressive disorder, in partial remission ([place]); Right arm weakness; Acute postoperative pain; Disturbance of skin sensation; Cervical spondylitis with radiculitis ([place]); Chronic shoulder pain [facility] source of pain) (Bilateral) (L>R); Spondylosis without myelopathy or radiculopathy, cervical region; Cervicalgia; Vasovagal episode; Nausea; Hypotension; and Bradycardia on their problem list. Her primarily concern today is the Neck Pain  Pain Assessment: Location: Upper Neck Radiating: head Onset: More than a month ago Duration: Chronic pain Quality: Stabbing, Sharp, Sore, Tender Severity: 7 /10 (subjective, self-reported pain score)  Note: Reported level is compatible with observation. Clinically the patient looks like a 3/10 A 3/10 is viewed as "Moderate" and described as significantly interfering with activities of daily living (ADL). It becomes difficult to feed, bathe, get dressed, get on and off the toilet or to perform personal hygiene functions. Difficult to get in and out of bed or a chair without assistance. Very distracting. With effort, it can be ignored when deeply involved in activities. Information on the proper use of the pain scale provided to the patient today. When using our objective Pain Scale, levels between 6 and 10/10 are said to belong in an emergency room, as it progressively worsens from a 6/10, described as severely limiting, requiring emergency care not usually available at an outpatient pain management facility. At a 6/10 level, communication becomes difficult and  requires great effort. Assistance to reach the emergency department may be required. Facial flushing and profuse sweating along with potentially dangerous increases in heart rate and blood pressure will be evident. Timing: Intermittent Modifying factors: heat, not turning head, medications, RFA BP: 100/61  HR: 69  [name] was last scheduled for an appointment on 03/16/2018 for medication management. During today's appointment we reviewed Amber Acosta's chronic pain status, as well as her outpatient medication regimen. She admits that her pain is today. She admits that she has had to do more household chores secondary to her cleaning lady being off.  She is concern about having the right cervical RFA. She has had great relief with left cervical RFA. She states that she has not had any right sided headaches.   The patient  reports that she does not use drugs. Her body mass index is 38.71 kg/m.  Further details on both, my assessment(s), as well as the proposed treatment plan, please see below.  Controlled Substance Pharmacotherapy Assessment REMS (Risk Evaluation and Mitigation Strategy)  Analgesic:Oxycodone IR 5 mg every 8 hours (15 mg/day) MME/day:22.5 mg/day    Hart Rochester, RN  06/17/2018  9:34 AM  Sign at close encounter Nursing Pain Medication Assessment:  Safety precautions to be maintained throughout the outpatient stay will include: orient to surroundings, keep bed in low position, maintain call bell within reach at all times, provide assistance with transfer out of bed and ambulation.  Medication Inspection Compliance: Pill count conducted under aseptic conditions, in front of the patient. Neither the pills nor the bottle was removed from the patient's sight at any time. Once count was completed pills were immediately returned to the patient in their original bottle.  Medication: Oxycodone IR Pill/Patch Count: 85 of 90 pills remain Pill/Patch Appearance: Markings  consistent with prescribed medication Bottle Appearance: Standard pharmacy container. Clearly labeled. Filled Date: 07 / 16 / 2019 Last Medication intake:  Today   Pharmacokinetics: Liberation and absorption (onset of action): WNL Distribution (time to peak effect): WNL Metabolism and excretion (duration of action): WNL         Pharmacodynamics: Desired effects: Analgesia: Amber Acosta reports >50% benefit. Functional ability: Patient reports that medication allows her to accomplish basic ADLs Clinically meaningful improvement in function (CMIF): Sustained CMIF goals met Perceived effectiveness: Described as relatively effective, allowing for increase in activities of daily living (ADL) Undesirable effects: Side-effects or Adverse reactions: None reported Monitoring: [place] PMP: Online review of the past 96-monthperiod conducted. Compliant with practice rules and regulations Last UDS on record: Summary  Date Value Ref Range Status  03/16/2018 FINAL  Final    Comment:    ==================================================================== TOXASSURE SELECT 13 (MW) ==================================================================== Test                             Result       Flag       Units Drug Absent but Declared for Prescription Verification   Oxycodone                      Not Detected UNEXPECTED ng/mg creat ==================================================================== Test                      Result    Flag   Units      Ref Range   Creatinine              106              mg/dL      >=20 ==================================================================== Declared Medications:  The flagging and interpretation on this report are based on the  following declared medications.  Unexpected results may arise from  inaccuracies in the declared  medications.  **Note: The testing scope of this panel includes these medications:  Oxycodone  **Note: The testing scope of this panel  does not include following  reported medications:  Albuterol  Amlodipine  Famotidine  Fluoxetine  Fluticasone (Advair)  Furosemide  Hydralazine  Insulin  Losartan (Losartan Potassium)  Lovastatin  Metoprolol  Nitroglycerin  Potassium  Salmeterol (Advair)  Tizanidine  Trazodone ==================================================================== For clinical consultation, please call [phone]. ====================================================================    UDS interpretation: Compliant          Medication Assessment Form: Reviewed. Patient indicates being compliant with therapy Treatment compliance: Compliant Risk Assessment Profile: Aberrant behavior: See prior evaluations. None observed or detected today Comorbid factors increasing risk of overdose: See prior notes. No additional risks detected today Risk of substance use disorder (SUD): Low  ORT Scoring interpretation table:  Score <3 = Low Risk for SUD  Score between 4-7 = Moderate Risk for SUD  Score >8 = High Risk for Opioid Abuse   Risk Mitigation Strategies:  Patient Counseling: Covered Patient-Prescriber Agreement (PPA): Present and active  Notification to other healthcare providers: Done  Pharmacologic Plan: No change in therapy, at this time.            Post-Procedure Assessment  05/04/2018 Procedure: Cervical facet RFA.  Pre-procedure pain score:  0/10 Post-procedure pain score: 0/10         Influential Factors: BMI: 38.71 kg/m Intra-procedural challenges: None observed.         Assessment challenges: None detected.              Reported side-effects: None.        Post-procedural adverse reactions or complications: None reported         Sedation: Please see nurses note. When no sedatives are used, the analgesic levels obtained are directly associated to the effectiveness of the local anesthetics. However, when sedation is provided, the level of analgesia obtained during the initial 1 hour  following the intervention, is believed to be the result of a combination of factors. These factors may include, but are not limited to: 1. The effectiveness of the local anesthetics used. 2. The effects of the analgesic(s) and/or anxiolytic(s) used. 3. The degree of discomfort experienced by the patient at the time of the procedure. 4. The patients ability and reliability in recalling and recording the events. 5. The presence and influence of possible secondary gains and/or psychosocial factors. Reported result: Relief experienced during the 1st hour after the procedure: 100 % (Ultra-Short Term Relief)            Interpretative annotation: Clinically appropriate result. Analgesia during this period is likely to be Local Anesthetic and/or IV Sedative (Analgesic/Anxiolytic) related.          Effects of local anesthetic: The analgesic effects attained during this period are directly associated to the localized infiltration of local anesthetics and therefore cary significant diagnostic value as to the etiological location, or anatomical origin, of the pain. Expected duration of relief is directly dependent on the pharmacodynamics of the local anesthetic used. Long-acting (4-6 hours) anesthetics used.  Reported result: Relief during the next 4 to 6 hour after the procedure: 100 % (Short-Term Relief)            Interpretative annotation: Clinically appropriate result. Analgesia during this period is likely to be Local Anesthetic-related.          Long-term benefit: Defined as the period of time past the expected duration of local anesthetics (1 hour for  short-acting and 4-6 hours for long-acting). With the possible exception of prolonged sympathetic blockade from the local anesthetics, benefits during this period are typically attributed to, or associated with, other factors such as analgesic sensory neuropraxia, antiinflammatory effects, or beneficial biochemical changes provided by agents other than the  local anesthetics.  Reported result: Extended relief following procedure: 90 % (Long-Term Relief)            Interpretative annotation: Clinically appropriate result. Good relief. No permanent benefit expected. Inflammation plays a part in the etiology to the pain.          Current benefits: Defined as reported results that persistent at this point in time.   Analgesia:  85 %            Function: Amber Acosta reports improvement in function ROM: Amber Acosta reports improvement in ROM Interpretative annotation: Ongoing benefit.                Interpretation: Results would suggest adequate radiofrequency ablation.                  Plan:  Please see "Plan of Care" for details.                Laboratory Chemistry  Inflammation Markers (CRP: Acute Phase) (ESR: Chronic Phase) Lab Results  Component Value Date   CRP 2.1 (H) 03/26/2016   ESRSEDRATE 52 (H) 03/26/2016                         Rheumatology Markers No results found for: RF, ANA, LABURIC, URICUR, LYMEIGGIGMAB, LYMEABIGMQN, HLAB27                      Renal Function Markers Lab Results  Component Value Date   BUN 14 03/26/2016   CREATININE 1.28 (H) 03/26/2016   GFRAA 50 (L) 03/26/2016   GFRNONAA 44 (L) 03/26/2016                             Hepatic Function Markers Lab Results  Component Value Date   AST 24 03/26/2016   ALT 20 03/26/2016   ALBUMIN 4.0 03/26/2016   ALKPHOS 70 03/26/2016                        Electrolytes Lab Results  Component Value Date   NA 136 03/26/2016   K 3.8 03/26/2016   CL 100 (L) 03/26/2016   CALCIUM 8.5 (L) 03/26/2016   MG 2.1 06/22/2017                        Neuropathy Markers Lab Results  Component Value Date   VITAMINB12 628 06/22/2017                        Bone Pathology Markers Lab Results  Component Value Date   VD25OH 20.2 (L) 03/26/2016                         Coagulation Parameters Lab Results  Component Value Date   PLT 146 (L) 03/26/2016                         Cardiovascular Markers No results found for: BNP, CKTOTAL, CKMB, TROPONINI, HGB, HCT  CA Markers No results found for: CEA, CA125, LABCA2                      Note: Lab results reviewed.  Recent Diagnostic Imaging Results  DG C-Arm 1-60 Min-No Report Fluoroscopy was utilized by the requesting physician.  No radiographic  interpretation.   Complexity Note: Imaging results reviewed. Results shared with Amber Acosta, using Layman's terms.                         Meds   Current Outpatient Medications:  .  ADVAIR DISKUS 250-50 MCG/DOSE AEPB, , Disp: , Rfl:  .  albuterol (PROVENTIL HFA;VENTOLIN HFA) 108 (90 Base) MCG/ACT inhaler, Inhale 2 puffs into the lungs every 6 (six) hours as needed. , Disp: , Rfl:  .  amLODipine (NORVASC) 10 MG tablet, Take 10 mg by mouth daily. , Disp: , Rfl:  .  B-D INS SYR ULTRAFINE 1CC/30G 30G X 1/2" 1 ML MISC, , Disp: , Rfl:  .  B-D INS SYR ULTRAFINE 1CC/31G 31G X 5/16" 1 ML MISC, , Disp: , Rfl:  .  famotidine (PEPCID) 20 MG tablet, , Disp: , Rfl:  .  FLUoxetine (PROZAC) 40 MG capsule, Take 40 mg by mouth daily. , Disp: , Rfl:  .  furosemide (LASIX) 40 MG tablet, Take 40 mg by mouth daily., Disp: , Rfl:  .  hydrALAZINE (APRESOLINE) 25 MG tablet, Take 25 mg by mouth 2 (two) times daily. , Disp: , Rfl:  .  insulin NPH Human (HUMULIN N,NOVOLIN N) 100 UNIT/ML injection, NPH inject SQ bid 80 in the am and 40 units at night, Disp: , Rfl:  .  insulin NPH Human (HUMULIN N,NOVOLIN N) 100 UNIT/ML injection, NPH inject 80 in the am prior to breakfast, and 40 units SQ  Units prior dinner., Disp: , Rfl:  .  Insulin Syringe-Needle U-100 (INSULIN SYRINGE 1CC/31GX5/16") 31G X 5/16" 1 ML MISC, Use once daily as directed by physician., Disp: , Rfl:  .  KLOR-CON 10 10 MEQ tablet, Take 10 mEq by mouth daily. , Disp: , Rfl:  .  losartan (COZAAR) 100 MG tablet, TAKE 1 TABLET (100 MG TOTAL) BY MOUTH ONCE DAILY., Disp: , Rfl:  .  lovastatin (MEVACOR) 10 MG  tablet, Take 10 mg by mouth at bedtime., Disp: , Rfl:  .  metoprolol succinate (TOPROL-XL) 50 MG 24 hr tablet, Take 25 mg by mouth daily. , Disp: , Rfl:  .  nitroGLYCERIN (NITROSTAT) 0.3 MG SL tablet, 1 tablet sublingual prn for ESOPHAGEAL SPASMS(may repeat every 5 min, seek med help if pain persists after 3 tablets), Disp: , Rfl:  .  omeprazole (PRILOSEC) 40 MG capsule, Take 40 mg by mouth daily., Disp: , Rfl:  .  [START ON 08/14/2018] oxyCODONE (OXY IR/ROXICODONE) 5 MG immediate release tablet, Take 1 tablet (5 mg total) by mouth every 8 (eight) hours as needed for moderate pain or severe pain., Disp: 90 tablet, Rfl: 0 .  ranitidine (ZANTAC) 150 MG tablet, Take 150 mg by mouth 2 (two) times daily., Disp: , Rfl:  .  [START ON 07/15/2018] tiZANidine (ZANAFLEX) 4 MG tablet, Take 1 tablet (4 mg total) by mouth 2 (two) times daily as needed for muscle spasms., Disp: 60 tablet, Rfl: 2 .  traZODone (DESYREL) 100 MG tablet, Take 100 mg by mouth at bedtime. , Disp: , Rfl:  .  [START ON 09/13/2018] oxyCODONE (OXY IR/ROXICODONE) 5 MG immediate release tablet,  Take 1 tablet (5 mg total) by mouth every 8 (eight) hours as needed for moderate pain or severe pain., Disp: 90 tablet, Rfl: 0 .  [START ON 07/15/2018] oxyCODONE (OXY IR/ROXICODONE) 5 MG immediate release tablet, Take 1 tablet (5 mg total) by mouth every 8 (eight) hours as needed for moderate pain or severe pain., Disp: 90 tablet, Rfl: 0  ROS  Constitutional: Denies any fever or chills Gastrointestinal: No reported hemesis, hematochezia, vomiting, or acute GI distress Musculoskeletal: Denies any acute onset joint swelling, redness, loss of ROM, or weakness Neurological: No reported episodes of acute onset apraxia, aphasia, dysarthria, agnosia, amnesia, paralysis, loss of coordination, or loss of consciousness  Allergies  Amber Acosta is allergic to aspirin; ciprofloxacin; etanercept; metformin; methotrexate; mushroom extract complex; other; darvon  [propoxyphene]; minocycline; and cobalamine combinations.  PFSH  Drug: Amber Acosta  reports that she does not use drugs. Alcohol:  reports that she does not drink alcohol. Tobacco:  reports that she has quit smoking. She has quit using smokeless tobacco. Medical:  has a past medical history of Arthritis, Cervical spondylosis without myelopathy (09/21/2015), Chronic pain, Chronic pain associated with significant psychosocial dysfunction (08/28/2013), Depression, Diabetes mellitus without complication ([place]), Displacement of cervical intervertebral disc without myelopathy (09/05/2015), Fibromyalgia, GERD (gastroesophageal reflux disease), Hypertension, Kidney disease, chronic, stage III (GFR 30-59 ml/min) ([place]), Sarcoid, and Thrombopenia ([place]). Surgical: Amber Acosta  has a past surgical history that includes neck fusion; Breast surgery; Back surgery; Knee arthroscopy; Fracture surgery; Hand surgery; and Abdominal hysterectomy. Family: family history includes Alcohol abuse in her brother; Alzheimer's disease in her mother; COPD in her father; Diabetes in her father; Heart disease in her father.  Constitutional Exam  General appearance: Well nourished, well developed, and well hydrated. In no apparent acute distress Vitals:   06/17/18 0921  BP: 100/61  Pulse: 69  Resp: 18  Temp: 97.8 F (36.6 C)  TempSrc: Oral  SpO2: 95%  Weight: 222 lb (100.7 kg)  Height: 5' 3.5" (1.613 m)  Psych/Mental status: Alert, oriented x 3 (person, place, & time)       Eyes: PERLA Respiratory: No evidence of acute respiratory distress  Cervical Spine Area Exam  Skin & Axial Inspection: Well healed scar from previous spine surgery detected Alignment: Symmetrical Functional ROM: Unrestricted ROM      Stability: No instability detected Muscle Tone/Strength: Functionally intact. No obvious neuro-muscular anomalies detected. Sensory (Neurological): Unimpaired Palpation: No palpable anomalies              Upper Extremity  (UE) Exam    Side: Right upper extremity  Side: Left upper extremity  Skin & Extremity Inspection: Skin color, temperature, and hair growth are WNL. No peripheral edema or cyanosis. No masses, redness, swelling, asymmetry, or associated skin lesions. No contractures.  Skin & Extremity Inspection: Skin color, temperature, and hair growth are WNL. No peripheral edema or cyanosis. No masses, redness, swelling, asymmetry, or associated skin lesions. No contractures.  Functional ROM: Unrestricted ROM          Functional ROM: Unrestricted ROM          Muscle Tone/Strength: Functionally intact. No obvious neuro-muscular anomalies detected.  Muscle Tone/Strength: Functionally intact. No obvious neuro-muscular anomalies detected.  Sensory (Neurological): Unimpaired          Sensory (Neurological): Unimpaired          Palpation: No palpable anomalies              Palpation: No palpable anomalies  Provocative Test(s):  Phalen's test: deferred Tinel's test: deferred Apley's scratch test (touch opposite shoulder):  Action 1 (Across chest): deferred Action 2 (Overhead): deferred Action 3 (LB reach): deferred   Provocative Test(s):  Phalen's test: deferred Tinel's test: deferred Apley's scratch test (touch opposite shoulder):  Action 1 (Across chest): deferred Action 2 (Overhead): deferred Action 3 (LB reach): deferred    Thoracic Spine Area Exam  Skin & Axial Inspection: No masses, redness, or swelling Alignment: Symmetrical Functional ROM: Unrestricted ROM Stability: No instability detected Muscle Tone/Strength: Functionally intact. No obvious neuro-muscular anomalies detected. Sensory (Neurological): Unimpaired Muscle strength & Tone: No palpable anomalies  Gait & Posture Assessment  Ambulation: Unassisted Gait: Relatively normal for age and body habitus Posture: WNL   Lower Extremity Exam    Side: Right lower extremity  Side: Left lower extremity  Stability: No instability  observed          Stability: No instability observed          Skin & Extremity Inspection: Skin color, temperature, and hair growth are WNL. No peripheral edema or cyanosis. No masses, redness, swelling, asymmetry, or associated skin lesions. No contractures.  Skin & Extremity Inspection: Skin color, temperature, and hair growth are WNL. No peripheral edema or cyanosis. No masses, redness, swelling, asymmetry, or associated skin lesions. No contractures.  Functional ROM: Unrestricted ROM                  Functional ROM: Unrestricted ROM                  Muscle Tone/Strength: Functionally intact. No obvious neuro-muscular anomalies detected.  Muscle Tone/Strength: Functionally intact. No obvious neuro-muscular anomalies detected.  Sensory (Neurological): Unimpaired  Sensory (Neurological): Unimpaired  Palpation: No palpable anomalies  Palpation: No palpable anomalies   Assessment  Primary Diagnosis & Pertinent Problem List: The primary encounter diagnosis was Spondylosis without myelopathy or radiculopathy, cervical region. Diagnoses of Cervical spondylosis, Lumbar facet syndrome (Bilateral) (R>L), Chronic pain syndrome, Myofascial pain, and Long term prescription opiate use were also pertinent to this visit.  Status Diagnosis  Controlled Controlled Controlled 1. Spondylosis without myelopathy or radiculopathy, cervical region   2. Cervical spondylosis   3. Lumbar facet syndrome (Bilateral) (R>L)   4. Chronic pain syndrome   5. Myofascial pain   6. Long term prescription opiate use     Problems updated and reviewed during this visit: Problem  Chronic Back Pain  Hyperlipemia   Last Assessment & Plan:  Well controlled. Patient is not fasting today but will nedd repeat at future visit. Last Assessment & Plan:  Well controlled. Patient is not fasting today but will nedd repeat at future visit.  Last Assessment & Plan:  Well controlled. Patient is not fasting today but will nedd repeat at  future visit.  Last Assessment & Plan:  Well controlled. Patient is not fasting today but will nedd repeat at future visit.  Last Assessment & Plan:  Well controlled. Patient is not fasting today but will nedd repeat at future visit.    Plan of Care  Pharmacotherapy (Medications Ordered): Meds ordered this encounter  Medications  . oxyCODONE (OXY IR/ROXICODONE) 5 MG immediate release tablet    Sig: Take 1 tablet (5 mg total) by mouth every 8 (eight) hours as needed for moderate pain or severe pain.    Dispense:  90 tablet    Refill:  0    Do not place this medication, or any other prescription from our practice,  on "Automatic Refill". Patient may have prescription filled one day early if pharmacy is closed on scheduled refill date. Do not fill until:09/13/2018 To last until:10/13/2018    Order Specific Question:   Supervising Provider    Answer:   Milinda Pointer [phone]  . oxyCODONE (OXY IR/ROXICODONE) 5 MG immediate release tablet    Sig: Take 1 tablet (5 mg total) by mouth every 8 (eight) hours as needed for moderate pain or severe pain.    Dispense:  90 tablet    Refill:  0    Do not place this medication, or any other prescription from our practice, on "Automatic Refill". Patient may have prescription filled one day early if pharmacy is closed on scheduled refill date. Do not fill until:08/14/2018 To last until:09/13/2018    Order Specific Question:   Supervising Provider    Answer:   Milinda Pointer [phone]  . oxyCODONE (OXY IR/ROXICODONE) 5 MG immediate release tablet    Sig: Take 1 tablet (5 mg total) by mouth every 8 (eight) hours as needed for moderate pain or severe pain.    Dispense:  90 tablet    Refill:  0    Do not place this medication, or any other prescription from our practice, on "Automatic Refill". Patient may have prescription filled one day early if pharmacy is closed on scheduled refill date. Do not fill until:07/15/2018 To last until: 08/14/2018     Order Specific Question:   Supervising Provider    Answer:   Milinda Pointer [phone]  . tiZANidine (ZANAFLEX) 4 MG tablet    Sig: Take 1 tablet (4 mg total) by mouth 2 (two) times daily as needed for muscle spasms.    Dispense:  60 tablet    Refill:  2    Do not place this medication, or any other prescription from our practice, on "Automatic Refill". Patient may have prescription filled one day early if pharmacy is closed on scheduled refill date.    Order Specific Question:   Supervising Provider    Answer:   Milinda Pointer [phone]   New Prescriptions   No medications on file   Medications administered today: Amber Acosta had no medications administered during this visit. Lab-work, procedure(s), and/or referral(s): Orders Placed This Encounter  Procedures  . ToxASSURE Select 13 (MW), Urine   Imaging and/or referral(s): None  Interventional management options: Planned, scheduled, and/or pending:  Not at this time.    Considering:  Palliativeleft sided cervical facet block Diagnostic/palliative bilateral lumbar facet block Possible bilateral lumbar facet radiofrequency ablation Diagnostic/palliative left cervical facet block Possible left-sided cervical facet radiofrequency ablation   Palliative PRN treatment(s):  Diagnostic/palliative bilateral lumbar facet block Diagnostic/palliativeleft cervical facet block Palliativeleft sided cervical facet block      Provider-requested follow-up: Return in about 3 months (around 09/17/2018) for MedMgmt with Me Donella Stade [facility]).  Future Appointments  Date Time Provider [place]  06/29/2018 10:30 AM Milinda Pointer, MD [facility] None  09/14/2018  9:15 AM Vevelyn Francois, NP [facility] None   Primary Care Physician: Sharyne Peach, MD Location: [facility] Note by: Vevelyn Francois NP Date: 06/17/2018; Time: 12:12 PM  Pain Score Disclaimer: We use the NRS-11 scale. This  is a self-reported, subjective measurement of pain severity with only modest accuracy. It is used primarily to identify changes within a particular patient. It must be understood that outpatient pain scales are significantly less accurate that those used for research, where they can be applied under ideal  controlled circumstances with minimal exposure to variables. In reality, the score is likely to be a combination of pain intensity and pain affect, where pain affect describes the degree of emotional arousal or changes in action readiness caused by the sensory experience of pain. Factors such as social and work situation, setting, emotional state, anxiety levels, expectation, and prior pain experience may influence pain perception and show large inter-individual differences that may also be affected by time variables.  Patient instructions provided during this appointment: Patient Instructions   ____________________________________________________________________________________________  Medication Rules  Applies to: All patients receiving prescriptions (written or electronic).  Pharmacy of record: Pharmacy where electronic prescriptions will be sent. If written prescriptions are taken to a different pharmacy, please inform the nursing staff. The pharmacy listed in the electronic medical record should be the one where you would like electronic prescriptions to be sent.  Prescription refills: Only during scheduled appointments. Applies to both, written and electronic prescriptions.  NOTE: The following applies primarily to controlled substances (Opioid* Pain Medications).   Patient's responsibilities: 1. Pain Pills: Bring all pain pills to every appointment (except for procedure appointments). 2. Pill Bottles: Bring pills in original pharmacy bottle. Always bring newest bottle. Bring bottle, even if empty. 3. Medication refills: You are responsible for knowing and keeping track of what  medications you need refilled. The day before your appointment, write a list of all prescriptions that need to be refilled. Bring that list to your appointment and give it to the admitting nurse. Prescriptions will be written only during appointments. If you forget a medication, it will not be "Called in", "Faxed", or "electronically sent". You will need to get another appointment to get these prescribed. 4. Prescription Accuracy: You are responsible for carefully inspecting your prescriptions before leaving our office. Have the discharge nurse carefully go over each prescription with you, before taking them home. Make sure that your name is accurately spelled, that your address is correct. Check the name and dose of your medication to make sure it is accurate. Check the number of pills, and the written instructions to make sure they are clear and accurate. Make sure that you are given enough medication to last until your next medication refill appointment. 5. Taking Medication: Take medication as prescribed. Never take more pills than instructed. Never take medication more frequently than prescribed. Taking less pills or less frequently is permitted and encouraged, when it comes to controlled substances (written prescriptions).  6. Inform other Doctors: Always inform, all of your healthcare providers, of all the medications you take. 7. Pain Medication from other Providers: You are not allowed to accept any additional pain medication from any other Doctor or Healthcare provider. There are two exceptions to this rule. (see below) In the event that you require additional pain medication, you are responsible for notifying us, as stated below. 8. Medication Agreement: You are responsible for carefully reading and following our Medication Agreement. This must be signed before receiving any prescriptions from our practice. Safely store a copy of your signed Agreement. Violations to the Agreement will result in no  further prescriptions. (Additional copies of our Medication Agreement are available upon request.) 9. Laws, Rules, & Regulations: All patients are expected to follow all Federal and [employer], [employer], Rules, [employer]. Ignorance of the Laws does not constitute a valid excuse. The use of any illegal substances is prohibited. 10. Adopted CDC guidelines & recommendations: Target dosing levels will be at or below 60 MME/day. Use of benzodiazepines** is not  recommended.  Exceptions: There are only two exceptions to the rule of not receiving pain medications from other Healthcare Providers. 1. Exception #1 (Emergencies): In the event of an emergency (i.e.: accident requiring emergency care), you are allowed to receive additional pain medication. However, you are responsible for: As soon as you are able, call our office [phone], at any time of the day or night, and leave a message stating your name, the date and nature of the emergency, and the name and dose of the medication prescribed. In the event that your call is answered by a member of our staff, make sure to document and save the date, time, and the name of the person that took your information.  2. Exception #2 (Planned Surgery): In the event that you are scheduled by another doctor or dentist to have any type of surgery or procedure, you are allowed (for a period no longer than 30 days), to receive additional pain medication, for the acute post-op pain. However, in this case, you are responsible for Acosta up a copy of our "Post-op Pain Management for Surgeons" handout, and giving it to your surgeon or dentist. This document is available at our office, and does not require an appointment to obtain it. Simply go to our office during business hours (Monday-Thursday from 8:00 AM to 4:00 PM) (Friday 8:00 AM to 12:00 Noon) or if you have a scheduled appointment with [place], prior to your surgery, and ask for it by name. In addition, you will need to  provide [place] with your name, name of your surgeon, type of surgery, and date of procedure or surgery.  *Opioid medications include: morphine, codeine, oxycodone, oxymorphone, hydrocodone, hydromorphone, meperidine, tramadol, tapentadol, buprenorphine, fentanyl, methadone. **Benzodiazepine medications include: diazepam (Valium), alprazolam (Xanax), clonazepam (Klonopine), lorazepam (Ativan), clorazepate (Tranxene), chlordiazepoxide (Librium), estazolam (Prosom), oxazepam (Serax), temazepam (Restoril), triazolam (Halcion) (Last updated: 01/28/2018) ____________________________________________________________________________________________    BMI Assessment: Estimated body mass index is 38.71 kg/m as calculated from the following:   Height as of this encounter: 5' 3.5" (1.613 m).   Weight as of this encounter: 222 lb (100.7 kg).  BMI interpretation table: BMI level Category Range association with higher incidence of chronic pain  <18 kg/m2 Underweight   18.5-24.9 kg/m2 Ideal body weight   25-29.9 kg/m2 Overweight Increased incidence by 20%  30-34.9 kg/m2 Obese (Class I) Increased incidence by 68%  35-39.9 kg/m2 Severe obesity (Class II) Increased incidence by 136%  >40 kg/m2 Extreme obesity (Class III) Increased incidence by 254%   Patient's current BMI Ideal Body weight  Body mass index is 38.71 kg/m. Ideal body weight: 53.5 kg (118 lb 0.9 oz) Adjusted ideal body weight: 72.4 kg (159 lb 10.1 oz)   BMI Readings from Last 4 Encounters:  06/17/18 38.71 kg/m  05/04/18 39.33 kg/m  03/16/18 39.33 kg/m  12/03/17 39.50 kg/m   Wt Readings from Last 4 Encounters:  06/17/18 222 lb (100.7 kg)  05/04/18 222 lb (100.7 kg)  03/16/18 222 lb (100.7 kg)  12/03/17 223 lb (101.2 kg)

## 2018-06-22 LAB — TOXASSURE SELECT 13 (MW), URINE

## 2018-06-29 ENCOUNTER — Ambulatory Visit
Admission: RE | Admit: 2018-06-29 | Discharge: 2018-06-29 | Disposition: A | Discharge status: Home / Self Care | Payer: Medicare Other | Source: Ambulatory Visit | Attending: Pain Medicine | Admitting: Pain Medicine

## 2018-06-29 ENCOUNTER — Encounter: Payer: Self-pay | Admitting: Pain Medicine

## 2018-06-29 ENCOUNTER — Other Ambulatory Visit: Payer: Self-pay

## 2018-06-29 ENCOUNTER — Ambulatory Visit: Payer: Medicare Other | Admitting: Pain Medicine

## 2018-06-29 ENCOUNTER — Ambulatory Visit (HOSPITAL_BASED_OUTPATIENT_CLINIC_OR_DEPARTMENT_OTHER): Payer: Medicare Other | Admitting: Pain Medicine

## 2018-06-29 VITALS — BP 120/49 | HR 95 | Temp 97.5°F | Resp 15 | Ht 63.5 in | Wt 222.0 lb

## 2018-06-29 DIAGNOSIS — M5030 Other cervical disc degeneration, unspecified cervical region: Secondary | ICD-10-CM

## 2018-06-29 DIAGNOSIS — Z881 Allergy status to other antibiotic agents status: Secondary | ICD-10-CM | POA: Diagnosis not present

## 2018-06-29 DIAGNOSIS — Z7952 Long term (current) use of systemic steroids: Secondary | ICD-10-CM | POA: Diagnosis not present

## 2018-06-29 DIAGNOSIS — M797 Fibromyalgia: Secondary | ICD-10-CM | POA: Diagnosis not present

## 2018-06-29 DIAGNOSIS — M47812 Spondylosis without myelopathy or radiculopathy, cervical region: Secondary | ICD-10-CM

## 2018-06-29 DIAGNOSIS — Z886 Allergy status to analgesic agent status: Secondary | ICD-10-CM | POA: Diagnosis not present

## 2018-06-29 DIAGNOSIS — G4486 Cervicogenic headache: Secondary | ICD-10-CM

## 2018-06-29 DIAGNOSIS — Z9889 Other specified postprocedural states: Secondary | ICD-10-CM | POA: Insufficient documentation

## 2018-06-29 DIAGNOSIS — M5481 Occipital neuralgia: Secondary | ICD-10-CM

## 2018-06-29 DIAGNOSIS — R51 Headache: Principal | ICD-10-CM

## 2018-06-29 DIAGNOSIS — Z79899 Other long term (current) drug therapy: Secondary | ICD-10-CM | POA: Diagnosis not present

## 2018-06-29 DIAGNOSIS — M792 Neuralgia and neuritis, unspecified: Secondary | ICD-10-CM

## 2018-06-29 DIAGNOSIS — M542 Cervicalgia: Secondary | ICD-10-CM

## 2018-06-29 DIAGNOSIS — Z888 Allergy status to other drugs, medicaments and biological substances status: Secondary | ICD-10-CM | POA: Insufficient documentation

## 2018-06-29 DIAGNOSIS — Z79891 Long term (current) use of opiate analgesic: Secondary | ICD-10-CM | POA: Diagnosis not present

## 2018-06-29 DIAGNOSIS — Z981 Arthrodesis status: Secondary | ICD-10-CM | POA: Insufficient documentation

## 2018-06-29 MED ORDER — ROPIVACAINE HCL 2 MG/ML IJ SOLN
3.0000 mL | Freq: Once | INTRAMUSCULAR | Status: AC
  Administered 2018-06-29: 10 mL via EPIDURAL
  Filled 2018-06-29: qty 10

## 2018-06-29 MED ORDER — FENTANYL CITRATE (PF) 100 MCG/2ML IJ SOLN
25.0000 ug | INTRAMUSCULAR | Status: DC | PRN
  Administered 2018-06-29: 50 ug via INTRAVENOUS
  Filled 2018-06-29: qty 2

## 2018-06-29 MED ORDER — MIDAZOLAM HCL 5 MG/5ML IJ SOLN
1.0000 mg | INTRAMUSCULAR | Status: DC | PRN
  Administered 2018-06-29: 2 mg via INTRAVENOUS
  Filled 2018-06-29: qty 5

## 2018-06-29 MED ORDER — DEXAMETHASONE SODIUM PHOSPHATE 10 MG/ML IJ SOLN
10.0000 mg | Freq: Once | INTRAMUSCULAR | Status: AC
  Administered 2018-06-29: 10 mg
  Filled 2018-06-29: qty 1

## 2018-06-29 MED ORDER — LACTATED RINGERS IV SOLN
1000.0000 mL | Freq: Once | INTRAVENOUS | Status: AC
  Administered 2018-06-29: 1000 mL via INTRAVENOUS

## 2018-06-29 MED ORDER — LIDOCAINE HCL 2 % IJ SOLN
20.0000 mL | Freq: Once | INTRAMUSCULAR | Status: AC
  Administered 2018-06-29: 400 mg
  Filled 2018-06-29: qty 40

## 2018-06-29 MED ORDER — GABAPENTIN 100 MG PO CAPS: 100.0000 mg | ORAL_CAPSULE | Freq: Every day | ORAL | 0 refills | Status: DC

## 2018-06-29 NOTE — Progress Notes (Signed)
Safety precautions to be maintained throughout the outpatient stay will include: orient to surroundings, keep bed in low position, maintain call bell within reach at all times, provide assistance with transfer out of bed and ambulation.  

## 2018-06-29 NOTE — Progress Notes (Signed)
Patient's Name: Amber Acosta  MRN: [id number]  Referring Provider: Rayetta Humphrey, MD  DOB: 06-10-1951  PCP: Rayetta Humphrey, MD  DOS: 06/29/2018  Note by: Oswaldo Done, MD  Service setting: Ambulatory outpatient  Specialty: Interventional Pain Management  Patient type: Established  Location: [facility] (AMB) Pain Management Facility  Visit type: Interventional Procedure   Primary Reason for Visit: Interventional Pain Management Treatment. CC: Procedure  Procedure:          Anesthesia, Analgesia, Anxiolysis:  Type: Diagnostic C2 + TON Nerve Block Region: Lateral Cervical Spine Level: C2 & C3 Level   Laterality: Left-Sided  Type: Moderate (Conscious) Sedation combined with Local Anesthesia Indication(s): Analgesia and Anxiety Route: Intravenous (IV) IV Access: Secured Sedation: Meaningful verbal contact was maintained at all times during the procedure  Local Anesthetic: Lidocaine 1-2%   Indications: 1. Cervicogenic headache (Left)   2. Chronic Occipital neuralgia (Left)   3. Spondylosis without myelopathy or radiculopathy, cervical region   4. DDD (degenerative disc disease), cervical   5. Cervical facet syndrome (Left)   6. Cervicalgia    Pain Score: Pre-procedure: 5 /10 Post-procedure: 0-No pain/10  Pre-op Assessment:  Amber Acosta is a 67 y.o. (year old), female patient, seen today for interventional treatment. She  has a past surgical history that includes neck fusion; Breast surgery; Back surgery; Knee arthroscopy; Fracture surgery; Hand surgery; and Abdominal hysterectomy. Amber Acosta has a current medication list which includes the following prescription(s): advair diskus, albuterol, amlodipine, b-d ins syr ultrafine 1cc/30g, b-d ins syr ultrafine 1cc/31g, famotidine, fluoxetine, furosemide, gabapentin, klor-con 10, losartan, lovastatin, metoprolol succinate, nitroglycerin, omeprazole, oxycodone, oxycodone, oxycodone, ranitidine, tizanidine, trazodone, hydralazine, insulin nph  human, and insulin nph human, and the following Facility-Administered Medications: fentanyl and midazolam. Her primarily concern today is the Procedure  Initial Vital Signs:  Pulse/HCG Rate: 91ECG Heart Rate: 74 Temp: 98.5 F (36.9 C) Resp: 18 BP: (!) 124/53 SpO2: 97 %  BMI: Estimated body mass index is 38.71 kg/m as calculated from the following:   Height as of this encounter: 5' 3.5" (1.613 m).   Weight as of this encounter: 222 lb (100.7 kg).  Risk Assessment: Allergies: Reviewed. She is allergic to aspirin; ciprofloxacin; etanercept; metformin; methotrexate; mushroom extract complex; other; darvon [propoxyphene]; minocycline; and cobalamine combinations.  Allergy Precautions: None required Coagulopathies: Reviewed. None identified.  Blood-thinner therapy: None at this time Active Infection(s): Reviewed. None identified. Amber Acosta is afebrile  Site Confirmation: Amber Acosta was asked to confirm the procedure and laterality before marking the site Procedure checklist: Completed Consent: Before the procedure and under the influence of no sedative(s), amnesic(s), or anxiolytics, the patient was informed of the treatment options, risks and possible complications. To fulfill our ethical and legal obligations, as recommended by the American Medical Association's Code of Ethics, I have informed the patient of my clinical impression; the nature and purpose of the treatment or procedure; the risks, benefits, and possible complications of the intervention; the alternatives, including doing nothing; the risk(s) and benefit(s) of the alternative treatment(s) or procedure(s); and the risk(s) and benefit(s) of doing nothing. The patient was provided information about the general risks and possible complications associated with the procedure. These may include, but are not limited to: failure to achieve desired goals, infection, bleeding, organ or nerve damage, allergic reactions, paralysis, and  death. In addition, the patient was informed of those risks and complications associated to the procedure, such as failure to decrease pain; infection; bleeding; organ or nerve damage with subsequent damage  to sensory, motor, and/or autonomic systems, resulting in permanent pain, numbness, and/or weakness of one or several areas of the body; allergic reactions; (i.e.: anaphylactic reaction); and/or death. Furthermore, the patient was informed of those risks and complications associated with the medications. These include, but are not limited to: allergic reactions (i.e.: anaphylactic or anaphylactoid reaction(s)); adrenal axis suppression; blood sugar elevation that in diabetics may result in ketoacidosis or comma; water retention that in patients with history of congestive heart failure may result in shortness of breath, pulmonary edema, and decompensation with resultant heart failure; weight gain; swelling or edema; medication-induced neural toxicity; particulate matter embolism and blood vessel occlusion with resultant organ, and/or nervous system infarction; and/or aseptic necrosis of one or more joints. Finally, the patient was informed that Medicine is not an exact science; therefore, there is also the possibility of unforeseen or unpredictable risks and/or possible complications that may result in a catastrophic outcome. The patient indicated having understood very clearly. We have given the patient no guarantees and we have made no promises. Enough time was given to the patient to ask questions, all of which were answered to the patient's satisfaction. Amber Acosta has indicated that she wanted to continue with the procedure. Attestation: I, the ordering provider, attest that I have discussed with the patient the benefits, risks, side-effects, alternatives, likelihood of achieving goals, and potential problems during recovery for the procedure that I have provided informed consent. Date  Time: 06/29/2018  10:23 AM  Pre-Procedure Preparation:  Monitoring: As per clinic protocol. Respiration, ETCO2, SpO2, BP, heart rate and rhythm monitor placed and checked for adequate function Safety Precautions: Patient was assessed for positional comfort and pressure points before starting the procedure. Time-out: I initiated and conducted the "Time-out" before starting the procedure, as per protocol. The patient was asked to participate by confirming the accuracy of the "Time Out" information. Verification of the correct person, site, and procedure were performed and confirmed by me, the nursing staff, and the patient. "Time-out" conducted as per Joint Commission's Universal Protocol (UP.01.01.01). Time: 1149  Description of Procedure:          Position: Lateral decubitus with painful side up Target Area: C2 + C3 cervical neural foramen Approach: Lateral approach Area Prepped: Anterior, posterior, and lateral aspect of neck, below the ear lobe. Prepping solution: ChloraPrep (2% chlorhexidine gluconate and 70% isopropyl alcohol) Safety Precautions: Aspiration looking for blood return was conducted prior to all injections. At no point did we inject any substances, as a needle was being advanced. No attempts were made at seeking any paresthesias. Safe injection practices and needle disposal techniques used. Medications properly checked for expiration dates. SDV (single dose vial) medications used. Description of the Procedure: Protocol guidelines were followed. The target area was identified and the area prepped in the usual manner. Skin & deeper tissues infiltrated with local anesthetic. Appropriate amount of time allowed to pass for local anesthetics to take effect. The procedure needles were then advanced to the target area. Proper needle placement secured. Negative aspiration confirmed. Solution injected in intermittent fashion, asking for systemic symptoms every 0.5cc of injectate. The needles were then removed  and the area cleansed, making sure to leave some of the prepping solution back to take advantage of its long term bactericidal properties.   Vitals:   06/29/18 1200 06/29/18 1211 06/29/18 1219 06/29/18 1229  BP: (!) 103/45 (!) 117/52 127/81 (!) 120/49  Pulse:    95  Resp: 20 (!) 21 (!) 24 15  Temp:  Marland Kitchen)  97.2 F (36.2 C)  (!) 97.5 F (36.4 C)  TempSrc:  Temporal  Temporal  SpO2: 94% 97% 98% 98%  Weight:      Height:        Start Time: 1149 hrs. End Time: 1159 hrs. Materials:  Needle(s) Type: Regular needle Gauge: 22G Length: 3.5-in Medication(s): Please see orders for medications and dosing details.  Imaging Guidance (Non-Spinal):          Type of Imaging Technique: Fluoroscopy Guidance (Non-Spinal) Indication(s): Assistance in needle guidance and placement for procedures requiring needle placement in or near specific anatomical locations not easily accessible without such assistance. Exposure Time: Please see nurses notes. Contrast: None used. Fluoroscopic Guidance: I was personally present during the use of fluoroscopy. "Tunnel Vision Technique" used to obtain the best possible view of the target area. Parallax error corrected before commencing the procedure. "Direction-depth-direction" technique used to introduce the needle under continuous pulsed fluoroscopy. Once target was reached, antero-posterior, oblique, and lateral fluoroscopic projection used confirm needle placement in all planes. Images permanently stored in EMR. Interpretation: No contrast injected. I personally interpreted the imaging intraoperatively. Adequate needle placement confirmed in multiple planes. Permanent images saved into the patient's record.  Antibiotic Prophylaxis:   Anti-infectives (From admission, onward)   None     Indication(s): None identified  Post-operative Assessment:  Post-procedure Vital Signs:  Pulse/HCG Rate: 9572 Temp: (!) 97.5 F (36.4 C) Resp: 15 BP: (!) 120/49 SpO2: 98  %  EBL: None  Complications: No immediate post-treatment complications observed by team, or reported by patient.  Note: The patient tolerated the entire procedure well. A repeat set of vitals were taken after the procedure and the patient was kept under observation following institutional policy, for this type of procedure. Post-procedural neurological assessment was performed, showing return to baseline, prior to discharge. The patient was provided with post-procedure discharge instructions, including a section on how to identify potential problems. Should any problems arise concerning this procedure, the patient was given instructions to immediately contact us, at any time, without hesitation. In any case, we plan to contact the patient by telephone for a follow-up status report regarding this interventional procedure.  Comments:  No additional relevant information.  Plan of Care    Imaging Orders     DG C-Arm 1-60 Min-No Report  Procedure Orders     SELECTIVE NERVE ROOT  Medications ordered for procedure: Meds ordered this encounter  Medications  . gabapentin (NEURONTIN) 100 MG capsule    Sig: Take 1-3 capsules (100-300 mg total) by mouth at bedtime. Follow written titration schedule.    Dispense:  90 capsule    Refill:  0    Do not place medication on "Automatic Refill". Fill one day early if pharmacy is closed on scheduled refill date.  . lidocaine (XYLOCAINE) 2 % (with pres) injection 400 mg  . midazolam (VERSED) 5 MG/5ML injection 1-2 mg    Make sure Flumazenil is available in the pyxis when using this medication. If oversedation occurs, administer 0.2 mg IV over 15 sec. If after 45 sec no response, administer 0.2 mg again over 1 min; may repeat at 1 min intervals; not to exceed 4 doses (1 mg)  . fentaNYL (SUBLIMAZE) injection 25-50 mcg    Make sure Narcan is available in the pyxis when using this medication. In the event of respiratory depression (RR< 8/min): Titrate NARCAN  (naloxone) in increments of 0.1 to 0.2 mg IV at 2-3 minute intervals, until desired degree of reversal.  .  lactated ringers infusion 1,000 mL  . ropivacaine (PF) 2 mg/mL (0.2%) (NAROPIN) injection 3 mL  . dexamethasone (DECADRON) injection 10 mg   Medications administered: We administered lidocaine, midazolam, fentaNYL, lactated ringers, ropivacaine (PF) 2 mg/mL (0.2%), and dexamethasone.  See the medical record for exact dosing, route, and time of administration.  New Prescriptions   GABAPENTIN (NEURONTIN) 100 MG CAPSULE    Take 1-3 capsules (100-300 mg total) by mouth at bedtime. Follow written titration schedule.   Disposition: Discharge home  Discharge Date & Time: 06/29/2018; 1233 hrs.   Physician-requested Follow-up: Return for post-procedure eval (2 wks).  Future Appointments  Date Time Provider Department Center  07/26/2018  1:15 PM Delano Metz, MD [facility] None  09/14/2018  9:15 AM Barbette Merino, NP [facility] None   Primary Care Physician: Rayetta Humphrey, MD Location: [facility] Note by: Oswaldo Done, MD Date: 06/29/2018; Time: 1:01 PM  Disclaimer:  Medicine is not an Visual merchandiser. The only guarantee in medicine is that nothing is guaranteed. It is important to note that the decision to proceed with this intervention was based on the information collected from the patient. The Data and conclusions were drawn from the patient's questionnaire, the interview, and the physical examination. Because the information was provided in large part by the patient, it cannot be guaranteed that it has not been purposely or unconsciously manipulated. Every effort has been made to obtain as much relevant data as possible for this evaluation. It is important to note that the conclusions that lead to this procedure are derived in large part from the available data. Always take into account that the treatment will also be dependent on availability of  resources and existing treatment guidelines, considered by other Pain Management Practitioners as being common knowledge and practice, at the time of the intervention. For Medico-Legal purposes, it is also important to point out that variation in procedural techniques and pharmacological choices are the acceptable norm. The indications, contraindications, technique, and results of the above procedure should only be interpreted and judged by a Board-Certified Interventional Pain Specialist with extensive familiarity and expertise in the same exact procedure and technique.

## 2018-06-29 NOTE — Patient Instructions (Signed)
____________________________________________________________________________________________  Initial Gabapentin Titration  Medication used: Gabapentin (Generic Name) or Neurontin (Brand Name) 100 mg tablets/capsules  Reasons to stop increasing the dose:  Reason 1: You get good relief of symptoms, in which case there is no need to increase the daily dose any further.    Reason 2: You develop some side effects, such as sleeping all of the time, difficulty concentrating, or becoming disoriented, in which case you need to go down on the dose, to the prior level, where you were not experiencing any side effects. Stay on that dose longer, to allow more time for your body to get use it, before attempting to increase it again.   Steps: Step 1: Start by taking 1 (one) tablet at bedtime x 7 (seven) days.  Step 2: After being on 1 (one) tablet for 7 (seven) days, then increase it to 2 (two) tablets at bedtime for another 7 (seven) days.  Step 3: Next, after being on 2 (two) tablets at bedtime for 7 (seven) days, then increase it to 3 (three) tablets at bedtime, and stay on that dose until you see your doctor.  Reasons to stop increasing the dose: Reason 1: You get good relief of symptoms, in which case there is no need to increase the daily dose any further.  Reason 2: You develop some side effects, such as sleeping all of the time, difficulty concentrating, or becoming disoriented, in which case you need to go down on the dose, to the prior level, where you were not experiencing any side effects. Stay on that dose longer, to allow more time for your body to get use it, before attempting to increase it again.  Endpoint: Once you have reached the maximum dose you can tolerate without side-effects, contact your physician so as to evaluate the results of the regimen.   Questions: Feel free to contact us for any questions or problems at [phone]  [phone] ____________________________________________________________________________________________ ____________________________________________________________________________________________  Post-Procedure Discharge Instructions  Instructions:  Apply ice: Fill a plastic sandwich bag with crushed ice. Cover it with a small towel and apply to injection site. Apply for 15 minutes then remove x 15 minutes. Repeat sequence on day of procedure, until you go to bed. The purpose is to minimize swelling and discomfort after procedure.  Apply heat: Apply heat to procedure site starting the day following the procedure. The purpose is to treat any soreness and discomfort from the procedure.  Food intake: Start with clear liquids (like water) and advance to regular food, as tolerated.   Physical activities: Keep activities to a minimum for the first 8 hours after the procedure.   Driving: If you have received any sedation, you are not allowed to drive for 24 hours after your procedure.  Blood thinner: Restart your blood thinner 6 hours after your procedure. (Only for those taking blood thinners)  Insulin: As soon as you can eat, you may resume your normal dosing schedule. (Only for those taking insulin)  Infection prevention: Keep procedure site clean and dry.  Post-procedure Pain Diary: Extremely important that this be done correctly and accurately. Recorded information will be used to determine the next step in treatment.  Pain evaluated is that of treated area only. Do not include pain from an untreated area.  Complete every hour, on the hour, for the initial 8 hours. Set an alarm to help you do this part accurately.  Do not go to sleep and have it completed later. It will not be accurate.  Follow-up  appointment: Keep your follow-up appointment after the procedure. Usually 2 weeks for most procedures. (6 weeks in the case of radiofrequency.) Bring you pain diary.   Expect:  From  numbing medicine (AKA: Local Anesthetics): Numbness or decrease in pain.  Onset: Full effect within 15 minutes of injected.  Duration: It will depend on the type of local anesthetic used. On the average, 1 to 8 hours.   From steroids: Decrease in swelling or inflammation. Once inflammation is improved, relief of the pain will follow.  Onset of benefits: Depends on the amount of swelling present. The more swelling, the longer it will take for the benefits to be seen. In some cases, up to 10 days.  Duration: Steroids will stay in the system x 2 weeks. Duration of benefits will depend on multiple posibilities including persistent irritating factors.  From procedure: Some discomfort is to be expected once the numbing medicine wears off. This should be minimal if ice and heat are applied as instructed.  Call if:  You experience numbness and weakness that gets worse with time, as opposed to wearing off.  New onset bowel or bladder incontinence. (This applies to Spinal procedures only)  Emergency Numbers:  Durning business hours (Monday - Thursday, 8:00 AM - 4:00 PM) (Friday, 9:00 AM - 12:00 Noon): [phone]  After hours: [phone] ____________________________________________________________________________________________

## 2018-06-30 ENCOUNTER — Telehealth: Payer: Self-pay | Admitting: *Deleted

## 2018-06-30 NOTE — Telephone Encounter (Signed)
No problems post procedure. 

## 2018-07-01 ENCOUNTER — Ambulatory Visit: Payer: Medicare Other | Admitting: Pain Medicine

## 2018-07-25 ENCOUNTER — Other Ambulatory Visit: Payer: Self-pay | Admitting: Pain Medicine

## 2018-07-25 DIAGNOSIS — M797 Fibromyalgia: Secondary | ICD-10-CM

## 2018-07-25 DIAGNOSIS — M792 Neuralgia and neuritis, unspecified: Secondary | ICD-10-CM

## 2018-07-25 NOTE — Progress Notes (Signed)
Patient's Name: Amber Acosta  MRN: [id number]  Referring Provider: Sharyne Peach, MD  DOB: [birth year]  PCP: Sharyne Peach, MD  DOS: 07/26/2018  Note by: Gaspar Cola, MD  Service setting: Ambulatory outpatient  Specialty: Interventional Pain Management  Location: [facility] (AMB) Pain Management Facility    Patient type: Established   Primary Reason(s) for Visit: Encounter for post-procedure evaluation of chronic illness with mild to moderate exacerbation CC: Neck Pain  HPI  Amber Acosta is a 67 y.o. year old, female patient, who comes today for a post-procedure evaluation. She has Cervical vertebral fusion (Uncomplicated C5 through C7 ACDF.); Fibromyalgia; Lumbar facet joint pain; Lumbar facet syndrome (Bilateral) (R>L); Stage 3 chronic kidney disease ([place]); COPD (chronic obstructive pulmonary disease) ([place]); Depression; Type 2 diabetes mellitus ([place]); Hypertensive heart disease with congestive heart failure ([place]); Gall bladder polyp; GERD (gastroesophageal reflux disease); Hyperlipemia; Inflammatory polyarthropathy ([place]); Sleep terror; Obesity, Class II, BMI 35-39.9, with comorbidity; OP (osteoporosis); Psoriasis; Kidney cysts; Muscle weakness (generalized); Sarcoidosis; Thrombocytopenia ([place]); Vitamin D deficiency; Chronic neck pain (Secondary source of pain) (Bilateral) (L>R); Cervicogenic headache (Left); Cervical facet syndrome (Left); At risk for falls; Balance problems; History of closed head injury; Opiate use (22.5 MME/Day); Long term current use of opiate analgesic; Encounter for therapeutic drug level monitoring; Long term prescription opiate use; Spondylolisthesis of cervical region; Cervical spine ankylosis; Hx of cervical spine surgery; Low magnesium levels; Myofascial pain; Neuropathy; Benign paroxysmal positional vertigo; Diabetic peripheral neuropathy ([place]); Meralgia paresthetica (Right); Cervical spondylosis; Chronic diastolic congestive heart failure ([place]); Mixed stress and urge urinary  incontinence; Overactive detrusor; 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[place]); Chronic low back pain (Primary Source of Pain) (Bilateral) (R>L); Chronic pain syndrome; Sarcoidosis of lung ([place]); Chronic back pain; Chronic Occipital neuralgia (Left); Epigastric pain; Displacement of cervical intervertebral disc without myelopathy; Essential hypertension; Hypertension; Morbid obesity with BMI of 40.0-44.9, adult ([place]); Recurrent major depressive disorder, in partial remission ([place]); Right arm weakness; Acute postoperative pain; Disturbance of skin sensation; Cervical spondylitis with radiculitis ([place]); Chronic shoulder pain [facility] source of pain) (Bilateral) (L>R); Spondylosis without myelopathy or radiculopathy, cervical region; Cervicalgia; Vasovagal episode; Nausea; Hypotension; Bradycardia; Neurogenic pain; and DDD (degenerative disc disease), cervical on their problem list. Her primarily concern today is the Neck Pain  Pain Assessment: Location:   Neck Radiating: radiates into both arms.  Right arm tingling down to last 2 fingers,  left arm, feels like electricity running down top of arm and into top of hand.  Onset: More than a month ago Duration: Chronic pain Quality: Sharp, Tender, Tingling, Stabbing, Sore Severity: 5 /10 (subjective, self-reported pain score)  Note: Reported level is compatible with observation.                         When using our objective Pain Scale, levels between 6 and 10/10 are said to belong in an emergency room, as it progressively worsens from a 6/10, described as severely limiting, requiring emergency care not usually available at an outpatient pain management facility. At a 6/10 level, communication becomes difficult and requires great effort. Assistance to reach the emergency department may be required. Facial flushing and profuse sweating along with potentially dangerous increases in heart rate and blood pressure will be evident. Effect on ADL: "I am unable to do house  work" Timing: Constant Modifying factors: medication BP: 117/70  HR: 68  Amber Acosta comes in today for post-procedure evaluation after the treatment done on 07/25/2018.  for post-procedure evaluation after the treatment done on 07/25/2018. Headaches are gone, but  still having right C7 numbness in pinky nad ring finger, when she is laying down. On the left arm she gets shooting electrical sensations with pain in the area between the shoulder blades (C5) (Rhomboid muscles).   Further details on both, my assessment(s), as well as the proposed treatment plan, please see below.  Post-Procedure Assessment  07/25/2018 Procedure: Diagnostic left-sided C2 + TON Nerve Block #1 under fluoroscopic guidance and IV sedation Pre-procedure pain score:  5/10 Post-procedure pain score: 0/10 (100% relief) Influential Factors: BMI: 39.68 kg/m Intra-procedural challenges: None observed.         Assessment challenges: None detected.              Reported side-effects: None.        Post-procedural adverse reactions or complications: None reported         Sedation: Sedation provided. When no sedatives are used, the analgesic levels obtained are directly associated to the effectiveness of the local anesthetics. However, when sedation is provided, the level of analgesia obtained during the initial 1 hour following the intervention, is believed to be the result of a combination of factors. These factors may include, but are not limited to: 1. The effectiveness of the local anesthetics used. 2. The effects of the analgesic(s) and/or anxiolytic(s) used. 3. The degree of discomfort experienced by the patient at the time of the procedure. 4. The patients ability and reliability in recalling and recording the events. 5. The presence and influence of possible secondary gains and/or psychosocial factors. Reported result: Relief experienced during the 1st hour after the procedure: 100 % (Ultra-Short Term Relief)            Interpretative annotation: Clinically appropriate result. Analgesia  during this period is likely to be Local Anesthetic and/or IV Sedative (Analgesic/Anxiolytic) related.          Effects of local anesthetic: The analgesic effects attained during this period are directly associated to the localized infiltration of local anesthetics and therefore cary significant diagnostic value as to the etiological location, or anatomical origin, of the pain. Expected duration of relief is directly dependent on the pharmacodynamics of the local anesthetic used. Long-acting (4-6 hours) anesthetics used.  Reported result: Relief during the next 4 to 6 hour after the procedure: 100 % (Short-Term Relief)            Interpretative annotation: Clinically appropriate result. Analgesia during this period is likely to be Local Anesthetic-related.          Long-term benefit: Defined as the period of time past the expected duration of local anesthetics (1 hour for short-acting and 4-6 hours for long-acting). With the possible exception of prolonged sympathetic blockade from the local anesthetics, benefits during this period are typically attributed to, or associated with, other factors such as analgesic sensory neuropraxia, antiinflammatory effects, or beneficial biochemical changes provided by agents other than the local anesthetics.  Reported result: Extended relief following procedure: 99 % (Long-Term Relief)            Interpretative annotation: Clinically possible results. Good relief. No permanent benefit expected. Inflammation plays a part in the etiology to the pain.          Current benefits: Defined as reported results that persistent at this point in time.   Analgesia: 100 %            Function: Somewhat improved ROM: Somewhat improved Interpretative annotation: Ongoing benefit. Therapeutic success. Effective diagnostic intervention.  Interpretation: Results would suggest a successful diagnostic intervention.                  Plan:  Set up procedure as a PRN palliative  treatment option for this patient.                Laboratory Chemistry  Inflammation Markers (CRP: Acute Phase) (ESR: Chronic Phase) Lab Results  Component Value Date   CRP 2.1 (H) 03/26/2016   ESRSEDRATE 52 (H) 03/26/2016                         Renal Markers Lab Results  Component Value Date   BUN 14 03/26/2016   CREATININE 1.28 (H) 03/26/2016   GFRAA 50 (L) 03/26/2016   GFRNONAA 44 (L) 03/26/2016                             Hepatic Markers Lab Results  Component Value Date   AST 24 03/26/2016   ALT 20 03/26/2016   ALBUMIN 4.0 03/26/2016                        Neuropathy Markers Lab Results  Component Value Date   VITAMINB12 628 06/22/2017                        Hematology Parameters Lab Results  Component Value Date   PLT 146 (L) 03/26/2016                        CV Markers No results found for: BNP, CKTOTAL, CKMB, TROPONINI                       Note: Lab results reviewed.  Recent Imaging Results   Results for orders placed in visit on 06/29/18  DG C-Arm 1-60 Min-No Report   Narrative Fluoroscopy was utilized by the requesting physician.  No radiographic  interpretation.    Interpretation Report: Fluoroscopy was used during the procedure to assist with needle guidance. The images were interpreted intraoperatively by the requesting physician.  Meds   Current Outpatient Medications:  .  ADVAIR DISKUS 250-50 MCG/DOSE AEPB, , Disp: , Rfl:  .  albuterol (PROVENTIL HFA;VENTOLIN HFA) 108 (90 Base) MCG/ACT inhaler, Inhale 2 puffs into the lungs every 6 (six) hours as needed. , Disp: , Rfl:  .  amLODipine (NORVASC) 10 MG tablet, Take 10 mg by mouth daily. , Disp: , Rfl:  .  B-D INS SYR ULTRAFINE 1CC/30G 30G X 1/2" 1 ML MISC, , Disp: , Rfl:  .  B-D INS SYR ULTRAFINE 1CC/31G 31G X 5/16" 1 ML MISC, , Disp: , Rfl:  .  FLUoxetine (PROZAC) 40 MG capsule, Take 40 mg by mouth daily. , Disp: , Rfl:  .  furosemide (LASIX) 40 MG tablet, Take 40 mg by mouth daily., Disp:  , Rfl:  .  gabapentin (NEURONTIN) 100 MG capsule, Take 1-3 capsules (100-300 mg total) by mouth 3 (three) times daily. Follow written titration schedule., Disp: 270 capsule, Rfl: 0 .  hydrALAZINE (APRESOLINE) 25 MG tablet, Take 25 mg by mouth 2 (two) times daily. , Disp: , Rfl:  .  insulin NPH Human (HUMULIN N,NOVOLIN N) 100 UNIT/ML injection, 90 units in the am and 40 units at supper, Disp: , Rfl:  .  KLOR-CON 10 10 MEQ  tablet, Take 10 mEq by mouth daily. , Disp: , Rfl:  .  losartan (COZAAR) 100 MG tablet, TAKE 1 TABLET (100 MG TOTAL) BY MOUTH ONCE DAILY., Disp: , Rfl:  .  lovastatin (MEVACOR) 10 MG tablet, Take 10 mg by mouth at bedtime., Disp: , Rfl:  .  metoprolol succinate (TOPROL-XL) 50 MG 24 hr tablet, Take 25 mg by mouth daily. , Disp: , Rfl:  .  nitroGLYCERIN (NITROSTAT) 0.3 MG SL tablet, 1 tablet sublingual prn for ESOPHAGEAL SPASMS(may repeat every 5 min, seek med help if pain persists after 3 tablets), Disp: , Rfl:  .  omeprazole (PRILOSEC) 40 MG capsule, Take 40 mg by mouth daily., Disp: , Rfl:  .  [START ON 09/13/2018] oxyCODONE (OXY IR/ROXICODONE) 5 MG immediate release tablet, Take 1 tablet (5 mg total) by mouth every 8 (eight) hours as needed for moderate pain or severe pain., Disp: 90 tablet, Rfl: 0 .  [START ON 08/14/2018] oxyCODONE (OXY IR/ROXICODONE) 5 MG immediate release tablet, Take 1 tablet (5 mg total) by mouth every 8 (eight) hours as needed for moderate pain or severe pain., Disp: 90 tablet, Rfl: 0 .  oxyCODONE (OXY IR/ROXICODONE) 5 MG immediate release tablet, Take 1 tablet (5 mg total) by mouth every 8 (eight) hours as needed for moderate pain or severe pain., Disp: 90 tablet, Rfl: 0 .  ranitidine (ZANTAC) 150 MG tablet, Take 150 mg by mouth 2 (two) times daily., Disp: , Rfl:  .  tiZANidine (ZANAFLEX) 4 MG tablet, Take 1 tablet (4 mg total) by mouth 2 (two) times daily as needed for muscle spasms., Disp: 60 tablet, Rfl: 2 .  traZODone (DESYREL) 100 MG tablet, Take 300 mg  by mouth at bedtime. , Disp: , Rfl:   ROS  Constitutional: Denies any fever or chills Gastrointestinal: No reported hemesis, hematochezia, vomiting, or acute GI distress Musculoskeletal: Denies any acute onset joint swelling, redness, loss of ROM, or weakness Neurological: No reported episodes of acute onset apraxia, aphasia, dysarthria, agnosia, amnesia, paralysis, loss of coordination, or loss of consciousness  Allergies  Amber Acosta is allergic to aspirin; ciprofloxacin; etanercept; metformin; methotrexate; mushroom extract complex; other; darvon [propoxyphene]; minocycline; and cobalamine combinations.  PFSH  Drug: Amber Acosta  reports that she does not use drugs. Alcohol:  reports that she does not drink alcohol. Tobacco:  reports that she has quit smoking. She has quit using smokeless tobacco. Medical:  has a past medical history of Arthritis, Cervical spondylosis without myelopathy (09/21/2015), Chronic pain, Chronic pain associated with significant psychosocial dysfunction (08/28/2013), Depression, Diabetes mellitus without complication ([place]), Displacement of cervical intervertebral disc without myelopathy (09/05/2015), Fibromyalgia, GERD (gastroesophageal reflux disease), Hypertension, Kidney disease, chronic, stage III (GFR 30-59 ml/min) ([place]), Sarcoid, and Thrombopenia ([place]). Surgical: Amber Acosta  has a past surgical history that includes neck fusion; Breast surgery; Back surgery; Knee arthroscopy; Fracture surgery; Hand surgery; and Abdominal hysterectomy. Family: family history includes Alcohol abuse in her brother; Alzheimer's disease in her mother; COPD in her father; Diabetes in her father; Heart disease in her father.  Constitutional Exam  General appearance: Well nourished, well developed, and well hydrated. In no apparent acute distress Vitals:   07/26/18 1346  BP: 117/70  Pulse: 68  Temp: 98.2 F (36.8 C)  Weight: 224 lb (101.6 kg)  Height: '5\' 3"'$  (1.6 m)   BMI Assessment:  Estimated body mass index is 39.68 kg/m as calculated from the following:   Height as of this encounter: '5\' 3"'$  (1.6 m).  Weight as of this encounter: 224 lb (101.6 kg).  BMI interpretation table: BMI level Category Range association with higher incidence of chronic pain  <18 kg/m2 Underweight   18.5-24.9 kg/m2 Ideal body weight   25-29.9 kg/m2 Overweight Increased incidence by 20%  30-34.9 kg/m2 Obese (Class I) Increased incidence by 68%  35-39.9 kg/m2 Severe obesity (Class II) Increased incidence by 136%  >40 kg/m2 Extreme obesity (Class III) Increased incidence by 254%   Patient's current BMI Ideal Body weight  Body mass index is 39.68 kg/m. Ideal body weight: 52.4 kg (115 lb 8.3 oz) Adjusted ideal body weight: 72.1 kg (158 lb 14.6 oz)   BMI Readings from Last 4 Encounters:  07/26/18 39.68 kg/m  06/29/18 38.71 kg/m  06/17/18 38.71 kg/m  05/04/18 39.33 kg/m   Wt Readings from Last 4 Encounters:  07/26/18 224 lb (101.6 kg)  06/29/18 222 lb (100.7 kg)  06/17/18 222 lb (100.7 kg)  05/04/18 222 lb (100.7 kg)  Psych/Mental status: Alert, oriented x 3 (person, place, & time)       Eyes: PERLA Respiratory: No evidence of acute respiratory distress  Cervical Spine Area Exam  Skin & Axial Inspection: No masses, redness, edema, swelling, or associated skin lesions Alignment: Symmetrical Functional ROM: Improved after treatment      Stability: No instability detected Muscle Tone/Strength: Functionally intact. No obvious neuro-muscular anomalies detected. Sensory (Neurological): Movement-associated discomfort Palpation: No palpable anomalies              Upper Extremity (UE) Exam    Side: Right upper extremity  Side: Left upper extremity  Skin & Extremity Inspection: Skin color, temperature, and hair growth are WNL. No peripheral edema or cyanosis. No masses, redness, swelling, asymmetry, or associated skin lesions. No contractures.  Skin & Extremity Inspection: Skin color,  temperature, and hair growth are WNL. No peripheral edema or cyanosis. No masses, redness, swelling, asymmetry, or associated skin lesions. No contractures.  Functional ROM: Unrestricted ROM          Functional ROM: Unrestricted ROM          Muscle Tone/Strength: Functionally intact. No obvious neuro-muscular anomalies detected.  Muscle Tone/Strength: Functionally intact. No obvious neuro-muscular anomalies detected.  Sensory (Neurological): Unimpaired          Sensory (Neurological): Unimpaired          Palpation: No palpable anomalies              Palpation: No palpable anomalies              Provocative Test(s):  Phalen's test: deferred Tinel's test: deferred Apley's scratch test (touch opposite shoulder):  Action 1 (Across chest): deferred Action 2 (Overhead): deferred Action 3 (LB reach): deferred   Provocative Test(s):  Phalen's test: deferred Tinel's test: deferred Apley's scratch test (touch opposite shoulder):  Action 1 (Across chest): deferred Action 2 (Overhead): deferred Action 3 (LB reach): deferred    Thoracic Spine Area Exam  Skin & Axial Inspection: No masses, redness, or swelling Alignment: Symmetrical Functional ROM: Unrestricted ROM Stability: No instability detected Muscle Tone/Strength: Functionally intact. No obvious neuro-muscular anomalies detected. Sensory (Neurological): Unimpaired Muscle strength & Tone: No palpable anomalies  Lumbar Spine Area Exam  Skin & Axial Inspection: No masses, redness, or swelling Alignment: Symmetrical Functional ROM: Unrestricted ROM       Stability: No instability detected Muscle Tone/Strength: Functionally intact. No obvious neuro-muscular anomalies detected. Sensory (Neurological): Unimpaired Palpation: No palpable anomalies       Provocative Tests:  Hyperextension/rotation test: deferred today       Lumbar quadrant test (Kemp's test): deferred today       Lateral bending test: deferred today       Patrick's Maneuver:  deferred today                   FABER test: deferred today                   S-I anterior distraction/compression test: deferred today         S-I lateral compression test: deferred today         S-I Thigh-thrust test: deferred today         S-I Gaenslen's test: deferred today          Gait & Posture Assessment  Ambulation: Unassisted Gait: Relatively normal for age and body habitus Posture: WNL   Lower Extremity Exam    Side: Right lower extremity  Side: Left lower extremity  Stability: No instability observed          Stability: No instability observed          Skin & Extremity Inspection: Skin color, temperature, and hair growth are WNL. No peripheral edema or cyanosis. No masses, redness, swelling, asymmetry, or associated skin lesions. No contractures.  Skin & Extremity Inspection: Skin color, temperature, and hair growth are WNL. No peripheral edema or cyanosis. No masses, redness, swelling, asymmetry, or associated skin lesions. No contractures.  Functional ROM: Unrestricted ROM                  Functional ROM: Unrestricted ROM                  Muscle Tone/Strength: Functionally intact. No obvious neuro-muscular anomalies detected.  Muscle Tone/Strength: Functionally intact. No obvious neuro-muscular anomalies detected.  Sensory (Neurological): Unimpaired  Sensory (Neurological): Unimpaired  Palpation: No palpable anomalies  Palpation: No palpable anomalies   Assessment  Primary Diagnosis & Pertinent Problem List: The primary encounter diagnosis was Chronic Occipital neuralgia (Left). Diagnoses of Chronic neck pain (Secondary source of pain) (Bilateral) (L>R), Chronic shoulder pain (Tertiary source of pain) (Bilateral) (L>R), Chronic low back pain (Primary Source of Pain) (Bilateral) (R>L), Cervical vertebral fusion (Uncomplicated C5 through C7 ACDF.), DDD (degenerative disc disease), cervical, Fibromyalgia, and Neurogenic pain were also pertinent to this visit.  Status Diagnosis   Controlled Improving Persistent 1. Chronic Occipital neuralgia (Left)   2. Chronic neck pain (Secondary source of pain) (Bilateral) (L>R)   3. Chronic shoulder pain (Tertiary source of pain) (Bilateral) (L>R)   4. Chronic low back pain (Primary Source of Pain) (Bilateral) (R>L)   5. Cervical vertebral fusion (Uncomplicated C5 through C7 ACDF.)   6. DDD (degenerative disc disease), cervical   7. Fibromyalgia   8. Neurogenic pain     Problems updated and reviewed during this visit: No problems updated. Plan of Care  Pharmacotherapy (Medications Ordered): Meds ordered this encounter  Medications  . gabapentin (NEURONTIN) 100 MG capsule    Sig: Take 1-3 capsules (100-300 mg total) by mouth 3 (three) times daily. Follow written titration schedule.    Dispense:  270 capsule    Refill:  0    Do not place medication on "Automatic Refill". Fill one day early if pharmacy is closed on scheduled refill date.   Medications administered today: Jakaylee Spisak had no medications administered during this visit.   Procedure Orders     Cervical Epidural Injection Lab Orders  No  laboratory test(s) ordered today   Imaging Orders  No imaging studies ordered today   Referral Orders  No referral(s) requested today   Interventional management options: Planned, scheduled, and/or pending:   Diagnostic right CESI #1 under fluoro and IV sedation.   Considering:   Palliativeleft sided cervical facet block Diagnostic/palliative bilateral lumbar facet block Possible bilateral lumbar facet radiofrequency ablation Diagnostic/palliative left cervical facet block  Possible left-sided cervical facet radiofrequency ablation   Palliative PRN treatment(s):   Diagnostic/palliative bilateral lumbar facet block Diagnostic/palliative left cervical facet block  Palliativeleft sided cervical facet block    Provider-requested follow-up: Return for Procedure (w/ sedation): (R) CESI #1.  Future Appointments   Date Time Provider [place]  09/14/2018  9:15 AM Vevelyn Francois, NP [facility] None   Primary Care Physician: Sharyne Peach, MD Location: [facility] Facility Note by: Gaspar Cola, MD Date: 07/26/2018; Time: 2:45 PM

## 2018-07-26 ENCOUNTER — Other Ambulatory Visit: Payer: Self-pay

## 2018-07-26 ENCOUNTER — Ambulatory Visit: Payer: Medicare Other | Attending: Pain Medicine | Admitting: Pain Medicine

## 2018-07-26 ENCOUNTER — Encounter: Payer: Self-pay | Admitting: Pain Medicine

## 2018-07-26 VITALS — BP 117/70 | HR 68 | Temp 98.2°F | Ht 63.0 in | Wt 224.0 lb

## 2018-07-26 DIAGNOSIS — D860 Sarcoidosis of lung: Secondary | ICD-10-CM | POA: Insufficient documentation

## 2018-07-26 DIAGNOSIS — M5030 Other cervical disc degeneration, unspecified cervical region: Secondary | ICD-10-CM | POA: Diagnosis not present

## 2018-07-26 DIAGNOSIS — F514 Sleep terrors [night terrors]: Secondary | ICD-10-CM | POA: Insufficient documentation

## 2018-07-26 DIAGNOSIS — Z833 Family history of diabetes mellitus: Secondary | ICD-10-CM | POA: Insufficient documentation

## 2018-07-26 DIAGNOSIS — N3281 Overactive bladder: Secondary | ICD-10-CM | POA: Insufficient documentation

## 2018-07-26 DIAGNOSIS — M792 Neuralgia and neuritis, unspecified: Secondary | ICD-10-CM

## 2018-07-26 DIAGNOSIS — M5481 Occipital neuralgia: Secondary | ICD-10-CM | POA: Diagnosis not present

## 2018-07-26 DIAGNOSIS — Z87891 Personal history of nicotine dependence: Secondary | ICD-10-CM | POA: Insufficient documentation

## 2018-07-26 DIAGNOSIS — Z811 Family history of alcohol abuse and dependence: Secondary | ICD-10-CM | POA: Insufficient documentation

## 2018-07-26 DIAGNOSIS — L409 Psoriasis, unspecified: Secondary | ICD-10-CM | POA: Diagnosis not present

## 2018-07-26 DIAGNOSIS — I130 Hypertensive heart and chronic kidney disease with heart failure and stage 1 through stage 4 chronic kidney disease, or unspecified chronic kidney disease: Secondary | ICD-10-CM | POA: Insufficient documentation

## 2018-07-26 DIAGNOSIS — D696 Thrombocytopenia, unspecified: Secondary | ICD-10-CM | POA: Diagnosis not present

## 2018-07-26 DIAGNOSIS — N183 Chronic kidney disease, stage 3 (moderate): Secondary | ICD-10-CM | POA: Insufficient documentation

## 2018-07-26 DIAGNOSIS — G894 Chronic pain syndrome: Secondary | ICD-10-CM | POA: Insufficient documentation

## 2018-07-26 DIAGNOSIS — E1122 Type 2 diabetes mellitus with diabetic chronic kidney disease: Secondary | ICD-10-CM | POA: Insufficient documentation

## 2018-07-26 DIAGNOSIS — I5032 Chronic diastolic (congestive) heart failure: Secondary | ICD-10-CM | POA: Insufficient documentation

## 2018-07-26 DIAGNOSIS — Z886 Allergy status to analgesic agent status: Secondary | ICD-10-CM | POA: Insufficient documentation

## 2018-07-26 DIAGNOSIS — H8110 Benign paroxysmal vertigo, unspecified ear: Secondary | ICD-10-CM | POA: Insufficient documentation

## 2018-07-26 DIAGNOSIS — E785 Hyperlipidemia, unspecified: Secondary | ICD-10-CM | POA: Diagnosis not present

## 2018-07-26 DIAGNOSIS — Z6839 Body mass index (BMI) 39.0-39.9, adult: Secondary | ICD-10-CM | POA: Diagnosis not present

## 2018-07-26 DIAGNOSIS — Z981 Arthrodesis status: Secondary | ICD-10-CM | POA: Insufficient documentation

## 2018-07-26 DIAGNOSIS — Z79891 Long term (current) use of opiate analgesic: Secondary | ICD-10-CM | POA: Diagnosis not present

## 2018-07-26 DIAGNOSIS — K219 Gastro-esophageal reflux disease without esophagitis: Secondary | ICD-10-CM | POA: Diagnosis not present

## 2018-07-26 DIAGNOSIS — E559 Vitamin D deficiency, unspecified: Secondary | ICD-10-CM | POA: Insufficient documentation

## 2018-07-26 DIAGNOSIS — J449 Chronic obstructive pulmonary disease, unspecified: Secondary | ICD-10-CM | POA: Diagnosis not present

## 2018-07-26 DIAGNOSIS — M810 Age-related osteoporosis without current pathological fracture: Secondary | ICD-10-CM | POA: Diagnosis not present

## 2018-07-26 DIAGNOSIS — M797 Fibromyalgia: Secondary | ICD-10-CM | POA: Diagnosis not present

## 2018-07-26 DIAGNOSIS — G8929 Other chronic pain: Secondary | ICD-10-CM

## 2018-07-26 DIAGNOSIS — M25511 Pain in right shoulder: Secondary | ICD-10-CM | POA: Diagnosis not present

## 2018-07-26 DIAGNOSIS — E1142 Type 2 diabetes mellitus with diabetic polyneuropathy: Secondary | ICD-10-CM | POA: Insufficient documentation

## 2018-07-26 DIAGNOSIS — M6281 Muscle weakness (generalized): Secondary | ICD-10-CM | POA: Diagnosis not present

## 2018-07-26 DIAGNOSIS — Z820 Family history of epilepsy and other diseases of the nervous system: Secondary | ICD-10-CM | POA: Insufficient documentation

## 2018-07-26 DIAGNOSIS — Z881 Allergy status to other antibiotic agents status: Secondary | ICD-10-CM | POA: Insufficient documentation

## 2018-07-26 DIAGNOSIS — N281 Cyst of kidney, acquired: Secondary | ICD-10-CM | POA: Diagnosis not present

## 2018-07-26 DIAGNOSIS — M4312 Spondylolisthesis, cervical region: Secondary | ICD-10-CM | POA: Insufficient documentation

## 2018-07-26 DIAGNOSIS — M545 Low back pain: Secondary | ICD-10-CM | POA: Diagnosis not present

## 2018-07-26 DIAGNOSIS — M4322 Fusion of spine, cervical region: Secondary | ICD-10-CM

## 2018-07-26 DIAGNOSIS — N3946 Mixed incontinence: Secondary | ICD-10-CM | POA: Insufficient documentation

## 2018-07-26 DIAGNOSIS — M542 Cervicalgia: Secondary | ICD-10-CM

## 2018-07-26 DIAGNOSIS — M25512 Pain in left shoulder: Secondary | ICD-10-CM

## 2018-07-26 DIAGNOSIS — Z8249 Family history of ischemic heart disease and other diseases of the circulatory system: Secondary | ICD-10-CM | POA: Insufficient documentation

## 2018-07-26 DIAGNOSIS — Z836 Family history of other diseases of the respiratory system: Secondary | ICD-10-CM | POA: Insufficient documentation

## 2018-07-26 DIAGNOSIS — M064 Inflammatory polyarthropathy: Secondary | ICD-10-CM | POA: Insufficient documentation

## 2018-07-26 DIAGNOSIS — M549 Dorsalgia, unspecified: Secondary | ICD-10-CM

## 2018-07-26 MED ORDER — GABAPENTIN 100 MG PO CAPS: 100.0000 mg | ORAL_CAPSULE | Freq: Three times a day (TID) | ORAL | 0 refills | Status: DC

## 2018-07-26 NOTE — Patient Instructions (Addendum)
____________________________________________________________________________________________  Preparing for Procedure with Sedation  Instructions: . Oral Intake: Do not eat or drink anything for at least 8 hours prior to your procedure. . Transportation: Public transportation is not allowed. Bring an adult driver. The driver must be physically present in our waiting room before any procedure can be started. . Physical Assistance: Bring an adult physically capable of assisting you, in the event you need help. This adult should keep you company at home for at least 6 hours after the procedure. . Blood Pressure Medicine: Take your blood pressure medicine with a sip of water the morning of the procedure. . Blood thinners: Notify our staff if you are taking any blood thinners. Depending on which one you take, there will be specific instructions on how and when to stop it. . Diabetics on insulin: Notify the staff so that you can be scheduled 1st case in the morning. If your diabetes requires high dose insulin, take only  of your normal insulin dose the morning of the procedure and notify the staff that you have done so. . Preventing infections: Shower with an antibacterial soap the morning of your procedure. . Build-up your immune system: Take 1000 mg of Vitamin C with every meal (3 times a day) the day prior to your procedure. . Antibiotics: Inform the staff if you have a condition or reason that requires you to take antibiotics before dental procedures. . Pregnancy: If you are pregnant, call and cancel the procedure. . Sickness: If you have a cold, fever, or any active infections, call and cancel the procedure. . Arrival: You must be in the facility at least 30 minutes prior to your scheduled procedure. . Children: Do not bring children with you. . Dress appropriately: Bring dark clothing that you would not mind if they get stained. . Valuables: Do not bring any jewelry or valuables.  Procedure  appointments are reserved for interventional treatments only. . No Prescription Refills. . No medication changes will be discussed during procedure appointments. . No disability issues will be discussed.  Reasons to call and reschedule or cancel your procedure: (Following these recommendations will minimize the risk of a serious complication.) . Surgeries: Avoid having procedures within 2 weeks of any surgery. (Avoid for 2 weeks before or after any surgery). . Flu Shots: Avoid having procedures within 2 weeks of a flu shots or . (Avoid for 2 weeks before or after immunizations). . Barium: Avoid having a procedure within 7-10 days after having had a radiological study involving the use of radiological contrast. (Myelograms, Barium swallow or enema study). . Heart attacks: Avoid any elective procedures or surgeries for the initial 6 months after a "Myocardial Infarction" (Heart Attack). . Blood thinners: It is imperative that you stop these medications before procedures. Let us know if you if you take any blood thinner.  . Infection: Avoid procedures during or within two weeks of an infection (including chest colds or gastrointestinal problems). Symptoms associated with infections include: Localized redness, fever, chills, night sweats or profuse sweating, burning sensation when voiding, cough, congestion, stuffiness, runny nose, sore throat, diarrhea, nausea, vomiting, cold or Flu symptoms, recent or current infections. It is specially important if the infection is over the area that we intend to treat. . Heart and lung problems: Symptoms that may suggest an active cardiopulmonary problem include: cough, chest pain, breathing difficulties or shortness of breath, dizziness, ankle swelling, uncontrolled high or unusually low blood pressure, and/or palpitations. If you are experiencing any of these symptoms, cancel   your procedure and contact your primary care physician for an evaluation.  Remember:   Regular Business hours are:  Monday to Thursday 8:00 AM to 4:00 PM  Provider's Schedule: Delano Metz, MD:  Procedure days: Tuesday and Thursday 7:30 AM to 4:00 PM  Edward Jolly, MD:  Procedure days: Monday and Wednesday 7:30 AM to 4:00 PM ____________________________________________________________________________________________  ____________________________________________________________________________________________  Gabapentin Titration  Medication used: Gabapentin (Generic Name) or Neurontin (Brand Name) 100 mg tablets/capsules  Reasons to stop increasing the dose:  Reason 1: You get good relief of symptoms, in which case there is no need to increase the daily dose any further.    Reason 2: You develop some side effects, such as sleeping all of the time, difficulty concentrating, or becoming disoriented, in which case you need to go down on the dose, to the prior level, where you were not experiencing any side effects. Stay on that dose longer, to allow more time for your body to get use it, before attempting to increase it again.   Steps to increase medication: Step 1: Start by taking 1 (one) tablet at bedtime x 7 (seven) days.  Step 2: Increase dose to 2 (two) tablets at bedtime. Stay on this dose x 7 (seven) days.  Step 3: Next increase it to 3 (three) tablets at bedtime. Stay on this dose x another 7 (seven) days.  Step 4: Next, add 1 (one) tablet at noon with lunch. Continue this dose x another 7 (seven) days.  Step 5: Add 1 (one) tablet in the afternoon with dinner. Stay on this dose x another 7 (seven) days.  Step 6: At this point you should be taking the medicine 4 (four) times a day. This daily regimen of taking the medicine 4 (four) times a day, will be maintained from now on. You should not take any doses any sooner than every 6 (six) hours.  Step 7: After 7 (seven) days of taking 3 (three) tablet at bedtime, 1 (one) tablet at noon, 1 (one) tablet in the  afternoon, and 1 (one) tablet in the morning, begin taking 2 (two) tablets at noon with lunch. Stay on this dose x another 7 (seven) days.   Step 8: After 7 (seven) days of taking 3 (three) tablet at bedtime, 2 (two) tablets at noon, 1 (one) tablet in the afternoon, and 1 (one) tablet in the morning, begin taking 2 (two) tablets in the afternoon with dinner. Stay on this dose x another 7 (seven) days.   Step 9: After 7 (seven) days of taking 3 (three) tablet at bedtime, 2 (two) tablets at noon, 2 (two) tablets in the afternoon, and 1 (one) tablet in the morning, begin taking 2 (two) tablets in the morning with breakfast. Stay on this dose x another 7 (seven) days. At this point you should be taking the medicine 4 (four) times a day, or about every 6 (six) hours. This daily regimen of taking the medicine 4 (four) times a day, will be maintained from now on. You should not take any doses any sooner than every 6 (six) hours.  Step 10: After 7 (seven) days of taking 3 (three) tablet at bedtime, 2 (two) tablets at noon, 2 (two) tablets in the afternoon, and 2 (two) tablets in the morning, begin taking 3 (three) tablets at noon with lunch. Stay on this dose x another 7 (seven) days.   Step 11: After 7 (seven) days of taking 3 (three) tablet at bedtime, 3 (three) tablets at  noon, 2 (two) tablets in the afternoon, and 2 (two) tablets in the morning, begin taking 3 (three) tablets in the afternoon with dinner. Stay on this dose x another 7 (seven) days.   Step 12: After 7 (seven) days of taking 3 (three) tablet at bedtime, 3 (three) tablets at noon, 3 (three) tablets in the afternoon, and 2 (two) tablet in the morning, begin taking 3 (three) tablets in the morning with breakfast. Stay on this dose x another 7 (seven) days. At this point you should be taking the medicine 4 (four) times a day, or about every 6 (six) hours. This daily regimen of taking the medicine 4 (four) times a day, will be maintained from now on.    Endpoint: Once you have reached the maximum dose you can tolerate without side-effects, contact your physician so as to evaluate the results of the regimen.   Questions: Feel free to contact us for any questions or problems at [phone] ____________________________________________________________________________________________ Pain Management Discharge Instructions  General Discharge Instructions :  If you need to reach your doctor call: Monday-Friday 8:00 am - 4:00 pm at [phone] or toll free [phone].  After clinic hours [phone] to have operator reach doctor.  Bring all of your medication bottles to all your appointments in the pain clinic.  To cancel or reschedule your appointment with Pain Management please remember to call 24 hours in advance to avoid a fee.  Refer to the educational materials which you have been given on: General Risks, I had my Procedure. Discharge Instructions, Post Sedation.  Post Procedure Instructions:  The drugs you were given will stay in your system until tomorrow, so for the next 24 hours you should not drive, make any legal decisions or drink any alcoholic beverages.  You may eat anything you prefer, but it is better to start with liquids then soups and crackers, and gradually work up to solid foods.  Please notify your doctor immediately if you have any unusual bleeding, trouble breathing or pain that is not related to your normal pain.  Depending on the type of procedure that was done, some parts of your body may feel week and/or numb.  This usually clears up by tonight or the next day.  Walk with the use of an assistive device or accompanied by an adult for the 24 hours.  You may use ice on the affected area for the first 24 hours.  Put ice in a Ziploc bag and cover with a towel and place against area 15 minutes on 15 minutes off.  You may switch to heat after 24 hours.GENERAL RISKS AND COMPLICATIONS  What are the risk, side  effects and possible complications? Generally speaking, most procedures are safe.  However, with any procedure there are risks, side effects, and the possibility of complications.  The risks and complications are dependent upon the sites that are lesioned, or the type of nerve block to be performed.  The closer the procedure is to the spine, the more serious the risks are.  Great care is taken when placing the radio frequency needles, block needles or lesioning probes, but sometimes complications can occur. 1. Infection: Any time there is an injection through the skin, there is a risk of infection.  This is why sterile conditions are used for these blocks.  There are four possible types of infection. 1. Localized skin infection. 2. Central Nervous System Infection-This can be in the form of Meningitis, which can be deadly. 3. Epidural Infections-This can  be in the form of an epidural abscess, which can cause pressure inside of the spine, causing compression of the spinal cord with subsequent paralysis. This would require an emergency surgery to decompress, and there are no guarantees that the patient would recover from the paralysis. 4. Discitis-This is an infection of the intervertebral discs.  It occurs in about 1% of discography procedures.  It is difficult to treat and it may lead to surgery.        2. Pain: the needles have to go through skin and soft tissues, will cause soreness.       3. Damage to internal structures:  The nerves to be lesioned may be near blood vessels or    other nerves which can be potentially damaged.       4. Bleeding: Bleeding is more common if the patient is taking blood thinners such as  aspirin, Coumadin, Ticiid, Plavix, etc., or if he/she have some genetic predisposition  such as hemophilia. Bleeding into the spinal canal can cause compression of the spinal  cord with subsequent paralysis.  This would require an emergency surgery to  decompress and there are no guarantees  that the patient would recover from the  paralysis.       5. Pneumothorax:  Puncturing of a lung is a possibility, every time a needle is introduced in  the area of the chest or upper back.  Pneumothorax refers to free air around the  collapsed lung(s), inside of the thoracic cavity (chest cavity).  Another two possible  complications related to a similar event would include: Hemothorax and Chylothorax.   These are variations of the Pneumothorax, where instead of air around the collapsed  lung(s), you may have blood or chyle, respectively.       6. Spinal headaches: They may occur with any procedures in the area of the spine.       7. Persistent CSF (Cerebro-Spinal Fluid) leakage: This is a rare problem, but may occur  with prolonged intrathecal or epidural catheters either due to the formation of a fistulous  track or a dural tear.       8. Nerve damage: By working so close to the spinal cord, there is always a possibility of  nerve damage, which could be as serious as a permanent spinal cord injury with  paralysis.       9. Death:  Although rare, severe deadly allergic reactions known as "Anaphylactic  reaction" can occur to any of the medications used.      10. Worsening of the symptoms:  We can always make thing worse.  What are the chances of something like this happening? Chances of any of this occuring are extremely low.  By statistics, you have more of a chance of getting killed in a motor vehicle accident: while driving to the hospital than any of the above occurring .  Nevertheless, you should be aware that they are possibilities.  In general, it is similar to taking a shower.  Everybody knows that you can slip, hit your head and get killed.  Does that mean that you should not shower again?  Nevertheless always keep in mind that statistics do not mean anything if you happen to be on the wrong side of them.  Even if a procedure has a 1 (one) in a 1,000,000 (million) chance of going wrong, it you  happen to be that one..Also, keep in mind that by statistics, you have more of a chance of having  something go wrong when taking medications.  Who should not have this procedure? If you are on a blood thinning medication (e.g. Coumadin, Plavix, see list of "Blood Thinners"), or if you have an active infection going on, you should not have the procedure.  If you are taking any blood thinners, please inform your physician.  How should I prepare for this procedure?  Do not eat or drink anything at least six hours prior to the procedure.  Bring a driver with you .  It cannot be a taxi.  Come accompanied by an adult that can drive you back, and that is strong enough to help you if your legs get weak or numb from the local anesthetic.  Take all of your medicines the morning of the procedure with just enough water to swallow them.  If you have diabetes, make sure that you are scheduled to have your procedure done first thing in the morning, whenever possible.  If you have diabetes, take only half of your insulin dose and notify our nurse that you have done so as soon as you arrive at the clinic.  If you are diabetic, but only take blood sugar pills (oral hypoglycemic), then do not take them on the morning of your procedure.  You may take them after you have had the procedure.  Do not take aspirin or any aspirin-containing medications, at least eleven (11) days prior to the procedure.  They may prolong bleeding.  Wear loose fitting clothing that may be easy to take off and that you would not mind if it got stained with Betadine or blood.  Do not wear any jewelry or perfume  Remove any nail coloring.  It will interfere with some of our monitoring equipment.  NOTE: Remember that this is not meant to be interpreted as a complete list of all possible complications.  Unforeseen problems may occur.  BLOOD THINNERS The following drugs contain aspirin or other products, which can cause increased  bleeding during surgery and should not be taken for 2 weeks prior to and 1 week after surgery.  If you should need take something for relief of minor pain, you may take acetaminophen which is found in Tylenol,m Datril, Anacin-3 and Panadol. It is not blood thinner. The products listed below are.  Do not take any of the products listed below in addition to any listed on your instruction sheet.  A.P.C or A.P.C with Codeine Codeine Phosphate Capsules #3 Ibuprofen Ridaura  ABC compound Congesprin Imuran rimadil  Advil Cope Indocin Robaxisal  Alka-Seltzer Effervescent Pain Reliever and Antacid Coricidin or Coricidin-D  Indomethacin Rufen  Alka-Seltzer plus Cold Medicine Cosprin Ketoprofen S-A-C Tablets  Anacin Analgesic Tablets or Capsules Coumadin Korlgesic Salflex  Anacin Extra Strength Analgesic tablets or capsules CP-2 Tablets Lanoril Salicylate  Anaprox Cuprimine Capsules Levenox Salocol  Anexsia-D Dalteparin Magan Salsalate  Anodynos Darvon compound Magnesium Salicylate Sine-off  Ansaid Dasin Capsules Magsal Sodium Salicylate  Anturane Depen Capsules Marnal Soma  APF Arthritis pain formula Dewitt's Pills Measurin Stanback  Argesic Dia-Gesic Meclofenamic Sulfinpyrazone  Arthritis Bayer Timed Release Aspirin Diclofenac Meclomen Sulindac  Arthritis pain formula Anacin Dicumarol Medipren Supac  Analgesic (Safety coated) Arthralgen Diffunasal Mefanamic Suprofen  Arthritis Strength Bufferin Dihydrocodeine Mepro Compound Suprol  Arthropan liquid Dopirydamole Methcarbomol with Aspirin Synalgos  ASA tablets/Enseals Disalcid Micrainin Tagament  Ascriptin Doan's Midol Talwin  Ascriptin A/D Dolene Mobidin Tanderil  Ascriptin Extra Strength Dolobid Moblgesic Ticlid  Ascriptin with Codeine Doloprin or Doloprin with Codeine Momentum Tolectin  Asperbuf  Duoprin Mono-gesic Trendar  Aspergum Duradyne Motrin or Motrin IB Triminicin  Aspirin plain, buffered or enteric coated Durasal Myochrisine Trigesic   Aspirin Suppositories Easprin Nalfon Trillsate  Aspirin with Codeine Ecotrin Regular or Extra Strength Naprosyn Uracel  Atromid-S Efficin Naproxen Ursinus  Auranofin Capsules Elmiron Neocylate Vanquish  Axotal Emagrin Norgesic Verin  Azathioprine Empirin or Empirin with Codeine Normiflo Vitamin E  Azolid Emprazil Nuprin Voltaren  Bayer Aspirin plain, buffered or children's or timed BC Tablets or powders Encaprin Orgaran Warfarin Sodium  Buff-a-Comp Enoxaparin Orudis Zorpin  Buff-a-Comp with Codeine Equegesic Os-Cal-Gesic   Buffaprin Excedrin plain, buffered or Extra Strength Oxalid   Bufferin Arthritis Strength Feldene Oxphenbutazone   Bufferin plain or Extra Strength Feldene Capsules Oxycodone with Aspirin   Bufferin with Codeine Fenoprofen Fenoprofen Pabalate or Pabalate-SF   Buffets II Flogesic Panagesic   Buffinol plain or Extra Strength Florinal or Florinal with Codeine Panwarfarin   Buf-Tabs Flurbiprofen Penicillamine   Butalbital Compound Four-way cold tablets Penicillin   Butazolidin Fragmin Pepto-Bismol   Carbenicillin Geminisyn Percodan   Carna Arthritis Reliever Geopen Persantine   Carprofen Gold's salt Persistin   Chloramphenicol Goody's Phenylbutazone   Chloromycetin Haltrain Piroxlcam   Clmetidine heparin Plaquenil   Cllnoril Hyco-pap Ponstel   Clofibrate Hydroxy chloroquine Propoxyphen         Before stopping any of these medications, be sure to consult the physician who ordered them.  Some, such as Coumadin (Warfarin) are ordered to prevent or treat serious conditions such as "deep thrombosis", "pumonary embolisms", and other heart problems.  The amount of time that you may need off of the medication may also vary with the medication and the reason for which you were taking it.  If you are taking any of these medications, please make sure you notify your pain physician before you undergo any procedures.         Epidural Steroid Injection Patient  Information  Description: The epidural space surrounds the nerves as they exit the spinal cord.  In some patients, the nerves can be compressed and inflamed by a bulging disc or a tight spinal canal (spinal stenosis).  By injecting steroids into the epidural space, we can bring irritated nerves into direct contact with a potentially helpful medication.  These steroids act directly on the irritated nerves and can reduce swelling and inflammation which often leads to decreased pain.  Epidural steroids may be injected anywhere along the spine and from the neck to the low back depending upon the location of your pain.   After numbing the skin with local anesthetic (like Novocaine), a small needle is passed into the epidural space slowly.  You may experience a sensation of pressure while this is being done.  The entire block usually last less than 10 minutes.  Conditions which may be treated by epidural steroids:   Low back and leg pain  Neck and arm pain  Spinal stenosis  Post-laminectomy syndrome  Herpes zoster (shingles) pain  Pain from compression fractures  Preparation for the injection:  1. Do not eat any solid food or dairy products within 8 hours of your appointment.  2. You may drink clear liquids up to 3 hours before appointment.  Clear liquids include water, black coffee, juice or soda.  No milk or cream please. 3. You may take your regular medication, including pain medications, with a sip of water before your appointment  Diabetics should hold regular insulin (if taken separately) and take 1/2 normal NPH dos the morning of  the procedure.  Carry some sugar containing items with you to your appointment. 4. A driver must accompany you and be prepared to drive you home after your procedure.  5. Bring all your current medications with your. 6. An IV may be inserted and sedation may be given at the discretion of the physician.   7. A blood pressure cuff, EKG and other monitors will  often be applied during the procedure.  Some patients may need to have extra oxygen administered for a short period. 8. You will be asked to provide medical information, including your allergies, prior to the procedure.  We must know immediately if you are taking blood thinners (like Coumadin/Warfarin)  Or if you are allergic to IV iodine contrast (dye). We must know if you could possible be pregnant.  Possible side-effects:  Bleeding from needle site  Infection (rare, may require surgery)  Nerve injury (rare)  Numbness & tingling (temporary)  Difficulty urinating (rare, temporary)  Spinal headache ( a headache worse with upright posture)  Light -headedness (temporary)  Pain at injection site (several days)  Decreased blood pressure (temporary)  Weakness in arm/leg (temporary)  Pressure sensation in back/neck (temporary)  Call if you experience:  Fever/chills associated with headache or increased back/neck pain.  Headache worsened by an upright position.  New onset weakness or numbness of an extremity below the injection site  Hives or difficulty breathing (go to the emergency room)  Inflammation or drainage at the infection site  Severe back/neck pain  Any new symptoms which are concerning to you  Please note:  Although the local anesthetic injected can often make your back or neck feel good for several hours after the injection, the pain will likely return.  It takes 3-7 days for steroids to work in the epidural space.  You may not notice any pain relief for at least that one week.  If effective, we will often do a series of three injections spaced 3-6 weeks apart to maximally decrease your pain.  After the initial series, we generally will wait several months before considering a repeat injection of the same type.  If you have any questions, please call [phone] [facility]

## 2018-08-03 ENCOUNTER — Ambulatory Visit (HOSPITAL_BASED_OUTPATIENT_CLINIC_OR_DEPARTMENT_OTHER): Payer: Medicare Other | Admitting: Pain Medicine

## 2018-08-03 ENCOUNTER — Ambulatory Visit
Admission: RE | Admit: 2018-08-03 | Discharge: 2018-08-03 | Disposition: A | Discharge status: Home / Self Care | Payer: Medicare Other | Source: Ambulatory Visit | Attending: Pain Medicine | Admitting: Pain Medicine

## 2018-08-03 ENCOUNTER — Encounter: Payer: Self-pay | Admitting: Pain Medicine

## 2018-08-03 VITALS — BP 105/57 | HR 62 | Temp 97.1°F | Resp 15 | Ht 63.5 in | Wt 222.0 lb

## 2018-08-03 DIAGNOSIS — M5030 Other cervical disc degeneration, unspecified cervical region: Secondary | ICD-10-CM

## 2018-08-03 DIAGNOSIS — M542 Cervicalgia: Secondary | ICD-10-CM

## 2018-08-03 DIAGNOSIS — R55 Syncope and collapse: Secondary | ICD-10-CM | POA: Diagnosis not present

## 2018-08-03 DIAGNOSIS — M4312 Spondylolisthesis, cervical region: Secondary | ICD-10-CM | POA: Diagnosis not present

## 2018-08-03 DIAGNOSIS — Z981 Arthrodesis status: Secondary | ICD-10-CM | POA: Insufficient documentation

## 2018-08-03 DIAGNOSIS — M4322 Fusion of spine, cervical region: Secondary | ICD-10-CM

## 2018-08-03 DIAGNOSIS — M47812 Spondylosis without myelopathy or radiculopathy, cervical region: Secondary | ICD-10-CM | POA: Insufficient documentation

## 2018-08-03 DIAGNOSIS — Z886 Allergy status to analgesic agent status: Secondary | ICD-10-CM | POA: Diagnosis not present

## 2018-08-03 DIAGNOSIS — M4692 Unspecified inflammatory spondylopathy, cervical region: Secondary | ICD-10-CM

## 2018-08-03 DIAGNOSIS — Z881 Allergy status to other antibiotic agents status: Secondary | ICD-10-CM | POA: Insufficient documentation

## 2018-08-03 DIAGNOSIS — M5412 Radiculopathy, cervical region: Secondary | ICD-10-CM

## 2018-08-03 MED ORDER — DEXAMETHASONE SODIUM PHOSPHATE 10 MG/ML IJ SOLN
10.0000 mg | Freq: Once | INTRAMUSCULAR | Status: AC
  Administered 2018-08-03: 10 mg
  Filled 2018-08-03: qty 1

## 2018-08-03 MED ORDER — MIDAZOLAM HCL 5 MG/5ML IJ SOLN
1.0000 mg | INTRAMUSCULAR | Status: DC | PRN
  Administered 2018-08-03: 2 mg via INTRAVENOUS
  Filled 2018-08-03: qty 5

## 2018-08-03 MED ORDER — IOPAMIDOL (ISOVUE-M 200) INJECTION 41%
10.0000 mL | Freq: Once | INTRAMUSCULAR | Status: AC
  Administered 2018-08-03: 10 mL via EPIDURAL
  Filled 2018-08-03: qty 10

## 2018-08-03 MED ORDER — ROPIVACAINE HCL 2 MG/ML IJ SOLN
1.0000 mL | Freq: Once | INTRAMUSCULAR | Status: AC
  Administered 2018-08-03: 10 mL via EPIDURAL
  Filled 2018-08-03: qty 10

## 2018-08-03 MED ORDER — LIDOCAINE HCL 2 % IJ SOLN
20.0000 mL | Freq: Once | INTRAMUSCULAR | Status: AC
  Administered 2018-08-03: 400 mg
  Filled 2018-08-03: qty 20

## 2018-08-03 MED ORDER — FENTANYL CITRATE (PF) 100 MCG/2ML IJ SOLN
25.0000 ug | INTRAMUSCULAR | Status: DC | PRN
  Administered 2018-08-03: 50 ug via INTRAVENOUS
  Filled 2018-08-03: qty 2

## 2018-08-03 MED ORDER — SODIUM CHLORIDE 0.9 % IJ SOLN
INTRAMUSCULAR | Status: AC
  Filled 2018-08-03: qty 10

## 2018-08-03 MED ORDER — LACTATED RINGERS IV SOLN
1000.0000 mL | Freq: Once | INTRAVENOUS | Status: AC
  Administered 2018-08-03: 1000 mL via INTRAVENOUS

## 2018-08-03 MED ORDER — SODIUM CHLORIDE 0.9% FLUSH
1.0000 mL | Freq: Once | INTRAVENOUS | Status: AC
  Administered 2018-08-03: 10 mL

## 2018-08-03 MED ORDER — GLYCOPYRROLATE 0.2 MG/ML IJ SOLN: 0.2000 mg | Freq: Once | INTRAMUSCULAR | Status: DC

## 2018-08-03 NOTE — Progress Notes (Signed)
Patient's Name: Amber Acosta  MRN: [id number]  Referring Provider: Rayetta Humphrey, MD  DOB: 10/11/1951  PCP: Rayetta Humphrey, MD  DOS: 08/03/2018  Note by: Oswaldo Done, MD  Service setting: Ambulatory outpatient  Specialty: Interventional Pain Management  Patient type: Established  Location: [facility] (AMB) Pain Management Facility  Visit type: Interventional Procedure   Primary Reason for Visit: Interventional Pain Management Treatment. CC: Neck Pain (right)  Procedure:          Anesthesia, Analgesia, Anxiolysis:  Type: Diagnostic, Inter-Laminar, Epidural Steroid Injection #1  Region: Posterior Cervico-thoracic Region Level: C7-T1 Laterality: Right-Sided Paramedial  Type: Moderate (Conscious) Sedation combined with Local Anesthesia Indication(s): Analgesia and Anxiety Route: Intravenous (IV) IV Access: Secured Sedation: Meaningful verbal contact was maintained at all times during the procedure  Local Anesthetic: Lidocaine 1-2%   Indications: 1. DDD (degenerative disc disease), cervical   2. Cervical spondylosis   3. Cervical spondylitis with radiculitis (HCC)   4. Cervical vertebral fusion (Uncomplicated C5 through C7 ACDF.)   5. Cervicalgia   6. Spondylolisthesis of cervical region   7. Vasovagal episode    Pain Score: Pre-procedure: 2 /10 Post-procedure: 0-No pain/10  Pre-op Assessment:  Ms. Cowell is a 67 y.o. (year old), female patient, seen today for interventional treatment. She  has a past surgical history that includes neck fusion; Breast surgery; Back surgery; Knee arthroscopy; Fracture surgery; Hand surgery; and Abdominal hysterectomy. Ms. Mahmood has a current medication list which includes the following prescription(s): advair diskus, albuterol, amlodipine, b-d ins syr ultrafine 1cc/30g, b-d ins syr ultrafine 1cc/31g, fluoxetine, furosemide, gabapentin, klor-con 10, losartan, lovastatin, metoprolol succinate, nitroglycerin, omeprazole, oxycodone, oxycodone, oxycodone,  ranitidine, tizanidine, trazodone, hydralazine, and insulin nph human, and the following Facility-Administered Medications: fentanyl, glycopyrrolate, and midazolam. Her primarily concern today is the Neck Pain (right)  Initial Vital Signs:  Pulse/HCG Rate: 68ECG Heart Rate: 69 Temp: 97.9 F (36.6 C) Resp: 16 BP: (!) 145/68 SpO2: 98 %  BMI: Estimated body mass index is 38.71 kg/m as calculated from the following:   Height as of this encounter: 5' 3.5" (1.613 m).   Weight as of this encounter: 222 lb (100.7 kg).  Risk Assessment: Allergies: Reviewed. She is allergic to aspirin; ciprofloxacin; etanercept; metformin; methotrexate; mushroom extract complex; other; darvon [propoxyphene]; minocycline; and cobalamine combinations.  Allergy Precautions: None required Coagulopathies: Reviewed. None identified.  Blood-thinner therapy: None at this time Active Infection(s): Reviewed. None identified. Ms. Twichell is afebrile  Site Confirmation: Ms. Swing was asked to confirm the procedure and laterality before marking the site Procedure checklist: Completed Consent: Before the procedure and under the influence of no sedative(s), amnesic(s), or anxiolytics, the patient was informed of the treatment options, risks and possible complications. To fulfill our ethical and legal obligations, as recommended by the American Medical Association's Code of Ethics, I have informed the patient of my clinical impression; the nature and purpose of the treatment or procedure; the risks, benefits, and possible complications of the intervention; the alternatives, including doing nothing; the risk(s) and benefit(s) of the alternative treatment(s) or procedure(s); and the risk(s) and benefit(s) of doing nothing. The patient was provided information about the general risks and possible complications associated with the procedure. These may include, but are not limited to: failure to achieve desired goals, infection, bleeding,  organ or nerve damage, allergic reactions, paralysis, and death. In addition, the patient was informed of those risks and complications associated to Spine-related procedures, such as failure to decrease pain; infection (i.e.: Meningitis, epidural or  intraspinal abscess); bleeding (i.e.: epidural hematoma, subarachnoid hemorrhage, or any other type of intraspinal or peri-dural bleeding); organ or nerve damage (i.e.: Any type of peripheral nerve, nerve root, or spinal cord injury) with subsequent damage to sensory, motor, and/or autonomic systems, resulting in permanent pain, numbness, and/or weakness of one or several areas of the body; allergic reactions; (i.e.: anaphylactic reaction); and/or death. Furthermore, the patient was informed of those risks and complications associated with the medications. These include, but are not limited to: allergic reactions (i.e.: anaphylactic or anaphylactoid reaction(s)); adrenal axis suppression; blood sugar elevation that in diabetics may result in ketoacidosis or comma; water retention that in patients with history of congestive heart failure may result in shortness of breath, pulmonary edema, and decompensation with resultant heart failure; weight gain; swelling or edema; medication-induced neural toxicity; particulate matter embolism and blood vessel occlusion with resultant organ, and/or nervous system infarction; and/or aseptic necrosis of one or more joints. Finally, the patient was informed that Medicine is not an exact science; therefore, there is also the possibility of unforeseen or unpredictable risks and/or possible complications that may result in a catastrophic outcome. The patient indicated having understood very clearly. We have given the patient no guarantees and we have made no promises. Enough time was given to the patient to ask questions, all of which were answered to the patient's satisfaction. Ms. Cullen has indicated that she wanted to continue with  the procedure. Attestation: I, the ordering provider, attest that I have discussed with the patient the benefits, risks, side-effects, alternatives, likelihood of achieving goals, and potential problems during recovery for the procedure that I have provided informed consent. Date  Time: 08/03/2018  8:10 AM  Pre-Procedure Preparation:  Monitoring: As per clinic protocol. Respiration, ETCO2, SpO2, BP, heart rate and rhythm monitor placed and checked for adequate function Safety Precautions: Patient was assessed for positional comfort and pressure points before starting the procedure. Time-out: I initiated and conducted the "Time-out" before starting the procedure, as per protocol. The patient was asked to participate by confirming the accuracy of the "Time Out" information. Verification of the correct person, site, and procedure were performed and confirmed by me, the nursing staff, and the patient. "Time-out" conducted as per Joint Commission's Universal Protocol (UP.01.01.01). Time: 0900  Description of Procedure:          Position: Prone with head of the table was raised to facilitate breathing. Target Area: For Epidural Steroid injections the target is the interlaminar space, initially targeting the lower border of the superior vertebral body lamina. Approach: Paramedial approach. Area Prepped: Entire PosteriorCervical Region Prepping solution: ChloraPrep (2% chlorhexidine gluconate and 70% isopropyl alcohol) Safety Precautions: Aspiration looking for blood return was conducted prior to all injections. At no point did we inject any substances, as a needle was being advanced. No attempts were made at seeking any paresthesias. Safe injection practices and needle disposal techniques used. Medications properly checked for expiration dates. SDV (single dose vial) medications used. Description of the Procedure: Protocol guidelines were followed. The procedure needle was introduced through the skin,  ipsilateral to the reported pain, and advanced to the target area. Bone was contacted and the needle walked caudad, until the lamina was cleared. The epidural space was identified using "loss-of-resistance technique" with 2-3 ml of PF-NaCl (0.9% NSS), in a 5cc LOR glass syringe. Vitals:   08/03/18 0906 08/03/18 0916 08/03/18 0926 08/03/18 0936  BP: (!) 86/66 (!) 127/58 (!) 113/55 (!) 105/57  Pulse:  62 64 62  Resp: 14 14 16 15   Temp:  97.6 F (36.4 C)  (!) 97.1 F (36.2 C)  TempSrc:  Temporal  Tympanic  SpO2: 98% 96% 96% 97%  Weight:      Height:        Start Time: 0900 hrs. End Time: 0905 hrs. Materials:  Needle(s) Type: Epidural needle Gauge: 17G Length: 3.5-in Medication(s): Please see orders for medications and dosing details.  Imaging Guidance (Spinal):          Type of Imaging Technique: Fluoroscopy Guidance (Spinal) Indication(s): Assistance in needle guidance and placement for procedures requiring needle placement in or near specific anatomical locations not easily accessible without such assistance. Exposure Time: Please see nurses notes. Contrast: Before injecting any contrast, we confirmed that the patient did not have an allergy to iodine, shellfish, or radiological contrast. Once satisfactory needle placement was completed at the desired level, radiological contrast was injected. Contrast injected under live fluoroscopy. No contrast complications. See chart for type and volume of contrast used. Fluoroscopic Guidance: I was personally present during the use of fluoroscopy. "Tunnel Vision Technique" used to obtain the best possible view of the target area. Parallax error corrected before commencing the procedure. "Direction-depth-direction" technique used to introduce the needle under continuous pulsed fluoroscopy. Once target was reached, antero-posterior, oblique, and lateral fluoroscopic projection used confirm needle placement in all planes. Images permanently stored in  EMR. Interpretation: I personally interpreted the imaging intraoperatively. Adequate needle placement confirmed in multiple planes. Appropriate spread of contrast into desired area was observed. No evidence of afferent or efferent intravascular uptake. No intrathecal or subarachnoid spread observed. Permanent images saved into the patient's record.  Antibiotic Prophylaxis:   Anti-infectives (From admission, onward)   None     Indication(s): None identified  Post-operative Assessment:  Post-procedure Vital Signs:  Pulse/HCG Rate: 6265 Temp: (!) 97.1 F (36.2 C) Resp: 15 BP: (!) 105/57 SpO2: 97 %  EBL: None  Complications: No immediate post-treatment complications observed by team, or reported by patient.  Note: The patient tolerated the entire procedure well. A repeat set of vitals were taken after the procedure and the patient was kept under observation following institutional policy, for this type of procedure. Post-procedural neurological assessment was performed, showing return to baseline, prior to discharge. The patient was provided with post-procedure discharge instructions, including a section on how to identify potential problems. Should any problems arise concerning this procedure, the patient was given instructions to immediately contact us, at any time, without hesitation. In any case, we plan to contact the patient by telephone for a follow-up status report regarding this interventional procedure.  Comments:  No additional relevant information.  Plan of Care    Imaging Orders     DG C-Arm 1-60 Min-No Report  Procedure Orders     Cervical Epidural Injection  Medications ordered for procedure: Meds ordered this encounter  Medications  . iopamidol (ISOVUE-M) 41 % intrathecal injection 10 mL    Must be Myelogram-compatible. If not available, you may substitute with a water-soluble, non-ionic, hypoallergenic, myelogram-compatible radiological contrast medium.  Marland Kitchen  lidocaine (XYLOCAINE) 2 % (with pres) injection 400 mg  . midazolam (VERSED) 5 MG/5ML injection 1-2 mg    Make sure Flumazenil is available in the pyxis when using this medication. If oversedation occurs, administer 0.2 mg IV over 15 sec. If after 45 sec no response, administer 0.2 mg again over 1 min; may repeat at 1 min intervals; not to exceed 4 doses (1 mg)  . fentaNYL (  SUBLIMAZE) injection 25-50 mcg    Make sure Narcan is available in the pyxis when using this medication. In the event of respiratory depression (RR< 8/min): Titrate NARCAN (naloxone) in increments of 0.1 to 0.2 mg IV at 2-3 minute intervals, until desired degree of reversal.  . lactated ringers infusion 1,000 mL  . glycopyrrolate (ROBINUL) injection 0.2 mg  . sodium chloride flush (NS) 0.9 % injection 1 mL  . ropivacaine (PF) 2 mg/mL (0.2%) (NAROPIN) injection 1 mL  . dexamethasone (DECADRON) injection 10 mg   Medications administered: We administered iopamidol, lidocaine, midazolam, fentaNYL, lactated ringers, sodium chloride flush, ropivacaine (PF) 2 mg/mL (0.2%), and dexamethasone.  See the medical record for exact dosing, route, and time of administration.  New Prescriptions   No medications on file   Disposition: Discharge home  Discharge Date & Time: 08/03/2018; 0938 hrs.   Physician-requested Follow-up: Return for post-procedure eval (2 wks), w/ Thad Ranger, NP.  Future Appointments  Date Time Provider Department Center  08/17/2018  9:30 AM Barbette Merino, NP [facility] None  09/14/2018  9:15 AM Barbette Merino, NP [facility] None   Primary Care Physician: Rayetta Humphrey, MD Location: [facility] Note by: Oswaldo Done, MD Date: 08/03/2018; Time: 10:06 AM  Disclaimer:  Medicine is not an exact science. The only guarantee in medicine is that nothing is guaranteed. It is important to note that the decision to proceed with this intervention was based on the information  collected from the patient. The Data and conclusions were drawn from the patient's questionnaire, the interview, and the physical examination. Because the information was provided in large part by the patient, it cannot be guaranteed that it has not been purposely or unconsciously manipulated. Every effort has been made to obtain as much relevant data as possible for this evaluation. It is important to note that the conclusions that lead to this procedure are derived in large part from the available data. Always take into account that the treatment will also be dependent on availability of resources and existing treatment guidelines, considered by other Pain Management Practitioners as being common knowledge and practice, at the time of the intervention. For Medico-Legal purposes, it is also important to point out that variation in procedural techniques and pharmacological choices are the acceptable norm. The indications, contraindications, technique, and results of the above procedure should only be interpreted and judged by a Board-Certified Interventional Pain Specialist with extensive familiarity and expertise in the same exact procedure and technique.

## 2018-08-03 NOTE — Progress Notes (Signed)
Safety precautions to be maintained throughout the outpatient stay will include: orient to surroundings, keep bed in low position, maintain call bell within reach at all times, provide assistance with transfer out of bed and ambulation.  

## 2018-08-03 NOTE — Patient Instructions (Signed)

## 2018-08-04 ENCOUNTER — Telehealth: Payer: Self-pay

## 2018-08-04 NOTE — Telephone Encounter (Signed)
Post procedure phone call.  Patient states she is doing great 

## 2018-08-17 ENCOUNTER — Ambulatory Visit: Payer: Medicare Other | Attending: Nurse Practitioner | Admitting: Nurse Practitioner

## 2018-08-17 ENCOUNTER — Other Ambulatory Visit: Payer: Self-pay

## 2018-08-17 ENCOUNTER — Encounter: Payer: Self-pay | Admitting: Nurse Practitioner

## 2018-08-17 VITALS — BP 117/56 | HR 77 | Temp 97.7°F | Ht 63.5 in | Wt 224.0 lb

## 2018-08-17 DIAGNOSIS — E785 Hyperlipidemia, unspecified: Secondary | ICD-10-CM | POA: Diagnosis not present

## 2018-08-17 DIAGNOSIS — M064 Inflammatory polyarthropathy: Secondary | ICD-10-CM | POA: Insufficient documentation

## 2018-08-17 DIAGNOSIS — F3341 Major depressive disorder, recurrent, in partial remission: Secondary | ICD-10-CM | POA: Insufficient documentation

## 2018-08-17 DIAGNOSIS — M5020 Other cervical disc displacement, unspecified cervical region: Secondary | ICD-10-CM | POA: Insufficient documentation

## 2018-08-17 DIAGNOSIS — D592 Drug-induced nonautoimmune hemolytic anemia: Secondary | ICD-10-CM | POA: Insufficient documentation

## 2018-08-17 DIAGNOSIS — Z886 Allergy status to analgesic agent status: Secondary | ICD-10-CM | POA: Insufficient documentation

## 2018-08-17 DIAGNOSIS — L409 Psoriasis, unspecified: Secondary | ICD-10-CM | POA: Insufficient documentation

## 2018-08-17 DIAGNOSIS — Z79899 Other long term (current) drug therapy: Secondary | ICD-10-CM | POA: Insufficient documentation

## 2018-08-17 DIAGNOSIS — K219 Gastro-esophageal reflux disease without esophagitis: Secondary | ICD-10-CM | POA: Diagnosis not present

## 2018-08-17 DIAGNOSIS — D696 Thrombocytopenia, unspecified: Secondary | ICD-10-CM | POA: Diagnosis not present

## 2018-08-17 DIAGNOSIS — I130 Hypertensive heart and chronic kidney disease with heart failure and stage 1 through stage 4 chronic kidney disease, or unspecified chronic kidney disease: Secondary | ICD-10-CM | POA: Diagnosis not present

## 2018-08-17 DIAGNOSIS — M810 Age-related osteoporosis without current pathological fracture: Secondary | ICD-10-CM | POA: Diagnosis not present

## 2018-08-17 DIAGNOSIS — Z981 Arthrodesis status: Secondary | ICD-10-CM | POA: Insufficient documentation

## 2018-08-17 DIAGNOSIS — J449 Chronic obstructive pulmonary disease, unspecified: Secondary | ICD-10-CM | POA: Diagnosis not present

## 2018-08-17 DIAGNOSIS — E1122 Type 2 diabetes mellitus with diabetic chronic kidney disease: Secondary | ICD-10-CM | POA: Diagnosis not present

## 2018-08-17 DIAGNOSIS — E1142 Type 2 diabetes mellitus with diabetic polyneuropathy: Secondary | ICD-10-CM | POA: Diagnosis not present

## 2018-08-17 DIAGNOSIS — M7918 Myalgia, other site: Secondary | ICD-10-CM

## 2018-08-17 DIAGNOSIS — M47812 Spondylosis without myelopathy or radiculopathy, cervical region: Secondary | ICD-10-CM | POA: Insufficient documentation

## 2018-08-17 DIAGNOSIS — E559 Vitamin D deficiency, unspecified: Secondary | ICD-10-CM | POA: Insufficient documentation

## 2018-08-17 DIAGNOSIS — D860 Sarcoidosis of lung: Secondary | ICD-10-CM | POA: Diagnosis not present

## 2018-08-17 DIAGNOSIS — Z836 Family history of other diseases of the respiratory system: Secondary | ICD-10-CM | POA: Insufficient documentation

## 2018-08-17 DIAGNOSIS — Z6839 Body mass index (BMI) 39.0-39.9, adult: Secondary | ICD-10-CM | POA: Diagnosis not present

## 2018-08-17 DIAGNOSIS — F514 Sleep terrors [night terrors]: Secondary | ICD-10-CM | POA: Diagnosis not present

## 2018-08-17 DIAGNOSIS — Z888 Allergy status to other drugs, medicaments and biological substances status: Secondary | ICD-10-CM | POA: Insufficient documentation

## 2018-08-17 DIAGNOSIS — M542 Cervicalgia: Secondary | ICD-10-CM

## 2018-08-17 DIAGNOSIS — M5481 Occipital neuralgia: Secondary | ICD-10-CM | POA: Insufficient documentation

## 2018-08-17 DIAGNOSIS — Z79891 Long term (current) use of opiate analgesic: Secondary | ICD-10-CM | POA: Insufficient documentation

## 2018-08-17 DIAGNOSIS — I5032 Chronic diastolic (congestive) heart failure: Secondary | ICD-10-CM | POA: Diagnosis not present

## 2018-08-17 DIAGNOSIS — Z8249 Family history of ischemic heart disease and other diseases of the circulatory system: Secondary | ICD-10-CM | POA: Insufficient documentation

## 2018-08-17 DIAGNOSIS — G894 Chronic pain syndrome: Secondary | ICD-10-CM | POA: Diagnosis present

## 2018-08-17 DIAGNOSIS — N183 Chronic kidney disease, stage 3 (moderate): Secondary | ICD-10-CM | POA: Insufficient documentation

## 2018-08-17 DIAGNOSIS — N281 Cyst of kidney, acquired: Secondary | ICD-10-CM | POA: Insufficient documentation

## 2018-08-17 DIAGNOSIS — Z794 Long term (current) use of insulin: Secondary | ICD-10-CM | POA: Insufficient documentation

## 2018-08-17 DIAGNOSIS — M797 Fibromyalgia: Secondary | ICD-10-CM | POA: Insufficient documentation

## 2018-08-17 DIAGNOSIS — Z87891 Personal history of nicotine dependence: Secondary | ICD-10-CM | POA: Insufficient documentation

## 2018-08-17 DIAGNOSIS — M792 Neuralgia and neuritis, unspecified: Secondary | ICD-10-CM

## 2018-08-17 DIAGNOSIS — N3281 Overactive bladder: Secondary | ICD-10-CM | POA: Insufficient documentation

## 2018-08-17 DIAGNOSIS — Z5181 Encounter for therapeutic drug level monitoring: Secondary | ICD-10-CM | POA: Insufficient documentation

## 2018-08-17 DIAGNOSIS — M4312 Spondylolisthesis, cervical region: Secondary | ICD-10-CM | POA: Diagnosis not present

## 2018-08-17 DIAGNOSIS — H8110 Benign paroxysmal vertigo, unspecified ear: Secondary | ICD-10-CM | POA: Insufficient documentation

## 2018-08-17 DIAGNOSIS — Z881 Allergy status to other antibiotic agents status: Secondary | ICD-10-CM | POA: Insufficient documentation

## 2018-08-17 DIAGNOSIS — Z811 Family history of alcohol abuse and dependence: Secondary | ICD-10-CM | POA: Insufficient documentation

## 2018-08-17 DIAGNOSIS — Z833 Family history of diabetes mellitus: Secondary | ICD-10-CM | POA: Insufficient documentation

## 2018-08-17 MED ORDER — TIZANIDINE HCL 4 MG PO TABS: 4.0000 mg | ORAL_TABLET | Freq: Two times a day (BID) | ORAL | 2 refills | Status: DC | PRN

## 2018-08-17 MED ORDER — GABAPENTIN 400 MG PO CAPS: 400.0000 mg | ORAL_CAPSULE | Freq: Three times a day (TID) | ORAL | 2 refills | Status: DC

## 2018-08-17 NOTE — Patient Instructions (Signed)
  BMI Assessment: Estimated body mass index is 39.06 kg/m as calculated from the following:   Height as of this encounter: 5' 3.5" (1.613 m).   Weight as of this encounter: 224 lb (101.6 kg).  BMI interpretation table: BMI level Category Range association with higher incidence of chronic pain  <18 kg/m2 Underweight   18.5-24.9 kg/m2 Ideal body weight   25-29.9 kg/m2 Overweight Increased incidence by 20%  30-34.9 kg/m2 Obese (Class I) Increased incidence by 68%  35-39.9 kg/m2 Severe obesity (Class II) Increased incidence by 136%  >40 kg/m2 Extreme obesity (Class III) Increased incidence by 254%   Patient's current BMI Ideal Body weight  Body mass index is 39.06 kg/m. Ideal body weight: 53.5 kg (118 lb 0.9 oz) Adjusted ideal body weight: 72.8 kg (160 lb 6.9 oz)   BMI Readings from Last 4 Encounters:  08/17/18 39.06 kg/m  08/03/18 38.71 kg/m  07/26/18 39.68 kg/m  06/29/18 38.71 kg/m   Wt Readings from Last 4 Encounters:  08/17/18 224 lb (101.6 kg)  08/03/18 222 lb (100.7 kg)  07/26/18 224 lb (101.6 kg)  06/29/18 222 lb (100.7 kg)

## 2018-08-17 NOTE — Progress Notes (Signed)
Patient's Name: Amber Acosta  MRN: [id number]  Referring Provider: Sharyne Peach, MD  DOB: Jun 16, 1951  PCP: Sharyne Peach, MD  DOS: 08/17/2018  Note by: Vevelyn Francois NP  Service setting: Ambulatory outpatient  Specialty: Interventional Pain Management  Location: [facility] (AMB) Pain Management Facility    Patient type: Established    Primary Reason(s) for Visit: Encounter for prescription drug management & post-procedure evaluation of chronic illness with mild to moderate exacerbation(Level of risk: moderate) CC: Neck Pain  HPI  Amber Acosta is a 67 y.o. year old, female patient, who comes today for a post-procedure evaluation and medication management. She has Cervical vertebral fusion (Uncomplicated C5 through C7 ACDF.); Fibromyalgia; Lumbar facet joint pain; Lumbar facet syndrome (Bilateral) (R>L); Stage 3 chronic kidney disease ([place]); COPD (chronic obstructive pulmonary disease) ([place]); Depression; Type 2 diabetes mellitus ([place]); Hypertensive heart disease with congestive heart failure ([place]); Gall bladder polyp; GERD (gastroesophageal reflux disease); Hyperlipemia; Inflammatory polyarthropathy ([place]); Sleep terror; Obesity, Class II, BMI 35-39.9, with comorbidity; OP (osteoporosis); Psoriasis; Kidney cysts; Muscle weakness (generalized); Sarcoidosis; Thrombocytopenia ([place]); Vitamin D deficiency; Chronic neck pain (Secondary source of pain) (Bilateral) (L>R); Cervicogenic headache (Left); Cervical facet syndrome (Left); At risk for falls; Balance problems; History of closed head injury; Opiate use (22.5 MME/Day); Long term current use of opiate analgesic; Encounter for therapeutic drug level monitoring; Long term prescription opiate use; Spondylolisthesis of cervical region; Cervical spine ankylosis; Hx of cervical spine surgery; Low magnesium levels; Myofascial pain; Neuropathy; Benign paroxysmal positional vertigo; Diabetic peripheral neuropathy ([place]); Meralgia paresthetica (Right); Cervical spondylosis;  Chronic diastolic congestive heart failure ([place]); Mixed stress and urge urinary incontinence; Overactive detrusor; 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[place]); Chronic low back pain (Primary Source of Pain) (Bilateral) (R>L); Chronic pain syndrome; Sarcoidosis of lung ([place]); Chronic back pain; Chronic Occipital neuralgia (Left); Epigastric pain; Displacement of cervical intervertebral disc without myelopathy; Essential hypertension; Hypertension; Morbid obesity with BMI of 40.0-44.9, adult ([place]); Recurrent major depressive disorder, in partial remission ([place]); Right arm weakness; Acute postoperative pain; Disturbance of skin sensation; Cervical spondylitis with radiculitis ([place]); Chronic shoulder pain [facility] source of pain) (Bilateral) (L>R); Spondylosis without myelopathy or radiculopathy, cervical region; Cervicalgia; Vasovagal episode; Nausea; Hypotension; Bradycardia; Neurogenic pain; and DDD (degenerative disc disease), cervical on their problem list. Her primarily concern today is the Neck Pain  Pain Assessment: Location: Right Neck Radiating: not radiating today, procedure help 100% Duration: Chronic pain Quality: Other (Comment)(Nothing sense epidural) Severity: 0-No pain/10 (subjective, self-reported pain score)  Note: Reported level is compatible with observation.                          Effect on ADL: "I feel good" Timing: Rarely Modifying factors: procedure, medicines BP: (!) 117/56  HR: 77  Amber Acosta was last seen on 08/04/2018 for a procedure. During today's appointment we reviewed Amber Acosta's post-procedure results, as well as her outpatient medication regimen.  Further details on both, my assessment(s), as well as the proposed treatment plan, please see below.  Post-Procedure Assessment  08/03/2018 procedure: Right cervical epidural steroid injection Pre-procedure pain score:  2/10 Post-procedure pain score: 0/10         Influential Factors: BMI: 39.06 kg/m Intra-procedural  challenges: None observed.         Assessment challenges: None detected.              Reported side-effects: None.        Post-procedural adverse reactions or complications: None  reported         Sedation: Please see nurses note. When no sedatives are used, the analgesic levels obtained are directly associated to the effectiveness of the local anesthetics. However, when sedation is provided, the level of analgesia obtained during the initial 1 hour following the intervention, is believed to be the result of a combination of factors. These factors may include, but are not limited to: 1. The effectiveness of the local anesthetics used. 2. The effects of the analgesic(s) and/or anxiolytic(s) used. 3. The degree of discomfort experienced by the patient at the time of the procedure. 4. The patients ability and reliability in recalling and recording the events. 5. The presence and influence of possible secondary gains and/or psychosocial factors. Reported result: Relief experienced during the 1st hour after the procedure: 100 % (Ultra-Short Term Relief)            Interpretative annotation: Clinically appropriate result. Analgesia during this period is likely to be Local Anesthetic and/or IV Sedative (Analgesic/Anxiolytic) related.          Effects of local anesthetic: The analgesic effects attained during this period are directly associated to the localized infiltration of local anesthetics and therefore cary significant diagnostic value as to the etiological location, or anatomical origin, of the pain. Expected duration of relief is directly dependent on the pharmacodynamics of the local anesthetic used. Long-acting (4-6 hours) anesthetics used.  Reported result: Relief during the next 4 to 6 hour after the procedure: 100 % (Short-Term Relief)            Interpretative annotation: Clinically appropriate result. Analgesia during this period is likely to be Local Anesthetic-related.          Long-term  benefit: Defined as the period of time past the expected duration of local anesthetics (1 hour for short-acting and 4-6 hours for long-acting). With the possible exception of prolonged sympathetic blockade from the local anesthetics, benefits during this period are typically attributed to, or associated with, other factors such as analgesic sensory neuropraxia, antiinflammatory effects, or beneficial biochemical changes provided by agents other than the local anesthetics.  Reported result: Extended relief following procedure: 100 % (Long-Term Relief)            Interpretative annotation: Clinically possible results. Good relief. No permanent benefit expected. Inflammation plays a part in the etiology to the pain.          Current benefits: Defined as reported results that persistent at this point in time.   Analgesia: 100 %            Function: Somewhat improved ROM: Somewhat improved Interpretative annotation: Complete relief.    Effective diagnostic intervention.          Interpretation: Results would suggest a successful diagnostic intervention.                  Plan:  Please see "Plan of Care" for details.                Laboratory Chemistry  Inflammation Markers (CRP: Acute Phase) (ESR: Chronic Phase) Lab Results  Component Value Date   CRP 2.1 (H) 03/26/2016   ESRSEDRATE 52 (H) 03/26/2016                         Rheumatology Markers No results found for: RF, ANA, [place], [place], [place], [place], HLAB27  Renal Function Markers Lab Results  Component Value Date   BUN 14 03/26/2016   CREATININE 1.28 (H) 03/26/2016   GFRAA 50 (L) 03/26/2016   GFRNONAA 44 (L) 03/26/2016                             Hepatic Function Markers Lab Results  Component Value Date   AST 24 03/26/2016   ALT 20 03/26/2016   ALBUMIN 4.0 03/26/2016   ALKPHOS 70 03/26/2016                        Electrolytes Lab Results  Component Value Date   NA 136 03/26/2016   K  3.8 03/26/2016   CL 100 (L) 03/26/2016   CALCIUM 8.5 (L) 03/26/2016   MG 2.1 06/22/2017                        Neuropathy Markers Lab Results  Component Value Date   VITAMINB12 628 06/22/2017                        CNS Tests No results found for: COLORCSF, APPEARCSF, RBCCOUNTCSF, WBCCSF, POLYSCSF, LYMPHSCSF, EOSCSF, PROTEINCSF, GLUCCSF, JCVIRUS, CSFOLI, IGGCSF                      Bone Pathology Markers Lab Results  Component Value Date   VD25OH 20.2 (L) 03/26/2016                         Coagulation Parameters Lab Results  Component Value Date   PLT 146 (L) 03/26/2016                        Cardiovascular Markers No results found for: BNP, CKTOTAL, CKMB, TROPONINI, HGB, HCT                       CA Markers No results found for: CEA, CA125, LABCA2                      Note: Lab results reviewed.  Recent Diagnostic Imaging Results  DG C-Arm 1-60 Min-No Report Fluoroscopy was utilized by the requesting physician.  No radiographic  interpretation.   Complexity Note: Imaging results reviewed. Results shared with Ms. Vidrine, using Layman's terms.                         Meds   Current Outpatient Medications:  .  ADVAIR DISKUS 250-50 MCG/DOSE AEPB, , Disp: , Rfl:  .  albuterol (PROVENTIL HFA;VENTOLIN HFA) 108 (90 Base) MCG/ACT inhaler, Inhale 2 puffs into the lungs every 6 (six) hours as needed. , Disp: , Rfl:  .  amLODipine (NORVASC) 10 MG tablet, Take 10 mg by mouth daily. , Disp: , Rfl:  .  B-D INS SYR ULTRAFINE 1CC/30G 30G X 1/2" 1 ML MISC, , Disp: , Rfl:  .  B-D INS SYR ULTRAFINE 1CC/31G 31G X 5/16" 1 ML MISC, , Disp: , Rfl:  .  FLUoxetine (PROZAC) 40 MG capsule, Take 40 mg by mouth daily. , Disp: , Rfl:  .  furosemide (LASIX) 40 MG tablet, Take 40 mg by mouth daily., Disp: , Rfl:  .  gabapentin (NEURONTIN) 100 MG capsule, Take 1-3 capsules (100-300 mg total)  by mouth 3 (three) times daily. Follow written titration schedule., Disp: 270 capsule, Rfl: 0 .  KLOR-CON  10 10 MEQ tablet, Take 10 mEq by mouth daily. , Disp: , Rfl:  .  losartan (COZAAR) 100 MG tablet, TAKE 1 TABLET (100 MG TOTAL) BY MOUTH ONCE DAILY., Disp: , Rfl:  .  lovastatin (MEVACOR) 10 MG tablet, Take 10 mg by mouth at bedtime., Disp: , Rfl:  .  metoprolol succinate (TOPROL-XL) 50 MG 24 hr tablet, Take 25 mg by mouth daily. , Disp: , Rfl:  .  nitroGLYCERIN (NITROSTAT) 0.3 MG SL tablet, 1 tablet sublingual prn for ESOPHAGEAL SPASMS(may repeat every 5 min, seek med help if pain persists after 3 tablets), Disp: , Rfl:  .  omeprazole (PRILOSEC) 40 MG capsule, Take 40 mg by mouth daily., Disp: , Rfl:  .  [START ON 09/13/2018] oxyCODONE (OXY IR/ROXICODONE) 5 MG immediate release tablet, Take 1 tablet (5 mg total) by mouth every 8 (eight) hours as needed for moderate pain or severe pain., Disp: 90 tablet, Rfl: 0 .  ranitidine (ZANTAC) 150 MG tablet, Take 150 mg by mouth 2 (two) times daily., Disp: , Rfl:  .  tiZANidine (ZANAFLEX) 4 MG tablet, Take 1 tablet (4 mg total) by mouth 2 (two) times daily as needed for muscle spasms., Disp: 60 tablet, Rfl: 2 .  traZODone (DESYREL) 100 MG tablet, Take 300 mg by mouth at bedtime. , Disp: , Rfl:  .  gabapentin (NEURONTIN) 400 MG capsule, Take 1 capsule (400 mg total) by mouth 3 (three) times daily., Disp: 90 capsule, Rfl: 2 .  hydrALAZINE (APRESOLINE) 25 MG tablet, Take 25 mg by mouth 2 (two) times daily. , Disp: , Rfl:  .  insulin NPH Human (HUMULIN N,NOVOLIN N) 100 UNIT/ML injection, 90 units in the am and 40 units at supper, Disp: , Rfl:  .  oxyCODONE (OXY IR/ROXICODONE) 5 MG immediate release tablet, Take 1 tablet (5 mg total) by mouth every 8 (eight) hours as needed for moderate pain or severe pain., Disp: 90 tablet, Rfl: 0 .  oxyCODONE (OXY IR/ROXICODONE) 5 MG immediate release tablet, Take 1 tablet (5 mg total) by mouth every 8 (eight) hours as needed for moderate pain or severe pain., Disp: 90 tablet, Rfl: 0  ROS  Constitutional: Denies any fever or  chills Gastrointestinal: No reported hemesis, hematochezia, vomiting, or acute GI distress Musculoskeletal: Denies any acute onset joint swelling, redness, loss of ROM, or weakness Neurological: No reported episodes of acute onset apraxia, aphasia, dysarthria, agnosia, amnesia, paralysis, loss of coordination, or loss of consciousness  Allergies  Ms. Luviano is allergic to aspirin; ciprofloxacin; etanercept; metformin; methotrexate; mushroom extract complex; other; darvon [propoxyphene]; minocycline; and cobalamine combinations.  PFSH  Drug: Ms. Goldberger  reports that she does not use drugs. Alcohol:  reports that she does not drink alcohol. Tobacco:  reports that she has quit smoking. She has quit using smokeless tobacco. Medical:  has a past medical history of Arthritis, Cervical spondylosis without myelopathy (09/21/2015), Chronic pain, Chronic pain associated with significant psychosocial dysfunction (08/28/2013), Depression, Diabetes mellitus without complication ([place]), Displacement of cervical intervertebral disc without myelopathy (09/05/2015), Fibromyalgia, GERD (gastroesophageal reflux disease), Hypertension, Kidney disease, chronic, stage III (GFR 30-59 ml/min) ([place]), Sarcoid, and Thrombopenia ([place]). Surgical: Ms. Civello  has a past surgical history that includes neck fusion; Breast surgery; Back surgery; Knee arthroscopy; Fracture surgery; Hand surgery; Abdominal hysterectomy; and Cyst excision. Family: family history includes Alcohol abuse in her brother; Alzheimer's disease in her  mother; COPD in her father; Diabetes in her father; Heart disease in her father.  Constitutional Exam  General appearance: Well nourished, well developed, and well hydrated. In no apparent acute distress Vitals:   08/17/18 0941  BP: (!) 117/56  Pulse: 77  Temp: 97.7 F (36.5 C)  Weight: 224 lb (101.6 kg)  Height: 5' 3.5" (1.613 m)  Psych/Mental status: Alert, oriented x 3 (person, place, & time)       Eyes:  PERLA Respiratory: No evidence of acute respiratory distress  Cervical Spine Area Exam  Skin & Axial Inspection: No masses, redness, edema, swelling, or associated skin lesions Alignment: Symmetrical Functional ROM: Unrestricted ROM      Stability: No instability detected Muscle Tone/Strength: Functionally intact. No obvious neuro-muscular anomalies detected. Sensory (Neurological): Unimpaired Palpation: No palpable anomalies              Upper Extremity (UE) Exam    Side: Right upper extremity  Side: Left upper extremity  Skin & Extremity Inspection: Skin color, temperature, and hair growth are WNL. No peripheral edema or cyanosis. No masses, redness, swelling, asymmetry, or associated skin lesions. No contractures.  Skin & Extremity Inspection: Skin color, temperature, and hair growth are WNL. No peripheral edema or cyanosis. No masses, redness, swelling, asymmetry, or associated skin lesions. No contractures.  Functional ROM: Unrestricted ROM          Functional ROM: Unrestricted ROM          Muscle Tone/Strength: Functionally intact. No obvious neuro-muscular anomalies detected.  Muscle Tone/Strength: Functionally intact. No obvious neuro-muscular anomalies detected.  Sensory (Neurological): Unimpaired          Sensory (Neurological): Unimpaired          Palpation: No palpable anomalies              Palpation: No palpable anomalies              Provocative Test(s):  Phalen's test: deferred Tinel's test: deferred Apley's scratch test (touch opposite shoulder):  Action 1 (Across chest): deferred Action 2 (Overhead): deferred Action 3 (LB reach): deferred   Provocative Test(s):  Phalen's test: deferred Tinel's test: deferred Apley's scratch test (touch opposite shoulder):  Action 1 (Across chest): deferred Action 2 (Overhead): deferred Action 3 (LB reach): deferred    Thoracic Spine Area Exam  Skin & Axial Inspection: No masses, redness, or swelling Alignment:  Symmetrical Functional ROM: Unrestricted ROM Stability: No instability detected Muscle Tone/Strength: Functionally intact. No obvious neuro-muscular anomalies detected. Sensory (Neurological): Unimpaired Muscle strength & Tone: No palpable anomalies  Lumbar Spine Area Exam  Skin & Axial Inspection: No masses, redness, or swelling Alignment: Symmetrical Functional ROM: Unrestricted ROM       Stability: No instability detected Muscle Tone/Strength: Functionally intact. No obvious neuro-muscular anomalies detected. Sensory (Neurological): Unimpaired Palpation: No palpable anomalies       Provocative Tests: Hyperextension/rotation test: deferred today       Lumbar quadrant test (Kemp's test): deferred today       Lateral bending test: deferred today       Patrick's Maneuver: deferred today                   FABER test: deferred today                   S-I anterior distraction/compression test: deferred today         S-I lateral compression test: deferred today  S-I Thigh-thrust test: deferred today         S-I Gaenslen's test: deferred today          Gait & Posture Assessment  Ambulation: Unassisted Gait: Relatively normal for age and body habitus Posture: WNL   Lower Extremity Exam    Side: Right lower extremity  Side: Left lower extremity  Stability: No instability observed          Stability: No instability observed          Skin & Extremity Inspection: Skin color, temperature, and hair growth are WNL. No peripheral edema or cyanosis. No masses, redness, swelling, asymmetry, or associated skin lesions. No contractures.  Skin & Extremity Inspection: Skin color, temperature, and hair growth are WNL. No peripheral edema or cyanosis. No masses, redness, swelling, asymmetry, or associated skin lesions. No contractures.  Functional ROM: Unrestricted ROM                  Functional ROM: Unrestricted ROM                  Muscle Tone/Strength: Functionally intact. No obvious  neuro-muscular anomalies detected.  Muscle Tone/Strength: Functionally intact. No obvious neuro-muscular anomalies detected.  Sensory (Neurological): Unimpaired  Sensory (Neurological): Unimpaired  Palpation: No palpable anomalies  Palpation: No palpable anomalies   Assessment  Primary Diagnosis & Pertinent Problem List: The primary encounter diagnosis was Cervical spondylosis. Diagnoses of Cervicalgia, Chronic Occipital neuralgia (Left), Fibromyalgia, Neurogenic pain, Myofascial pain, and Chronic pain syndrome were also pertinent to this visit.  Status Diagnosis  Controlled Controlled Controlled 1. Cervical spondylosis   2. Cervicalgia   3. Chronic Occipital neuralgia (Left)   4. Fibromyalgia   5. Neurogenic pain   6. Myofascial pain   7. Chronic pain syndrome     Problems updated and reviewed during this visit: No problems updated. Plan of Care  Pharmacotherapy (Medications Ordered): Meds ordered this encounter  Medications  . gabapentin (NEURONTIN) 400 MG capsule    Sig: Take 1 capsule (400 mg total) by mouth 3 (three) times daily.    Dispense:  90 capsule    Refill:  2    Order Specific Question:   Supervising Provider    Answer:   Milinda Pointer [phone]  . tiZANidine (ZANAFLEX) 4 MG tablet    Sig: Take 1 tablet (4 mg total) by mouth 2 (two) times daily as needed for muscle spasms.    Dispense:  60 tablet    Refill:  2    Do not place this medication, or any other prescription from our practice, on "Automatic Refill". Patient may have prescription filled one day early if pharmacy is closed on scheduled refill date.    Order Specific Question:   Supervising Provider    Answer:   Milinda Pointer [phone]   New Prescriptions   GABAPENTIN (NEURONTIN) 400 MG CAPSULE    Take 1 capsule (400 mg total) by mouth 3 (three) times daily.   Medications administered today: Mallory Seedorf had no medications administered during this visit. Lab-work, procedure(s), and/or  referral(s): No orders of the defined types were placed in this encounter.  Imaging and/or referral(s): None  Interventional management options: Planned, scheduled, and/or pending:   None at this time   Considering:   Palliativeleft sided cervical facet block Diagnostic/palliative bilateral lumbar facet block Possible bilateral lumbar facet radiofrequency ablation Diagnostic/palliative left cervical facet block Possible left-sided cervical facet radiofrequency ablation   Palliative PRN treatment(s):   Diagnostic/palliative bilateral lumbar facet  block Diagnostic/palliativeleft cervical facet block Palliativeleft sided cervical facet block    Provider-requested follow-up: Return in about 7 weeks (around 10/06/2018).  Future Appointments  Date Time Provider [place]  10/26/2018  9:00 AM Vevelyn Francois, NP [facility] None   Primary Care Physician: Sharyne Peach, MD Location: [facility] Out[facility] Management Facility Note by: Vevelyn Francois NP Date: 08/17/2018; Time: 10:55 AM  Pain Score Disclaimer: We use the NRS-11 scale. This is a self-reported, subjective measurement of pain severity with only modest accuracy. It is used primarily to identify changes within a particular patient. It must be understood that outpatient pain scales are significantly less accurate that those used for research, where they can be applied under ideal controlled circumstances with minimal exposure to variables. In reality, the score is likely to be a combination of pain intensity and pain affect, where pain affect describes the degree of emotional arousal or changes in action readiness caused by the sensory experience of pain. Factors such as social and work situation, setting, emotional state, anxiety levels, expectation, and prior pain experience may influence pain perception and show large inter-individual differences that may also be affected by time variables.  Patient instructions  provided during this appointment: Patient Instructions    BMI Assessment: Estimated body mass index is 39.06 kg/m as calculated from the following:   Height as of this encounter: 5' 3.5" (1.613 m).   Weight as of this encounter: 224 lb (101.6 kg).  BMI interpretation table: BMI level Category Range association with higher incidence of chronic pain  <18 kg/m2 Underweight   18.5-24.9 kg/m2 Ideal body weight   25-29.9 kg/m2 Overweight Increased incidence by 20%  30-34.9 kg/m2 Obese (Class I) Increased incidence by 68%  35-39.9 kg/m2 Severe obesity (Class II) Increased incidence by 136%  >40 kg/m2 Extreme obesity (Class III) Increased incidence by 254%   Patient's current BMI Ideal Body weight  Body mass index is 39.06 kg/m. Ideal body weight: 53.5 kg (118 lb 0.9 oz) Adjusted ideal body weight: 72.8 kg (160 lb 6.9 oz)   BMI Readings from Last 4 Encounters:  08/17/18 39.06 kg/m  08/03/18 38.71 kg/m  07/26/18 39.68 kg/m  06/29/18 38.71 kg/m   Wt Readings from Last 4 Encounters:  08/17/18 224 lb (101.6 kg)  08/03/18 222 lb (100.7 kg)  07/26/18 224 lb (101.6 kg)  06/29/18 222 lb (100.7 kg)

## 2018-08-17 NOTE — Progress Notes (Signed)
Nursing Pain Medication Assessment:  Safety precautions to be maintained throughout the outpatient stay will include: orient to surroundings, keep bed in low position, maintain call bell within reach at all times, provide assistance with transfer out of bed and ambulation.  Medication Inspection Compliance: Pill count conducted under aseptic conditions, in front of the patient. Neither the pills nor the bottle was removed from the patient's sight at any time. Once count was completed pills were immediately returned to the patient in their original bottle.  Medication: Oxycodone IR Pill/Patch Count: 5 of 90 pills remain Pill/Patch Appearance: Markings consistent with prescribed medication Bottle Appearance: Standard pharmacy container. Clearly labeled. Filled Date: 8 / 3119 / 2019 Last Medication intake:  Today

## 2018-08-26 ENCOUNTER — Other Ambulatory Visit: Payer: Self-pay | Admitting: Pain Medicine

## 2018-08-26 DIAGNOSIS — M797 Fibromyalgia: Secondary | ICD-10-CM

## 2018-08-26 DIAGNOSIS — M792 Neuralgia and neuritis, unspecified: Secondary | ICD-10-CM

## 2018-09-04 ENCOUNTER — Other Ambulatory Visit: Payer: Self-pay | Admitting: Nurse Practitioner

## 2018-09-04 DIAGNOSIS — M7918 Myalgia, other site: Secondary | ICD-10-CM

## 2018-09-14 ENCOUNTER — Ambulatory Visit: Payer: Medicare Other | Admitting: Nurse Practitioner

## 2018-10-11 ENCOUNTER — Telehealth: Payer: Self-pay

## 2018-10-11 NOTE — Telephone Encounter (Signed)
Patient does not need prior auth for GONB. Annette to call and put on schedule for left GONB with sedation. Pre-procedure instructions given.

## 2018-10-11 NOTE — Telephone Encounter (Signed)
Pt requesting procedure for headaches CESI and needs an order places. No prior auth needed.

## 2018-10-21 ENCOUNTER — Other Ambulatory Visit: Payer: Self-pay

## 2018-10-21 ENCOUNTER — Ambulatory Visit (HOSPITAL_BASED_OUTPATIENT_CLINIC_OR_DEPARTMENT_OTHER): Payer: Medicare Other | Admitting: Pain Medicine

## 2018-10-21 ENCOUNTER — Encounter: Payer: Self-pay | Admitting: Pain Medicine

## 2018-10-21 ENCOUNTER — Ambulatory Visit
Admission: RE | Admit: 2018-10-21 | Discharge: 2018-10-21 | Disposition: A | Discharge status: Home / Self Care | Payer: Medicare Other | Source: Ambulatory Visit | Attending: Pain Medicine | Admitting: Pain Medicine

## 2018-10-21 VITALS — BP 102/81 | HR 67 | Temp 98.3°F | Resp 15 | Ht 63.0 in | Wt 224.0 lb

## 2018-10-21 DIAGNOSIS — R51 Headache: Secondary | ICD-10-CM | POA: Diagnosis present

## 2018-10-21 DIAGNOSIS — M5481 Occipital neuralgia: Secondary | ICD-10-CM | POA: Diagnosis not present

## 2018-10-21 DIAGNOSIS — M47812 Spondylosis without myelopathy or radiculopathy, cervical region: Secondary | ICD-10-CM

## 2018-10-21 DIAGNOSIS — M47816 Spondylosis without myelopathy or radiculopathy, lumbar region: Secondary | ICD-10-CM

## 2018-10-21 DIAGNOSIS — Z981 Arthrodesis status: Secondary | ICD-10-CM | POA: Diagnosis not present

## 2018-10-21 DIAGNOSIS — G4486 Cervicogenic headache: Secondary | ICD-10-CM

## 2018-10-21 MED ORDER — DEXAMETHASONE SODIUM PHOSPHATE 10 MG/ML IJ SOLN
10.0000 mg | Freq: Once | INTRAMUSCULAR | Status: AC
  Administered 2018-10-21: 10 mg
  Filled 2018-10-21: qty 1

## 2018-10-21 MED ORDER — FENTANYL CITRATE (PF) 100 MCG/2ML IJ SOLN
25.0000 ug | INTRAMUSCULAR | Status: DC | PRN
  Administered 2018-10-21: 50 ug via INTRAVENOUS
  Filled 2018-10-21: qty 2

## 2018-10-21 MED ORDER — LIDOCAINE HCL 2 % IJ SOLN
20.0000 mL | Freq: Once | INTRAMUSCULAR | Status: AC
  Administered 2018-10-21: 400 mg

## 2018-10-21 MED ORDER — LACTATED RINGERS IV SOLN
1000.0000 mL | Freq: Once | INTRAVENOUS | Status: AC
  Administered 2018-10-21: 1000 mL via INTRAVENOUS

## 2018-10-21 MED ORDER — MIDAZOLAM HCL 5 MG/5ML IJ SOLN
1.0000 mg | INTRAMUSCULAR | Status: DC | PRN
  Administered 2018-10-21: 2 mg via INTRAVENOUS
  Filled 2018-10-21: qty 5

## 2018-10-21 MED ORDER — ROPIVACAINE HCL 2 MG/ML IJ SOLN
4.0000 mL | Freq: Once | INTRAMUSCULAR | Status: AC
  Administered 2018-10-21: 4 mL
  Filled 2018-10-21: qty 10

## 2018-10-21 NOTE — Patient Instructions (Signed)

## 2018-10-21 NOTE — Progress Notes (Signed)
Patient's Name: Amber Acosta  MRN: [id number]  Referring Provider: Rayetta Humphrey, MD  DOB: [birth year]  PCP: Rayetta Humphrey, MD  DOS: 10/21/2018  Note by: Oswaldo Done, MD  Service setting: Ambulatory outpatient  Specialty: Interventional Pain Management  Patient type: Established  Location: [facility] (AMB) Pain Management Facility  Visit type: Interventional Procedure   Primary Reason for Visit: Interventional Pain Management Treatment. CC: Headache (left)  Procedure:          Anesthesia, Analgesia, Anxiolysis:  Type: Diagnostic, Greater, Occipital Nerve Block  #2  Region: Posterolateral Cervical Level: Occipital Ridge   Laterality: Left-Sided  Type: Moderate (Conscious) Sedation combined with Local Anesthesia Indication(s): Analgesia and Anxiety Route: Intravenous (IV) IV Access: Secured Sedation: Meaningful verbal contact was maintained at all times during the procedure  Local Anesthetic: Lidocaine 1-2%  Position: Prone   Indications: 1. Cervicogenic headache (Left)   2. Chronic Occipital neuralgia (Left)    Pain Score: Pre-procedure: 1 /10 Post-procedure: 0-No pain/10  Pre-op Assessment:  Amber Acosta is a 67 y.o. (year old), female patient, seen today for interventional treatment. She  has a past surgical history that includes neck fusion; Breast surgery; Back surgery; Knee arthroscopy; Fracture surgery; Hand surgery; Abdominal hysterectomy; and Cyst excision. Amber Acosta has a current medication list which includes the following prescription(s): advair diskus, albuterol, amlodipine, b-d ins syr ultrafine 1cc/30g, b-d ins syr ultrafine 1cc/31g, fluoxetine, furosemide, gabapentin, klor-con 10, losartan, lovastatin, metoprolol succinate, nitroglycerin, omeprazole, ranitidine, tizanidine, trazodone, gabapentin, hydralazine, insulin nph human, oxycodone, oxycodone, and oxycodone, and the following Facility-Administered Medications: fentanyl, lactated ringers, and midazolam. Her  primarily concern today is the Headache (left)  Initial Vital Signs:  Pulse/HCG Rate: 67ECG Heart Rate: 66 Temp: 98.4 F (36.9 C) Resp: 16 BP: (!) 133/54 SpO2: 93 %  BMI: Estimated body mass index is 39.68 kg/m as calculated from the following:   Height as of this encounter: 5\' 3"  (1.6 m).   Weight as of this encounter: 224 lb (101.6 kg).  Risk Assessment: Allergies: Reviewed. She is allergic to aspirin; ciprofloxacin; etanercept; metformin; methotrexate; mushroom extract complex; other; darvon [propoxyphene]; minocycline; and cobalamin combinations.  Allergy Precautions: None required Coagulopathies: Reviewed. None identified.  Blood-thinner therapy: None at this time Active Infection(s): Reviewed. None identified. Amber Acosta is afebrile  Site Confirmation: Amber Acosta was asked to confirm the procedure and laterality before marking the site Procedure checklist: Completed Consent: Before the procedure and under the influence of no sedative(s), amnesic(s), or anxiolytics, the patient was informed of the treatment options, risks and possible complications. To fulfill our ethical and legal obligations, as recommended by the American Medical Association's Code of Ethics, I have informed the patient of my clinical impression; the nature and purpose of the treatment or procedure; the risks, benefits, and possible complications of the intervention; the alternatives, including doing nothing; the risk(s) and benefit(s) of the alternative treatment(s) or procedure(s); and the risk(s) and benefit(s) of doing nothing. The patient was provided information about the general risks and possible complications associated with the procedure. These may include, but are not limited to: failure to achieve desired goals, infection, bleeding, organ or nerve damage, allergic reactions, paralysis, and death. In addition, the patient was informed of those risks and complications associated to the procedure, such as  failure to decrease pain; infection; bleeding; organ or nerve damage with subsequent damage to sensory, motor, and/or autonomic systems, resulting in permanent pain, numbness, and/or weakness of one or several areas of the body; allergic reactions; (i.e.:  anaphylactic reaction); and/or death. Furthermore, the patient was informed of those risks and complications associated with the medications. These include, but are not limited to: allergic reactions (i.e.: anaphylactic or anaphylactoid reaction(s)); adrenal axis suppression; blood sugar elevation that in diabetics may result in ketoacidosis or comma; water retention that in patients with history of congestive heart failure may result in shortness of breath, pulmonary edema, and decompensation with resultant heart failure; weight gain; swelling or edema; medication-induced neural toxicity; particulate matter embolism and blood vessel occlusion with resultant organ, and/or nervous system infarction; and/or aseptic necrosis of one or more joints. Finally, the patient was informed that Medicine is not an exact science; therefore, there is also the possibility of unforeseen or unpredictable risks and/or possible complications that may result in a catastrophic outcome. The patient indicated having understood very clearly. We have given the patient no guarantees and we have made no promises. Enough time was given to the patient to ask questions, all of which were answered to the patient's satisfaction. Amber Acosta has indicated that she wanted to continue with the procedure. Attestation: I, the ordering provider, attest that I have discussed with the patient the benefits, risks, side-effects, alternatives, likelihood of achieving goals, and potential problems during recovery for the procedure that I have provided informed consent. Date  Time: 10/21/2018 12:23 PM  Pre-Procedure Preparation:  Monitoring: As per clinic protocol. Respiration, ETCO2, SpO2, BP, heart  rate and rhythm monitor placed and checked for adequate function Safety Precautions: Patient was assessed for positional comfort and pressure points before starting the procedure. Time-out: I initiated and conducted the "Time-out" before starting the procedure, as per protocol. The patient was asked to participate by confirming the accuracy of the "Time Out" information. Verification of the correct person, site, and procedure were performed and confirmed by me, the nursing staff, and the patient. "Time-out" conducted as per Joint Commission's Universal Protocol (UP.01.01.01). Time: 1304  Description of Procedure:          Target Area: Area medial to the occipital artery at the level of the superior nuchal ridge Approach: Posterior approach Area Prepped: Entire Posterior Occipital Region Prepping solution: ChloraPrep (2% chlorhexidine gluconate and 70% isopropyl alcohol) Safety Precautions: Aspiration looking for blood return was conducted prior to all injections. At no point did we inject any substances, as a needle was being advanced. No attempts were made at seeking any paresthesias. Safe injection practices and needle disposal techniques used. Medications properly checked for expiration dates. SDV (single dose vial) medications used. Description of the Procedure: Protocol guidelines were followed. The target area was identified and the area prepped in the usual manner. Skin & deeper tissues infiltrated with local anesthetic. Appropriate amount of time allowed to pass for local anesthetics to take effect. The procedure needles were then advanced to the target area. Proper needle placement secured. Negative aspiration confirmed. Solution injected in intermittent fashion, asking for systemic symptoms every 0.5cc of injectate. The needles were then removed and the area cleansed, making sure to leave some of the prepping solution back to take advantage of its long term bactericidal properties.  Vitals:    10/21/18 1301 10/21/18 1307 10/21/18 1317 10/21/18 1329  BP: 119/64 (!) 117/94 (!) 110/34 102/81  Pulse:      Resp: 12 15 (!) 21 15  Temp:    98.3 F (36.8 C)  TempSrc:    Temporal  SpO2: 93% 95% 92% 94%  Weight:      Height:  Start Time: 1304 hrs. End Time: 1307 hrs. Materials:  Needle(s) Type: Spinal Needle Gauge: 22G Length: 3.5-in Medication(s): Please see orders for medications and dosing details.  Imaging Guidance (Non-Spinal):          Type of Imaging Technique: Fluoroscopy Guidance (Non-Spinal) Indication(s): Assistance in needle guidance and placement for procedures requiring needle placement in or near specific anatomical locations not easily accessible without such assistance. Exposure Time: Please see nurses notes. Contrast: None used. Fluoroscopic Guidance: I was personally present during the use of fluoroscopy. "Tunnel Vision Technique" used to obtain the best possible view of the target area. Parallax error corrected before commencing the procedure. "Direction-depth-direction" technique used to introduce the needle under continuous pulsed fluoroscopy. Once target was reached, antero-posterior, oblique, and lateral fluoroscopic projection used confirm needle placement in all planes. Images permanently stored in EMR. Interpretation: No contrast injected. I personally interpreted the imaging intraoperatively. Adequate needle placement confirmed in multiple planes. Permanent images saved into the patient's record.  Antibiotic Prophylaxis:   Anti-infectives (From admission, onward)   None     Indication(s): None identified  Post-operative Assessment:  Post-procedure Vital Signs:  Pulse/HCG Rate: 6768 Temp: 98.3 F (36.8 C) Resp: 15 BP: 102/81 SpO2: 94 %  EBL: None  Complications: No immediate post-treatment complications observed by team, or reported by patient.  Note: The patient tolerated the entire procedure well. A repeat set of vitals were taken  after the procedure and the patient was kept under observation following institutional policy, for this type of procedure. Post-procedural neurological assessment was performed, showing return to baseline, prior to discharge. The patient was provided with post-procedure discharge instructions, including a section on how to identify potential problems. Should any problems arise concerning this procedure, the patient was given instructions to immediately contact us, at any time, without hesitation. In any case, we plan to contact the patient by telephone for a follow-up status report regarding this interventional procedure.  Comments:  No additional relevant information.  Plan of Care   Interventional management options: Planned, scheduled, and/or pending:   NOTE: NO RFA (Poor candidate due to delayed sensory perception and intra-procedural non-compliance.)  Diagnostic left-sided greater occipital nerve block #1 under fluoroscopic guidance and IV sedation, today.    Considering:   Therapeutic left-sided greater occipital nerve block #3    Palliative PRN treatment(s):   Palliative bilateral lumbar facet block (NO RFA) (for low back pain)  Palliativeleft cervical facet block(NO RFA) (for left-sided neck pain)  Palliative left-sided greater occipital nerve block (NO RFA) (for left-sided headaches)     Imaging Orders     DG C-Arm 1-60 Min-No Report  Procedure Orders     GREATER OCCIPITAL NERVE BLOCK     CERVICAL FACET (MEDIAL BRANCH NERVE BLOCK)      LUMBAR FACET(MEDIAL BRANCH NERVE BLOCK) MBNB     GREATER OCCIPITAL NERVE BLOCK  Medications ordered for procedure: Meds ordered this encounter  Medications  . lidocaine (XYLOCAINE) 2 % (with pres) injection 400 mg  . midazolam (VERSED) 5 MG/5ML injection 1-2 mg    Make sure Flumazenil is available in the pyxis when using this medication. If oversedation occurs, administer 0.2 mg IV over 15 sec. If after 45 sec no response, administer 0.2  mg again over 1 min; may repeat at 1 min intervals; not to exceed 4 doses (1 mg)  . fentaNYL (SUBLIMAZE) injection 25-50 mcg    Make sure Narcan is available in the pyxis when using this medication. In the event of  respiratory depression (RR< 8/min): Titrate NARCAN (naloxone) in increments of 0.1 to 0.2 mg IV at 2-3 minute intervals, until desired degree of reversal.  . lactated ringers infusion 1,000 mL  . ropivacaine (PF) 2 mg/mL (0.2%) (NAROPIN) injection 4 mL  . dexamethasone (DECADRON) injection 10 mg   Medications administered: We administered lidocaine, midazolam, fentaNYL, lactated ringers, ropivacaine (PF) 2 mg/mL (0.2%), and dexamethasone.  See the medical record for exact dosing, route, and time of administration.  Disposition: Discharge home  Discharge Date & Time: 10/21/2018; 1334 hrs.   Physician-requested Follow-up: Return for post-procedure eval (2 wks), w/ Thad Rangerrystal King, NP.  Future Appointments  Date Time Provider Department Center  10/26/2018  9:00 AM Barbette MerinoKing, Crystal M, NP [facility] None  11/04/2018 11:30 AM [facility]   Primary Care Physician: Rayetta HumphreyGeorge, Sionne A, MD [facility] Note by: Oswaldo DoneFrancisco A Coy Rochford, MD Date: 10/21/2018; Time: 1:45 PM  Disclaimer:  Medicine is not an Visual merchandiserexact science. The only guarantee in medicine is that nothing is guaranteed. It is important to note that the decision to proceed with this intervention was based on the information collected from the patient. The Data and conclusions were drawn from the patient's questionnaire, the interview, and the physical examination. Because the information was provided in large part by the patient, it cannot be guaranteed that it has not been purposely or unconsciously manipulated. Every effort has been made to obtain as much relevant data as possible for this evaluation. It is important to note that the conclusions that lead to this procedure are  derived in large part from the available data. Always take into account that the treatment will also be dependent on availability of resources and existing treatment guidelines, considered by other Pain Management Practitioners as being common knowledge and practice, at the time of the intervention. For Medico-Legal purposes, it is also important to point out that variation in procedural techniques and pharmacological choices are the acceptable norm. The indications, contraindications, technique, and results of the above procedure should only be interpreted and judged by a Board-Certified Interventional Pain Specialist with extensive familiarity and expertise in the same exact procedure and technique.

## 2018-10-21 NOTE — Progress Notes (Signed)
Safety precautions to be maintained throughout the outpatient stay will include: orient to surroundings, keep bed in low position, maintain call bell within reach at all times, provide assistance with transfer out of bed and ambulation.  

## 2018-10-22 ENCOUNTER — Telehealth: Payer: Self-pay

## 2018-10-22 NOTE — Telephone Encounter (Signed)
Post procedure phone call.  LM 

## 2018-10-26 ENCOUNTER — Encounter: Payer: Medicare Other | Admitting: Nurse Practitioner

## 2018-11-04 ENCOUNTER — Encounter: Payer: Self-pay | Admitting: Nurse Practitioner

## 2018-11-04 ENCOUNTER — Ambulatory Visit: Payer: Medicare Other | Attending: Nurse Practitioner | Admitting: Nurse Practitioner

## 2018-11-04 ENCOUNTER — Other Ambulatory Visit: Payer: Self-pay

## 2018-11-04 VITALS — BP 137/58 | HR 65 | Temp 97.9°F | Ht 64.0 in | Wt 225.0 lb

## 2018-11-04 DIAGNOSIS — D869 Sarcoidosis, unspecified: Secondary | ICD-10-CM | POA: Diagnosis not present

## 2018-11-04 DIAGNOSIS — Z6839 Body mass index (BMI) 39.0-39.9, adult: Secondary | ICD-10-CM | POA: Insufficient documentation

## 2018-11-04 DIAGNOSIS — M5481 Occipital neuralgia: Secondary | ICD-10-CM | POA: Diagnosis not present

## 2018-11-04 DIAGNOSIS — Z79899 Other long term (current) drug therapy: Secondary | ICD-10-CM | POA: Insufficient documentation

## 2018-11-04 DIAGNOSIS — F329 Major depressive disorder, single episode, unspecified: Secondary | ICD-10-CM | POA: Diagnosis not present

## 2018-11-04 DIAGNOSIS — M47816 Spondylosis without myelopathy or radiculopathy, lumbar region: Secondary | ICD-10-CM | POA: Diagnosis not present

## 2018-11-04 DIAGNOSIS — M47812 Spondylosis without myelopathy or radiculopathy, cervical region: Secondary | ICD-10-CM | POA: Diagnosis not present

## 2018-11-04 DIAGNOSIS — M797 Fibromyalgia: Secondary | ICD-10-CM | POA: Diagnosis not present

## 2018-11-04 DIAGNOSIS — M4312 Spondylolisthesis, cervical region: Secondary | ICD-10-CM | POA: Insufficient documentation

## 2018-11-04 DIAGNOSIS — M7918 Myalgia, other site: Secondary | ICD-10-CM

## 2018-11-04 DIAGNOSIS — J449 Chronic obstructive pulmonary disease, unspecified: Secondary | ICD-10-CM | POA: Insufficient documentation

## 2018-11-04 DIAGNOSIS — G894 Chronic pain syndrome: Secondary | ICD-10-CM | POA: Diagnosis not present

## 2018-11-04 DIAGNOSIS — D696 Thrombocytopenia, unspecified: Secondary | ICD-10-CM | POA: Diagnosis not present

## 2018-11-04 DIAGNOSIS — Z794 Long term (current) use of insulin: Secondary | ICD-10-CM | POA: Diagnosis not present

## 2018-11-04 DIAGNOSIS — M064 Inflammatory polyarthropathy: Secondary | ICD-10-CM

## 2018-11-04 DIAGNOSIS — N183 Chronic kidney disease, stage 3 (moderate): Secondary | ICD-10-CM | POA: Insufficient documentation

## 2018-11-04 DIAGNOSIS — I129 Hypertensive chronic kidney disease with stage 1 through stage 4 chronic kidney disease, or unspecified chronic kidney disease: Secondary | ICD-10-CM | POA: Insufficient documentation

## 2018-11-04 DIAGNOSIS — E1122 Type 2 diabetes mellitus with diabetic chronic kidney disease: Secondary | ICD-10-CM | POA: Insufficient documentation

## 2018-11-04 DIAGNOSIS — Z981 Arthrodesis status: Secondary | ICD-10-CM | POA: Diagnosis not present

## 2018-11-04 DIAGNOSIS — M810 Age-related osteoporosis without current pathological fracture: Secondary | ICD-10-CM | POA: Insufficient documentation

## 2018-11-04 DIAGNOSIS — N3946 Mixed incontinence: Secondary | ICD-10-CM | POA: Insufficient documentation

## 2018-11-04 DIAGNOSIS — E669 Obesity, unspecified: Secondary | ICD-10-CM | POA: Diagnosis not present

## 2018-11-04 DIAGNOSIS — E1342 Other specified diabetes mellitus with diabetic polyneuropathy: Secondary | ICD-10-CM | POA: Diagnosis not present

## 2018-11-04 DIAGNOSIS — L409 Psoriasis, unspecified: Secondary | ICD-10-CM | POA: Diagnosis not present

## 2018-11-04 DIAGNOSIS — E559 Vitamin D deficiency, unspecified: Secondary | ICD-10-CM | POA: Diagnosis not present

## 2018-11-04 DIAGNOSIS — I5032 Chronic diastolic (congestive) heart failure: Secondary | ICD-10-CM | POA: Insufficient documentation

## 2018-11-04 DIAGNOSIS — M545 Low back pain: Secondary | ICD-10-CM

## 2018-11-04 DIAGNOSIS — M5459 Other low back pain: Secondary | ICD-10-CM

## 2018-11-04 DIAGNOSIS — M542 Cervicalgia: Secondary | ICD-10-CM | POA: Diagnosis present

## 2018-11-04 MED ORDER — OXYCODONE HCL 5 MG PO TABS: 5.0000 mg | ORAL_TABLET | Freq: Three times a day (TID) | ORAL | 0 refills | Status: DC | PRN

## 2018-11-04 MED ORDER — TIZANIDINE HCL 4 MG PO TABS: 4.0000 mg | ORAL_TABLET | Freq: Two times a day (BID) | ORAL | 2 refills | Status: DC | PRN

## 2018-11-04 MED ORDER — GABAPENTIN 400 MG PO CAPS: 400.0000 mg | ORAL_CAPSULE | Freq: Three times a day (TID) | ORAL | 2 refills | Status: DC

## 2018-11-04 NOTE — Progress Notes (Signed)
Nursing Pain Medication Assessment:  Safety precautions to be maintained throughout the outpatient stay will include: orient to surroundings, keep bed in low position, maintain call bell within reach at all times, provide assistance with transfer out of bed and ambulation.  Medication Inspection Compliance: Pill count conducted under aseptic conditions, in front of the patient. Neither the pills nor the bottle was removed from the patient's sight at any time. Once count was completed pills were immediately returned to the patient in their original bottle.  Medication: Oxycodone IR Pill/Patch Count: 78 of 90 pills remain Pill/Patch Appearance: Markings consistent with prescribed medication Bottle Appearance: Standard pharmacy container. Clearly labeled. Filled Date: 912 / 1 / 2019 Last Medication intake:  Today

## 2018-11-04 NOTE — Patient Instructions (Signed)
____________________________________________________________________________________________  Medication Rules  Purpose: To inform patients, and their family members, of our rules and regulations.  Applies to: All patients receiving prescriptions (written or electronic).  Pharmacy of record: Pharmacy where electronic prescriptions will be sent. If written prescriptions are taken to a different pharmacy, please inform the nursing staff. The pharmacy listed in the electronic medical record should be the one where you would like electronic prescriptions to be sent.  Electronic prescriptions: In compliance with the [place] Strengthen Opioid Misuse Prevention (STOP) Act of 2017 (Session Law 2017-74/H243), effective December 01, 2018, all controlled substances must be electronically prescribed. Calling prescriptions to the pharmacy will cease to exist.  Prescription refills: Only during scheduled appointments. Applies to all prescriptions.  NOTE: The following applies primarily to controlled substances (Opioid* Pain Medications).   Patient's responsibilities: 1. Pain Pills: Bring all pain pills to every appointment (except for procedure appointments). 2. Pill Bottles: Bring pills in original pharmacy bottle. Always bring the newest bottle. Bring bottle, even if empty. 3. Medication refills: You are responsible for knowing and keeping track of what medications you take and those you need refilled. The day before your appointment: write a list of all prescriptions that need to be refilled. The day of the appointment: give the list to the admitting nurse. Prescriptions will be written only during appointments. If you forget a medication: it will not be "Called in", "Faxed", or "electronically sent". You will need to get another appointment to get these prescribed. No early refills. Do not call asking to have your prescription filled early. 4. Prescription Accuracy: You are responsible for  carefully inspecting your prescriptions before leaving our office. Have the discharge nurse carefully go over each prescription with you, before taking them home. Make sure that your name is accurately spelled, that your address is correct. Check the name and dose of your medication to make sure it is accurate. Check the number of pills, and the written instructions to make sure they are clear and accurate. Make sure that you are given enough medication to last until your next medication refill appointment. 5. Taking Medication: Take medication as prescribed. When it comes to controlled substances, taking less pills or less frequently than prescribed is permitted and encouraged. Never take more pills than instructed. Never take medication more frequently than prescribed.  6. Inform other Doctors: Always inform, all of your healthcare providers, of all the medications you take. 7. Pain Medication from other Providers: You are not allowed to accept any additional pain medication from any other Doctor or Healthcare provider. There are two exceptions to this rule. (see below) In the event that you require additional pain medication, you are responsible for notifying us, as stated below. 8. Medication Agreement: You are responsible for carefully reading and following our Medication Agreement. This must be signed before receiving any prescriptions from our practice. Safely store a copy of your signed Agreement. Violations to the Agreement will result in no further prescriptions. (Additional copies of our Medication Agreement are available upon request.) 9. Laws, Rules, & Regulations: All patients are expected to follow all Federal and State Laws, Statutes, Rules, & Regulations. Ignorance of the Laws does not constitute a valid excuse. The use of any illegal substances is prohibited. 10. Adopted CDC guidelines & recommendations: Target dosing levels will be at or below 60 MME/day. Use of benzodiazepines** is not  recommended.  Exceptions: There are only two exceptions to the rule of not receiving pain medications from other Healthcare Providers. 1.   Exception #1 (Emergencies): In the event of an emergency (i.e.: accident requiring emergency care), you are allowed to receive additional pain medication. However, you are responsible for: As soon as you are able, call our office [phone], at any time of the day or night, and leave a message stating your name, the date and nature of the emergency, and the name and dose of the medication prescribed. In the event that your call is answered by a member of our staff, make sure to document and save the date, time, and the name of the person that took your information.  2. Exception #2 (Planned Surgery): In the event that you are scheduled by another doctor or dentist to have any type of surgery or procedure, you are allowed (for a period no longer than 30 days), to receive additional pain medication, for the acute post-op pain. However, in this case, you are responsible for picking up a copy of our "Post-op Pain Management for Surgeons" handout, and giving it to your surgeon or dentist. This document is available at our office, and does not require an appointment to obtain it. Simply go to our office during business hours (Monday-Thursday from 8:00 AM to 4:00 PM) (Friday 8:00 AM to 12:00 Noon) or if you have a scheduled appointment with us, prior to your surgery, and ask for it by name. In addition, you will need to provide us with your name, name of your surgeon, type of surgery, and date of procedure or surgery.  *Opioid medications include: morphine, codeine, oxycodone, oxymorphone, hydrocodone, hydromorphone, meperidine, tramadol, tapentadol, buprenorphine, fentanyl, methadone. **Benzodiazepine medications include: diazepam (Valium), alprazolam (Xanax), clonazepam (Klonopine), lorazepam (Ativan), clorazepate (Tranxene), chlordiazepoxide (Librium), estazolam (Prosom),  oxazepam (Serax), temazepam (Restoril), triazolam (Halcion) (Last updated: 01/28/2018) ____________________________________________________________________________________________    

## 2018-11-04 NOTE — Progress Notes (Signed)
Patient's Name: Amber Acosta  MRN: [id number]  Referring Provider: Sharyne Peach, MD  DOB: 12-15-1950  PCP: Sharyne Peach, MD  DOS: 11/04/2018  Note by: Vevelyn Francois NP  Service setting: Ambulatory outpatient  Specialty: Interventional Pain Management  Location: [facility] (AMB) Pain Management Facility    Patient type: Established    Primary Reason(s) for Visit: Encounter for prescription drug management & post-procedure evaluation of chronic illness with mild to moderate exacerbation(Level of risk: moderate) CC: Neck Pain  HPI  Amber Acosta is a 67 y.o. year old, female patient, who comes today for a post-procedure evaluation and medication management. She has Cervical vertebral fusion (Uncomplicated C5 through C7 ACDF.); Fibromyalgia; Lumbar facet joint pain; Lumbar facet syndrome (Bilateral) (R>L); Stage 3 chronic kidney disease ([place]); COPD (chronic obstructive pulmonary disease) ([place]); Depression; Type 2 diabetes mellitus ([place]); Hypertensive heart disease with congestive heart failure ([place]); Gall bladder polyp; GERD (gastroesophageal reflux disease); Hyperlipemia; Inflammatory polyarthropathy ([place]); Sleep terror; Obesity, Class II, BMI 35-39.9, with comorbidity; OP (osteoporosis); Psoriasis; Kidney cysts; Muscle weakness (generalized); Sarcoidosis; Thrombocytopenia ([place]); Vitamin D deficiency; Chronic neck pain (Secondary source of pain) (Bilateral) (L>R); Cervicogenic headache (Left); Cervical facet syndrome (Left); At risk for falls; Balance problems; History of closed head injury; Opiate use (22.5 MME/Day); Long term current use of opiate analgesic; Encounter for therapeutic drug level monitoring; Long term prescription opiate use; Spondylolisthesis of cervical region; Cervical spine ankylosis; Hx of cervical spine surgery; Low magnesium levels; Myofascial pain; Neuropathy; Benign paroxysmal positional vertigo; Diabetic peripheral neuropathy ([place]); Meralgia paresthetica (Right); Cervical spondylosis;  Chronic diastolic congestive heart failure ([place]); Mixed stress and urge urinary incontinence; Overactive detrusor; 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[place]); Chronic low back pain (Primary Source of Pain) (Bilateral) (R>L); Chronic pain syndrome; Sarcoidosis of lung ([place]); Chronic back pain; Chronic Occipital neuralgia (Left); Epigastric pain; Displacement of cervical intervertebral disc without myelopathy; Essential hypertension; Hypertension; Morbid obesity with BMI of 40.0-44.9, adult ([place]); Recurrent major depressive disorder, in partial remission ([place]); Right arm weakness; Disturbance of skin sensation; Cervical spondylitis with radiculitis ([place]); Chronic shoulder pain [facility] source of pain) (Bilateral) (L>R); Spondylosis without myelopathy or radiculopathy, cervical region; Cervicalgia; Vasovagal episode; Nausea; Hypotension; Bradycardia; Neurogenic pain; and DDD (degenerative disc disease), cervical on their problem list. Her primarily concern today is the Neck Pain  Pain Assessment: Location: Upper(shoulder) Neck Radiating: denies Onset: More than a month ago Duration: Chronic pain Quality: Constant, Sharp, Radiating Severity: 4 /10 (subjective, self-reported pain score)  Note: Reported level is compatible with observation.                          Effect on ADL: unable to much Timing: Constant Modifying factors: medication and lay down BP: (!) 137/58  HR: 65  Amber Acosta was last seen on 10/22/2018 for a procedure. During today's appointment we reviewed Amber Acosta's post-procedure results, as well as her outpatient medication regimen.  Further details on both, my assessment(s), as well as the proposed treatment plan, please see below.  Controlled Substance Pharmacotherapy Assessment REMS (Risk Evaluation and Mitigation Strategy)  Analgesic:Oxycodone IR 5 mg every 8 hours (15 mg/day) MME/day:22.5 mg/day  Chauncey Fischer, RN  11/04/2018 11:35 AM  Sign at close encounter Nursing Pain  Medication Assessment:  Safety precautions to be maintained throughout the outpatient stay will include: orient to surroundings, keep bed in low position, maintain call bell within reach at all times, provide assistance with transfer out of bed and ambulation.  Medication Inspection Compliance: Pill count conducted under aseptic conditions, in front of the patient. Neither the pills nor the bottle was removed from the patient's sight at any time. Once count was completed pills were immediately returned to the patient in their original bottle.  Medication: Oxycodone IR Pill/Patch Count: 78 of 90 pills remain Pill/Patch Appearance: Markings consistent with prescribed medication Bottle Appearance: Standard pharmacy container. Clearly labeled. Filled Date: 74 / 1 / 2019 Last Medication intake:  Today   Pharmacokinetics: Liberation and absorption (onset of action): WNL Distribution (time to peak effect): WNL Metabolism and excretion (duration of action): WNL         Pharmacodynamics: Desired effects: Analgesia: Amber Acosta reports >50% benefit. Functional ability: Patient reports that medication allows her to accomplish basic ADLs Clinically meaningful improvement in function (CMIF): Sustained CMIF goals met Perceived effectiveness: Described as relatively effective, allowing for increase in activities of daily living (ADL) Undesirable effects: Side-effects or Adverse reactions: None reported Monitoring: [place] PMP: Online review of the past 10-monthperiod conducted. Compliant with practice rules and regulations Last UDS on record: Summary  Date Value Ref Range Status  06/17/2018 FINAL  Final    Comment:    ==================================================================== TOXASSURE SELECT 13 (MW) ==================================================================== Test                             Result       Flag       Units Drug Present and Declared for Prescription Verification    Oxycodone                      2096         EXPECTED   ng/mg creat   Noroxycodone                   1362         EXPECTED   ng/mg creat    Sources of oxycodone include scheduled prescription medications.    Noroxycodone is an expected metabolite of oxycodone. ==================================================================== Test                      Result    Flag   Units      Ref Range   Creatinine              125              mg/dL      >=20 ==================================================================== Declared Medications:  The flagging and interpretation on this report are based on the  following declared medications.  Unexpected results may arise from  inaccuracies in the declared medications.  **Note: The testing scope of this panel includes these medications:  Oxycodone  **Note: The testing scope of this panel does not include following  reported medications:  Albuterol  Amlodipine Besylate  Famotidine  Fluoxetine  Fluticasone (Advair)  Furosemide  Hydralazine  Insulin (Humulin)  Losartan (Losartan Potassium)  Lovastatin  Metoprolol  Nitroglycerin  Omeprazole  Potassium  Ranitidine  Salmeterol (Advair)  Tizanidine  Trazodone ==================================================================== For clinical consultation, please call [phone] ====================================================================    UDS interpretation: Compliant          Medication Assessment Form: Reviewed. Patient indicates being compliant with therapy Treatment compliance: Compliant Risk Assessment Profile: Aberrant behavior: See prior evaluations. None observed or detected today Comorbid factors increasing risk of overdose: See prior notes. No additional risks detected today Opioid risk tool (  ORT) (Total Score): 4 Personal History of Substance Abuse (SUD-Substance use disorder):  Alcohol: Negative  Illegal Drugs: Negative  Rx Drugs: Negative  ORT Risk Level  calculation: Moderate Risk Risk of substance use disorder (SUD): Moderate-to-High Opioid Risk Tool - 11/04/18 1143      Family History of Substance Abuse   Alcohol  Positive Female    Illegal Drugs  Negative    Rx Drugs  Negative      Personal History of Substance Abuse   Alcohol  Negative    Illegal Drugs  Negative    Rx Drugs  Negative      Age   Age between 55-45 years   No      History of Preadolescent Sexual Abuse   History of Preadolescent Sexual Abuse  Negative or Female      Psychological Disease   Psychological Disease  Negative    Depression  Positive      Total Score   Opioid Risk Tool Scoring  4    Opioid Risk Interpretation  Moderate Risk      ORT Scoring interpretation table:  Score <3 = Low Risk for SUD  Score between 4-7 = Moderate Risk for SUD  Score >8 = High Risk for Opioid Abuse   Risk Mitigation Strategies:  Patient Counseling: Covered Patient-Prescriber Agreement (PPA): Present and active  Notification to other healthcare providers: Done  Pharmacologic Plan: No change in therapy, at this time.             Post-Procedure Assessment  12/21/2017 procedure: Left-sided greater occipital lobe nerve block Pre-procedure pain score:  1/10 Post-procedure pain score: 0/10         Influential Factors: BMI: 38.62 kg/m Intra-procedural challenges: None observed.         Assessment challenges: None detected.              Reported side-effects: None.        Post-procedural adverse reactions or complications: None reported         Sedation: Please see nurses note. When no sedatives are used, the analgesic levels obtained are directly associated to the effectiveness of the local anesthetics. However, when sedation is provided, the level of analgesia obtained during the initial 1 hour following the intervention, is believed to be the result of a combination of factors. These factors may include, but are not limited to: 1. The effectiveness of the local anesthetics  used. 2. The effects of the analgesic(s) and/or anxiolytic(s) used. 3. The degree of discomfort experienced by the patient at the time of the procedure. 4. The patients ability and reliability in recalling and recording the events. 5. The presence and influence of possible secondary gains and/or psychosocial factors. Reported result: Relief experienced during the 1st hour after the procedure: 100 % (Ultra-Short Term Relief)            Interpretative annotation: Clinically appropriate result. Analgesia during this period is likely to be Local Anesthetic and/or IV Sedative (Analgesic/Anxiolytic) related.          Effects of local anesthetic: The analgesic effects attained during this period are directly associated to the localized infiltration of local anesthetics and therefore cary significant diagnostic value as to the etiological location, or anatomical origin, of the pain. Expected duration of relief is directly dependent on the pharmacodynamics of the local anesthetic used. Long-acting (4-6 hours) anesthetics used.  Reported result: Relief during the next 4 to 6 hour after the procedure: 100 % (Short-Term Relief)  Interpretative annotation: Clinically appropriate result. Analgesia during this period is likely to be Local Anesthetic-related.          Long-term benefit: Defined as the period of time past the expected duration of local anesthetics (1 hour for short-acting and 4-6 hours for long-acting). With the possible exception of prolonged sympathetic blockade from the local anesthetics, benefits during this period are typically attributed to, or associated with, other factors such as analgesic sensory neuropraxia, antiinflammatory effects, or beneficial biochemical changes provided by agents other than the local anesthetics.  Reported result: Extended relief following procedure: 100 % (Long-Term Relief)            Interpretative annotation: Clinically possible results. Good relief. No  permanent benefit expected. Inflammation plays a part in the etiology to the pain.          Current benefits: Defined as reported results that persistent at this point in time.   Analgesia: 100 %            Function: Ms. Hendley reports improvement in function ROM: Ms. Marston reports improvement in ROM Interpretative annotation: Complete relief.                Interpretation: Results would suggest a successful palliative intervention.                  Plan:  Please see "Plan of Care" for details.                Laboratory Chemistry  Inflammation Markers (CRP: Acute Phase) (ESR: Chronic Phase) Lab Results  Component Value Date   CRP 2.1 (H) 03/26/2016   ESRSEDRATE 52 (H) 03/26/2016                         Rheumatology Markers No results found for: RF, ANA, LABURIC, URICUR, LYMEIGGIGMAB, LYMEABIGMQN, HLAB27                      Renal Function Markers Lab Results  Component Value Date   BUN 14 03/26/2016   CREATININE 1.28 (H) 03/26/2016   GFRAA 50 (L) 03/26/2016   GFRNONAA 44 (L) 03/26/2016                             Hepatic Function Markers Lab Results  Component Value Date   AST 24 03/26/2016   ALT 20 03/26/2016   ALBUMIN 4.0 03/26/2016   ALKPHOS 70 03/26/2016                        Electrolytes Lab Results  Component Value Date   NA 136 03/26/2016   K 3.8 03/26/2016   CL 100 (L) 03/26/2016   CALCIUM 8.5 (L) 03/26/2016   MG 2.1 06/22/2017                        Neuropathy Markers Lab Results  Component Value Date   VITAMINB12 628 06/22/2017                        CNS Tests No results found for: COLORCSF, APPEARCSF, RBCCOUNTCSF, WBCCSF, POLYSCSF, LYMPHSCSF, EOSCSF, PROTEINCSF, GLUCCSF, JCVIRUS, CSFOLI, IGGCSF                      Bone Pathology Markers Lab Results  Component Value Date   VD25OH 20.2 (  L) 03/26/2016                         Coagulation Parameters Lab Results  Component Value Date   PLT 146 (L) 03/26/2016                         Cardiovascular Markers No results found for: BNP, CKTOTAL, CKMB, TROPONINI, HGB, HCT                       CA Markers No results found for: CEA, CA125, LABCA2                      Note: Lab results reviewed.  Recent Diagnostic Imaging Results  DG C-Arm 1-60 Min-No Report Fluoroscopy was utilized by the requesting physician.  No radiographic  interpretation.   Complexity Note: Imaging results reviewed. Results shared with Ms. Wierzbicki, using Layman's terms.                         Meds   Current Outpatient Medications:  .  ADVAIR DISKUS 250-50 MCG/DOSE AEPB, , Disp: , Rfl:  .  albuterol (PROVENTIL HFA;VENTOLIN HFA) 108 (90 Base) MCG/ACT inhaler, Inhale 2 puffs into the lungs every 6 (six) hours as needed. , Disp: , Rfl:  .  amLODipine (NORVASC) 10 MG tablet, Take 10 mg by mouth daily. , Disp: , Rfl:  .  B-D INS SYR ULTRAFINE 1CC/30G 30G X 1/2" 1 ML MISC, , Disp: , Rfl:  .  B-D INS SYR ULTRAFINE 1CC/31G 31G X 5/16" 1 ML MISC, , Disp: , Rfl:  .  FLUoxetine (PROZAC) 40 MG capsule, Take 40 mg by mouth daily. , Disp: , Rfl:  .  furosemide (LASIX) 40 MG tablet, Take 40 mg by mouth daily., Disp: , Rfl:  .  gabapentin (NEURONTIN) 400 MG capsule, Take 1 capsule (400 mg total) by mouth 3 (three) times daily., Disp: 90 capsule, Rfl: 2 .  KLOR-CON 10 10 MEQ tablet, Take 10 mEq by mouth daily. , Disp: , Rfl:  .  losartan (COZAAR) 100 MG tablet, TAKE 1 TABLET (100 MG TOTAL) BY MOUTH ONCE DAILY., Disp: , Rfl:  .  lovastatin (MEVACOR) 10 MG tablet, Take 10 mg by mouth at bedtime., Disp: , Rfl:  .  metoprolol succinate (TOPROL-XL) 50 MG 24 hr tablet, Take 25 mg by mouth daily. , Disp: , Rfl:  .  nitroGLYCERIN (NITROSTAT) 0.3 MG SL tablet, 1 tablet sublingual prn for ESOPHAGEAL SPASMS(may repeat every 5 min, seek med help if pain persists after 3 tablets), Disp: , Rfl:  .  omeprazole (PRILOSEC) 40 MG capsule, Take 40 mg by mouth daily., Disp: , Rfl:  .  ranitidine (ZANTAC) 150 MG tablet, Take 150 mg  by mouth 2 (two) times daily., Disp: , Rfl:  .  tiZANidine (ZANAFLEX) 4 MG tablet, Take 1 tablet (4 mg total) by mouth 2 (two) times daily as needed for muscle spasms., Disp: 60 tablet, Rfl: 2 .  traZODone (DESYREL) 100 MG tablet, Take 300 mg by mouth at bedtime. , Disp: , Rfl:  .  hydrALAZINE (APRESOLINE) 25 MG tablet, Take 25 mg by mouth 2 (two) times daily. , Disp: , Rfl:  .  insulin NPH Human (HUMULIN N,NOVOLIN N) 100 UNIT/ML injection, 90 units in the am and 40 units at supper, Disp: , Rfl:  .  [START ON  01/29/2019] oxyCODONE (OXY IR/ROXICODONE) 5 MG immediate release tablet, Take 1 tablet (5 mg total) by mouth every 8 (eight) hours as needed for moderate pain or severe pain., Disp: 90 tablet, Rfl: 0 .  [START ON 12/30/2018] oxyCODONE (OXY IR/ROXICODONE) 5 MG immediate release tablet, Take 1 tablet (5 mg total) by mouth every 8 (eight) hours as needed for moderate pain or severe pain., Disp: 90 tablet, Rfl: 0 .  [START ON 11/30/2018] oxyCODONE (OXY IR/ROXICODONE) 5 MG immediate release tablet, Take 1 tablet (5 mg total) by mouth every 8 (eight) hours as needed for moderate pain or severe pain., Disp: 90 tablet, Rfl: 0  ROS  Constitutional: Denies any fever or chills Gastrointestinal: No reported hemesis, hematochezia, vomiting, or acute GI distress Musculoskeletal: Denies any acute onset joint swelling, redness, loss of ROM, or weakness Neurological: No reported episodes of acute onset apraxia, aphasia, dysarthria, agnosia, amnesia, paralysis, loss of coordination, or loss of consciousness  Allergies  Ms. Ginther is allergic to aspirin; ciprofloxacin; etanercept; metformin; methotrexate; mushroom extract complex; other; darvon [propoxyphene]; minocycline; and cobalamin combinations.  PFSH  Drug: Ms. Gomes  reports that she does not use drugs. Alcohol:  reports that she does not drink alcohol. Tobacco:  reports that she has quit smoking. She has quit using smokeless tobacco. Medical:  has a  past medical history of Arthritis, Cervical spondylosis without myelopathy (09/21/2015), Chronic pain, Chronic pain associated with significant psychosocial dysfunction (08/28/2013), Depression, Diabetes mellitus without complication ([place]), Displacement of cervical intervertebral disc without myelopathy (09/05/2015), Fibromyalgia, GERD (gastroesophageal reflux disease), Hypertension, Kidney disease, chronic, stage III (GFR 30-59 ml/min) ([place]), Sarcoid, and Thrombopenia ([place]). Surgical: Ms. Acri  has a past surgical history that includes neck fusion; Breast surgery; Back surgery; Knee arthroscopy; Fracture surgery; Hand surgery; Abdominal hysterectomy; and Cyst excision. Family: family history includes Alcohol abuse in her brother; Alzheimer's disease in her mother; COPD in her father; Diabetes in her father; Heart disease in her father.  Constitutional Exam  General appearance: Well nourished, well developed, and well hydrated. In no apparent acute distress Vitals:   11/04/18 1136  BP: (!) 137/58  Pulse: 65  Temp: 97.9 F (36.6 C)  SpO2: 100%  Weight: 225 lb (102.1 kg)  Height: '5\' 4"'$  (1.626 m)  Psych/Mental status: Alert, oriented x 3 (person, place, & time)       Eyes: PERLA Respiratory: No evidence of acute respiratory distress  Cervical Spine Area Exam  Skin & Axial Inspection: No masses, redness, edema, swelling, or associated skin lesions Alignment: Symmetrical Functional ROM: Unrestricted ROM      Stability: No instability detected Muscle Tone/Strength: Functionally intact. No obvious neuro-muscular anomalies detected. Sensory (Neurological): Unimpaired Palpation: No palpable anomalies              Upper Extremity (UE) Exam    Side: Right upper extremity  Side: Left upper extremity  Skin & Extremity Inspection: Skin color, temperature, and hair growth are WNL. No peripheral edema or cyanosis. No masses, redness, swelling, asymmetry, or associated skin lesions. No contractures.   Skin & Extremity Inspection: Skin color, temperature, and hair growth are WNL. No peripheral edema or cyanosis. No masses, redness, swelling, asymmetry, or associated skin lesions. No contractures.  Functional ROM: Unrestricted ROM          Functional ROM: Unrestricted ROM          Muscle Tone/Strength: Functionally intact. No obvious neuro-muscular anomalies detected.  Muscle Tone/Strength: Functionally intact. No obvious neuro-muscular anomalies detected.  Sensory (Neurological): Unimpaired  Sensory (Neurological): Unimpaired          Palpation: No palpable anomalies              Palpation: No palpable anomalies                   Thoracic Spine Area Exam  Skin & Axial Inspection: No masses, redness, or swelling Alignment: Symmetrical Functional ROM: Unrestricted ROM Stability: No instability detected Muscle Tone/Strength: Functionally intact. No obvious neuro-muscular anomalies detected. Sensory (Neurological): Unimpaired Muscle strength & Tone: No palpable anomalies  Lumbar Spine Area Exam  Skin & Axial Inspection: No masses, redness, or swelling Alignment: Symmetrical Functional ROM: Unrestricted ROM       Stability: No instability detected Muscle Tone/Strength: Functionally intact. No obvious neuro-muscular anomalies detected. Sensory (Neurological): Unimpaired Palpation: No palpable anomalies       Provocative Tests: Hyperextension/rotation test: deferred today       Lumbar quadrant test (Kemp's test): deferred today       Lateral bending test: deferred today       Patrick's Maneuver: deferred today                   FABER test: deferred today                   S-I anterior distraction/compression test: deferred today         S-I lateral compression test: deferred today         S-I Thigh-thrust test: deferred today         S-I Gaenslen's test: deferred today          Gait & Posture Assessment  Ambulation: Unassisted Gait: Relatively normal for age and body  habitus Posture: WNL   Lower Extremity Exam    Side: Right lower extremity  Side: Left lower extremity  Stability: No instability observed          Stability: No instability observed          Skin & Extremity Inspection: Skin color, temperature, and hair growth are WNL. No peripheral edema or cyanosis. No masses, redness, swelling, asymmetry, or associated skin lesions. No contractures.  Skin & Extremity Inspection: Skin color, temperature, and hair growth are WNL. No peripheral edema or cyanosis. No masses, redness, swelling, asymmetry, or associated skin lesions. No contractures.  Functional ROM: Unrestricted ROM                  Functional ROM: Unrestricted ROM                  Muscle Tone/Strength: Functionally intact. No obvious neuro-muscular anomalies detected.  Muscle Tone/Strength: Functionally intact. No obvious neuro-muscular anomalies detected.  Sensory (Neurological): Unimpaired        Sensory (Neurological): Unimpaired            Palpation: No palpable anomalies  Palpation: No palpable anomalies   Assessment  Primary Diagnosis & Pertinent Problem List: The primary encounter diagnosis was Chronic Occipital neuralgia (Left). Diagnoses of Cervical spondylosis, Lumbar facet syndrome (Bilateral) (R>L), Lumbar facet joint pain, Myofascial pain, Inflammatory polyarthropathy ([place]), and Chronic pain syndrome were also pertinent to this visit.  Status Diagnosis  Controlled Controlled Controlled 1. Chronic Occipital neuralgia (Left)   2. Cervical spondylosis   3. Lumbar facet syndrome (Bilateral) (R>L)   4. Lumbar facet joint pain   5. Myofascial pain   6. Inflammatory polyarthropathy ([place])   7. Chronic pain syndrome     Problems updated and  reviewed during this visit: No problems updated. Plan of Care  Pharmacotherapy (Medications Ordered): Meds ordered this encounter  Medications  . gabapentin (NEURONTIN) 400 MG capsule    Sig: Take 1 capsule (400 mg total) by mouth 3  (three) times daily.    Dispense:  90 capsule    Refill:  2    Order Specific Question:   Supervising Provider    Answer:   Milinda Pointer [phone]  . tiZANidine (ZANAFLEX) 4 MG tablet    Sig: Take 1 tablet (4 mg total) by mouth 2 (two) times daily as needed for muscle spasms.    Dispense:  60 tablet    Refill:  2    Do not place this medication, or any other prescription from our practice, on "Automatic Refill". Patient may have prescription filled one day early if pharmacy is closed on scheduled refill date.    Order Specific Question:   Supervising Provider    Answer:   Milinda Pointer [phone]  . oxyCODONE (OXY IR/ROXICODONE) 5 MG immediate release tablet    Sig: Take 1 tablet (5 mg total) by mouth every 8 (eight) hours as needed for moderate pain or severe pain.    Dispense:  90 tablet    Refill:  0    Do not place this medication, or any other prescription from our practice, on "Automatic Refill". Patient may have prescription filled one day early if pharmacy is closed on scheduled refill date.    Order Specific Question:   Supervising Provider    Answer:   Milinda Pointer [phone]  . oxyCODONE (OXY IR/ROXICODONE) 5 MG immediate release tablet    Sig: Take 1 tablet (5 mg total) by mouth every 8 (eight) hours as needed for moderate pain or severe pain.    Dispense:  90 tablet    Refill:  0    Do not place this medication, or any other prescription from our practice, on "Automatic Refill". Patient may have prescription filled one day early if pharmacy is closed on scheduled refill date.    Order Specific Question:   Supervising Provider    Answer:   Milinda Pointer [phone]  . oxyCODONE (OXY IR/ROXICODONE) 5 MG immediate release tablet    Sig: Take 1 tablet (5 mg total) by mouth every 8 (eight) hours as needed for moderate pain or severe pain.    Dispense:  90 tablet    Refill:  0    Do not place this medication, or any other prescription from our practice, on  "Automatic Refill". Patient may have prescription filled one day early if pharmacy is closed on scheduled refill date.    Order Specific Question:   Supervising Provider    Answer:   Milinda Pointer [phone]   New Prescriptions   No medications on file   Medications administered today: Jamiaya Faires had no medications administered during this visit. Lab-work, procedure(s), and/or referral(s): No orders of the defined types were placed in this encounter.  Imaging and/or referral(s): None  Interventional management options: Planned, scheduled, and/or pending: None at this time.   Considering: Therapeutic left-sided greater occipital nerve block #3    Palliative PRN treatment(s): NOTE: NO RFA (Poor candidate due to delayed sensory perception and intra-procedural non-compliance.)  Palliative bilateral lumbar facet block (NO RFA) (for low back pain)  Palliativeleft cervical facet block(NO RFA) (for left-sided neck pain)  Palliative left-sided greater occipital nerve block (NO RFA) (for left-sided headaches)     Provider-requested follow-up: Return in about 3  months (around 02/03/2019) for MedMgmt.  Future Appointments  Date Time Provider [place]  02/08/2019 10:30 AM Vevelyn Francois, NP [facility] None   Primary Care Physician: Sharyne Peach, MD Location: [facility] Note by: Vevelyn Francois NP Date: 11/04/2018; Time: 1:43 PM  Pain Score Disclaimer: We use the NRS-11 scale. This is a self-reported, subjective measurement of pain severity with only modest accuracy. It is used primarily to identify changes within a particular patient. It must be understood that outpatient pain scales are significantly less accurate that those used for research, where they can be applied under ideal controlled circumstances with minimal exposure to variables. In reality, the score is likely to be a combination of pain intensity and pain affect, where  pain affect describes the degree of emotional arousal or changes in action readiness caused by the sensory experience of pain. Factors such as social and work situation, setting, emotional state, anxiety levels, expectation, and prior pain experience may influence pain perception and show large inter-individual differences that may also be affected by time variables.  Patient instructions provided during this appointment: Patient Instructions  ____________________________________________________________________________________________  Medication Rules  Purpose: To inform patients, and their family members, of our rules and regulations.  Applies to: All patients receiving prescriptions (written or electronic).  Pharmacy of record: Pharmacy where electronic prescriptions will be sent. If written prescriptions are taken to a different pharmacy, please inform the nursing staff. The pharmacy listed in the electronic medical record should be the one where you would like electronic prescriptions to be sent.  Electronic prescriptions: In compliance with the [place] (STOP) Act of 2017 (Session Lanny Cramp [phone]), effective December 01, 2018, all controlled substances must be electronically prescribed. Calling prescriptions to the pharmacy will cease to exist.  Prescription refills: Only during scheduled appointments. Applies to all prescriptions.  NOTE: The following applies primarily to controlled substances (Opioid* Pain Medications).   Patient's responsibilities: 1. Pain Pills: Bring all pain pills to every appointment (except for procedure appointments). 2. Pill Bottles: Bring pills in original pharmacy bottle. Always bring the newest bottle. Bring bottle, even if empty. 3. Medication refills: You are responsible for knowing and keeping track of what medications you take and those you need refilled. The day before your appointment: write a list of all  prescriptions that need to be refilled. The day of the appointment: give the list to the admitting nurse. Prescriptions will be written only during appointments. If you forget a medication: it will not be "Called in", "Faxed", or "electronically sent". You will need to get another appointment to get these prescribed. No early refills. Do not call asking to have your prescription filled early. 4. Prescription Accuracy: You are responsible for carefully inspecting your prescriptions before leaving our office. Have the discharge nurse carefully go over each prescription with you, before taking them home. Make sure that your name is accurately spelled, that your address is correct. Check the name and dose of your medication to make sure it is accurate. Check the number of pills, and the written instructions to make sure they are clear and accurate. Make sure that you are given enough medication to last until your next medication refill appointment. 5. Taking Medication: Take medication as prescribed. When it comes to controlled substances, taking less pills or less frequently than prescribed is permitted and encouraged. Never take more pills than instructed. Never take medication more frequently than prescribed.  6. Inform other Doctors: Always  inform, all of your healthcare providers, of all the medications you take. 7. Pain Medication from other Providers: You are not allowed to accept any additional pain medication from any other Doctor or Healthcare provider. There are two exceptions to this rule. (see below) In the event that you require additional pain medication, you are responsible for notifying us, as stated below. 8. Medication Agreement: You are responsible for carefully reading and following our Medication Agreement. This must be signed before receiving any prescriptions from our practice. Safely store a copy of your signed Agreement. Violations to the Agreement will result in no further  prescriptions. (Additional copies of our Medication Agreement are available upon request.) 9. Laws, Rules, & Regulations: All patients are expected to follow all Federal and [employer], Rules, [employer]. Ignorance of the Laws does not constitute a valid excuse. The use of any illegal substances is prohibited. 10. Adopted CDC guidelines & recommendations: Target dosing levels will be at or below 60 MME/day. Use of benzodiazepines** is not recommended.  Exceptions: There are only two exceptions to the rule of not receiving pain medications from other Healthcare Providers. 1. Exception #1 (Emergencies): In the event of an emergency (i.e.: accident requiring emergency care), you are allowed to receive additional pain medication. However, you are responsible for: As soon as you are able, call our office [phone], at any time of the day or night, and leave a message stating your name, the date and nature of the emergency, and the name and dose of the medication prescribed. In the event that your call is answered by a member of our staff, make sure to document and save the date, time, and the name of the person that took your information.  2. Exception #2 (Planned Surgery): In the event that you are scheduled by another doctor or dentist to have any type of surgery or procedure, you are allowed (for a period no longer than 30 days), to receive additional pain medication, for the acute post-op pain. However, in this case, you are responsible for picking up a copy of our "Post-op Pain Management for Surgeons" handout, and giving it to your surgeon or dentist. This document is available at our office, and does not require an appointment to obtain it. Simply go to our office during business hours (Monday-Thursday from 8:00 AM to 4:00 PM) (Friday 8:00 AM to 12:00 Noon) or if you have a scheduled appointment with [place], prior to your surgery, and ask for it by name. In addition, you will need to provide [place]  with your name, name of your surgeon, type of surgery, and date of procedure or surgery.  *Opioid medications include: morphine, codeine, oxycodone, oxymorphone, hydrocodone, hydromorphone, meperidine, tramadol, tapentadol, buprenorphine, fentanyl, methadone. **Benzodiazepine medications include: diazepam (Valium), alprazolam (Xanax), clonazepam (Klonopine), lorazepam (Ativan), clorazepate (Tranxene), chlordiazepoxide (Librium), estazolam (Prosom), oxazepam (Serax), temazepam (Restoril), triazolam (Halcion) (Last updated: 01/28/2018) ____________________________________________________________________________________________

## 2018-11-09 DIAGNOSIS — L718 Other rosacea: Secondary | ICD-10-CM | POA: Insufficient documentation

## 2019-01-18 ENCOUNTER — Ambulatory Visit
Admission: RE | Admit: 2019-01-18 | Discharge: 2019-01-18 | Disposition: A | Discharge status: Home / Self Care | Payer: Medicare Other | Source: Ambulatory Visit | Attending: Pain Medicine | Admitting: Pain Medicine

## 2019-01-18 ENCOUNTER — Other Ambulatory Visit: Payer: Self-pay

## 2019-01-18 ENCOUNTER — Ambulatory Visit (HOSPITAL_BASED_OUTPATIENT_CLINIC_OR_DEPARTMENT_OTHER): Payer: Medicare Other | Admitting: Pain Medicine

## 2019-01-18 ENCOUNTER — Encounter: Payer: Self-pay | Admitting: Pain Medicine

## 2019-01-18 VITALS — BP 149/74 | HR 72 | Temp 97.3°F | Resp 17 | Ht 63.0 in | Wt 225.0 lb

## 2019-01-18 DIAGNOSIS — M5136 Other intervertebral disc degeneration, lumbar region: Secondary | ICD-10-CM | POA: Insufficient documentation

## 2019-01-18 DIAGNOSIS — G8929 Other chronic pain: Secondary | ICD-10-CM | POA: Insufficient documentation

## 2019-01-18 DIAGNOSIS — M47817 Spondylosis without myelopathy or radiculopathy, lumbosacral region: Secondary | ICD-10-CM | POA: Diagnosis present

## 2019-01-18 DIAGNOSIS — M545 Low back pain: Secondary | ICD-10-CM | POA: Insufficient documentation

## 2019-01-18 DIAGNOSIS — M51369 Other intervertebral disc degeneration, lumbar region without mention of lumbar back pain or lower extremity pain: Secondary | ICD-10-CM | POA: Insufficient documentation

## 2019-01-18 DIAGNOSIS — M47816 Spondylosis without myelopathy or radiculopathy, lumbar region: Secondary | ICD-10-CM | POA: Insufficient documentation

## 2019-01-18 DIAGNOSIS — M5459 Other low back pain: Secondary | ICD-10-CM

## 2019-01-18 MED ORDER — FENTANYL CITRATE (PF) 100 MCG/2ML IJ SOLN
25.0000 ug | INTRAMUSCULAR | Status: DC | PRN
  Administered 2019-01-18: 100 ug via INTRAVENOUS
  Filled 2019-01-18: qty 2

## 2019-01-18 MED ORDER — ROPIVACAINE HCL 2 MG/ML IJ SOLN
18.0000 mL | Freq: Once | INTRAMUSCULAR | Status: AC
  Administered 2019-01-18: 18 mL via PERINEURAL
  Filled 2019-01-18: qty 20

## 2019-01-18 MED ORDER — MIDAZOLAM HCL 5 MG/5ML IJ SOLN
1.0000 mg | INTRAMUSCULAR | Status: DC | PRN
  Administered 2019-01-18: 3 mg via INTRAVENOUS
  Filled 2019-01-18: qty 5

## 2019-01-18 MED ORDER — LACTATED RINGERS IV SOLN
1000.0000 mL | Freq: Once | INTRAVENOUS | Status: AC
  Administered 2019-01-18: 1000 mL via INTRAVENOUS

## 2019-01-18 MED ORDER — TRIAMCINOLONE ACETONIDE 40 MG/ML IJ SUSP
80.0000 mg | Freq: Once | INTRAMUSCULAR | Status: AC
  Administered 2019-01-18: 80 mg
  Filled 2019-01-18: qty 2

## 2019-01-18 MED ORDER — LIDOCAINE HCL 2 % IJ SOLN
20.0000 mL | Freq: Once | INTRAMUSCULAR | Status: AC
  Administered 2019-01-18: 400 mg
  Filled 2019-01-18: qty 40

## 2019-01-18 NOTE — Patient Instructions (Signed)

## 2019-01-18 NOTE — Progress Notes (Signed)
Patient's Name: Amber Acosta  MRN: [id number]  Referring Provider: Delano Metz, MD  DOB: 02-07-1951  PCP: Rayetta Humphrey, MD  DOS: 01/18/2019  Note by: Oswaldo Done, MD  Service setting: Ambulatory outpatient  Specialty: Interventional Pain Management  Patient type: Established  Location: [facility] (AMB) Pain Management Facility  Visit type: Interventional Procedure   Primary Reason for Visit: Interventional Pain Management Treatment. CC: Back Pain (Low Back pain radiates across)  Procedure:          Anesthesia, Analgesia, Anxiolysis:  Type: Lumbar Facet, Medial Branch Block(s) #2  Primary Purpose: Diagnostic Region: Posterolateral Lumbosacral Spine Level: L2, L3, L4, L5, & S1 Medial Branch Level(s). Injecting these levels blocks the L3-4, L4-5, and L5-S1 lumbar facet joints. Laterality: Bilateral  Type: Moderate (Conscious) Sedation combined with Local Anesthesia Indication(s): Analgesia and Anxiety Route: Intravenous (IV) IV Access: Secured Sedation: Meaningful verbal contact was maintained at all times during the procedure  Local Anesthetic: Lidocaine 1-2%  Position: Prone   Indications: 1. Spondylosis without myelopathy or radiculopathy, lumbosacral region   2. Lumbar facet syndrome (Bilateral) (R>L)   3. Lumbar facet joint pain   4. DDD (degenerative disc disease), lumbar   5. Chronic low back pain (Primary Source of Pain) (Bilateral) (R>L)   6. Severe obesity (BMI 35.0-39.9) with comorbidity (HCC)    Pain Score: Pre-procedure: 6 /10 Post-procedure: 0-No pain/10  The patient refers having a flareup of her low back pain secondary to a recent fall.  She indicates that lately she has been having recurrent falls that are associated with dizziness that she experiences when she looks up.  She indicates that they have been looking at possible causes of this but they cannot find what is causing it.  This seems to be transient and it is associated to hyperextension of the  cervical spine.  She indicates that it makes no difference whether she does this movement quickly or slowly.  She also indicates that it does not happen when she looks down or she shakes her head quickly, suggesting that this may not be secondary to inner ear problems.  I asked the patient if somebody had looked at her vertebral arteries and she indicates that she does not think so.  Vertebrobasilar insufficiency (VBI) or cerebral vascular ischemia can present itself with symptoms of vertigo (dizziness), visual disturbances (blurring, grain, double vision), sudden falls, numbness or tingling, slurred or lost speech, confusion, and possible issues with swallowing.  Patients with vertebrobasilar disease are at increased risk of transient ischemic attacks (TIAs) and stroke.  This patient has several increased factors for VBI, including diabetes, hypertension, high cholesterol, and obesity.  I have informed the patient of this and recommended that she have her primary care physician or a neurologist look into this.  Typically the diagnosis is made by MRI angiography of the head and cervical spine.  Pre-op Assessment:  Amber Acosta is a 68 y.o. (year old), female patient, seen today for interventional treatment. She  has a past surgical history that includes neck fusion; Breast surgery; Back surgery; Knee arthroscopy; Fracture surgery; Hand surgery; Abdominal hysterectomy; and Cyst excision. Ms. Dunn has a current medication list which includes the following prescription(s): advair diskus, albuterol, amlodipine, b-d ins syr ultrafine 1cc/30g, b-d ins syr ultrafine 1cc/31g, fluoxetine, furosemide, gabapentin, klor-con 10, losartan, lovastatin, metoprolol succinate, nitroglycerin, omeprazole, oxycodone, oxycodone, tizanidine, trazodone, hydralazine, insulin nph human, oxycodone, and ranitidine, and the following Facility-Administered Medications: fentanyl and midazolam. Her primarily concern today is the Back Pain (Low  Back pain radiates across)  Initial Vital Signs:  Pulse/HCG Rate: 72ECG Heart Rate: 66 Temp: 98 F (36.7 C) Resp: 18 BP: (!) 138/51 SpO2: 98 %  BMI: Estimated body mass index is 39.86 kg/m as calculated from the following:   Height as of this encounter: 5\' 3"  (1.6 m).   Weight as of this encounter: 225 lb (102.1 kg).  Risk Assessment: Allergies: Reviewed. She is allergic to aspirin; ciprofloxacin; etanercept; metformin; methotrexate; mushroom extract complex; other; darvon [propoxyphene]; minocycline; and cobalamin combinations.  Allergy Precautions: None required Coagulopathies: Reviewed. None identified.  Blood-thinner therapy: None at this time Active Infection(s): Reviewed. None identified. Amber Acosta is afebrile  Site Confirmation: [name] was asked to confirm the procedure and laterality before marking the site Procedure checklist: Completed Consent: Before the procedure and under the influence of no sedative(s), amnesic(s), or anxiolytics, the patient was informed of the treatment options, risks and possible complications. To fulfill our ethical and legal obligations, as recommended by the American Medical Association's Code of Ethics, I have informed the patient of my clinical impression; the nature and purpose of the treatment or procedure; the risks, benefits, and possible complications of the intervention; the alternatives, including doing nothing; the risk(s) and benefit(s) of the alternative treatment(s) or procedure(s); and the risk(s) and benefit(s) of doing nothing. The patient was provided information about the general risks and possible complications associated with the procedure. These may include, but are not limited to: failure to achieve desired goals, infection, bleeding, organ or nerve damage, allergic reactions, paralysis, and death. In addition, the patient was informed of those risks and complications associated to Spine-related procedures, such as failure to  decrease pain; infection (i.e.: Meningitis, epidural or intraspinal abscess); bleeding (i.e.: epidural hematoma, subarachnoid hemorrhage, or any other type of intraspinal or peri-dural bleeding); organ or nerve damage (i.e.: Any type of peripheral nerve, nerve root, or spinal cord injury) with subsequent damage to sensory, motor, and/or autonomic systems, resulting in permanent pain, numbness, and/or weakness of one or several areas of the body; allergic reactions; (i.e.: anaphylactic reaction); and/or death. Furthermore, the patient was informed of those risks and complications associated with the medications. These include, but are not limited to: allergic reactions (i.e.: anaphylactic or anaphylactoid reaction(s)); adrenal axis suppression; blood sugar elevation that in diabetics may result in ketoacidosis or comma; water retention that in patients with history of congestive heart failure may result in shortness of breath, pulmonary edema, and decompensation with resultant heart failure; weight gain; swelling or edema; medication-induced neural toxicity; particulate matter embolism and blood vessel occlusion with resultant organ, and/or nervous system infarction; and/or aseptic necrosis of one or more joints. Finally, the patient was informed that Medicine is not an exact science; therefore, there is also the possibility of unforeseen or unpredictable risks and/or possible complications that may result in a catastrophic outcome. The patient indicated having understood very clearly. We have given the patient no guarantees and we have made no promises. Enough time was given to the patient to ask questions, all of which were answered to the patient's satisfaction. Ms. Bernstein has indicated that she wanted to continue with the procedure. Attestation: I, the ordering provider, attest that I have discussed with the patient the benefits, risks, side-effects, alternatives, likelihood of achieving goals, and potential  problems during recovery for the procedure that I have provided informed consent. Date  Time: 01/18/2019 12:51 PM  Pre-Procedure Preparation:  Monitoring: As per clinic protocol. Respiration, ETCO2, SpO2, BP, heart rate and rhythm monitor  placed and checked for adequate function Safety Precautions: Patient was assessed for positional comfort and pressure points before starting the procedure. Time-out: I initiated and conducted the "Time-out" before starting the procedure, as per protocol. The patient was asked to participate by confirming the accuracy of the "Time Out" information. Verification of the correct person, site, and procedure were performed and confirmed by me, the nursing staff, and the patient. "Time-out" conducted as per Joint Commission's Universal Protocol ([email or web address].01.01.01). Time: 1339  Description of Procedure:          Laterality: Bilateral. The procedure was performed in identical fashion on both sides. Levels:  L2, L3, L4, L5, & S1 Medial Branch Level(s) Area Prepped: Posterior Lumbosacral Region Prepping solution: ChloraPrep (2% chlorhexidine gluconate and 70% isopropyl alcohol) Safety Precautions: Aspiration looking for blood return was conducted prior to all injections. At no point did we inject any substances, as a needle was being advanced. Before injecting, the patient was told to immediately notify me if she was experiencing any new onset of "ringing in the ears, or metallic taste in the mouth". No attempts were made at seeking any paresthesias. Safe injection practices and needle disposal techniques used. Medications properly checked for expiration dates. SDV (single dose vial) medications used. After the completion of the procedure, all disposable equipment used was discarded in the proper designated medical waste containers. Local Anesthesia: Protocol guidelines were followed. The patient was positioned over the fluoroscopy table. The area was prepped in the usual manner. The  time-out was completed. The target area was identified using fluoroscopy. A 12-in long, straight, sterile hemostat was used with fluoroscopic guidance to locate the targets for each level blocked. Once located, the skin was marked with an approved surgical skin marker. Once all sites were marked, the skin (epidermis, dermis, and hypodermis), as well as deeper tissues (fat, connective tissue and muscle) were infiltrated with a small amount of a short-acting local anesthetic, loaded on a 10cc syringe with a 25G, 1.5-in  Needle. An appropriate amount of time was allowed for local anesthetics to take effect before proceeding to the next step. Local Anesthetic: Lidocaine 2.0% The unused portion of the local anesthetic was discarded in the proper designated containers. Technical explanation of process:  L2 Medial Branch Nerve Block (MBB): The target area for the L2 medial branch is at the junction of the postero-lateral aspect of the superior articular process and the superior, posterior, and medial edge of the transverse process of L3. Under fluoroscopic guidance, a Quincke needle was inserted until contact was made with os over the superior postero-lateral aspect of the pedicular shadow (target area). After negative aspiration for blood, 0.5 mL of the nerve block solution was injected without difficulty or complication. The needle was removed intact. L3 Medial Branch Nerve Block (MBB): The target area for the L3 medial branch is at the junction of the postero-lateral aspect of the superior articular process and the superior, posterior, and medial edge of the transverse process of L4. Under fluoroscopic guidance, a Quincke needle was inserted until contact was made with os over the superior postero-lateral aspect of the pedicular shadow (target area). After negative aspiration for blood, 0.5 mL of the nerve block solution was injected without difficulty or complication. The needle was removed intact. L4 Medial  Branch Nerve Block (MBB): The target area for the L4 medial branch is at the junction of the postero-lateral aspect of the superior articular process and the superior, posterior, and medial edge of the transverse process of  L5. Under fluoroscopic guidance, a Quincke needle was inserted until contact was made with os over the superior postero-lateral aspect of the pedicular shadow (target area). After negative aspiration for blood, 0.5 mL of the nerve block solution was injected without difficulty or complication. The needle was removed intact. L5 Medial Branch Nerve Block (MBB): The target area for the L5 medial branch is at the junction of the postero-lateral aspect of the superior articular process and the superior, posterior, and medial edge of the sacral ala. Under fluoroscopic guidance, a Quincke needle was inserted until contact was made with os over the superior postero-lateral aspect of the pedicular shadow (target area). After negative aspiration for blood, 0.5 mL of the nerve block solution was injected without difficulty or complication. The needle was removed intact. S1 Medial Branch Nerve Block (MBB): The target area for the S1 medial branch is at the posterior and inferior 6 o'clock position of the L5-S1 facet joint. Under fluoroscopic guidance, the Quincke needle inserted for the L5 MBB was redirected until contact was made with os over the inferior and postero aspect of the sacrum, at the 6 o' clock position under the L5-S1 facet joint (Target area). After negative aspiration for blood, 0.5 mL of the nerve block solution was injected without difficulty or complication. The needle was removed intact.  Nerve block solution: 0.2% PF-Ropivacaine + Triamcinolone (40 mg/mL) diluted to a final concentration of 4 mg of Triamcinolone/mL of Ropivacaine The unused portion of the solution was discarded in the proper designated containers. Procedural Needles: 22-gauge, 3.5-inch, Quincke needles used for all  levels.  Once the entire procedure was completed, the treated area was cleaned, making sure to leave some of the prepping solution back to take advantage of its long term bactericidal properties.   Illustration of the posterior view of the lumbar spine and the posterior neural structures. Laminae of L2 through S1 are labeled. DPRL5, dorsal primary ramus of L5; DPRS1, dorsal primary ramus of S1; DPR3, dorsal primary ramus of L3; FJ, facet (zygapophyseal) joint L3-L4; I, inferior articular process of L4; LB1, lateral branch of dorsal primary ramus of L1; IAB, inferior articular branches from L3 medial branch (supplies L4-L5 facet joint); IBP, intermediate branch plexus; MB3, medial branch of dorsal primary ramus of L3; NR3, third lumbar nerve root; S, superior articular process of L5; SAB, superior articular branches from L4 (supplies L4-5 facet joint also); TP3, transverse process of L3.  Vitals:   01/18/19 1352 01/18/19 1402 01/18/19 1412 01/18/19 1422  BP: (!) 118/56 (!) 142/63 (!) 166/78 (!) 149/74  Pulse:      Resp: 20 20 13 17   Temp:  (!) 97.4 F (36.3 C)  (!) 97.3 F (36.3 C)  SpO2: 95% 97% 95% 97%  Weight:      Height:         Start Time: 1339 hrs. End Time: 1351 hrs.  Imaging Guidance (Spinal):          Type of Imaging Technique: Fluoroscopy Guidance (Spinal) Indication(s): Assistance in needle guidance and placement for procedures requiring needle placement in or near specific anatomical locations not easily accessible without such assistance. Exposure Time: Please see nurses notes. Contrast: None used. Fluoroscopic Guidance: I was personally present during the use of fluoroscopy. "Tunnel Vision Technique" used to obtain the best possible view of the target area. Parallax error corrected before commencing the procedure. "Direction-depth-direction" technique used to introduce the needle under continuous pulsed fluoroscopy. Once target was reached, antero-posterior, oblique, and  lateral  fluoroscopic projection used confirm needle placement in all planes. Images permanently stored in EMR. Interpretation: No contrast injected. I personally interpreted the imaging intraoperatively. Adequate needle placement confirmed in multiple planes. Permanent images saved into the patient's record.  Antibiotic Prophylaxis:   Anti-infectives (From admission, onward)   None     Indication(s): None identified  Post-operative Assessment:  Post-procedure Vital Signs:  Pulse/HCG Rate: 7269 Temp: (!) 97.3 F (36.3 C) Resp: 17 BP: (!) 149/74 SpO2: 97 %  EBL: None  Complications: No immediate post-treatment complications observed by team, or reported by patient.  Note: The patient tolerated the entire procedure well. A repeat set of vitals were taken after the procedure and the patient was kept under observation following institutional policy, for this type of procedure. Post-procedural neurological assessment was performed, showing return to baseline, prior to discharge. The patient was provided with post-procedure discharge instructions, including a section on how to identify potential problems. Should any problems arise concerning this procedure, the patient was given instructions to immediately contact us, at any time, without hesitation. In any case, we plan to contact the patient by telephone for a follow-up status report regarding this interventional procedure.  Comments:  No additional relevant information.  Plan of Care  Interventional management options: Planned, scheduled, and/or pending: NOTE: NO RFA (Poor candidate due to delayed sensory perception and intra-procedural non-compliance.)  No additional procedures planned at this time.  Medication management by Thad Ranger, NP, under my supervision.  We will continue to encourage the patient to lower her BMI below 30.   Considering: No additional procedures planned at this time.    Palliative PRN  treatment(s): Palliative bilateral lumbar facet block (NO RFA) (for low back pain)  Palliativeleft cervical facet block(NO RFA) (for left-sided neck pain)  Palliative left-sided greater occipital nerve block (NO RFA) (for left-sided headaches)     Imaging Orders     DG C-Arm 1-60 Min-No Report  Procedure Orders     LUMBAR FACET(MEDIAL BRANCH NERVE BLOCK) MBNB Medications ordered for procedure: Meds ordered this encounter  Medications  . lidocaine (XYLOCAINE) 2 % (with pres) injection 400 mg  . midazolam (VERSED) 5 MG/5ML injection 1-2 mg    Make sure Flumazenil is available in the pyxis when using this medication. If oversedation occurs, administer 0.2 mg IV over 15 sec. If after 45 sec no response, administer 0.2 mg again over 1 min; may repeat at 1 min intervals; not to exceed 4 doses (1 mg)  . fentaNYL (SUBLIMAZE) injection 25-50 mcg    Make sure Narcan is available in the pyxis when using this medication. In the event of respiratory depression (RR< 8/min): Titrate NARCAN (naloxone) in increments of 0.1 to 0.2 mg IV at 2-3 minute intervals, until desired degree of reversal.  . lactated ringers infusion 1,000 mL  . ropivacaine (PF) 2 mg/mL (0.2%) (NAROPIN) injection 18 mL  . triamcinolone acetonide (KENALOG-40) injection 80 mg   Medications administered: We administered lidocaine, midazolam, fentaNYL, lactated ringers, ropivacaine (PF) 2 mg/mL (0.2%), and triamcinolone acetonide.  See the medical record for exact dosing, route, and time of administration.  Disposition: Discharge home  Discharge Date & Time: 01/18/2019; 1425 hrs.   Physician-requested Follow-up: Return for PPE (2 wks), w/ Thad Ranger, NP.  Future Appointments  Date Time Provider Department Center  02/08/2019 10:30 AM Barbette Merino, NP [facility] None   Primary Care Physician: Rayetta Humphrey, MD Location: [facility] Outpatient Pain Management Facility Note by: Oswaldo Done, MD Date: 01/18/2019;  Time:  4:06 PM  Disclaimer:  Medicine is not an Visual merchandiser. The only guarantee in medicine is that nothing is guaranteed. It is important to note that the decision to proceed with this intervention was based on the information collected from the patient. The Data and conclusions were drawn from the patient's questionnaire, the interview, and the physical examination. Because the information was provided in large part by the patient, it cannot be guaranteed that it has not been purposely or unconsciously manipulated. Every effort has been made to obtain as much relevant data as possible for this evaluation. It is important to note that the conclusions that lead to this procedure are derived in large part from the available data. Always take into account that the treatment will also be dependent on availability of resources and existing treatment guidelines, considered by other Pain Management Practitioners as being common knowledge and practice, at the time of the intervention. For Medico-Legal purposes, it is also important to point out that variation in procedural techniques and pharmacological choices are the acceptable norm. The indications, contraindications, technique, and results of the above procedure should only be interpreted and judged by a Board-Certified Interventional Pain Specialist with extensive familiarity and expertise in the same exact procedure and technique.

## 2019-01-19 ENCOUNTER — Telehealth: Payer: Self-pay | Admitting: *Deleted

## 2019-01-19 NOTE — Telephone Encounter (Signed)
No problems post procedure. 

## 2019-01-31 ENCOUNTER — Telehealth: Payer: Self-pay | Admitting: *Deleted

## 2019-01-31 NOTE — Telephone Encounter (Signed)
Called patient and left a voice mail.  She has a standing order for GONB.  Please schedule this.  I am not sure whether or not she would like sedation, but I gave her both options and instructions on the voicemail.  It does not appear that she is on any blood thinners, but please clarify this when you schedule her appointment.  Thank you

## 2019-02-01 ENCOUNTER — Ambulatory Visit: Payer: Medicare Other | Admitting: Nurse Practitioner

## 2019-02-08 ENCOUNTER — Other Ambulatory Visit: Payer: Self-pay

## 2019-02-08 ENCOUNTER — Encounter: Payer: Self-pay | Admitting: Nurse Practitioner

## 2019-02-08 ENCOUNTER — Ambulatory Visit: Payer: Medicare Other | Attending: Nurse Practitioner | Admitting: Nurse Practitioner

## 2019-02-08 VITALS — BP 130/61 | HR 66 | Temp 97.8°F | Resp 18 | Ht 63.0 in | Wt 220.0 lb

## 2019-02-08 DIAGNOSIS — M5481 Occipital neuralgia: Secondary | ICD-10-CM | POA: Insufficient documentation

## 2019-02-08 DIAGNOSIS — M47817 Spondylosis without myelopathy or radiculopathy, lumbosacral region: Secondary | ICD-10-CM | POA: Insufficient documentation

## 2019-02-08 DIAGNOSIS — G894 Chronic pain syndrome: Secondary | ICD-10-CM | POA: Insufficient documentation

## 2019-02-08 DIAGNOSIS — M7918 Myalgia, other site: Secondary | ICD-10-CM | POA: Diagnosis present

## 2019-02-08 DIAGNOSIS — Z79891 Long term (current) use of opiate analgesic: Secondary | ICD-10-CM | POA: Insufficient documentation

## 2019-02-08 MED ORDER — OXYCODONE HCL 5 MG PO TABS: 5.0000 mg | ORAL_TABLET | Freq: Three times a day (TID) | ORAL | 0 refills | Status: DC | PRN

## 2019-02-08 MED ORDER — GABAPENTIN 400 MG PO CAPS: 400.0000 mg | ORAL_CAPSULE | Freq: Three times a day (TID) | ORAL | 2 refills | Status: DC

## 2019-02-08 MED ORDER — TIZANIDINE HCL 4 MG PO TABS: 4.0000 mg | ORAL_TABLET | Freq: Two times a day (BID) | ORAL | 2 refills | Status: DC | PRN

## 2019-02-08 NOTE — Progress Notes (Signed)
Patient's Name: Amber Acosta  MRN: [id number]  Referring Provider: Sharyne Peach, MD  DOB: 1951-09-11  PCP: Sharyne Peach, MD  DOS: 02/08/2019  Note by: Dionisio David, NP  Service setting: Ambulatory outpatient  Specialty: Interventional Pain Management  Location: [facility] (AMB) Pain Management Facility    Patient type: Established   HPI  Reason for Visit: [name] is a 68 y.o. year old, female patient, who comes today with a chief complaint of No chief complaint on file. Last Appointment: Her last appointment at our practice was on 01/31/2019. I last saw her on 11/04/2018.  Pain Assessment: Today, [name] describes the severity of the Chronic pain as a 0-No pain/10. She indicates the location/referral of the pain to be Back Lower, Upper/From lower back to upper back . Onset was: More than a month ago. The quality of pain is described as Sharp, Pressure. Temporal description, or timing of pain is: Intermittent. Possible modifying factors: Oxycodone, rest and heat . [name]'s  height is _0  (1.6 m) and weight is 220 lb (99.8 kg). Her oral temperature is 97.8 F (36.6 C). Her blood pressure is 130/61 and her pulse is 66. Her respiration is 18 and oxygen saturation is 99%.   Controlled Substance Pharmacotherapy Assessment REMS (Risk Evaluation and Mitigation Strategy)  Analgesic:Oxycodone IR 5 mg every 8 hours (15 mg/day) MME/day:22.5 mg/day Janne Napoleon, RN  02/08/2019 10:20 AM  Sign when Signing Visit Nursing Pain Medication Assessment:  Safety precautions to be maintained throughout the outpatient stay will include: orient to surroundings, keep bed in low position, maintain call bell within reach at all times, provide assistance with transfer out of bed and ambulation.  Medication Inspection Compliance: Pill count conducted under aseptic conditions, in front of the patient. Neither the pills nor the bottle was removed from the patient's sight at any time. Once count was completed  pills were immediately returned to the patient in their original bottle.  Medication: Oxycodone IR Pill/Patch Count: 73 of 90 pills remain Pill/Patch Appearance: Markings consistent with prescribed medication Bottle Appearance: Standard pharmacy container. Clearly labeled. Filled Date: 03 / 04 / 2020 Last Medication intake:  Yesterday   Pharmacokinetics: Liberation and absorption (onset of action): WNL Distribution (time to peak effect): WNL Metabolism and excretion (duration of action): WNL         Pharmacodynamics: Desired effects: Analgesia: [name] reports >50% benefit. Functional ability: Patient reports that medication allows her to accomplish basic ADLs Clinically meaningful improvement in function (CMIF): Sustained CMIF goals met Perceived effectiveness: Described as relatively effective, allowing for increase in activities of daily living (ADL) Undesirable effects: Side-effects or Adverse reactions: None reported Monitoring: [place] PMP: Online review of the past 19-monthperiod conducted. Compliant with practice rules and regulations Last UDS on record: Summary  Date Value Ref Range Status  06/17/2018 FINAL  Final    Comment:    ==================================================================== TOXASSURE SELECT 13 (MW) ==================================================================== Test                             Result       Flag       Units Drug Present and Declared for Prescription Verification   Oxycodone                      2096         EXPECTED   ng/mg creat   Noroxycodone  1362         EXPECTED   ng/mg creat    Sources of oxycodone include scheduled prescription medications.    Noroxycodone is an expected metabolite of oxycodone. ==================================================================== Test                      Result    Flag   Units      Ref Range   Creatinine              125              mg/dL       >=20 ==================================================================== Declared Medications:  The flagging and interpretation on this report are based on the  following declared medications.  Unexpected results may arise from  inaccuracies in the declared medications.  **Note: The testing scope of this panel includes these medications:  Oxycodone  **Note: The testing scope of this panel does not include following  reported medications:  Albuterol  Amlodipine Besylate  Famotidine  Fluoxetine  Fluticasone (Advair)  Furosemide  Hydralazine  Insulin (Humulin)  Losartan (Losartan Potassium)  Lovastatin  Metoprolol  Nitroglycerin  Omeprazole  Potassium  Ranitidine  Salmeterol (Advair)  Tizanidine  Trazodone ==================================================================== For clinical consultation, please call [phone]. ====================================================================    UDS interpretation: Compliant          Medication Assessment Form: Reviewed. Patient indicates being compliant with therapy Treatment compliance: Compliant Risk Assessment Profile: Aberrant behavior: See initial evaluations. None observed or detected today Comorbid factors increasing risk of overdose: See initial evaluation. No additional risks detected today Opioid risk tool (ORT):  Opioid Risk  11/04/2018  Alcohol 3  Illegal Drugs 0  Rx Drugs 0  Alcohol 0  Illegal Drugs 0  Rx Drugs 0  Age between 16-45 years  0  History of Preadolescent Sexual Abuse 0  Psychological Disease 0  ADD -  OCD -  Bipolar -  Depression 1  Opioid Risk Tool Scoring 4  Opioid Risk Interpretation Moderate Risk    ORT Scoring interpretation table:  Score <3 = Low Risk for SUD  Score between 4-7 = Moderate Risk for SUD  Score >8 = High Risk for Opioid Abuse   Risk of substance use disorder (SUD): Moderate-to-High  Risk Mitigation Strategies:  Patient Counseling:  Covered Patient-Prescriber Agreement (PPA): Present and active  Notification to other healthcare providers: Done  Pharmacologic Plan: No change in therapy, at this time.             ROS  Constitutional: Denies any fever or chills Gastrointestinal: No reported hemesis, hematochezia, vomiting, or acute GI distress Musculoskeletal: Denies any acute onset joint swelling, redness, loss of ROM, or weakness Neurological: No reported episodes of acute onset apraxia, aphasia, dysarthria, agnosia, amnesia, paralysis, loss of coordination, or loss of consciousness  Medication Review  FLUoxetine, Fluticasone-Salmeterol, Insulin Syringe-Needle U-100, albuterol, amLODipine, furosemide, gabapentin, hydrALAZINE, insulin NPH Human, losartan, lovastatin, metoprolol succinate, nitroGLYCERIN, omeprazole, oxyCODONE, potassium chloride, ranitidine, tiZANidine, and traZODone  History Review  Allergy: [name] is allergic to aspirin; ciprofloxacin; etanercept; metformin; methotrexate; mushroom extract complex; other; darvon [propoxyphene]; minocycline; and cobalamin combinations. Drug: [name]  reports no history of drug use. Alcohol:  reports no history of alcohol use. Tobacco:  reports that she has quit smoking. She has quit using smokeless tobacco. Social: [name]  reports that she has quit smoking. She has quit using smokeless tobacco. She reports that she does not drink alcohol or use drugs. Medical:  has a past medical history of Arthritis, Cervical spondylosis without myelopathy (09/21/2015), Chronic pain, Chronic pain associated with significant psychosocial dysfunction (08/28/2013), Depression, Diabetes mellitus without complication ([place]), Displacement of cervical intervertebral disc without myelopathy (09/05/2015), Fibromyalgia, GERD (gastroesophageal reflux disease), Hypertension, Kidney disease, chronic, stage III (GFR 30-59 ml/min) ([place]), Sarcoid, and Thrombopenia ([place]). Surgical: [name]  has a past  surgical history that includes neck fusion; Breast surgery; Back surgery; Knee arthroscopy; Fracture surgery; Hand surgery; Abdominal hysterectomy; and Cyst excision. Family: family history includes Alcohol abuse in her brother; Alzheimer's disease in her mother; COPD in her father; Diabetes in her father; Heart disease in her father. Problem List: [name] does not have any pertinent problems on file.  Lab Review  Kidney Function Lab Results  Component Value Date   BUN 14 03/26/2016   CREATININE 1.28 (H) 03/26/2016   GFRAA 50 (L) 03/26/2016   GFRNONAA 44 (L) 03/26/2016  Liver Function Lab Results  Component Value Date   AST 24 03/26/2016   ALT 20 03/26/2016   ALBUMIN 4.0 03/26/2016  Note: Above Lab results reviewed.  Imaging Review  DG C-Arm 1-60 Min-No Report Fluoroscopy was utilized by the requesting physician.  No radiographic  interpretation.  Note: Reviewed        Physical Exam  General appearance: Well nourished, well developed, and well hydrated. In no apparent acute distress Mental status: Alert, oriented x 3 (person, place, & time)       Respiratory: No evidence of acute respiratory distress Eyes: PERLA Vitals: BP 130/61   Pulse 66   Temp 97.8 F (36.6 C) (Oral)   Resp 18   Ht _0  (1.6 m)   Wt 220 lb (99.8 kg)   SpO2 99%   BMI 38.97 kg/m  BMI: Estimated body mass index is 38.97 kg/m as calculated from the following:   Height as of this encounter: _1  (1.6 m).   Weight as of this encounter: 220 lb (99.8 kg). Ideal: Ideal body weight: 52.4 kg (115 lb 8.3 oz) Adjusted ideal body weight: 71.4 kg (157 lb 5 oz) Cervical Spine Area Exam  Skin & Axial Inspection: No masses, redness, edema, swelling, or associated skin lesions Alignment: Symmetrical Functional ROM: Unrestricted ROM      Stability: No instability detected Muscle Tone/Strength: Functionally intact. No obvious neuro-muscular anomalies detected. Sensory (Neurological): Unimpaired Palpation: No  palpable anomalies             Lumbar Spine Area Exam  Skin & Axial Inspection: No masses, redness, or swelling Alignment: Symmetrical Functional ROM: Unrestricted ROM       Stability: No instability detected Muscle Tone/Strength: Functionally intact. No obvious neuro-muscular anomalies detected. Sensory (Neurological): Unimpaired Palpation: No palpable anomalies       Provocative Tests: Hyperextension/rotation test: deferred today       Lumbar quadrant test (Kemp's test): deferred today       Lateral bending test: deferred today       Patrick's Maneuver: deferred today                    Gait & Posture Assessment  Ambulation: Unassisted Gait: Relatively normal for age and body habitus Posture: WNL   Assessment   Status Diagnosis  Persistent Controlled Controlled 1. Chronic Occipital neuralgia (Left)   2. Myofascial pain   3. Spondylosis without myelopathy or radiculopathy, lumbosacral region   4. Chronic pain syndrome   5. Long term prescription opiate use      Updated  Problems: No problems updated.  Plan of Care  Pharmacotherapy (Medications Ordered): Meds ordered this encounter  Medications  . oxyCODONE (OXY IR/ROXICODONE) 5 MG immediate release tablet    Sig: Take 1 tablet (5 mg total) by mouth every 8 (eight) hours as needed for up to 30 days for moderate pain or severe pain.    Dispense:  90 tablet    Refill:  0    Do not place this medication, or any other prescription from our practice, on "Automatic Refill". Patient may have prescription filled one day early if pharmacy is closed on scheduled refill date.    Order Specific Question:   Supervising Provider    Answer:   Milinda Pointer [phone]  . oxyCODONE (OXY IR/ROXICODONE) 5 MG immediate release tablet    Sig: Take 1 tablet (5 mg total) by mouth every 8 (eight) hours as needed for up to 30 days for moderate pain or severe pain.    Dispense:  90 tablet    Refill:  0    Do not place this medication, or  any other prescription from our practice, on "Automatic Refill". Patient may have prescription filled one day early if pharmacy is closed on scheduled refill date.    Order Specific Question:   Supervising Provider    Answer:   Milinda Pointer [phone]  . oxyCODONE (OXY IR/ROXICODONE) 5 MG immediate release tablet    Sig: Take 1 tablet (5 mg total) by mouth every 8 (eight) hours as needed for up to 30 days for moderate pain or severe pain.    Dispense:  90 tablet    Refill:  0    Do not place this medication, or any other prescription from our practice, on "Automatic Refill". Patient may have prescription filled one day early if pharmacy is closed on scheduled refill date.    Order Specific Question:   Supervising Provider    Answer:   Milinda Pointer [phone]  . gabapentin (NEURONTIN) 400 MG capsule    Sig: Take 1 capsule (400 mg total) by mouth 3 (three) times daily.    Dispense:  90 capsule    Refill:  2    Order Specific Question:   Supervising Provider    Answer:   Milinda Pointer [phone]  . tiZANidine (ZANAFLEX) 4 MG tablet    Sig: Take 1 tablet (4 mg total) by mouth 2 (two) times daily as needed for muscle spasms.    Dispense:  60 tablet    Refill:  2    Do not place this medication, or any other prescription from our practice, on "Automatic Refill". Patient may have prescription filled one day early if pharmacy is closed on scheduled refill date.    Order Specific Question:   Supervising Provider    Answer:   Milinda Pointer [phone]   Administered today: Idell Pickles had no medications administered during this visit.  Orders:  Orders Placed This Encounter  Procedures  . ToxASSURE Select 13 (MW), Urine    Volume: 30 ml(s). Minimum 3 ml of urine is needed. Document temperature of fresh sample. Indications: Long term (current) use of opiate analgesic (K27.062)   Follow-up plan:   Return in about 3 months (around 05/11/2019) for MedMgmt. Palliative left-sided  greater occipital nerve block(NORFA) (for left-sided headaches)   Interventional options:  Planned, scheduled, and/or pending: NOTE:NO RFA(Poor candidate due to delayed sensory perceptionand intra-procedural non-compliance.) No additional procedures planned at this time.  Medication management by Dionisio David, NP, under my supervision.  We will continue to encourage the patient to lower her BMI below 30.   Considering: No additional procedures planned at this time.   Palliative PRN treatment(s): Palliative bilateral lumbar facet block(NO RFA)(for low back pain) Palliativeleft cervical facet block(NO RFA)(for left-sided neck pain)  Palliative left-sided greater occipital nerve block(NORFA) (for left-sided headaches)    Note by: Dionisio David, NP Date: 02/08/2019; Time: 1:21 PM

## 2019-02-08 NOTE — Progress Notes (Signed)
Nursing Pain Medication Assessment:  Safety precautions to be maintained throughout the outpatient stay will include: orient to surroundings, keep bed in low position, maintain call bell within reach at all times, provide assistance with transfer out of bed and ambulation.  Medication Inspection Compliance: Pill count conducted under aseptic conditions, in front of the patient. Neither the pills nor the bottle was removed from the patient's sight at any time. Once count was completed pills were immediately returned to the patient in their original bottle.  Medication: Oxycodone IR Pill/Patch Count: 73 of 90 pills remain Pill/Patch Appearance: Markings consistent with prescribed medication Bottle Appearance: Standard pharmacy container. Clearly labeled. Filled Date: 03 / 04 / 2020 Last Medication intake:  Yesterday

## 2019-02-08 NOTE — Patient Instructions (Signed)
____________________________________________________________________________________________  Medication Rules  Purpose: To inform patients, and their family members, of our rules and regulations.  Applies to: All patients receiving prescriptions (written or electronic).  Pharmacy of record: Pharmacy where electronic prescriptions will be sent. If written prescriptions are taken to a different pharmacy, please inform the nursing staff. The pharmacy listed in the electronic medical record should be the one where you would like electronic prescriptions to be sent.  Electronic prescriptions: In compliance with the [place] Strengthen Opioid Misuse Prevention (STOP) Act of 2017 (Session Law 2017-74/H243), effective December 01, 2018, all controlled substances must be electronically prescribed. Calling prescriptions to the pharmacy will cease to exist.  Prescription refills: Only during scheduled appointments. Applies to all prescriptions.  NOTE: The following applies primarily to controlled substances (Opioid* Pain Medications).   Patient's responsibilities: 1. Pain Pills: Bring all pain pills to every appointment (except for procedure appointments). 2. Pill Bottles: Bring pills in original pharmacy bottle. Always bring the newest bottle. Bring bottle, even if empty. 3. Medication refills: You are responsible for knowing and keeping track of what medications you take and those you need refilled. The day before your appointment: write a list of all prescriptions that need to be refilled. The day of the appointment: give the list to the admitting nurse. Prescriptions will be written only during appointments. No prescriptions will be written on procedure days. If you forget a medication: it will not be "Called in", "Faxed", or "electronically sent". You will need to get another appointment to get these prescribed. No early refills. Do not call asking to have your prescription filled  early. 4. Prescription Accuracy: You are responsible for carefully inspecting your prescriptions before leaving our office. Have the discharge nurse carefully go over each prescription with you, before taking them home. Make sure that your name is accurately spelled, that your address is correct. Check the name and dose of your medication to make sure it is accurate. Check the number of pills, and the written instructions to make sure they are clear and accurate. Make sure that you are given enough medication to last until your next medication refill appointment. 5. Taking Medication: Take medication as prescribed. When it comes to controlled substances, taking less pills or less frequently than prescribed is permitted and encouraged. Never take more pills than instructed. Never take medication more frequently than prescribed.  6. Inform other Doctors: Always inform, all of your healthcare providers, of all the medications you take. 7. Pain Medication from other Providers: You are not allowed to accept any additional pain medication from any other Doctor or Healthcare provider. There are two exceptions to this rule. (see below) In the event that you require additional pain medication, you are responsible for notifying us, as stated below. 8. Medication Agreement: You are responsible for carefully reading and following our Medication Agreement. This must be signed before receiving any prescriptions from our practice. Safely store a copy of your signed Agreement. Violations to the Agreement will result in no further prescriptions. (Additional copies of our Medication Agreement are available upon request.) 9. Laws, Rules, & Regulations: All patients are expected to follow all Federal and State Laws, Statutes, Rules, & Regulations. Ignorance of the Laws does not constitute a valid excuse. The use of any illegal substances is prohibited. 10. Adopted CDC guidelines & recommendations: Target dosing levels will be  at or below 60 MME/day. Use of benzodiazepines** is not recommended.  Exceptions: There are only two exceptions to the rule of not   receiving pain medications from other Healthcare Providers. 1. Exception #1 (Emergencies): In the event of an emergency (i.e.: accident requiring emergency care), you are allowed to receive additional pain medication. However, you are responsible for: As soon as you are able, call our office [phone], at any time of the day or night, and leave a message stating your name, the date and nature of the emergency, and the name and dose of the medication prescribed. In the event that your call is answered by a member of our staff, make sure to document and save the date, time, and the name of the person that took your information.  2. Exception #2 (Planned Surgery): In the event that you are scheduled by another doctor or dentist to have any type of surgery or procedure, you are allowed (for a period no longer than 30 days), to receive additional pain medication, for the acute post-op pain. However, in this case, you are responsible for picking up a copy of our "Post-op Pain Management for Surgeons" handout, and giving it to your surgeon or dentist. This document is available at our office, and does not require an appointment to obtain it. Simply go to our office during business hours (Monday-Thursday from 8:00 AM to 4:00 PM) (Friday 8:00 AM to 12:00 Noon) or if you have a scheduled appointment with us, prior to your surgery, and ask for it by name. In addition, you will need to provide us with your name, name of your surgeon, type of surgery, and date of procedure or surgery.  *Opioid medications include: morphine, codeine, oxycodone, oxymorphone, hydrocodone, hydromorphone, meperidine, tramadol, tapentadol, buprenorphine, fentanyl, methadone. **Benzodiazepine medications include: diazepam (Valium), alprazolam (Xanax), clonazepam (Klonopine), lorazepam (Ativan), clorazepate  (Tranxene), chlordiazepoxide (Librium), estazolam (Prosom), oxazepam (Serax), temazepam (Restoril), triazolam (Halcion) (Last updated: 01/28/2018) ____________________________________________________________________________________________    

## 2019-02-10 ENCOUNTER — Ambulatory Visit: Payer: Medicare Other | Admitting: Pain Medicine

## 2019-02-10 NOTE — Progress Notes (Deleted)
Appointment rescheduled due to Epic Downtime. 

## 2019-02-14 LAB — TOXASSURE SELECT 13 (MW), URINE

## 2019-02-15 ENCOUNTER — Other Ambulatory Visit: Payer: Self-pay

## 2019-02-15 ENCOUNTER — Ambulatory Visit (HOSPITAL_BASED_OUTPATIENT_CLINIC_OR_DEPARTMENT_OTHER): Payer: Medicare Other | Admitting: Pain Medicine

## 2019-02-15 ENCOUNTER — Encounter: Payer: Self-pay | Admitting: Pain Medicine

## 2019-02-15 ENCOUNTER — Ambulatory Visit
Admission: RE | Admit: 2019-02-15 | Discharge: 2019-02-15 | Disposition: A | Discharge status: Home / Self Care | Payer: Medicare Other | Source: Ambulatory Visit | Attending: Pain Medicine | Admitting: Pain Medicine

## 2019-02-15 VITALS — BP 109/61 | HR 67 | Temp 97.3°F | Resp 19 | Ht 63.0 in | Wt 220.0 lb

## 2019-02-15 DIAGNOSIS — M542 Cervicalgia: Secondary | ICD-10-CM

## 2019-02-15 DIAGNOSIS — M5481 Occipital neuralgia: Secondary | ICD-10-CM

## 2019-02-15 DIAGNOSIS — R51 Headache: Secondary | ICD-10-CM | POA: Insufficient documentation

## 2019-02-15 DIAGNOSIS — M5030 Other cervical disc degeneration, unspecified cervical region: Secondary | ICD-10-CM

## 2019-02-15 DIAGNOSIS — G4486 Cervicogenic headache: Secondary | ICD-10-CM

## 2019-02-15 MED ORDER — DEXAMETHASONE SODIUM PHOSPHATE 10 MG/ML IJ SOLN
10.0000 mg | Freq: Once | INTRAMUSCULAR | Status: AC
  Administered 2019-02-15: 10 mg
  Filled 2019-02-15: qty 1

## 2019-02-15 MED ORDER — FENTANYL CITRATE (PF) 100 MCG/2ML IJ SOLN
25.0000 ug | INTRAMUSCULAR | Status: DC | PRN
  Administered 2019-02-15: 50 ug via INTRAVENOUS
  Filled 2019-02-15: qty 2

## 2019-02-15 MED ORDER — ROPIVACAINE HCL 2 MG/ML IJ SOLN
4.0000 mL | Freq: Once | INTRAMUSCULAR | Status: AC
  Administered 2019-02-15: 4 mL
  Filled 2019-02-15: qty 10

## 2019-02-15 MED ORDER — LACTATED RINGERS IV SOLN
1000.0000 mL | Freq: Once | INTRAVENOUS | Status: AC
  Administered 2019-02-15: 1000 mL via INTRAVENOUS

## 2019-02-15 MED ORDER — MIDAZOLAM HCL 5 MG/5ML IJ SOLN
1.0000 mg | INTRAMUSCULAR | Status: DC | PRN
  Administered 2019-02-15: 3 mg via INTRAVENOUS
  Filled 2019-02-15: qty 5

## 2019-02-15 MED ORDER — LIDOCAINE HCL 2 % IJ SOLN
20.0000 mL | Freq: Once | INTRAMUSCULAR | Status: AC
  Administered 2019-02-15: 400 mg
  Filled 2019-02-15: qty 40

## 2019-02-15 NOTE — Progress Notes (Signed)
Patient's Name: Amber Acosta  MRN: [id number]  Referring Provider: Rayetta Humphrey, MD  DOB: 1951/01/06  PCP: Rayetta Humphrey, MD  DOS: 02/15/2019  Note by: Oswaldo Done, MD  Service setting: Ambulatory outpatient  Specialty: Interventional Pain Management  Patient type: Established  Location: [facility] (AMB) Pain Management Facility  Visit type: Interventional Procedure   Primary Reason for Visit: Interventional Pain Management Treatment. CC: Neck Pain (left) and Headache (left)  Procedure:          Anesthesia, Analgesia, Anxiolysis:  Type: Diagnostic, Greater, Occipital Nerve Block  #3  Region: Posterolateral Cervical Level: Occipital Ridge   Laterality: Left-Sided  Type: Moderate (Conscious) Sedation combined with Local Anesthesia Indication(s): Analgesia and Anxiety Route: Intravenous (IV) IV Access: Secured Sedation: Meaningful verbal contact was maintained at all times during the procedure  Local Anesthetic: Lidocaine 1-2%  Position: Prone   Indications: 1. Chronic Occipital neuralgia (Left)   2. Cervicogenic headache (Left)   3. Cervicalgia   4. DDD (degenerative disc disease), cervical    Pain Score: Pre-procedure: 5 /10 Post-procedure: 0-No pain/10  Pre-op Assessment:  Amber Acosta is a 68 y.o. (year old), female patient, seen today for interventional treatment. She  has a past surgical history that includes neck fusion; Breast surgery; Back surgery; Knee arthroscopy; Fracture surgery; Hand surgery; Abdominal hysterectomy; and Cyst excision. Amber Acosta has a current medication list which includes the following prescription(s): advair diskus, albuterol, amlodipine, b-d ins syr ultrafine 1cc/30g, b-d ins syr ultrafine 1cc/31g, fluoxetine, furosemide, gabapentin, klor-con 10, losartan, lovastatin, metoprolol succinate, nitroglycerin, omeprazole, oxycodone, oxycodone, oxycodone, ranitidine, tizanidine, trazodone, hydralazine, and insulin nph human, and the following  Facility-Administered Medications: fentanyl and midazolam. Her primarily concern today is the Neck Pain (left) and Headache (left)  Initial Vital Signs:  Pulse/HCG Rate: 67ECG Heart Rate: 63 Temp: 97.8 F (36.6 C) Resp: 18 BP: 128/66 SpO2: 99 %  BMI: Estimated body mass index is 38.97 kg/m as calculated from the following:   Height as of this encounter:  (1.6 m).   Weight as of this encounter: 220 lb (99.8 kg).  Risk Assessment: Allergies: Reviewed. She is allergic to aspirin; ciprofloxacin; etanercept; metformin; methotrexate; mushroom extract complex; other; darvon [propoxyphene]; minocycline; and cobalamin combinations.  Allergy Precautions: None required Coagulopathies: Reviewed. None identified.  Blood-thinner therapy: None at this time Active Infection(s): Reviewed. None identified. Amber Acosta is afebrile  Site Confirmation: Amber Acosta was asked to confirm the procedure and laterality before marking the site Procedure checklist: Completed Consent: Before the procedure and under the influence of no sedative(s), amnesic(s), or anxiolytics, the patient was informed of the treatment options, risks and possible complications. To fulfill our ethical and legal obligations, as recommended by the American Medical Association's Code of Ethics, I have informed the patient of my clinical impression; the nature and purpose of the treatment or procedure; the risks, benefits, and possible complications of the intervention; the alternatives, including doing nothing; the risk(s) and benefit(s) of the alternative treatment(s) or procedure(s); and the risk(s) and benefit(s) of doing nothing. The patient was provided information about the general risks and possible complications associated with the procedure. These may include, but are not limited to: failure to achieve desired goals, infection, bleeding, organ or nerve damage, allergic reactions, paralysis, and death. In addition, the patient was  informed of those risks and complications associated to the procedure, such as failure to decrease pain; infection; bleeding; organ or nerve damage with subsequent damage to sensory, motor, and/or autonomic systems, resulting in  permanent pain, numbness, and/or weakness of one or several areas of the body; allergic reactions; (i.e.: anaphylactic reaction); and/or death. Furthermore, the patient was informed of those risks and complications associated with the medications. These include, but are not limited to: allergic reactions (i.e.: anaphylactic or anaphylactoid reaction(s)); adrenal axis suppression; blood sugar elevation that in diabetics may result in ketoacidosis or comma; water retention that in patients with history of congestive heart failure may result in shortness of breath, pulmonary edema, and decompensation with resultant heart failure; weight gain; swelling or edema; medication-induced neural toxicity; particulate matter embolism and blood vessel occlusion with resultant organ, and/or nervous system infarction; and/or aseptic necrosis of one or more joints. Finally, the patient was informed that Medicine is not an exact science; therefore, there is also the possibility of unforeseen or unpredictable risks and/or possible complications that may result in a catastrophic outcome. The patient indicated having understood very clearly. We have given the patient no guarantees and we have made no promises. Enough time was given to the patient to ask questions, all of which were answered to the patient's satisfaction. Amber Acosta has indicated that she wanted to continue with the procedure. Attestation: I, the ordering provider, attest that I have discussed with the patient the benefits, risks, side-effects, alternatives, likelihood of achieving goals, and potential problems during recovery for the procedure that I have provided informed consent. Date   Time: 02/15/2019  8:08 AM  Pre-Procedure Preparation:   Monitoring: As per clinic protocol. Respiration, ETCO2, SpO2, BP, heart rate and rhythm monitor placed and checked for adequate function Safety Precautions: Patient was assessed for positional comfort and pressure points before starting the procedure. Time-out: I initiated and conducted the "Time-out" before starting the procedure, as per protocol. The patient was asked to participate by confirming the accuracy of the "Time Out" information. Verification of the correct person, site, and procedure were performed and confirmed by me, the nursing staff, and the patient. "Time-out" conducted as per Joint Commission's Universal Protocol (UP.01.01.01). Time: 0837  Description of Procedure:          Target Area: Area medial to the occipital artery at the level of the superior nuchal ridge Approach: Posterior approach Area Prepped: Entire Posterior Occipital Region Prepping solution: ChloraPrep (2% chlorhexidine gluconate and 70% isopropyl alcohol) Safety Precautions: Aspiration looking for blood return was conducted prior to all injections. At no point did we inject any substances, as a needle was being advanced. No attempts were made at seeking any paresthesias. Safe injection practices and needle disposal techniques used. Medications properly checked for expiration dates. SDV (single dose vial) medications used. Description of the Procedure: Protocol guidelines were followed. The target area was identified and the area prepped in the usual manner. Skin & deeper tissues infiltrated with local anesthetic. Appropriate amount of time allowed to pass for local anesthetics to take effect. The procedure needles were then advanced to the target area. Proper needle placement secured. Negative aspiration confirmed. Solution injected in intermittent fashion, asking for systemic symptoms every 0.5cc of injectate. The needles were then removed and the area cleansed, making sure to leave some of the prepping solution back  to take advantage of its long term bactericidal properties.  Vitals:   02/15/19 0849 02/15/19 0851 02/15/19 0900 02/15/19 0911  BP: (!) 94/49 95/75 110/69 109/61  Pulse:      Resp: Temp:  97.6 F (36.4 C)  (!) 97.3 F (36.3 C)  TempSrc:  SpO2: 100% 99% 99% 99%  Weight:      Height:        Start Time: 0837 hrs. End Time: 0846 hrs. Materials:  Needle(s) Type: Spinal Needle Gauge: 22G Length: 3.5-in Medication(s): Please see orders for medications and dosing details.  Imaging Guidance (Non-Spinal):          Type of Imaging Technique: Fluoroscopy Guidance (Non-Spinal) Indication(s): Assistance in needle guidance and placement for procedures requiring needle placement in or near specific anatomical locations not easily accessible without such assistance. Exposure Time: Please see nurses notes. Contrast: None used. Fluoroscopic Guidance: I was personally present during the use of fluoroscopy. "Tunnel Vision Technique" used to obtain the best possible view of the target area. Parallax error corrected before commencing the procedure. "Direction-depth-direction" technique used to introduce the needle under continuous pulsed fluoroscopy. Once target was reached, antero-posterior, oblique, and lateral fluoroscopic projection used confirm needle placement in all planes. Images permanently stored in EMR. Interpretation: No contrast injected. I personally interpreted the imaging intraoperatively. Adequate needle placement confirmed in multiple planes. Permanent images saved into the patient's record.  Antibiotic Prophylaxis:   Anti-infectives (From admission, onward)   None     Indication(s): None identified  Post-operative Assessment:  Post-procedure Vital Signs:  Pulse/HCG Rate: 6768 Temp: (!) 97.3 F (36.3 C) Resp: 19 BP: 109/61 SpO2: 99 %  EBL: None  Complications: No immediate post-treatment complications observed by team, or reported by patient.  Note: The  patient tolerated the entire procedure well. A repeat set of vitals were taken after the procedure and the patient was kept under observation following institutional policy, for this type of procedure. Post-procedural neurological assessment was performed, showing return to baseline, prior to discharge. The patient was provided with post-procedure discharge instructions, including a section on how to identify potential problems. Should any problems arise concerning this procedure, the patient was given instructions to immediately contact us, at any time, without hesitation. In any case, we plan to contact the patient by telephone for a follow-up status report regarding this interventional procedure.  Comments:  No additional relevant information.  Plan of Care  Orders:  Orders Placed This Encounter  Procedures   GREATER OCCIPITAL NERVE BLOCK    Scheduling Instructions:     Side: Left-sided     Sedation: With Sedation.     Timeframe: Today    Order Specific Question:   Where will this procedure be performed?    Answer:   [facility]   DG C-Arm 1-60 Min-No Report    Intraoperative interpretation by procedural physician at [facility].    Standing Status:   Standing    Number of Occurrences:   1    Order Specific Question:   Reason for exam:    Answer:   Assistance in needle guidance and placement for procedures requiring needle placement in or near specific anatomical locations not easily accessible without such assistance.   Provider attestation of informed consent for procedure/surgical case    I, the ordering provider, attest that I have discussed with the patient the benefits, risks, side effects, alternatives, likelihood of achieving goals and potential problems during recovery for the procedure that I have provided informed consent.   Informed Consent Details: Transcribe to consent form and obtain patient signature    Consent Attestation: I, the ordering provider,  attest that I have discussed with the patient the benefits, risks, side-effects, alternatives, likelihood of achieving goals, and potential problems during recovery for the procedure that  I have provided informed consent.    Standing Status:   Standing    Number of Occurrences:   1    Order Specific Question:   Procedure    Answer:   Left-sided occipital nerve block under fluoroscopic guidance    Order Specific Question:   Surgeon    Answer:   Rashae Rother A. Laban Emperor, MD    Order Specific Question:   Indication/Reason    Answer:   Left-sided occipital headaches secondary to left occipital neuralgia   Medications ordered for procedure: Meds ordered this encounter  Medications   lidocaine (XYLOCAINE) 2 % (with pres) injection 400 mg   midazolam (VERSED) 5 MG/5ML injection 1-2 mg    Make sure Flumazenil is available in the pyxis when using this medication. If oversedation occurs, administer 0.2 mg IV over 15 sec. If after 45 sec no response, administer 0.2 mg again over 1 min; may repeat at 1 min intervals; not to exceed 4 doses (1 mg)   fentaNYL (SUBLIMAZE) injection 25-50 mcg    Make sure Narcan is available in the pyxis when using this medication. In the event of respiratory depression (RR< 8/min): Titrate NARCAN (naloxone) in increments of 0.1 to 0.2 mg IV at 2-3 minute intervals, until desired degree of reversal.   lactated ringers infusion 1,000 mL   dexamethasone (DECADRON) injection 10 mg   ropivacaine (PF) 2 mg/mL (0.2%) (NAROPIN) injection 4 mL   Medications administered: We administered lidocaine, midazolam, fentaNYL, lactated ringers, dexamethasone, and ropivacaine (PF) 2 mg/mL (0.2%).  See the medical record for exact dosing, route, and time of administration.  Disposition: Discharge home  Discharge Date & Time: 02/15/2019; 0912 hrs.   Follow-up plan:   Return for PPE (2 wks) w/ NP.     Interventional management options: Planned, scheduled, and/or pending: NOTE:NO  RFA(Poor candidate due to delayed sensory perceptionand intra-procedural non-compliance.) No additional procedures planned at this time.  Medication management by Thad Ranger, NP, under my supervision.  We will continue to encourage the patient to lower her BMI below 30.   Considering: No additional procedures planned at this time.   Palliative PRN treatment(s): Palliative bilateral lumbar facet block(NO RFA)(for low back pain) Palliativeleft cervical facet block(NO RFA)(for left-sided neck pain)  Palliative left-sided greater occipital nerve block(NORFA) (for left-sided headaches)   Future Appointments  Date Time Provider Department Center  03/01/2019  8:45 AM Barbette Merino, NP [facility] None  05/09/2019  9:30 AM Barbette Merino, NP [facility] None   Primary Care Physician: Rayetta Humphrey, MD Location: [facility] Note by: Oswaldo Done, MD Date: 02/15/2019; Time: 11:03 AM  Disclaimer:  Medicine is not an exact science. The only guarantee in medicine is that nothing is guaranteed. It is important to note that the decision to proceed with this intervention was based on the information collected from the patient. The Data and conclusions were drawn from the patient's questionnaire, the interview, and the physical examination. Because the information was provided in large part by the patient, it cannot be guaranteed that it has not been purposely or unconsciously manipulated. Every effort has been made to obtain as much relevant data as possible for this evaluation. It is important to note that the conclusions that lead to this procedure are derived in large part from the available data. Always take into account that the treatment will also be dependent on availability of resources and existing treatment guidelines, considered by other Pain Management Practitioners as being common  knowledge and practice, at the time of the intervention. For  Medico-Legal purposes, it is also important to point out that variation in procedural techniques and pharmacological choices are the acceptable norm. The indications, contraindications, technique, and results of the above procedure should only be interpreted and judged by a Board-Certified Interventional Pain Specialist with extensive familiarity and expertise in the same exact procedure and technique.

## 2019-02-15 NOTE — Patient Instructions (Signed)

## 2019-02-16 ENCOUNTER — Telehealth: Payer: Self-pay

## 2019-02-16 NOTE — Telephone Encounter (Signed)
Post procedure phone call. Patient states she is doing good.  

## 2019-03-01 ENCOUNTER — Other Ambulatory Visit: Payer: Self-pay

## 2019-03-01 ENCOUNTER — Ambulatory Visit: Payer: Medicare Other | Attending: Nurse Practitioner | Admitting: Nurse Practitioner

## 2019-03-01 DIAGNOSIS — R51 Headache: Secondary | ICD-10-CM | POA: Diagnosis not present

## 2019-03-01 DIAGNOSIS — G894 Chronic pain syndrome: Secondary | ICD-10-CM

## 2019-03-01 DIAGNOSIS — M5481 Occipital neuralgia: Secondary | ICD-10-CM

## 2019-03-01 DIAGNOSIS — M542 Cervicalgia: Secondary | ICD-10-CM

## 2019-03-01 DIAGNOSIS — G4486 Cervicogenic headache: Secondary | ICD-10-CM

## 2019-03-01 NOTE — Progress Notes (Addendum)
Virtual Visit via Telephone Note  I connected with Amber Acosta on 03/01/19 at  8:45 AM EDT by telephone and verified that I am speaking with the correct person using two identifiers.   I discussed the limitations, risks, security and privacy concerns of performing an evaluation and management service by telephone and the availability of in person appointments. I also discussed with the patient that there may be a patient responsible charge related to this service. The patient expressed understanding and agreed to proceed.   History of Present Illness: Reason for Visit: Amber Acosta is a 68 y.o. year old, female patient, whose has a  chief complaint ofleft sided heaadache  Last Appointment: Her last appointment at our practice was on 02/16/2019. I last saw her on 02/08/2019.  Pain Assessment: Today, Amber Acosta describes the severity of the   as a  10/10. She indicates the location/referral of the pain to be left side     / . Onset UTM:LYYTKPT than one month  . The quality of pain is described as throbbing  . Temporal description, or timing of pain WS:[id number]  . Possible modifying factors: rest and medication  . Amber Acosta's  vitals were not taken for this visit.   Evaluation of last interventional procedure  02/15/2019 Procedure: Left -sided Greater Occipital Nerve Block  Pre-procedure pain score:  5/10 Post-procedure pain score: 0/10         Influential Factors: Intra-procedural challenges: None observed.         Reported side-effects: None.        Post-procedural adverse reactions or complications: None reported         Sedation: Please see nurses note for DOS. When no sedatives are used, the analgesic levels obtained are directly associated to the effectiveness of the local anesthetics. However, when sedation is provided, the level of analgesia obtained during the initial 1 hour following the intervention, is believed to be the result of a combination of factors. These factors may include, but  are not limited to: 1. The effectiveness of the local anesthetics used. 2. The effects of the analgesic(s) and/or anxiolytic(s) used. 3. The degree of discomfort experienced by the patient at the time of the procedure. 4. The patients ability and reliability in recalling and recording the events. 5. The presence and influence of possible secondary gains and/or psychosocial factors. Reported result: Relief experienced during the 1st hour after the procedure:  100% (Ultra-Short Term Relief) Amber Acosta has indicated area to have been numb during this time. Interpretative annotation: Clinically appropriate result. Analgesia during this period is likely to be Local Anesthetic and/or IV Sedative (Analgesic/Anxiolytic) related.          Effects of local anesthetic: The analgesic effects attained during this period are directly associated to the localized infiltration of local anesthetics and therefore cary significant diagnostic value as to the etiological location, or anatomical origin, of the pain. Expected duration of relief is directly dependent on the pharmacodynamics of the local anesthetic used. Long-acting (4-6 hours) anesthetics used.  Reported result: Relief during the next 4 to 6 hour after the procedure:  100% (Short-Term Relief) Amber Acosta has indicated area to have been numb during this time. Interpretative annotation: Clinically appropriate result. Analgesia during this period is likely to be Local Anesthetic-related.          Long-term benefit: Defined as the period of time past the expected duration of local anesthetics (1 hour for short-acting and 4-6 hours for long-acting). With the  possible exception of prolonged sympathetic blockade from the local anesthetics, benefits during this period are typically attributed to, or associated with, other factors such as analgesic sensory neuropraxia, antiinflammatory effects, or beneficial biochemical changes provided by agents other than the local  anesthetics.  Reported result: Extended relief following procedure:   (Long-Term Relief)           100% This lasted for about 48 hours only  Interpretative annotation: Clinically appropriate result. No benefit. No permanent benefit expected. Pain appears to be refractory to this treatment modality.        She continues to have a significant amount of pain and would like to have her procedure repeated as soon as possible.     Observations/Objective: Physical Exam  Mental status: Alert, oriented x 3 (person, place, & time)       Respiratory: No evidence of acute respiratory distress Vitals: There were no vitals taken for this visit. BMI: Estimated body mass index is 38.97 kg/m as calculated from the following:   Height as of 02/15/19: 5\' 3"  (1.6 m).   Weight as of 02/15/19: 220 lb (99.8 kg). Ideal: Patient weight not recorded    Assessment and Plan: Assessment   Status Diagnosis  Not responding Persistent Persistent 1. Chronic Occipital neuralgia (Left)   2. Cervicogenic headache (Left)   3. Cervicalgia   4. Chronic pain syndrome      Updated Problems: No problems updated.    Follow Up Instructions: Plan of Care  Pharmacotherapy (Medications Ordered): No orders of the defined types were placed in this encounter.  New Prescriptions   No medications on file   Medications administered today: Annella Liaw had no medications administered during this visit. Lab-work, procedure(s), and/or referral(s): No orders of the defined types were placed in this encounter.  Imaging and/or referral(s): None  Interventional therapies: Planned, scheduled, and/or pending:  Palliative left-sided greater occipital nerve block(NORFA) (for left-sided headaches) Procedure(w/Sedation), w/ Dr. Laban Emperor, (L) GONB , #4   Palliative PRN treatment(s): Palliative bilateral lumbar facet block(NO RFA)(for low back pain) Palliativeleft cervical facet block(NO RFA)(for left-sided neck pain)   Palliative left-sided greater occipital nerve block(NORFA) (for left-sided headaches)    Provider-requested follow-up: Return for Procedure(w/Sedation), w/ Dr. Laban Emperor, (L) GONB , #.  Future Appointments  Date Time Provider Department Center  05/09/2019  9:30 AM Barbette Merino, NP [facility] None   Primary Care Physician: Rayetta Humphrey, MD Location: [facility] Note by: Barbette Merino NP   I discussed the assessment and treatment plan with the patient. The patient was provided an opportunity to ask questions and all were answered. The patient agreed with the plan and demonstrated an understanding of the instructions.   The patient was advised to call back or seek an in-person evaluation if the symptoms worsen or if the condition fails to improve as anticipated.  I provided 10 minutes of non-face-to-face time during this encounter.   Thad Ranger, NP

## 2019-03-08 ENCOUNTER — Ambulatory Visit (HOSPITAL_BASED_OUTPATIENT_CLINIC_OR_DEPARTMENT_OTHER): Payer: Medicare Other | Admitting: Pain Medicine

## 2019-03-08 ENCOUNTER — Encounter: Payer: Self-pay | Admitting: Pain Medicine

## 2019-03-08 ENCOUNTER — Ambulatory Visit
Admission: RE | Admit: 2019-03-08 | Discharge: 2019-03-08 | Disposition: A | Discharge status: Home / Self Care | Payer: Medicare Other | Source: Ambulatory Visit | Attending: Pain Medicine | Admitting: Pain Medicine

## 2019-03-08 ENCOUNTER — Other Ambulatory Visit: Payer: Self-pay

## 2019-03-08 VITALS — BP 121/55 | HR 100 | Temp 98.0°F | Resp 18 | Ht 63.0 in | Wt 220.0 lb

## 2019-03-08 DIAGNOSIS — M5481 Occipital neuralgia: Secondary | ICD-10-CM

## 2019-03-08 DIAGNOSIS — R51 Headache: Secondary | ICD-10-CM

## 2019-03-08 DIAGNOSIS — M47812 Spondylosis without myelopathy or radiculopathy, cervical region: Secondary | ICD-10-CM

## 2019-03-08 DIAGNOSIS — G4486 Cervicogenic headache: Secondary | ICD-10-CM

## 2019-03-08 MED ORDER — GLYCOPYRROLATE 0.2 MG/ML IJ SOLN
INTRAMUSCULAR | Status: AC
  Filled 2019-03-08: qty 1

## 2019-03-08 MED ORDER — FENTANYL CITRATE (PF) 100 MCG/2ML IJ SOLN
25.0000 ug | INTRAMUSCULAR | Status: DC | PRN
  Administered 2019-03-08: 50 ug via INTRAVENOUS

## 2019-03-08 MED ORDER — DEXAMETHASONE SODIUM PHOSPHATE 10 MG/ML IJ SOLN
INTRAMUSCULAR | Status: AC
  Filled 2019-03-08: qty 1

## 2019-03-08 MED ORDER — GLYCOPYRROLATE 0.2 MG/ML IJ SOLN
0.2000 mg | Freq: Once | INTRAMUSCULAR | Status: AC
  Administered 2019-03-08: 0.2 mg via INTRAVENOUS

## 2019-03-08 MED ORDER — FENTANYL CITRATE (PF) 100 MCG/2ML IJ SOLN
INTRAMUSCULAR | Status: AC
  Filled 2019-03-08: qty 2

## 2019-03-08 MED ORDER — DEXAMETHASONE SODIUM PHOSPHATE 10 MG/ML IJ SOLN
10.0000 mg | Freq: Once | INTRAMUSCULAR | Status: AC
  Administered 2019-03-08: 10 mg
  Filled 2019-03-08: qty 1

## 2019-03-08 MED ORDER — MIDAZOLAM HCL 5 MG/5ML IJ SOLN
INTRAMUSCULAR | Status: AC
  Filled 2019-03-08: qty 5

## 2019-03-08 MED ORDER — LIDOCAINE HCL (PF) 1 % IJ SOLN
INTRAMUSCULAR | Status: AC
  Filled 2019-03-08: qty 5

## 2019-03-08 MED ORDER — LIDOCAINE HCL 2 % IJ SOLN
INTRAMUSCULAR | Status: AC
  Filled 2019-03-08: qty 20

## 2019-03-08 MED ORDER — LACTATED RINGERS IV SOLN
1000.0000 mL | Freq: Once | INTRAVENOUS | Status: AC
  Administered 2019-03-08: 1000 mL via INTRAVENOUS

## 2019-03-08 MED ORDER — MIDAZOLAM HCL 5 MG/5ML IJ SOLN
1.0000 mg | INTRAMUSCULAR | Status: DC | PRN
  Administered 2019-03-08: 10:00:00 4 mg via INTRAVENOUS

## 2019-03-08 MED ORDER — ROPIVACAINE HCL 2 MG/ML IJ SOLN
2.0000 mL | Freq: Once | INTRAMUSCULAR | Status: AC
  Administered 2019-03-08: 12:00:00 2 mL

## 2019-03-08 MED ORDER — ROPIVACAINE HCL 2 MG/ML IJ SOLN
INTRAMUSCULAR | Status: AC
  Filled 2019-03-08: qty 10

## 2019-03-08 MED ORDER — LIDOCAINE HCL (PF) 1 % IJ SOLN
2.0000 mL | Freq: Once | INTRAMUSCULAR | Status: AC
  Administered 2019-03-08: 12:00:00 2 mL

## 2019-03-08 MED ORDER — LIDOCAINE HCL 2 % IJ SOLN
20.0000 mL | Freq: Once | INTRAMUSCULAR | Status: AC
  Administered 2019-03-08: 12:00:00 400 mg

## 2019-03-08 NOTE — Patient Instructions (Signed)

## 2019-03-08 NOTE — Progress Notes (Signed)
Patient's Name: Amber Acosta  MRN: [id number]  Referring Provider: Rayetta Humphrey, MD  DOB: September 06, 1951  PCP: Rayetta Humphrey, MD  DOS: 03/08/2019  Note by: Oswaldo Done, MD  Service setting: Ambulatory outpatient  Specialty: Interventional Pain Management  Patient type: Established  Location: [facility] (AMB) [facility] Management Facility  Visit type: Interventional Procedure   Primary Reason for Visit: Interventional Pain Management Treatment. CC: Headache and Neck Pain  Procedure:          Anesthesia, Analgesia, Anxiolysis:  Type: Diagnostic, Greater, Occipital Nerve Block           Region: Posterolateral Cervical Level: Occipital Ridge   Laterality: Left  Type: Moderate (Conscious) Sedation combined with Local Anesthesia Indication(s): Analgesia and Anxiety Route: Intravenous (IV) IV Access: Secured Sedation: Meaningful verbal contact was maintained at all times during the procedure  Local Anesthetic: Lidocaine 1-2%  Position: Prone   Indications: 1. Cervicogenic headache (Left)   2. Chronic Occipital neuralgia (Left)   3. Cervical facet syndrome (Left)    Pain Score: Pre-procedure: 2 /10 Post-procedure: 0-No pain/10  Pre-op Assessment:  [name] is a 68 y.o. (year old), female patient, seen today for interventional treatment. She  has a past surgical history that includes neck fusion; Breast surgery; Back surgery; Knee arthroscopy; Fracture surgery; Hand surgery; Abdominal hysterectomy; and Cyst excision. [name] has a current medication list which includes the following prescription(s): advair diskus, albuterol, amlodipine, b-d ins syr ultrafine 1cc/30g, b-d ins syr ultrafine 1cc/31g, fluoxetine, furosemide, gabapentin, klor-con 10, losartan, lovastatin, metoprolol succinate, nitroglycerin, omeprazole, oxycodone, oxycodone, oxycodone, ranitidine, tizanidine, hydralazine, insulin nph human, and trazodone, and the following Facility-Administered Medications: dexamethasone, fentanyl,  glycopyrrolate, lactated ringers, lidocaine (pf), lidocaine, midazolam, and ropivacaine (pf) 2 mg/ml (0.2%). Her primarily concern today is the Headache and Neck Pain  Initial Vital Signs:  Pulse/HCG Rate: 79ECG Heart Rate: (!) 102 Temp: 97.8 F (36.6 C) Resp: 18 BP: (!) 153/62 SpO2: 99 %  BMI: Estimated body mass index is 38.97 kg/m as calculated from the following:   Height as of this encounter:  (1.6 m).   Weight as of this encounter: 220 lb (99.8 kg).  Risk Assessment: Allergies: Reviewed. She is allergic to aspirin; ciprofloxacin; etanercept; metformin; methotrexate; mushroom extract complex; other; darvon [propoxyphene]; minocycline; and cobalamin combinations.  Allergy Precautions: None required Coagulopathies: Reviewed. None identified.  Blood-thinner therapy: None at this time Active Infection(s): Reviewed. None identified. [name] is afebrile  Site Confirmation: Ms. Lewan was asked to confirm the procedure and laterality before marking the site Procedure checklist: Completed Consent: Before the procedure and under the influence of no sedative(s), amnesic(s), or anxiolytics, the patient was informed of the treatment options, risks and possible complications. To fulfill our ethical and legal obligations, as recommended by the American Medical Association's Code of Ethics, I have informed the patient of my clinical impression; the nature and purpose of the treatment or procedure; the risks, benefits, and possible complications of the intervention; the alternatives, including doing nothing; the risk(s) and benefit(s) of the alternative treatment(s) or procedure(s); and the risk(s) and benefit(s) of doing nothing. The patient was provided information about the general risks and possible complications associated with the procedure. These may include, but are not limited to: failure to achieve desired goals, infection, bleeding, organ or nerve damage, allergic reactions, paralysis,  and death. In addition, the patient was informed of those risks and complications associated to the procedure, such as failure to decrease pain; infection; bleeding; organ or nerve  damage with subsequent damage to sensory, motor, and/or autonomic systems, resulting in permanent pain, numbness, and/or weakness of one or several areas of the body; allergic reactions; (i.e.: anaphylactic reaction); and/or death. Furthermore, the patient was informed of those risks and complications associated with the medications. These include, but are not limited to: allergic reactions (i.e.: anaphylactic or anaphylactoid reaction(s)); adrenal axis suppression; blood sugar elevation that in diabetics may result in ketoacidosis or comma; water retention that in patients with history of congestive heart failure may result in shortness of breath, pulmonary edema, and decompensation with resultant heart failure; weight gain; swelling or edema; medication-induced neural toxicity; particulate matter embolism and blood vessel occlusion with resultant organ, and/or nervous system infarction; and/or aseptic necrosis of one or more joints. Finally, the patient was informed that Medicine is not an exact science; therefore, there is also the possibility of unforeseen or unpredictable risks and/or possible complications that may result in a catastrophic outcome. The patient indicated having understood very clearly. We have given the patient no guarantees and we have made no promises. Enough time was given to the patient to ask questions, all of which were answered to the patient's satisfaction. Ms. Kamphaus has indicated that she wanted to continue with the procedure. Attestation: I, the ordering provider, attest that I have discussed with the patient the benefits, risks, side-effects, alternatives, likelihood of achieving goals, and potential problems during recovery for the procedure that I have provided informed consent. Date   Time:  03/08/2019  9:27 AM  Pre-Procedure Preparation:  Monitoring: As per clinic protocol. Respiration, ETCO2, SpO2, BP, heart rate and rhythm monitor placed and checked for adequate function Safety Precautions: Patient was assessed for positional comfort and pressure points before starting the procedure. Time-out: I initiated and conducted the "Time-out" before starting the procedure, as per protocol. The patient was asked to participate by confirming the accuracy of the "Time Out" information. Verification of the correct person, site, and procedure were performed and confirmed by me, the nursing staff, and the patient. "Time-out" conducted as per Joint Commission's Universal Protocol (UP.01.01.01). Time: 1022  Description of Procedure:          Target Area: Area medial to the occipital artery at the level of the superior nuchal ridge Approach: Posterior approach Area Prepped: Entire Posterior Occipital Region Prepping solution: ChloraPrep (2% chlorhexidine gluconate and 70% isopropyl alcohol) Safety Precautions: Aspiration looking for blood return was conducted prior to all injections. At no point did we inject any substances, as a needle was being advanced. No attempts were made at seeking any paresthesias. Safe injection practices and needle disposal techniques used. Medications properly checked for expiration dates. SDV (single dose vial) medications used. Description of the Procedure: Protocol guidelines were followed. The target area was identified and the area prepped in the usual manner. Skin & deeper tissues infiltrated with local anesthetic. Appropriate amount of time allowed to pass for local anesthetics to take effect. The procedure needles were then advanced to the target area. Proper needle placement secured. Negative aspiration confirmed. Solution injected in intermittent fashion, asking for systemic symptoms every 0.5cc of injectate. The needles were then removed and the area cleansed, making  sure to leave some of the prepping solution back to take advantage of its long term bactericidal properties.  Vitals:   03/08/19 1034 03/08/19 1044 03/08/19 1053 03/08/19 1054  BP: (!) 94/58 124/74  (!) 121/55  Pulse:      Resp: 18 19  18   Temp: 97.9 F (36.6 C)  98 F (36.7 C)   TempSrc:      SpO2: 100% 96%  99%  Weight:      Height:        Start Time: 1022 hrs. End Time: 1024 hrs. Materials:  Needle(s) Type: Spinal Needle Gauge: 22G Length: 3.5-in Medication(s): Please see orders for medications and dosing details.  Imaging Guidance (Non-Spinal):          Type of Imaging Technique: Fluoroscopy Guidance (Non-Spinal) Indication(s): Assistance in needle guidance and placement for procedures requiring needle placement in or near specific anatomical locations not easily accessible without such assistance. Exposure Time: Please see nurses notes. Contrast: None used. Fluoroscopic Guidance: I was personally present during the use of fluoroscopy. "Tunnel Vision Technique" used to obtain the best possible view of the target area. Parallax error corrected before commencing the procedure. "Direction-depth-direction" technique used to introduce the needle under continuous pulsed fluoroscopy. Once target was reached, antero-posterior, oblique, and lateral fluoroscopic projection used confirm needle placement in all planes. Images permanently stored in EMR. Interpretation: No contrast injected. I personally interpreted the imaging intraoperatively. Adequate needle placement confirmed in multiple planes. Permanent images saved into the patient's record.  Antibiotic Prophylaxis:   Anti-infectives (From admission, onward)   None     Indication(s): None identified  Post-operative Assessment:  Post-procedure Vital Signs:  Pulse/HCG Rate: 100(!) 101(Dr Adrijana Haros notified of increased Hr from baseline.  Robinal ) Temp: 98 F (36.7 C) Resp: 18 BP: (!) 121/55 SpO2: 99 %  EBL:  None  Complications: No immediate post-treatment complications observed by team, or reported by patient.  Note: The patient tolerated the entire procedure well. A repeat set of vitals were taken after the procedure and the patient was kept under observation following institutional policy, for this type of procedure. Post-procedural neurological assessment was performed, showing return to baseline, prior to discharge. The patient was provided with post-procedure discharge instructions, including a section on how to identify potential problems. Should any problems arise concerning this procedure, the patient was given instructions to immediately contact us, at any time, without hesitation. In any case, we plan to contact the patient by telephone for a follow-up status report regarding this interventional procedure.  Comments:  No additional relevant information.  Plan of Care  Orders:  Orders Placed This Encounter  Procedures   GREATER OCCIPITAL NERVE BLOCK    Scheduling Instructions:     Side: Left-sided     Sedation: With Sedation.     Timeframe: Today    Order Specific Question:   Where will this procedure be performed?    Answer:   [facility]   DG C-Arm 1-60 Min-No Report    Intraoperative interpretation by procedural physician at [facility].    Standing Status:   Standing    Number of Occurrences:   1    Order Specific Question:   Reason for exam:    Answer:   Assistance in needle guidance and placement for procedures requiring needle placement in or near specific anatomical locations not easily accessible without such assistance.   Provider attestation of informed consent for procedure/surgical case    I, the ordering provider, attest that I have discussed with the patient the benefits, risks, side effects, alternatives, likelihood of achieving goals and potential problems during recovery for the procedure that I have provided informed consent.    Standing  Status:   Standing    Number of Occurrences:   1   Informed Consent Details: Transcribe to consent  form and obtain patient signature    Consent Attestation: I, the ordering provider, attest that I have discussed with the patient the benefits, risks, side-effects, alternatives, likelihood of achieving goals, and potential problems during recovery for the procedure that I have provided informed consent.    Standing Status:   Standing    Number of Occurrences:   1    Order Specific Question:   Procedure    Answer:   Left-sided occipital nerve block under fluoroscopic guidance    Order Specific Question:   Surgeon    Answer:   Brynnley Dayrit A. Laban EmperorNaveira, MD    Order Specific Question:   Indication/Reason    Answer:   Left-sided occipital headaches secondary to left occipital neuralgia   Medications ordered for procedure: Meds ordered this encounter  Medications   lidocaine (XYLOCAINE) 2 % (with pres) injection 400 mg   lactated ringers infusion 1,000 mL   midazolam (VERSED) 5 MG/5ML injection 1-2 mg    Make sure Flumazenil is available in the pyxis when using this medication. If oversedation occurs, administer 0.2 mg IV over 15 sec. If after 45 sec no response, administer 0.2 mg again over 1 min; may repeat at 1 min intervals; not to exceed 4 doses (1 mg)   fentaNYL (SUBLIMAZE) injection 25-50 mcg    Make sure Narcan is available in the pyxis when using this medication. In the event of respiratory depression (RR< 8/min): Titrate NARCAN (naloxone) in increments of 0.1 to 0.2 mg IV at 2-3 minute intervals, until desired degree of reversal.   glycopyrrolate (ROBINUL) injection 0.2 mg   ropivacaine (PF) 2 mg/mL (0.2%) (NAROPIN) injection 2 mL   lidocaine (PF) (XYLOCAINE) 1 % injection 2 mL   dexamethasone (DECADRON) injection 10 mg   Medications administered: Kinda Choinski had no medications administered during this visit.  See the medical record for exact dosing, route, and time of  administration.  Disposition: Discharge home  Discharge Date & Time: 03/08/2019; 1055 hrs.   Follow-up plan:   Return for EPP (2 wks) w/ NP.     Future Appointments  Date Time Provider Department Center  03/22/2019  9:15 AM Barbette MerinoKing, Crystal M, NP [facility] None  05/26/2019 10:30 AM [facility]King[facility]   Primary Care Physician: Rayetta HumphreyGeorge, Sionne A, MD [facility] Note by: Oswaldo DoneFrancisco A Joselinne Lawal, MD Date: 03/08/2019; Time: 11:22 AM  Disclaimer:  Medicine is not an Visual merchandiserexact science. The only guarantee in medicine is that nothing is guaranteed. It is important to note that the decision to proceed with this intervention was based on the information collected from the patient. The Data and conclusions were drawn from the patient's questionnaire, the interview, and the physical examination. Because the information was provided in large part by the patient, it cannot be guaranteed that it has not been purposely or unconsciously manipulated. Every effort has been made to obtain as much relevant data as possible for this evaluation. It is important to note that the conclusions that lead to this procedure are derived in large part from the available data. Always take into account that the treatment will also be dependent on availability of resources and existing treatment guidelines, considered by other Pain Management Practitioners as being common knowledge and practice, at the time of the intervention. For Medico-Legal purposes, it is also important to point out that variation in procedural techniques and pharmacological choices are the acceptable norm. The indications, contraindications, technique, and results of the above procedure  should only be interpreted and judged by a Board-Certified Interventional Pain Specialist with extensive familiarity and expertise in the same exact procedure and technique.

## 2019-03-08 NOTE — Progress Notes (Signed)
Safety precautions to be maintained throughout the outpatient stay will include: orient to surroundings, keep bed in low position, maintain call bell within reach at all times, provide assistance with transfer out of bed and ambulation.  

## 2019-03-09 ENCOUNTER — Telehealth: Payer: Self-pay

## 2019-03-09 NOTE — Telephone Encounter (Signed)
Post procedure phone call.  Patient states she is doing great 

## 2019-03-22 ENCOUNTER — Other Ambulatory Visit: Payer: Self-pay

## 2019-03-22 ENCOUNTER — Ambulatory Visit: Payer: Medicare Other | Admitting: Nurse Practitioner

## 2019-03-23 ENCOUNTER — Other Ambulatory Visit: Payer: Self-pay

## 2019-03-23 ENCOUNTER — Ambulatory Visit: Payer: Medicare Other | Attending: Nurse Practitioner | Admitting: Nurse Practitioner

## 2019-04-24 ENCOUNTER — Other Ambulatory Visit: Payer: Self-pay | Admitting: Nurse Practitioner

## 2019-04-24 DIAGNOSIS — M7918 Myalgia, other site: Secondary | ICD-10-CM

## 2019-05-09 ENCOUNTER — Encounter: Payer: Medicare Other | Admitting: Nurse Practitioner

## 2019-05-24 ENCOUNTER — Encounter: Payer: Self-pay | Admitting: Pain Medicine

## 2019-05-25 ENCOUNTER — Other Ambulatory Visit: Payer: Self-pay

## 2019-05-25 ENCOUNTER — Ambulatory Visit: Payer: Medicare Other | Attending: Nurse Practitioner | Admitting: Pain Medicine

## 2019-05-25 DIAGNOSIS — G894 Chronic pain syndrome: Secondary | ICD-10-CM

## 2019-05-25 DIAGNOSIS — M792 Neuralgia and neuritis, unspecified: Secondary | ICD-10-CM

## 2019-05-25 DIAGNOSIS — M542 Cervicalgia: Secondary | ICD-10-CM

## 2019-05-25 DIAGNOSIS — M797 Fibromyalgia: Secondary | ICD-10-CM

## 2019-05-25 DIAGNOSIS — M25511 Pain in right shoulder: Secondary | ICD-10-CM

## 2019-05-25 DIAGNOSIS — M7918 Myalgia, other site: Secondary | ICD-10-CM

## 2019-05-25 DIAGNOSIS — M549 Dorsalgia, unspecified: Secondary | ICD-10-CM

## 2019-05-25 DIAGNOSIS — M545 Low back pain: Secondary | ICD-10-CM | POA: Diagnosis not present

## 2019-05-25 DIAGNOSIS — M25512 Pain in left shoulder: Secondary | ICD-10-CM

## 2019-05-25 DIAGNOSIS — M064 Inflammatory polyarthropathy: Secondary | ICD-10-CM

## 2019-05-25 DIAGNOSIS — Z789 Other specified health status: Secondary | ICD-10-CM

## 2019-05-25 DIAGNOSIS — M899 Disorder of bone, unspecified: Secondary | ICD-10-CM | POA: Insufficient documentation

## 2019-05-25 DIAGNOSIS — G8929 Other chronic pain: Secondary | ICD-10-CM

## 2019-05-25 DIAGNOSIS — Z79899 Other long term (current) drug therapy: Secondary | ICD-10-CM

## 2019-05-25 MED ORDER — TIZANIDINE HCL 4 MG PO TABS: 4.0000 mg | ORAL_TABLET | Freq: Two times a day (BID) | ORAL | 2 refills | Status: DC | PRN

## 2019-05-25 MED ORDER — GABAPENTIN 400 MG PO CAPS: 400.0000 mg | ORAL_CAPSULE | Freq: Three times a day (TID) | ORAL | 2 refills | Status: DC

## 2019-05-25 MED ORDER — OXYCODONE HCL 5 MG PO TABS: 5.0000 mg | ORAL_TABLET | Freq: Three times a day (TID) | ORAL | 0 refills | Status: DC | PRN

## 2019-05-25 NOTE — Progress Notes (Signed)
Pain Management Virtual Encounter Note - Virtual Visit via Telephone Telehealth (real-time audio visits between healthcare provider and patient).   Patient's Phone No. & Preferred Pharmacy:  [phone] (home); [phone] (mobile); (Preferred) [phone] [email or web address]  [id number] [place]- [place], [place] - [address] AT [facility] [address] [place] [id number] Phone: [phone] Fax: [phone]    Pre-screening note:  Our staff contacted Amber Acosta and offered her an "in person", "face-to-face" appointment versus a telephone encounter. She indicated preferring the telephone encounter, at this time.   Reason for Virtual Visit: COVID-19*  Social distancing based on CDC and AMA recommendations.   I contacted Amber Acosta on 05/25/2019 via telephone.      I clearly identified myself as Oswaldo DoneFrancisco A Demarrius Guerrero, MD. I verified that I was speaking with the correct person using two identifiers (Name: Amber Acosta, and date of birth: 10/27/1951).  Advanced Informed Consent I sought verbal advanced consent from Amber Nashenee Acosta for virtual visit interactions. I informed Amber Acosta of possible security and privacy concerns, risks, and limitations associated with providing "not-in-person" medical evaluation and management services. I also informed Amber Acosta of the availability of "in-person" appointments. Finally, I informed her that there would be a charge for the virtual visit and that she could be  personally, fully or partially, financially responsible for it. Amber Acosta expressed understanding and agreed to proceed.   Historic Elements   [name] is a 68 y.o. year old, female patient evaluated today after her last encounter by our practice on 04/24/2019. Amber Acosta  has a past medical history of Arthritis, Cervical spondylosis without myelopathy (09/21/2015), Chronic pain, Chronic pain associated with significant psychosocial dysfunction (08/28/2013),  Depression, Diabetes mellitus without complication (HCC), Displacement of cervical intervertebral disc without myelopathy (09/05/2015), Fibromyalgia, GERD (gastroesophageal reflux disease), Hypertension, Kidney disease, chronic, stage III (GFR 30-59 ml/min) (HCC), Sarcoid, and Thrombopenia (HCC). She also  has a past surgical history that includes neck fusion; Breast surgery; Back surgery; Knee arthroscopy; Fracture surgery; Hand surgery; Abdominal hysterectomy; and Cyst excision. Amber Acosta has a current medication list which includes the following prescription(s): advair diskus, albuterol, amlodipine, b-d ins syr ultrafine 1cc/30g, b-d ins syr ultrafine 1cc/31g, doxycycline, fluoxetine, furosemide, gabapentin, hydralazine, insulin nph human, insulin regular, klor-con 10, losartan, lovastatin, metoprolol succinate, nitroglycerin, omeprazole, oxycodone, oxycodone, oxycodone, tizanidine, and trazodone. She  reports that she has quit smoking. She has quit using smokeless tobacco. She reports that she does not drink alcohol or use drugs. Amber Acosta is allergic to aspirin; ciprofloxacin; etanercept; metformin; methotrexate; mushroom extract complex; other; darvon [propoxyphene]; minocycline; and cobalamin combinations.   HPI  Today, she is being contacted for medication management.  Pharmacotherapy Assessment  Analgesic: Oxycodone IR 5 mg every 8 hours (15 mg/day) MME/day:22.5 mg/day  Monitoring: Pharmacotherapy: No side-effects or adverse reactions reported. [place] PMP: PDMP reviewed during this encounter.       Compliance: No problems identified. Effectiveness: Clinically acceptable. Plan: Refer to "POC".  Post-Procedure Evaluation  Procedure: Diagnostic left greater occipital nerve block under fluoroscopic guidance and IV sedation Pre-procedure pain level:  2/10 Post-procedure: 2/10 (100% relief)  Sedation: Sedation provided.  Effectiveness during initial hour after procedure(Ultra-Short Term  Relief):   100%  Local anesthetic used: Long-acting (4-6 hours) Effectiveness: Defined as any analgesic benefit obtained secondary to the administration of local anesthetics. This carries significant diagnostic value as to the etiological location, or anatomical origin, of the pain. Duration of benefit is  expected to coincide with the duration of the local anesthetic used.  Effectiveness during initial 4-6 hours after procedure(Short-Term Relief):   100%  Long-term benefit: Defined as any relief past the pharmacologic duration of the local anesthetics.  Effectiveness past the initial 6 hours after procedure(Long-Term Relief):   100% (ongoing)  Current benefits: Defined as benefit that persist at this time.   Analgesia:  90-100% better Function: Amber Acosta reports improvement in function ROM: Amber Acosta reports improvement in ROM  Pertinent Labs   SAFETY SCREENING Profile No results found for: SARSCOV2NAA, COVIDSOURCE, STAPHAUREUS, MRSAPCR, HCVAB, HIV, PREGTESTUR Renal Function Lab Results  Component Value Date   BUN 14 03/26/2016   CREATININE 1.28 (H) 03/26/2016   GFRAA 50 (L) 03/26/2016   GFRNONAA 44 (L) 03/26/2016   Hepatic Function Lab Results  Component Value Date   AST 24 03/26/2016   ALT 20 03/26/2016   ALBUMIN 4.0 03/26/2016   UDS Summary  Date Value Ref Range Status  02/08/2019 FINAL  Final    Comment:    ==================================================================== TOXASSURE SELECT 13 (MW) ==================================================================== Test                             Result       Flag       Units Drug Present and Declared for Prescription Verification   Oxycodone                      1510         EXPECTED   ng/mg creat   Noroxycodone                   1960         EXPECTED   ng/mg creat    Sources of oxycodone include scheduled prescription medications.    Noroxycodone is an expected metabolite of  oxycodone. ==================================================================== Test                      Result    Flag   Units      Ref Range   Creatinine              82               mg/dL      >=16>=20 ==================================================================== Declared Medications:  The flagging and interpretation on this report are based on the  following declared medications.  Unexpected results may arise from  inaccuracies in the declared medications.  **Note: The testing scope of this panel includes these medications:  Oxycodone  **Note: The testing scope of this panel does not include following  reported medications:  Albuterol  Amlodipine Besylate  Fluoxetine  Fluticasone  Furosemide  Gabapentin  Hydralazine  Insulin  Insulin (Humulin)  Losartan (Losartan Potassium)  Lovastatin  Metoprolol  Nitroglycerin  Omeprazole  Potassium  Ranitidine  Salmeterol  Trazodone ==================================================================== For clinical consultation, please call [phone]. ====================================================================    Note: Above Lab results reviewed.  Recent imaging  DG C-Arm 1-60 Min-No Report Fluoroscopy was utilized by the requesting physician.  No radiographic  interpretation.   Assessment  The primary encounter diagnosis was Chronic pain syndrome. Diagnoses of Chronic low back pain (Primary Area of Pain) (Bilateral) (R>L), Chronic neck pain (Secondary area of Pain) (Bilateral) (L>R), Chronic shoulder pain (Third area of Pain) (Bilateral) (L>R), Fibromyalgia, Inflammatory polyarthropathy (HCC), Neurogenic pain, Myofascial pain, Pharmacologic therapy, Disorder of skeletal system,  and Problems influencing health status were also pertinent to this visit.  Plan of Care  I have discontinued Kearston Kirchoff's ranitidine, oxyCODONE, and oxyCODONE. I have also changed her oxyCODONE. Additionally, I am having her start on  oxyCODONE and oxyCODONE. Lastly, I am having her maintain her lovastatin, traZODone, B-D INS SYR ULTRAFINE 1CC/30G, losartan, nitroGLYCERIN, FLUoxetine, amLODipine, albuterol, metoprolol succinate, Klor-Con 10, hydrALAZINE, furosemide, B-D INS SYR ULTRAFINE 1CC/31G, insulin NPH Human, Advair Diskus, omeprazole, doxycycline, insulin regular, tiZANidine, and gabapentin.  Pharmacotherapy (Medications Ordered): Meds ordered this encounter  Medications  . tiZANidine (ZANAFLEX) 4 MG tablet    Sig: Take 1 tablet (4 mg total) by mouth 2 (two) times daily as needed for muscle spasms.    Dispense:  60 tablet    Refill:  2    Fill one day early if pharmacy is closed on scheduled refill date. May substitute for generic if available.  . gabapentin (NEURONTIN) 400 MG capsule    Sig: Take 1 capsule (400 mg total) by mouth 3 (three) times daily.    Dispense:  90 capsule    Refill:  2    Fill one day early if pharmacy is closed on scheduled refill date. May substitute for generic if available.  Marland Kitchen oxyCODONE (OXY IR/ROXICODONE) 5 MG immediate release tablet    Sig: Take 1 tablet (5 mg total) by mouth every 8 (eight) hours as needed for up to 30 days for severe pain. Must last 30 days    Dispense:  90 tablet    Refill:  0    Chronic Pain: STOP Act (Not applicable) Fill 1 day early if closed on refill date. Do not fill until: 06/02/2019. To last until: 07/02/2019. Avoid benzodiazepines within 8 hours of opioids  . oxyCODONE (OXY IR/ROXICODONE) 5 MG immediate release tablet    Sig: Take 1 tablet (5 mg total) by mouth every 8 (eight) hours as needed for up to 30 days for severe pain. Must last 30 days    Dispense:  90 tablet    Refill:  0    Chronic Pain: STOP Act (Not applicable) Fill 1 day early if closed on refill date. Do not fill until: 07/02/2019. To last until: 08/01/2019. Avoid benzodiazepines within 8 hours of opioids  . oxyCODONE (OXY IR/ROXICODONE) 5 MG immediate release tablet    Sig: Take 1 tablet (5 mg  total) by mouth every 8 (eight) hours as needed for up to 30 days for severe pain. Must last 30 days    Dispense:  90 tablet    Refill:  0    Chronic Pain: STOP Act (Not applicable) Fill 1 day early if closed on refill date. Do not fill until: 08/01/2019. To last until: 08/31/2019. Avoid benzodiazepines within 8 hours of opioids   Orders:  Orders Placed This Encounter  Procedures  . ToxASSURE Select 13 (MW), Urine    Volume: 30 ml(s). Minimum 3 ml of urine is needed. Document temperature of fresh sample. Indications: Long term (current) use of opiate analgesic (Z79.891)  . Comp. Metabolic Panel (12)    With GFR. Indications: Chronic Pain Syndrome (G89.4) & Pharmacotherapy (C62.376)    Order Specific Question:   Has the patient fasted?    Answer:   No    Order Specific Question:   CC Results    Answer:   PCP-NURSE [[id number]]  . Magnesium    Indication: Pharmacologic therapy (V61.607)    Order Specific Question:   CC Results    Answer:  PCP-NURSE [id number]]  . Vitamin B12    Indication: Pharmacologic therapy (U27.253(Z79.899).    Order Specific Question:   CC Results    Answer:   PCP-NURSE [[id number]]  . Sedimentation rate    Indication: Disorder of skeletal system (M89.9)    Order Specific Question:   CC Results    Answer:   PCP-NURSE [[id number]][[id number]]  . 25-Hydroxyvitamin D Lcms D2+D3    Indication: Disorder of skeletal system (M89.9).    Order Specific Question:   CC Results    Answer:   PCP-NURSE [[id number]]  . C-reactive protein    Indication: Problems influencing health status (Z78.9)    Order Specific Question:   CC Results    Answer:   PCP-NURSE [[id number]][[id number]]   Follow-up plan:   Return in 3 months (on 08/22/2019) for (VV), E/M (MM).    Recent Visits Date Type Provider Dept  03/08/19 Procedure visit Delano MetzNaveira, Alanzo Lamb, MD [facility]  03/01/19 Office Visit Barbette MerinoKing, Crystal M, NP [facility]  Showing recent visits within past 90 days and meeting all other requirements   Today's  Visits Date Type Provider Dept  05/25/19 Office Visit Delano MetzNaveira, Zed Wanninger, MD [facility]  Showing today's visits and meeting all other requirements   Future Appointments Date Type Provider Dept  08/22/19 Appointment Delano MetzNaveira, Jenasia Dolinar, MD [facility]  Showing future appointments within next 90 days and meeting all other requirements   I discussed the assessment and treatment plan with the patient. The patient was provided an opportunity to ask questions and all were answered. The patient agreed with the plan and demonstrated an understanding of the instructions.  Patient advised to call back or seek an in-person evaluation if the symptoms or condition worsens.  Total duration of non-face-to-face encounter: 13 minutes.  Note by: Oswaldo DoneFrancisco A Katherina Wimer, MD Date: 05/25/2019; Time: 11:11 AM  Note: This dictation was prepared with Dragon dictation. Any transcriptional errors that may result from this process are unintentional.  Disclaimer:  * Given the special circumstances of the COVID-19 pandemic, the federal government has announced that the Office for Civil Rights (OCR) will exercise its enforcement discretion and will not impose penalties on physicians using telehealth in the event of noncompliance with regulatory requirements under the [employer] Insurance Portability and Accountability Act (HIPAA) in connection with the good faith provision of telehealth during the COVID-19 national public health emergency. (AMA)

## 2019-05-25 NOTE — Patient Instructions (Signed)
____________________________________________________________________________________________  Medication Recommendations and Reminders  Applies to: All patients receiving prescriptions (written and/or electronic).  Medication Rules & Regulations: These rules and regulations exist for your safety and that of others. They are not flexible and neither are we. Dismissing or ignoring them will be considered "non-compliance" with medication therapy, resulting in complete and irreversible termination of such therapy. (See document titled "Medication Rules" for more details.) In all conscience, because of safety reasons, we cannot continue providing a therapy where the patient does not follow instructions.  Pharmacy of record:   Definition: This is the pharmacy where your electronic prescriptions will be sent.   We do not endorse any particular pharmacy.  You are not restricted in your choice of pharmacy.  The pharmacy listed in the electronic medical record should be the one where you want electronic prescriptions to be sent.  If you choose to change pharmacy, simply notify our nursing staff of your choice of new pharmacy.  Recommendations:  Keep all of your pain medications in a safe place, under lock and key, even if you live alone.   After you fill your prescription, take 1 week's worth of pills and put them away in a safe place. You should keep a separate, properly labeled bottle for this purpose. The remainder should be kept in the original bottle. Use this as your primary supply, until it runs out. Once it's gone, then you know that you have 1 week's worth of medicine, and it is time to come in for a prescription refill. If you do this correctly, it is unlikely that you will ever run out of medicine.  To make sure that the above recommendation works, it is very important that you make sure your medication refill appointments are scheduled at least 1 week before you run out of medicine. To do  this in an effective manner, make sure that you do not leave the office without scheduling your next medication management appointment. Always ask the nursing staff to show you in your prescription , when your medication will be running out. Then arrange for the receptionist to get you a return appointment, at least 7 days before you run out of medicine. Do not wait until you have 1 or 2 pills left, to come in. This is very poor planning and does not take into consideration that we may need to cancel appointments due to bad weather, sickness, or emergencies affecting our staff.  "Partial Fill": If for any reason your pharmacy does not have enough pills/tablets to completely fill or refill your prescription, do not allow for a "partial fill". You will need a separate prescription to fill the remaining amount, which we will not provide. If the reason for the partial fill is your insurance, you will need to talk to the pharmacist about payment alternatives for the remaining tablets, but again, do not accept a partial fill.  Prescription refills and/or changes in medication(s):   Prescription refills, and/or changes in dose or medication, will be conducted only during scheduled medication management appointments. (Applies to both, written and electronic prescriptions.)  No refills on procedure days. No medication will be changed or started on procedure days. No changes, adjustments, and/or refills will be conducted on a procedure day. Doing so will interfere with the diagnostic portion of the procedure.  No phone refills. No medications will be "called into the pharmacy".  No Fax refills.  No weekend refills.  No Holliday refills.  No after hours refills.  Remember:  Business   hours are:  Monday to Thursday 8:00 AM to 4:00 PM Provider's Schedule: Crystal King, NP - Appointments are:  Medication management: Monday to Thursday 8:00 AM to 4:00 PM Mireille Lacombe, MD - Appointments are:   Medication management: Monday and Wednesday 8:00 AM to 4:00 PM Procedure day: Tuesday and Thursday 7:30 AM to 4:00 PM Bilal Lateef, MD - Appointments are:  Medication management: Tuesday and Thursday 8:00 AM to 4:00 PM Procedure day: Monday and Wednesday 7:30 AM to 4:00 PM (Last update: 01/28/2018) ____________________________________________________________________________________________    

## 2019-05-26 ENCOUNTER — Encounter: Payer: Medicare Other | Admitting: Nurse Practitioner

## 2019-07-19 ENCOUNTER — Encounter: Payer: Self-pay | Admitting: Pain Medicine

## 2019-07-19 ENCOUNTER — Ambulatory Visit
Admission: RE | Admit: 2019-07-19 | Discharge: 2019-07-19 | Disposition: A | Discharge status: Home / Self Care | Payer: Medicare Other | Source: Ambulatory Visit | Attending: Pain Medicine | Admitting: Pain Medicine

## 2019-07-19 ENCOUNTER — Ambulatory Visit (HOSPITAL_BASED_OUTPATIENT_CLINIC_OR_DEPARTMENT_OTHER): Payer: Medicare Other | Admitting: Pain Medicine

## 2019-07-19 ENCOUNTER — Other Ambulatory Visit: Payer: Self-pay

## 2019-07-19 VITALS — BP 104/59 | HR 70 | Temp 97.1°F | Resp 18 | Ht 63.0 in | Wt 220.0 lb

## 2019-07-19 DIAGNOSIS — M47812 Spondylosis without myelopathy or radiculopathy, cervical region: Secondary | ICD-10-CM | POA: Insufficient documentation

## 2019-07-19 DIAGNOSIS — M5481 Occipital neuralgia: Secondary | ICD-10-CM | POA: Insufficient documentation

## 2019-07-19 DIAGNOSIS — M542 Cervicalgia: Secondary | ICD-10-CM

## 2019-07-19 DIAGNOSIS — G8929 Other chronic pain: Secondary | ICD-10-CM

## 2019-07-19 DIAGNOSIS — M549 Dorsalgia, unspecified: Secondary | ICD-10-CM | POA: Insufficient documentation

## 2019-07-19 DIAGNOSIS — G4486 Cervicogenic headache: Secondary | ICD-10-CM

## 2019-07-19 DIAGNOSIS — R55 Syncope and collapse: Secondary | ICD-10-CM | POA: Diagnosis present

## 2019-07-19 DIAGNOSIS — R51 Headache: Secondary | ICD-10-CM | POA: Diagnosis present

## 2019-07-19 MED ORDER — GLYCOPYRROLATE 0.2 MG/ML IJ SOLN
INTRAMUSCULAR | Status: AC
  Filled 2019-07-19: qty 1

## 2019-07-19 MED ORDER — GLYCOPYRROLATE 0.2 MG/ML IJ SOLN
0.2000 mg | Freq: Once | INTRAMUSCULAR | Status: AC
  Administered 2019-07-19: 09:00:00 0.2 mg via INTRAVENOUS

## 2019-07-19 MED ORDER — ROPIVACAINE HCL 2 MG/ML IJ SOLN
9.0000 mL | Freq: Once | INTRAMUSCULAR | Status: AC
  Administered 2019-07-19: 9 mL

## 2019-07-19 MED ORDER — ROPIVACAINE HCL 2 MG/ML IJ SOLN
INTRAMUSCULAR | Status: AC
  Filled 2019-07-19: qty 10

## 2019-07-19 MED ORDER — MIDAZOLAM HCL 5 MG/5ML IJ SOLN
INTRAMUSCULAR | Status: AC
  Filled 2019-07-19: qty 5

## 2019-07-19 MED ORDER — FENTANYL CITRATE (PF) 100 MCG/2ML IJ SOLN
25.0000 ug | INTRAMUSCULAR | Status: DC | PRN
  Administered 2019-07-19: 50 ug via INTRAVENOUS

## 2019-07-19 MED ORDER — LIDOCAINE HCL 2 % IJ SOLN
INTRAMUSCULAR | Status: AC
  Filled 2019-07-19: qty 20

## 2019-07-19 MED ORDER — DEXAMETHASONE SODIUM PHOSPHATE 10 MG/ML IJ SOLN
INTRAMUSCULAR | Status: AC
  Filled 2019-07-19: qty 1

## 2019-07-19 MED ORDER — FENTANYL CITRATE (PF) 100 MCG/2ML IJ SOLN
INTRAMUSCULAR | Status: AC
  Filled 2019-07-19: qty 2

## 2019-07-19 MED ORDER — DEXAMETHASONE SODIUM PHOSPHATE 10 MG/ML IJ SOLN
10.0000 mg | Freq: Once | INTRAMUSCULAR | Status: AC
  Administered 2019-07-19: 10 mg

## 2019-07-19 MED ORDER — LACTATED RINGERS IV SOLN
1000.0000 mL | Freq: Once | INTRAVENOUS | Status: AC
  Administered 2019-07-19: 09:00:00 1000 mL via INTRAVENOUS

## 2019-07-19 MED ORDER — LIDOCAINE HCL 2 % IJ SOLN
20.0000 mL | Freq: Once | INTRAMUSCULAR | Status: AC
  Administered 2019-07-19: 400 mg

## 2019-07-19 MED ORDER — MIDAZOLAM HCL 5 MG/5ML IJ SOLN
1.0000 mg | INTRAMUSCULAR | Status: DC | PRN
  Administered 2019-07-19: 2 mg via INTRAVENOUS

## 2019-07-19 NOTE — Progress Notes (Signed)
Patient's Name: Amber Acosta  MRN: [id number]  Referring Provider: Delano MetzNaveira, Tycen Dockter, MD  DOB: 10/25/1951  PCP: Rayetta HumphreyGeorge, Sionne A, MD  DOS: 07/19/2019  Note by: Oswaldo DoneFrancisco A Aviah Sorci, MD  Service setting: Ambulatory outpatient  Specialty: Interventional Pain Management  Patient type: Established  Location: [facility] (AMB) [facility] Facility  Visit type: Interventional Procedure   Primary Reason for Visit: Interventional Pain Management Treatment. CC: Pain (headaches)  Procedure:          Anesthesia, Analgesia, Anxiolysis:  Type: Palliative, Greater, Occipital Nerve Block  #5  Region: Posterolateral Cervical Level: Occipital Ridge   Laterality: Bilateral  Type: Moderate (Conscious) Sedation combined with Local Anesthesia Indication(s): Analgesia and Anxiety Route: Intravenous (IV) IV Access: Secured Sedation: Meaningful verbal contact was maintained at all times during the procedure  Local Anesthetic: Lidocaine 1-2%  Position: Prone   Indications: 1. Chronic Occipital neuralgia  (Bilateral)  2. Cervicogenic headache  (Bilateral)  3. Cervicalgia  (Bilateral)  4. Cervical facet syndrome (Left)   5. History of vasovagal episode    Pain Score: Pre-procedure: 8 /10 Post-procedure: 0-No pain/10   Pre-op Assessment:  Amber Acosta is a 68 y.o. (year old), female patient, seen today for interventional treatment. She  has a past surgical history that includes neck fusion; Breast surgery; Back surgery; Knee arthroscopy; Fracture surgery; Hand surgery; Abdominal hysterectomy; and Cyst excision. Amber Acosta has a current medication list which includes the following prescription(s): advair diskus, albuterol, amlodipine, b-d ins syr ultrafine 1cc/30g, b-d ins syr ultrafine 1cc/31g, doxycycline, fluoxetine, furosemide, gabapentin, hydralazine, insulin regular, klor-con 10, losartan, lovastatin, metoprolol succinate, nitroglycerin, omeprazole, oxycodone, oxycodone, tizanidine, trazodone, insulin nph human, and  oxycodone, and the following Facility-Administered Medications: fentanyl and midazolam. Her primarily concern today is the Pain (headaches)  Initial Vital Signs:  Pulse/HCG Rate: 67ECG Heart Rate: 72 Temp: 98.1 F (36.7 C) Resp: 18 BP: (!) 115/46 SpO2: 98 %  BMI: Estimated body mass index is 38.97 kg/m as calculated from the following:   Height as of this encounter: 5\' 3"  (1.6 m).   Weight as of this encounter: 220 lb (99.8 kg).  Risk Assessment: Allergies: Reviewed. She is allergic to aspirin; ciprofloxacin; etanercept; metformin; methotrexate; mushroom extract complex; other; darvon [propoxyphene]; minocycline; and cobalamin combinations.  Allergy Precautions: None required Coagulopathies: Reviewed. None identified.  Blood-thinner therapy: None at this time Active Infection(s): Reviewed. None identified. Amber Acosta is afebrile  Site Confirmation: Amber Acosta was asked to confirm the procedure and laterality before marking the site Procedure checklist: Completed Consent: Before the procedure and under the influence of no sedative(s), amnesic(s), or anxiolytics, the patient was informed of the treatment options, risks and possible complications. To fulfill our ethical and legal obligations, as recommended by the American Medical Association's Code of Ethics, I have informed the patient of my clinical impression; the nature and purpose of the treatment or procedure; the risks, benefits, and possible complications of the intervention; the alternatives, including doing nothing; the risk(s) and benefit(s) of the alternative treatment(s) or procedure(s); and the risk(s) and benefit(s) of doing nothing. The patient was provided information about the general risks and possible complications associated with the procedure. These may include, but are not limited to: failure to achieve desired goals, infection, bleeding, organ or nerve damage, allergic reactions, paralysis, and death. In addition, the  patient was informed of those risks and complications associated to the procedure, such as failure to decrease pain; infection; bleeding; organ or nerve damage with subsequent damage to sensory, motor, and/or autonomic systems,  resulting in permanent pain, numbness, and/or weakness of one or several areas of the body; allergic reactions; (i.e.: anaphylactic reaction); and/or death. Furthermore, the patient was informed of those risks and complications associated with the medications. These include, but are not limited to: allergic reactions (i.e.: anaphylactic or anaphylactoid reaction(s)); adrenal axis suppression; blood sugar elevation that in diabetics may result in ketoacidosis or comma; water retention that in patients with history of congestive heart failure may result in shortness of breath, pulmonary edema, and decompensation with resultant heart failure; weight gain; swelling or edema; medication-induced neural toxicity; particulate matter embolism and blood vessel occlusion with resultant organ, and/or nervous system infarction; and/or aseptic necrosis of one or more joints. Finally, the patient was informed that Medicine is not an exact science; therefore, there is also the possibility of unforeseen or unpredictable risks and/or possible complications that may result in a catastrophic outcome. The patient indicated having understood very clearly. We have given the patient no guarantees and we have made no promises. Enough time was given to the patient to ask questions, all of which were answered to the patient's satisfaction. Amber Acosta has indicated that she wanted to continue with the procedure. Attestation: I, the ordering provider, attest that I have discussed with the patient the benefits, risks, side-effects, alternatives, likelihood of achieving goals, and potential problems during recovery for the procedure that I have provided informed consent. Date   Time: 07/19/2019  8:39 AM  Pre-Procedure  Preparation:  Monitoring: As per clinic protocol. Respiration, ETCO2, SpO2, BP, heart rate and rhythm monitor placed and checked for adequate function Safety Precautions: Patient was assessed for positional comfort and pressure points before starting the procedure. Time-out: I initiated and conducted the "Time-out" before starting the procedure, as per protocol. The patient was asked to participate by confirming the accuracy of the "Time Out" information. Verification of the correct person, site, and procedure were performed and confirmed by me, the nursing staff, and the patient. "Time-out" conducted as per Joint Commission's Universal Protocol (UP.01.01.01). Time: 0926  Description of Procedure:          Target Area: Area medial to the occipital artery at the level of the superior nuchal ridge Approach: Posterior approach Area Prepped: Entire Posterior Occipital Region Prepping solution: DuraPrep (Iodine Povacrylex [0.7% available iodine] and Isopropyl Alcohol, 74% w/w) Safety Precautions: Aspiration looking for blood return was conducted prior to all injections. At no point did we inject any substances, as a needle was being advanced. No attempts were made at seeking any paresthesias. Safe injection practices and needle disposal techniques used. Medications properly checked for expiration dates. SDV (single dose vial) medications used. Description of the Procedure: Protocol guidelines were followed. The target area was identified and the area prepped in the usual manner. Skin & deeper tissues infiltrated with local anesthetic. Appropriate amount of time allowed to pass for local anesthetics to take effect. The procedure needles were then advanced to the target area. Proper needle placement secured. Negative aspiration confirmed. Solution injected in intermittent fashion, asking for systemic symptoms every 0.5cc of injectate. The needles were then removed and the area cleansed, making sure to leave  some of the prepping solution back to take advantage of its long term bactericidal properties.  Vitals:   07/19/19 0930 07/19/19 0940 07/19/19 0950 07/19/19 1000  BP: 90/61 (!) 118/51 (!) 106/52 (!) 104/59  Pulse: 74 70    Resp: 15 16 18 18   Temp:    (!) 97.1 F (36.2 C)  TempSrc:  Temporal  SpO2: 96% 95% 96% 95%  Weight:      Height:        Start Time: 0926 hrs. End Time: 0930 hrs. Materials:  Needle(s) Type: Spinal Needle Gauge: 22G Length: 3.5-in Medication(s): Please see orders for medications and dosing details.  Imaging Guidance (Non-Spinal):          Type of Imaging Technique: Fluoroscopy Guidance (Non-Spinal) Indication(s): Assistance in needle guidance and placement for procedures requiring needle placement in or near specific anatomical locations not easily accessible without such assistance. Exposure Time: Please see nurses notes. Contrast: None used. Fluoroscopic Guidance: I was personally present during the use of fluoroscopy. "Tunnel Vision Technique" used to obtain the best possible view of the target area. Parallax error corrected before commencing the procedure. "Direction-depth-direction" technique used to introduce the needle under continuous pulsed fluoroscopy. Once target was reached, antero-posterior, oblique, and lateral fluoroscopic projection used confirm needle placement in all planes. Images permanently stored in EMR. Interpretation: No contrast injected. I personally interpreted the imaging intraoperatively. Adequate needle placement confirmed in multiple planes. Permanent images saved into the patient's record.  Antibiotic Prophylaxis:   Anti-infectives (From admission, onward)   None     Indication(s): None identified  Post-operative Assessment:  Post-procedure Vital Signs:  Pulse/HCG Rate: 7074 Temp: (!) 97.1 F (36.2 C) Resp: 18 BP: (!) 104/59 SpO2: 95 %  EBL: None  Complications: No immediate post-treatment complications observed by  team, or reported by patient.  Note: The patient tolerated the entire procedure well. A repeat set of vitals were taken after the procedure and the patient was kept under observation following institutional policy, for this type of procedure. Post-procedural neurological assessment was performed, showing return to baseline, prior to discharge. The patient was provided with post-procedure discharge instructions, including a section on how to identify potential problems. Should any problems arise concerning this procedure, the patient was given instructions to immediately contact us, at any time, without hesitation. In any case, we plan to contact the patient by telephone for a follow-up status report regarding this interventional procedure.  Comments:  No additional relevant information.  Plan of Care  Orders:  Orders Placed This Encounter  Procedures   GREATER OCCIPITAL NERVE BLOCK    Scheduling Instructions:     Side: Bilateral     Sedation: With Sedation.     Timeframe: Today    Order Specific Question:   Where will this procedure be performed?    Answer:   [facility]   DG [facility] C-ARM 1-60 MIN NO REPORT    Intraoperative interpretation by procedural physician at [facility].    Standing Status:   Standing    Number of Occurrences:   1    Order Specific Question:   Reason for exam:    Answer:   Assistance in needle guidance and placement for procedures requiring needle placement in or near specific anatomical locations not easily accessible without such assistance.   Provider attestation of informed consent for procedure/surgical case    I, the ordering provider, attest that I have discussed with the patient the benefits, risks, side effects, alternatives, likelihood of achieving goals and potential problems during recovery for the procedure that I have provided informed consent.    Standing Status:   Standing    Number of Occurrences:   1   Informed Consent  Details: Transcribe to consent form and obtain patient signature    Consent Attestation: I, the ordering provider, attest that I  have discussed with the patient the benefits, risks, side-effects, alternatives, likelihood of achieving goals, and potential problems during recovery for the procedure that I have provided informed consent.    Standing Status:   Standing    Number of Occurrences:   1    Order Specific Question:   Procedure    Answer:   Bilateral occipital nerve block under fluoroscopic guidance    Order Specific Question:   Surgeon    Answer:   Elky Funches A. Laban EmperorNaveira, MD    Order Specific Question:   Indication/Reason    Answer:   Bilateral occipital headaches secondary to left occipital neuralgia   Chronic Opioid Analgesic:  Oxycodone IR 5 mg, 1 tab PO q 8 hrs (15 mg/day of oxycodone) MME/day:22.5 mg/day.   Medications ordered for procedure: Meds ordered this encounter  Medications   lidocaine (XYLOCAINE) 2 % (with pres) injection 400 mg   lactated ringers infusion 1,000 mL   glycopyrrolate (ROBINUL) injection 0.2 mg   midazolam (VERSED) 5 MG/5ML injection 1-2 mg    Make sure Flumazenil is available in the pyxis when using this medication. If oversedation occurs, administer 0.2 mg IV over 15 sec. If after 45 sec no response, administer 0.2 mg again over 1 min; may repeat at 1 min intervals; not to exceed 4 doses (1 mg)   fentaNYL (SUBLIMAZE) injection 25-50 mcg    Make sure Narcan is available in the pyxis when using this medication. In the event of respiratory depression (RR< 8/min): Titrate NARCAN (naloxone) in increments of 0.1 to 0.2 mg IV at 2-3 minute intervals, until desired degree of reversal.   ropivacaine (PF) 2 mg/mL (0.2%) (NAROPIN) injection 9 mL   dexamethasone (DECADRON) injection 10 mg   Medications administered: We administered lidocaine, lactated ringers, glycopyrrolate, midazolam, fentaNYL, ropivacaine (PF) 2 mg/mL (0.2%), and dexamethasone.  See  the medical record for exact dosing, route, and time of administration.  Follow-up plan:   Return for (VV), 2 wk PP-F/U Eval.       Interventional management options:  Considering: NOTE: NO RFA(Poor candidate due to delayed sensory perceptionand intra-procedural non-compliance.) No additional procedures planned at this time.   Prior & Palliative treatment(s): Palliative bilateral lumbar facet block #3(NO RFA)(for low back pain) Palliative left CESI #3  Palliative right CESI #2  Palliative left-sided C2 + TON #2  Palliativeleft cervical facet block(NO RFA)(for left-sided neck pain)  Palliative left cervical facet RFA #3 (last done 05/04/2018) (not to be repeated due to delayed sensory perceptionand intra-procedural non-compliance.)(Poor candidate) Palliative left GONB #6(NORFA) (for left-sided headaches)    Recent Visits Date Type Provider Dept  05/25/19 Office Visit Delano MetzNaveira, Gaylia Kassel, MD [facility]  Showing recent visits within past 90 days and meeting all other requirements   Today's Visits Date Type Provider Dept  07/19/19 Procedure visit Delano MetzNaveira, Graylon Amory, MD [facility]  Showing today's visits and meeting all other requirements   Future Appointments Date Type Provider Dept  08/22/19 Appointment Delano MetzNaveira, Aubry Tucholski, MD [facility]  Showing future appointments within next 90 days and meeting all other requirements   Disposition: Discharge home  Discharge Date & Time: 07/19/2019; 1000 hrs.   Primary Care Physician: Rayetta HumphreyGeorge, Sionne A, MD [facility] Note by: Oswaldo DoneFrancisco A Kelsy Polack, MD Date: 07/19/2019; Time: 10:49 AM  Disclaimer:  Medicine is not an Visual merchandiserexact science. The only guarantee in medicine is that nothing is guaranteed. It is important to note that the decision to proceed with this intervention  was based on the information collected from the patient. The Data and conclusions were drawn  from the patient's questionnaire, the interview, and the physical examination. Because the information was provided in large part by the patient, it cannot be guaranteed that it has not been purposely or unconsciously manipulated. Every effort has been made to obtain as much relevant data as possible for this evaluation. It is important to note that the conclusions that lead to this procedure are derived in large part from the available data. Always take into account that the treatment will also be dependent on availability of resources and existing treatment guidelines, considered by other Pain Management Practitioners as being common knowledge and practice, at the time of the intervention. For Medico-Legal purposes, it is also important to point out that variation in procedural techniques and pharmacological choices are the acceptable norm. The indications, contraindications, technique, and results of the above procedure should only be interpreted and judged by a Board-Certified Interventional Pain Specialist with extensive familiarity and expertise in the same exact procedure and technique.

## 2019-07-19 NOTE — Progress Notes (Signed)
Safety precautions to be maintained throughout the outpatient stay will include: orient to surroundings, keep bed in low position, maintain call bell within reach at all times, provide assistance with transfer out of bed and ambulation.  

## 2019-07-19 NOTE — Patient Instructions (Addendum)
____________________________________________________________________________________________  Post-Procedure Discharge Instructions  Instructions:  Apply ice:   Purpose: This will minimize any swelling and discomfort after procedure.   When: Day of procedure, as soon as you get home.  How: Fill a plastic sandwich bag with crushed ice. Cover it with a small towel and apply to injection site.  How long: (15 min on, 15 min off) Apply for 15 minutes then remove x 15 minutes.  Repeat sequence on day of procedure, until you go to bed.  Apply heat:   Purpose: To treat any soreness and discomfort from the procedure.  When: Starting the next day after the procedure.  How: Apply heat to procedure site starting the day following the procedure.  How long: May continue to repeat daily, until discomfort goes away.  Food intake: Start with clear liquids (like water) and advance to regular food, as tolerated.   Physical activities: Keep activities to a minimum for the first 8 hours after the procedure. After that, then as tolerated.  Driving: If you have received any sedation, be responsible and do not drive. You are not allowed to drive for 24 hours after having sedation.  Blood thinner: (Applies only to those taking blood thinners) You may restart your blood thinner 6 hours after your procedure.  Insulin: (Applies only to Diabetic patients taking insulin) As soon as you can eat, you may resume your normal dosing schedule.  Infection prevention: Keep procedure site clean and dry. Shower daily and clean area with soap and water.  Post-procedure Pain Diary: Extremely important that this be done correctly and accurately. Recorded information will be used to determine the next step in treatment. For the purpose of accuracy, follow these rules:  Evaluate only the area treated. Do not report or include pain from an untreated area. For the purpose of this evaluation, ignore all other areas of pain,  except for the treated area.  After your procedure, avoid taking a long nap and attempting to complete the pain diary after you wake up. Instead, set your alarm clock to go off every hour, on the hour, for the initial 8 hours after the procedure. Document the duration of the numbing medicine, and the relief you are getting from it.  Do not go to sleep and attempt to complete it later. It will not be accurate. If you received sedation, it is likely that you were given a medication that may cause amnesia. Because of this, completing the diary at a later time may cause the information to be inaccurate. This information is needed to plan your care.  Follow-up appointment: Keep your post-procedure follow-up evaluation appointment after the procedure (usually 2 weeks for most procedures, 6 weeks for radiofrequencies). DO NOT FORGET to bring you pain diary with you.   Expect: (What should I expect to see with my procedure?)  From numbing medicine (AKA: Local Anesthetics): Numbness or decrease in pain. You may also experience some weakness, which if present, could last for the duration of the local anesthetic.  Onset: Full effect within 15 minutes of injected.  Duration: It will depend on the type of local anesthetic used. On the average, 1 to 8 hours.   From steroids (Applies only if steroids were used): Decrease in swelling or inflammation. Once inflammation is improved, relief of the pain will follow.  Onset of benefits: Depends on the amount of swelling present. The more swelling, the longer it will take for the benefits to be seen. In some cases, up to 10 days.    Duration: Steroids will stay in the system x 2 weeks. Duration of benefits will depend on multiple posibilities including persistent irritating factors.  Side-effects: If present, they may typically last 2 weeks (the duration of the steroids).  Frequent: Cramps (if they occur, drink Gatorade and take over-the-counter Magnesium 450-500 mg  once to twice a day); water retention with temporary weight gain; increases in blood sugar; decreased immune system response; increased appetite.  Occasional: Facial flushing (red, warm cheeks); mood swings; menstrual changes.  Uncommon: Long-term decrease or suppression of natural hormones; bone thinning. (These are more common with higher doses or more frequent use. This is why we prefer that our patients avoid having any injection therapies in other practices.)   Very Rare: Severe mood changes; psychosis; aseptic necrosis.  From procedure: Some discomfort is to be expected once the numbing medicine wears off. This should be minimal if ice and heat are applied as instructed.  Call if: (When should I call?)  You experience numbness and weakness that gets worse with time, as opposed to wearing off.  New onset bowel or bladder incontinence. (Applies only to procedures done in the spine)  Emergency Numbers:  Durning business hours (Monday - Thursday, 8:00 AM - 4:00 PM) (Friday, 9:00 AM - 12:00 Noon): [phone]  After hours: [phone]  NOTE: If you are having a problem and are unable connect with, or to talk to a provider, then go to your nearest urgent care or emergency department. If the problem is serious and urgent, please call 911. ____________________________________________________________________________________________   Pain Management Discharge Instructions  General Discharge Instructions :  If you need to reach your doctor call: Monday-Friday 8:00 am - 4:00 pm at [phone] or toll free [phone].  After clinic hours [phone] to have operator reach doctor.  Bring all of your medication bottles to all your appointments in the pain clinic.  To cancel or reschedule your appointment with Pain Management please remember to call 24 hours in advance to avoid a fee.  Refer to the educational materials which you have been given on: General Risks, I had my  Procedure. Discharge Instructions, Post Sedation.  Post Procedure Instructions:  The drugs you were given will stay in your system until tomorrow, so for the next 24 hours you should not drive, make any legal decisions or drink any alcoholic beverages.  You may eat anything you prefer, but it is better to start with liquids then soups and crackers, and gradually work up to solid foods.  Please notify your doctor immediately if you have any unusual bleeding, trouble breathing or pain that is not related to your normal pain.  Depending on the type of procedure that was done, some parts of your body may feel week and/or numb.  This usually clears up by tonight or the next day.  Walk with the use of an assistive device or accompanied by an adult for the 24 hours.  You may use ice on the affected area for the first 24 hours.  Put ice in a Ziploc bag and cover with a towel and place against area 15 minutes on 15 minutes off.  You may switch to heat after 24 hours.Occipital Nerve Block Patient Information  Description: The occipital nerves originate in the cervical (neck) spinal cord and travel upward through muscle and tissue to supply sensation to the back of the head and top of the scalp.  In addition, the nerves control some of the muscles of the scalp.  Occipital neuralgia  is an irritation of these nerves which can cause headaches, numbness of the scalp, and neck discomfort.     The occipital nerve block will interrupt nerve transmission through these nerves and can relieve pain and spasm.  The block consists of insertion of a small needle under the skin in the back of the head to deposit local anesthetic (numbing medicine) and/or steroids around the nerve.  The entire block usually lasts less than 5 minutes.  Conditions which may be treated by occipital blocks:   Muscular pain and spasm of the scalp  Nerve irritation, back of the head  Headaches  Upper neck pain  Preparation for the  injection:  1. Do not eat any solid food or dairy products within 8 hours of your appointment. 2. You may drink clear liquids up to 3 hours before appointment.  Clear liquids include water, black coffee, juice or soda.  No milk or cream please. 3. You may take your regular medication, including pain medications, with a sip of water before you appointment.  Diabetics should hold regular insulin (if taken separately) and take 1/2 normal NPH dose the morning of the procedure.  Carry some sugar containing items with you to your appointment. 4. A driver must accompany you and be prepared to drive you home after your procedure. 5. Bring all your current medications with you. 6. An IV may be inserted and sedation may be given at the discretion of the physician. 7. A blood pressure cuff, EKG, and other monitors will often be applied during the procedure.  Some patients may need to have extra oxygen administered for a short period. 8. You will be asked to provide medical information, including your allergies and medications, prior to the procedure.  We must know immediately if you are taking blood thinners (like Coumadin/Warfarin) or if you are allergic to IV iodine contrast (dye).  We must know if you could possible be pregnant.  9. Do not wear a high collared shirt or turtleneck.  Tie long hair up in the back if possible.  Possible side-effects:   Bleeding from needle site  Infection (rare, may require surgery)  Nerve injury (rare)  Hair on back of neck can be tinged with iodine scrub (this will wash out)  Light-headedness (temporary)  Pain at injection site (several days)  Decreased blood pressure (rare, temporary)  Seizure (very rare)  Call if you experience:   Hives or difficulty breathing ( go to the emergency room)  Inflammation or drainage at the injection site(s)  Please note:  Although the local anesthetic injected can often make your painful muscles or headache feel good for  several hours after the injection, the pain may return.  It takes 3-7 days for steroids to work.  You may not notice any pain relief for at least one week.  If effective, we will often do a series of injections spaced 3-6 weeks apart to maximally decrease your pain.  If you have any questions, please call [phone] [facility]

## 2019-07-20 ENCOUNTER — Telehealth: Payer: Self-pay | Admitting: *Deleted

## 2019-07-20 NOTE — Telephone Encounter (Signed)
"  Doing great this AM". No complaints or issues overnight.

## 2019-08-17 ENCOUNTER — Ambulatory Visit: Payer: Medicare Other | Admitting: Pain Medicine

## 2019-08-18 ENCOUNTER — Encounter: Payer: Self-pay | Admitting: Pain Medicine

## 2019-08-21 NOTE — Progress Notes (Signed)
Pain Management Virtual Encounter Note - Virtual Visit via Telephone Telehealth (real-time audio visits between healthcare provider and patient).   Patient's Phone No. & Preferred Pharmacy:  [phone] (home); [phone] (mobile); (Preferred) [phone] [id number] [place], [place] - [address] AT [facility] [address] [place] [address] Phone: [phone] Fax: [phone]    Pre-screening note:  Our staff contacted Amber Acosta and offered her an "in person", "face-to-face" appointment versus a telephone encounter. She indicated preferring the telephone encounter, at this time.   Reason for Virtual Visit: COVID-19*  Social distancing based on CDC and AMA recommendations.   I contacted Lezette Lobdell on 08/22/2019 via telephone.      I clearly identified myself as Oswaldo Done, MD. I verified that I was speaking with the correct person using two identifiers (Name: [name], and date of birth: 10-14-1951).  Advanced Informed Consent I sought verbal advanced consent from Amado Nash for virtual visit interactions. I informed Amber Acosta of possible security and privacy concerns, risks, and limitations associated with providing "not-in-person" medical evaluation and management services. I also informed [name] of the availability of "in-person" appointments. Finally, I informed her that there would be a charge for the virtual visit and that she could be  personally, fully or partially, financially responsible for it. Amber Acosta expressed understanding and agreed to proceed.   Historic Elements   [name] is a 68 y.o. year old, female patient evaluated today after her last encounter by our practice on 07/20/2019. Amber Acosta  has a past medical history of Arthritis, Cervical spondylosis without myelopathy (09/21/2015), Chronic pain, Chronic pain associated with significant psychosocial dysfunction (08/28/2013),  Depression, Diabetes mellitus without complication (HCC), Displacement of cervical intervertebral disc without myelopathy (09/05/2015), Fibromyalgia, GERD (gastroesophageal reflux disease), Hypertension, Kidney disease, chronic, stage III (GFR 30-59 ml/min) (HCC), Sarcoid, and Thrombopenia (HCC). She also  has a past surgical history that includes neck fusion; Breast surgery; Back surgery; Knee arthroscopy; Fracture surgery; Hand surgery; Abdominal hysterectomy; and Cyst excision. Amber Acosta has a current medication list which includes the following prescription(s): advair diskus, albuterol, amlodipine, b-d ins syr ultrafine 1cc/30g, b-d ins syr ultrafine 1cc/31g, vitamin d3, cyanocobalamin, doxycycline, fluoxetine, furosemide, gabapentin, insulin regular, klor-con 10, losartan, lovastatin, metoprolol succinate, nitroglycerin, omeprazole, oxycodone, oxycodone, oxycodone, tizanidine, trazodone, hydralazine, and insulin nph human. She  reports that she has quit smoking. She has quit using smokeless tobacco. She reports that she does not drink alcohol or use drugs. Amber Acosta is allergic to aspirin; ciprofloxacin; etanercept; metformin; methotrexate; mushroom extract complex; other; darvon [propoxyphene]; minocycline; and cobalamin combinations.   HPI  Today, she is being contacted for medication management.  The patient indicates doing well with the current medication regimen. No adverse reactions or side effects reported to the medications.   Pharmacotherapy Assessment  Analgesic: Oxycodone IR 5 mg, 1 tab PO q 8 hrs (15 mg/day of oxycodone) MME/day:22.5 mg/day.   Monitoring: Pharmacotherapy: No side-effects or adverse reactions reported. [place] PMP: PDMP reviewed during this encounter.       Compliance: No problems identified. Effectiveness: Clinically acceptable. Plan: Refer to "POC".  UDS:  Summary  Date Value Ref Range Status  02/08/2019 FINAL  Final    Comment:     ==================================================================== TOXASSURE SELECT 13 (MW) ==================================================================== Test  Result       Flag       Units Drug Present and Declared for Prescription Verification   Oxycodone                      1510         EXPECTED   ng/mg creat   Noroxycodone                   1960         EXPECTED   ng/mg creat    Sources of oxycodone include scheduled prescription medications.    Noroxycodone is an expected metabolite of oxycodone. ==================================================================== Test                      Result    Flag   Units      Ref Range   Creatinine              82               mg/dL      >=40>=20 ==================================================================== Declared Medications:  The flagging and interpretation on this report are based on the  following declared medications.  Unexpected results may arise from  inaccuracies in the declared medications.  **Note: The testing scope of this panel includes these medications:  Oxycodone  **Note: The testing scope of this panel does not include following  reported medications:  Albuterol  Amlodipine Besylate  Fluoxetine  Fluticasone  Furosemide  Gabapentin  Hydralazine  Insulin  Insulin (Humulin)  Losartan (Losartan Potassium)  Lovastatin  Metoprolol  Nitroglycerin  Omeprazole  Potassium  Ranitidine  Salmeterol  Trazodone ==================================================================== For clinical consultation, please call [phone]. ====================================================================    Laboratory Chemistry Profile (12 mo)  Renal: No results found for requested labs within last 8760 hours.  Lab Results  Component Value Date   GFRAA 50 (L) 03/26/2016   GFRNONAA 44 (L) 03/26/2016   Hepatic: No results found for requested labs within last 8760 hours. Lab Results   Component Value Date   AST 24 03/26/2016   ALT 20 03/26/2016   Other: No results found for requested labs within last 8760 hours. Note: Above Lab results reviewed.  Imaging  Last 90 days:  [facility] Clinic C-arm 1-60 Min No Report  Result Date: 07/19/2019 Fluoro was used, but no Radiologist interpretation will be provided. Please refer to "NOTES" tab for provider progress note.   Assessment  The primary encounter diagnosis was Chronic pain syndrome. Diagnoses of Chronic low back pain (Primary Area of Pain) (Bilateral) (R>L), Chronic neck pain (Secondary area of Pain) (Bilateral) (L>R), Chronic shoulder pain (Third area of Pain) (Bilateral) (L>R), Myofascial pain, and Neurogenic pain were also pertinent to this visit.  Plan of Care  I have discontinued Lariya Messing's oxyCODONE and oxyCODONE. I have also changed her gabapentin and oxyCODONE. Additionally, I am having her start on oxyCODONE and oxyCODONE. Lastly, I am having her maintain her lovastatin, traZODone, B-D INS SYR ULTRAFINE 1CC/30G, losartan, nitroGLYCERIN, FLUoxetine, amLODipine, albuterol, metoprolol succinate, Klor-Con 10, hydrALAZINE, furosemide, B-D INS SYR ULTRAFINE 1CC/31G, insulin NPH Human, Advair Diskus, omeprazole, doxycycline, insulin regular, Vitamin D3, Cyanocobalamin (VITAMIN B 12 PO), and tiZANidine.  Pharmacotherapy (Medications Ordered): Meds ordered this encounter  Medications  . tiZANidine (ZANAFLEX) 4 MG tablet    Sig: Take 1 tablet (4 mg total) by mouth 2 (two) times daily as needed for muscle spasms.    Dispense:  60  tablet    Refill:  2    Fill one day early if pharmacy is closed on scheduled refill date. May substitute for generic if available.  . gabapentin (NEURONTIN) 400 MG capsule    Sig: Take 1 capsule (400 mg total) by mouth at bedtime.    Dispense:  30 capsule    Refill:  2    Fill one day early if pharmacy is closed on scheduled refill date. May substitute for generic if available.  Marland Kitchen  oxyCODONE (OXY IR/ROXICODONE) 5 MG immediate release tablet    Sig: Take 1 tablet (5 mg total) by mouth every 8 (eight) hours as needed for severe pain. Must last 30 days    Dispense:  90 tablet    Refill:  0    Chronic Pain: STOP Act (Not applicable) Fill 1 day early if closed on refill date. Do not fill until: 08/31/2019. To last until: 09/30/2019. Avoid benzodiazepines within 8 hours of opioids  . oxyCODONE (OXY IR/ROXICODONE) 5 MG immediate release tablet    Sig: Take 1 tablet (5 mg total) by mouth every 8 (eight) hours as needed for severe pain. Must last 30 days    Dispense:  90 tablet    Refill:  0    Chronic Pain: STOP Act (Not applicable) Fill 1 day early if closed on refill date. Do not fill until: 09/30/2019. To last until: 10/30/2019. Avoid benzodiazepines within 8 hours of opioids  . oxyCODONE (OXY IR/ROXICODONE) 5 MG immediate release tablet    Sig: Take 1 tablet (5 mg total) by mouth every 8 (eight) hours as needed for severe pain. Must last 30 days    Dispense:  90 tablet    Refill:  0    Chronic Pain: STOP Act (Not applicable) Fill 1 day early if closed on refill date. Do not fill until: 10/30/2019. To last until: 11/29/2019. Avoid benzodiazepines within 8 hours of opioids   Orders:  No orders of the defined types were placed in this encounter.  Follow-up plan:   Return in about 14 weeks (around 11/28/2019) for (VV), (MM).      Interventional management options:  Considering: NOTE: NO RFA(Poor candidate due to delayed sensory perceptionand intra-procedural non-compliance.) No additional procedures planned at this time.   Prior & Palliative treatment(s): Palliative bilateral lumbar facet block #3(NO RFA)(for low back pain) Palliative left CESI #3  Palliative right CESI #2  Palliative left-sided C2 + TON #2  Palliativeleft cervical facet block(NO RFA)(for left-sided neck pain)  Palliative left cervical facet RFA #3 (last done 05/04/2018) (not to be repeated  due to delayed sensory perceptionand intra-procedural non-compliance.)(Poor candidate) Palliative left GONB #6(NORFA) (for left-sided headaches)     Recent Visits Date Type Provider Dept  07/19/19 Procedure visit Milinda Pointer, MD [facility]  05/25/19 Office Visit Milinda Pointer, MD [facility]  Showing recent visits within past 90 days and meeting all other requirements   Today's Visits Date Type Provider Dept  08/22/19 Office Visit Milinda Pointer, MD [facility]  Showing today's visits and meeting all other requirements   Future Appointments No visits were found meeting these conditions.  Showing future appointments within next 90 days and meeting all other requirements   I discussed the assessment and treatment plan with the patient. The patient was provided an opportunity to ask questions and all were answered. The patient agreed with the plan and demonstrated an understanding of the instructions.  Patient advised to call back or seek an  in-person evaluation if the symptoms or condition worsens.  Total duration of non-face-to-face encounter: 12 minutes.  Note by: Oswaldo DoneFrancisco A Genifer Lazenby, MD Date: 08/22/2019; Time: 9:56 AM  Note: This dictation was prepared with Dragon dictation. Any transcriptional errors that may result from this process are unintentional.  Disclaimer:  * Given the special circumstances of the COVID-19 pandemic, the federal government has announced that the Office for Civil Rights (OCR) will exercise its enforcement discretion and will not impose penalties on physicians using telehealth in the event of noncompliance with regulatory requirements under the [employer] Insurance Portability and Accountability Act (HIPAA) in connection with the good faith provision of telehealth during the COVID-19 national public health emergency. (AMA)

## 2019-08-22 ENCOUNTER — Other Ambulatory Visit: Payer: Self-pay

## 2019-08-22 ENCOUNTER — Ambulatory Visit: Payer: Medicare Other | Attending: Pain Medicine | Admitting: Pain Medicine

## 2019-08-22 DIAGNOSIS — M7918 Myalgia, other site: Secondary | ICD-10-CM

## 2019-08-22 DIAGNOSIS — M549 Dorsalgia, unspecified: Secondary | ICD-10-CM

## 2019-08-22 DIAGNOSIS — M542 Cervicalgia: Secondary | ICD-10-CM

## 2019-08-22 DIAGNOSIS — M545 Low back pain: Secondary | ICD-10-CM

## 2019-08-22 DIAGNOSIS — G8929 Other chronic pain: Secondary | ICD-10-CM

## 2019-08-22 DIAGNOSIS — M792 Neuralgia and neuritis, unspecified: Secondary | ICD-10-CM

## 2019-08-22 DIAGNOSIS — G894 Chronic pain syndrome: Secondary | ICD-10-CM

## 2019-08-22 DIAGNOSIS — M25511 Pain in right shoulder: Secondary | ICD-10-CM | POA: Diagnosis not present

## 2019-08-22 DIAGNOSIS — M25512 Pain in left shoulder: Secondary | ICD-10-CM

## 2019-08-22 MED ORDER — GABAPENTIN 400 MG PO CAPS: 400.0000 mg | ORAL_CAPSULE | Freq: Every day | ORAL | 2 refills | Status: DC

## 2019-08-22 MED ORDER — TIZANIDINE HCL 4 MG PO TABS: 4.0000 mg | ORAL_TABLET | Freq: Two times a day (BID) | ORAL | 2 refills | Status: DC | PRN

## 2019-08-22 MED ORDER — OXYCODONE HCL 5 MG PO TABS: 5.0000 mg | ORAL_TABLET | Freq: Three times a day (TID) | ORAL | 0 refills | Status: DC | PRN

## 2019-08-22 NOTE — Patient Instructions (Signed)
____________________________________________________________________________________________  Medication Rules  Purpose: To inform patients, and their family members, of our rules and regulations.  Applies to: All patients receiving prescriptions (written or electronic).  Pharmacy of record: Pharmacy where electronic prescriptions will be sent. If written prescriptions are taken to a different pharmacy, please inform the nursing staff. The pharmacy listed in the electronic medical record should be the one where you would like electronic prescriptions to be sent.  Electronic prescriptions: In compliance with the [place] Strengthen Opioid Misuse Prevention (STOP) Act of 2017 (Session Law 2017-74/H243), effective December 01, 2018, all controlled substances must be electronically prescribed. Calling prescriptions to the pharmacy will cease to exist.  Prescription refills: Only during scheduled appointments. Applies to all prescriptions.  NOTE: The following applies primarily to controlled substances (Opioid* Pain Medications).   Patient's responsibilities: 1. Pain Pills: Bring all pain pills to every appointment (except for procedure appointments). 2. Pill Bottles: Bring pills in original pharmacy bottle. Always bring the newest bottle. Bring bottle, even if empty. 3. Medication refills: You are responsible for knowing and keeping track of what medications you take and those you need refilled. The day before your appointment: write a list of all prescriptions that need to be refilled. The day of the appointment: give the list to the admitting nurse. Prescriptions will be written only during appointments. No prescriptions will be written on procedure days. If you forget a medication: it will not be "Called in", "Faxed", or "electronically sent". You will need to get another appointment to get these prescribed. No early refills. Do not call asking to have your prescription filled  early. 4. Prescription Accuracy: You are responsible for carefully inspecting your prescriptions before leaving our office. Have the discharge nurse carefully go over each prescription with you, before taking them home. Make sure that your name is accurately spelled, that your address is correct. Check the name and dose of your medication to make sure it is accurate. Check the number of pills, and the written instructions to make sure they are clear and accurate. Make sure that you are given enough medication to last until your next medication refill appointment. 5. Taking Medication: Take medication as prescribed. When it comes to controlled substances, taking less pills or less frequently than prescribed is permitted and encouraged. Never take more pills than instructed. Never take medication more frequently than prescribed.  6. Inform other Doctors: Always inform, all of your healthcare providers, of all the medications you take. 7. Pain Medication from other Providers: You are not allowed to accept any additional pain medication from any other Doctor or Healthcare provider. There are two exceptions to this rule. (see below) In the event that you require additional pain medication, you are responsible for notifying us, as stated below. 8. Medication Agreement: You are responsible for carefully reading and following our Medication Agreement. This must be signed before receiving any prescriptions from our practice. Safely store a copy of your signed Agreement. Violations to the Agreement will result in no further prescriptions. (Additional copies of our Medication Agreement are available upon request.) 9. Laws, Rules, & Regulations: All patients are expected to follow all Federal and State Laws, Statutes, Rules, & Regulations. Ignorance of the Laws does not constitute a valid excuse. The use of any illegal substances is prohibited. 10. Adopted CDC guidelines & recommendations: Target dosing levels will be  at or below 60 MME/day. Use of benzodiazepines** is not recommended.  Exceptions: There are only two exceptions to the rule of not   receiving pain medications from other Healthcare Providers. 1. Exception #1 (Emergencies): In the event of an emergency (i.e.: accident requiring emergency care), you are allowed to receive additional pain medication. However, you are responsible for: As soon as you are able, call our office [phone], at any time of the day or night, and leave a message stating your name, the date and nature of the emergency, and the name and dose of the medication prescribed. In the event that your call is answered by a member of our staff, make sure to document and save the date, time, and the name of the person that took your information.  2. Exception #2 (Planned Surgery): In the event that you are scheduled by another doctor or dentist to have any type of surgery or procedure, you are allowed (for a period no longer than 30 days), to receive additional pain medication, for the acute post-op pain. However, in this case, you are responsible for picking up a copy of our "Post-op Pain Management for Surgeons" handout, and giving it to your surgeon or dentist. This document is available at our office, and does not require an appointment to obtain it. Simply go to our office during business hours (Monday-Thursday from 8:00 AM to 4:00 PM) (Friday 8:00 AM to 12:00 Noon) or if you have a scheduled appointment with us, prior to your surgery, and ask for it by name. In addition, you will need to provide us with your name, name of your surgeon, type of surgery, and date of procedure or surgery.  *Opioid medications include: morphine, codeine, oxycodone, oxymorphone, hydrocodone, hydromorphone, meperidine, tramadol, tapentadol, buprenorphine, fentanyl, methadone. **Benzodiazepine medications include: diazepam (Valium), alprazolam (Xanax), clonazepam (Klonopine), lorazepam (Ativan), clorazepate  (Tranxene), chlordiazepoxide (Librium), estazolam (Prosom), oxazepam (Serax), temazepam (Restoril), triazolam (Halcion) (Last updated: 01/28/2018) ____________________________________________________________________________________________   ____________________________________________________________________________________________  Medication Recommendations and Reminders  Applies to: All patients receiving prescriptions (written and/or electronic).  Medication Rules & Regulations: These rules and regulations exist for your safety and that of others. They are not flexible and neither are we. Dismissing or ignoring them will be considered "non-compliance" with medication therapy, resulting in complete and irreversible termination of such therapy. (See document titled "Medication Rules" for more details.) In all conscience, because of safety reasons, we cannot continue providing a therapy where the patient does not follow instructions.  Pharmacy of record:   Definition: This is the pharmacy where your electronic prescriptions will be sent.   We do not endorse any particular pharmacy.  You are not restricted in your choice of pharmacy.  The pharmacy listed in the electronic medical record should be the one where you want electronic prescriptions to be sent.  If you choose to change pharmacy, simply notify our nursing staff of your choice of new pharmacy.  Recommendations:  Keep all of your pain medications in a safe place, under lock and key, even if you live alone.   After you fill your prescription, take 1 week's worth of pills and put them away in a safe place. You should keep a separate, properly labeled bottle for this purpose. The remainder should be kept in the original bottle. Use this as your primary supply, until it runs out. Once it's gone, then you know that you have 1 week's worth of medicine, and it is time to come in for a prescription refill. If you do this correctly, it  is unlikely that you will ever run out of medicine.  To make sure that the above recommendation works,   it is very important that you make sure your medication refill appointments are scheduled at least 1 week before you run out of medicine. To do this in an effective manner, make sure that you do not leave the office without scheduling your next medication management appointment. Always ask the nursing staff to show you in your prescription , when your medication will be running out. Then arrange for the receptionist to get you a return appointment, at least 7 days before you run out of medicine. Do not wait until you have 1 or 2 pills left, to come in. This is very poor planning and does not take into consideration that we may need to cancel appointments due to bad weather, sickness, or emergencies affecting our staff.  "Partial Fill": If for any reason your pharmacy does not have enough pills/tablets to completely fill or refill your prescription, do not allow for a "partial fill". You will need a separate prescription to fill the remaining amount, which we will not provide. If the reason for the partial fill is your insurance, you will need to talk to the pharmacist about payment alternatives for the remaining tablets, but again, do not accept a partial fill.  Prescription refills and/or changes in medication(s):   Prescription refills, and/or changes in dose or medication, will be conducted only during scheduled medication management appointments. (Applies to both, written and electronic prescriptions.)  No refills on procedure days. No medication will be changed or started on procedure days. No changes, adjustments, and/or refills will be conducted on a procedure day. Doing so will interfere with the diagnostic portion of the procedure.  No phone refills. No medications will be "called into the pharmacy".  No Fax refills.  No weekend refills.  No Holliday refills.  No after hours  refills.  Remember:  Business hours are:  Monday to Thursday 8:00 AM to 4:00 PM Provider's Schedule: Senora Lacson, MD - Appointments are:  Medication management: Monday and Wednesday 8:00 AM to 4:00 PM Procedure day: Tuesday and Thursday 7:30 AM to 4:00 PM Bilal Lateef, MD - Appointments are:  Medication management: Tuesday and Thursday 8:00 AM to 4:00 PM Procedure day: Monday and Wednesday 7:30 AM to 4:00 PM (Last update: 01/28/2018) ____________________________________________________________________________________________    

## 2019-11-22 ENCOUNTER — Encounter: Payer: Self-pay | Admitting: Pain Medicine

## 2019-11-27 NOTE — Progress Notes (Signed)
Virtual Encounter - Pain Management PROVIDER NOTE: Information contained herein reflects review and annotations entered in association with encounter. Interpretation of such information and data should be left to medically-trained personnel. Information provided to patient can be located elsewhere in the medical record under "Patient Instructions". Document created using STT-dictation technology, any transcriptional errors that may result from process are unintentional.    Contact & Pharmacy Preferred: [phone] Home: [phone] (home) Mobile: [phone] (mobile) E-mail: [facility] [phone] [place], [place] - [address] AT [facility] OF [address] [place] [address] [place] [address] Phone: [phone] Fax: [phone]   Pre-screening  Ms. Steffek offered "in-person" vs "virtual" encounter. She indicated preferring virtual for this encounter.   Reason COVID-19*  Social distancing based on CDC and AMA recommendations.   I contacted Laticha Nakagawa on 11/28/2019 via telephone.      I clearly identified myself as Oswaldo Done, MD. I verified that I was speaking with the correct person using two identifiers (Name: Chene Kasinger, and date of birth: Jan 04, 1951).  Consent I sought verbal advanced consent from Amado Nash for virtual visit interactions. I informed Ms. Bermingham of possible security and privacy concerns, risks, and limitations associated with providing "not-in-person" medical evaluation and management services. I also informed Ms. Reep of the availability of "in-person" appointments. Finally, I informed her that there would be a charge for the virtual visit and that she could be  personally, fully or partially, financially responsible for it. Ms. Kinkaid expressed understanding and agreed to proceed.   Historic Elements   Ms. Joua Bake is a 68 y.o. year old, female patient evaluated today after her last encounter by our practice on 08/22/2019.  Ms. Guastella  has a past medical history of Arthritis, Cervical spondylosis without myelopathy (09/21/2015), Chronic pain, Chronic pain associated with significant psychosocial dysfunction (08/28/2013), Depression, Diabetes mellitus without complication (HCC), Displacement of cervical intervertebral disc without myelopathy (09/05/2015), Fibromyalgia, GERD (gastroesophageal reflux disease), Hypertension, Kidney disease, chronic, stage III (GFR 30-59 ml/min), Sarcoid, and Thrombopenia (HCC). She also  has a past surgical history that includes neck fusion; Breast surgery; Back surgery; Knee arthroscopy; Fracture surgery; Hand surgery; Abdominal hysterectomy; and Cyst excision. Ms. Soulier has a current medication list which includes the following prescription(s): advair diskus, albuterol, amlodipine, b-d ins syr ultrafine 1cc/30g, b-d ins syr ultrafine 1cc/31g, vitamin d3, cyanocobalamin, doxycycline, fluoxetine, furosemide, [START ON 11/29/2019] gabapentin, insulin regular, klor-con 10, liraglutide, losartan, lovastatin, metoprolol succinate, nitroglycerin, omeprazole, [START ON 11/29/2019] oxycodone, [START ON 12/29/2019] oxycodone, [START ON 01/28/2020] oxycodone, [START ON 11/29/2019] tizanidine, trazodone, hydralazine, and insulin nph human. She  reports that she has quit smoking. She has quit using smokeless tobacco. She reports that she does not drink alcohol or use drugs. Ms. Talkington is allergic to aspirin; ciprofloxacin; etanercept; metformin; methotrexate; mushroom extract complex; other; darvon [propoxyphene]; minocycline; and cobalamin combinations.   HPI  Today, she is being contacted for medication management. The patient indicates doing well with the current medication regimen. No adverse reactions or side effects reported to the medications.   Pharmacotherapy Assessment  Analgesic: Oxycodone IR 5 mg, 1 tab PO q 8 hrs (15 mg/day of oxycodone) MME/day:22.5 mg/day.   Monitoring: Pharmacotherapy: No  side-effects or adverse reactions reported. [place] PMP: PDMP reviewed during this encounter.       Compliance: No problems identified. Effectiveness: Clinically acceptable. Plan: Refer to "POC".  UDS:  Summary  Date Value Ref Range Status  02/08/2019 FINAL  Final  Comment:    ==================================================================== TOXASSURE SELECT 13 (MW) ==================================================================== Test                             Result       Flag       Units Drug Present and Declared for Prescription Verification   Oxycodone                      1510         EXPECTED   ng/mg creat   Noroxycodone                   1962         EXPECTED   ng/mg creat    Sources of oxycodone include scheduled prescription medications.    Noroxycodone is an expected metabolite of oxycodone. ==================================================================== Test                      Result    Flag   Units      Ref Range   Creatinine              82               mg/dL      >=57>=20 ==================================================================== Declared Medications:  The flagging and interpretation on this report are based on the  following declared medications.  Unexpected results may arise from  inaccuracies in the declared medications.  **Note: The testing scope of this panel includes these medications:  Oxycodone  **Note: The testing scope of this panel does not include following  reported medications:  Albuterol  Amlodipine Besylate  Fluoxetine  Fluticasone  Furosemide  Gabapentin  Hydralazine  Insulin  Insulin (Humulin)  Losartan (Losartan Potassium)  Lovastatin  Metoprolol  Nitroglycerin  Omeprazole  Potassium  Ranitidine  Salmeterol  Trazodone ==================================================================== For clinical consultation, please call [phone]. ====================================================================     Laboratory Chemistry Profile (12 mo)  Renal: No results found for requested labs within last 8760 hours.  Lab Results  Component Value Date   GFRAA 50 (L) 03/26/2016   GFRNONAA 44 (L) 03/26/2016   Hepatic: No results found for requested labs within last 8760 hours. Lab Results  Component Value Date   AST 24 03/26/2016   ALT 20 03/26/2016   Other: No results found for requested labs within last 8760 hours. Note: Above Lab results reviewed.  Imaging  DG PAIN CLINIC C-ARM 1-60 MIN NO REPORT Fluoro was used, but no Radiologist interpretation will be provided.  Please refer to "NOTES" tab for provider progress note.   Assessment  Diagnoses of Chronic pain syndrome, Myofascial pain, Chronic low back pain (Primary Area of Pain) (Bilateral) (R>L), and Neurogenic pain were pertinent to this visit.  Plan of Care  Problem-specific:  No problem-specific Assessment & Plan notes found for this encounter.  I am having Shaelynn Haertel start on oxyCODONE and oxyCODONE. I am also having her maintain her lovastatin, traZODone, B-D INS SYR ULTRAFINE 1CC/30G, losartan, nitroGLYCERIN, FLUoxetine, amLODipine, albuterol, metoprolol succinate, Klor-Con 10, hydrALAZINE, furosemide, B-D INS SYR ULTRAFINE 1CC/31G, insulin NPH Human, Advair Diskus, omeprazole, doxycycline, insulin regular, Vitamin D3, Cyanocobalamin (VITAMIN B 12 PO), liraglutide, oxyCODONE, tiZANidine, and gabapentin.  Pharmacotherapy (Medications Ordered): Meds ordered this encounter  Medications  . oxyCODONE (OXY IR/ROXICODONE) 5 MG immediate release tablet    Sig: Take 1 tablet (5 mg total) by mouth every 8 (eight) hours  as needed for severe pain. Must last 30 days    Dispense:  90 tablet    Refill:  0    Chronic Pain: STOP Act (Not applicable) Fill 1 day early if closed on refill date. Do not fill until: 11/29/2019. To last until: 12/29/2019. Avoid benzodiazepines within 8 hours of opioids  . oxyCODONE (OXY IR/ROXICODONE) 5 MG  immediate release tablet    Sig: Take 1 tablet (5 mg total) by mouth every 8 (eight) hours as needed for severe pain. Must last 30 days    Dispense:  90 tablet    Refill:  0    Chronic Pain: STOP Act (Not applicable) Fill 1 day early if closed on refill date. Do not fill until: 12/29/2019. To last until: 01/28/2020. Avoid benzodiazepines within 8 hours of opioids  . oxyCODONE (OXY IR/ROXICODONE) 5 MG immediate release tablet    Sig: Take 1 tablet (5 mg total) by mouth every 8 (eight) hours as needed for severe pain. Must last 30 days    Dispense:  90 tablet    Refill:  0    Chronic Pain: STOP Act (Not applicable) Fill 1 day early if closed on refill date. Do not fill until: 01/28/2020. To last until: 02/27/2020. Avoid benzodiazepines within 8 hours of opioids  . tiZANidine (ZANAFLEX) 4 MG tablet    Sig: Take 1 tablet (4 mg total) by mouth 2 (two) times daily as needed for muscle spasms.    Dispense:  60 tablet    Refill:  2    Fill one day early if pharmacy is closed on scheduled refill date. May substitute for generic if available.  . gabapentin (NEURONTIN) 400 MG capsule    Sig: Take 1 capsule (400 mg total) by mouth at bedtime.    Dispense:  30 capsule    Refill:  5    Fill one day early if pharmacy is closed on scheduled refill date. May substitute for generic if available.   Orders:  No orders of the defined types were placed in this encounter.  Follow-up plan:   No follow-ups on file.      Interventional management options:  Considering: NOTE: NO RFA(Poor candidate due to delayed sensory perceptionand intra-procedural non-compliance.) No additional procedures planned at this time.   Prior & Palliative treatment(s): Palliative bilateral lumbar facet block #3(NO RFA)(for low back pain) Palliative left CESI #3  Palliative right CESI #2  Palliative left-sided C2 + TON #2  Palliativeleft cervical facet block(NO RFA)(for left-sided neck pain)  Palliative left cervical  facet RFA #3 (last done 05/04/2018) (not to be repeated due to delayed sensory perceptionand intra-procedural non-compliance.)(Poor candidate) Palliative left GONB #6(NORFA) (for left-sided headaches)      Recent Visits No visits were found meeting these conditions.  Showing recent visits within past 90 days and meeting all other requirements   Today's Visits Date Type Provider Dept  11/28/19 Telemedicine Delano Metz, MD [facility]  Showing today's visits and meeting all other requirements   Future Appointments No visits were found meeting these conditions.  Showing future appointments within next 90 days and meeting all other requirements   I discussed the assessment and treatment plan with the patient. The patient was provided an opportunity to ask questions and all were answered. The patient agreed with the plan and demonstrated an understanding of the instructions.  Patient advised to call back or seek an in-person evaluation if the symptoms or condition worsens.  Total duration of non-face-to-face encounter: 13  minutes.  Note by: Gaspar Cola, MD Date: 11/28/2019; Time: 8:28 AM

## 2019-11-28 ENCOUNTER — Other Ambulatory Visit: Payer: Self-pay

## 2019-11-28 ENCOUNTER — Ambulatory Visit: Payer: Medicare Other | Attending: Pain Medicine | Admitting: Pain Medicine

## 2019-11-28 DIAGNOSIS — M7918 Myalgia, other site: Secondary | ICD-10-CM

## 2019-11-28 DIAGNOSIS — M545 Low back pain, unspecified: Secondary | ICD-10-CM

## 2019-11-28 DIAGNOSIS — M792 Neuralgia and neuritis, unspecified: Secondary | ICD-10-CM

## 2019-11-28 DIAGNOSIS — G8929 Other chronic pain: Secondary | ICD-10-CM

## 2019-11-28 DIAGNOSIS — G894 Chronic pain syndrome: Secondary | ICD-10-CM | POA: Diagnosis not present

## 2019-11-28 MED ORDER — OXYCODONE HCL 5 MG PO TABS: 5.0000 mg | ORAL_TABLET | Freq: Three times a day (TID) | ORAL | 0 refills | Status: DC | PRN

## 2019-11-28 MED ORDER — TIZANIDINE HCL 4 MG PO TABS: 4.0000 mg | ORAL_TABLET | Freq: Two times a day (BID) | ORAL | 2 refills | Status: DC | PRN

## 2019-11-28 MED ORDER — GABAPENTIN 400 MG PO CAPS: 400.0000 mg | ORAL_CAPSULE | Freq: Every day | ORAL | 5 refills | Status: DC

## 2020-01-02 ENCOUNTER — Telehealth: Payer: Self-pay | Admitting: Pain Medicine

## 2020-01-02 NOTE — Telephone Encounter (Signed)
Patient lvmail on Fri 12-30-19 11:38 stating she is having pain going from neck up into her head and she would like to have injections for this. Need to know what injections to schedule and instructions

## 2020-01-02 NOTE — Telephone Encounter (Signed)
Called patient, she is having pain in occipital and would like injection. She has an order.  Pre op instructions given with understanding. Please sch for Greater occiptal

## 2020-01-10 ENCOUNTER — Encounter: Payer: Self-pay | Admitting: Pain Medicine

## 2020-01-10 ENCOUNTER — Other Ambulatory Visit: Payer: Self-pay

## 2020-01-10 ENCOUNTER — Ambulatory Visit (HOSPITAL_BASED_OUTPATIENT_CLINIC_OR_DEPARTMENT_OTHER): Payer: Medicare Other | Admitting: Pain Medicine

## 2020-01-10 ENCOUNTER — Ambulatory Visit
Admission: RE | Admit: 2020-01-10 | Discharge: 2020-01-10 | Disposition: A | Discharge status: Home / Self Care | Payer: Medicare Other | Source: Ambulatory Visit | Attending: Pain Medicine | Admitting: Pain Medicine

## 2020-01-10 VITALS — BP 99/68 | HR 73 | Temp 97.7°F | Resp 13 | Ht 63.0 in | Wt 225.0 lb

## 2020-01-10 DIAGNOSIS — R519 Headache, unspecified: Secondary | ICD-10-CM | POA: Insufficient documentation

## 2020-01-10 DIAGNOSIS — M542 Cervicalgia: Secondary | ICD-10-CM | POA: Diagnosis present

## 2020-01-10 DIAGNOSIS — M5481 Occipital neuralgia: Secondary | ICD-10-CM

## 2020-01-10 DIAGNOSIS — M4312 Spondylolisthesis, cervical region: Secondary | ICD-10-CM

## 2020-01-10 DIAGNOSIS — G4486 Cervicogenic headache: Secondary | ICD-10-CM

## 2020-01-10 DIAGNOSIS — R55 Syncope and collapse: Secondary | ICD-10-CM

## 2020-01-10 MED ORDER — ROPIVACAINE HCL 2 MG/ML IJ SOLN
5.0000 mL | Freq: Once | INTRAMUSCULAR | Status: AC
  Administered 2020-01-10: 5 mL

## 2020-01-10 MED ORDER — LACTATED RINGERS IV SOLN
1000.0000 mL | Freq: Once | INTRAVENOUS | Status: AC
  Administered 2020-01-10: 1000 mL via INTRAVENOUS

## 2020-01-10 MED ORDER — LIDOCAINE HCL 2 % IJ SOLN
INTRAMUSCULAR | Status: AC
  Filled 2020-01-10: qty 20

## 2020-01-10 MED ORDER — FENTANYL CITRATE (PF) 100 MCG/2ML IJ SOLN
25.0000 ug | INTRAMUSCULAR | Status: DC | PRN
  Administered 2020-01-10: 50 ug via INTRAVENOUS

## 2020-01-10 MED ORDER — GLYCOPYRROLATE 0.2 MG/ML IJ SOLN
INTRAMUSCULAR | Status: AC
  Filled 2020-01-10: qty 1

## 2020-01-10 MED ORDER — MIDAZOLAM HCL 5 MG/5ML IJ SOLN
1.0000 mg | INTRAMUSCULAR | Status: DC | PRN
  Administered 2020-01-10: 2 mg via INTRAVENOUS

## 2020-01-10 MED ORDER — LIDOCAINE HCL 2 % IJ SOLN
20.0000 mL | Freq: Once | INTRAMUSCULAR | Status: AC
  Administered 2020-01-10: 09:00:00 400 mg

## 2020-01-10 MED ORDER — FENTANYL CITRATE (PF) 100 MCG/2ML IJ SOLN
INTRAMUSCULAR | Status: AC
  Filled 2020-01-10: qty 2

## 2020-01-10 MED ORDER — MIDAZOLAM HCL 5 MG/5ML IJ SOLN
INTRAMUSCULAR | Status: AC
  Filled 2020-01-10: qty 5

## 2020-01-10 MED ORDER — DEXAMETHASONE SODIUM PHOSPHATE 10 MG/ML IJ SOLN
INTRAMUSCULAR | Status: AC
  Filled 2020-01-10: qty 1

## 2020-01-10 MED ORDER — DEXAMETHASONE SODIUM PHOSPHATE 10 MG/ML IJ SOLN
10.0000 mg | Freq: Once | INTRAMUSCULAR | Status: AC
  Administered 2020-01-10: 10 mg

## 2020-01-10 MED ORDER — GLYCOPYRROLATE 0.2 MG/ML IJ SOLN
0.2000 mg | Freq: Once | INTRAMUSCULAR | Status: AC
  Administered 2020-01-10: 0.2 mg via INTRAVENOUS

## 2020-01-10 MED ORDER — ROPIVACAINE HCL 2 MG/ML IJ SOLN
INTRAMUSCULAR | Status: AC
  Filled 2020-01-10: qty 10

## 2020-01-10 NOTE — Progress Notes (Signed)
PROVIDER NOTE: Information contained herein reflects review and annotations entered in association with encounter. Interpretation of such information and data should be left to medically-trained personnel. Information provided to patient can be located elsewhere in the medical record under "Patient Instructions". Document created using STT-dictation technology, any transcriptional errors that may result from process are unintentional.    Patient: Amber Acosta  Service Category: Procedure  Provider: Oswaldo Done, MD  DOB: 1951-08-09  DOS: 01/10/2020  Location: [facility]  MRN: [id number]  Setting: Ambulatory - outpatient  Referring Provider: Rayetta Humphrey, MD  Type: Established Patient  Specialty: Interventional Pain Management  PCP: Rayetta Humphrey, MD   Primary Reason for Visit: Interventional Pain Management Treatment. CC: Neck Pain (pt states she would like her back done as well due to fall) and Back Pain  Procedure:          Anesthesia, Analgesia, Anxiolysis:  Type: Palliative, Greater, Occipital Nerve Block           Region: Posterolateral Cervical Level: Occipital Ridge   Laterality: Left-Sided  Type: Moderate (Conscious) Sedation combined with Local Anesthesia Indication(s): Analgesia and Anxiety Route: Intravenous (IV) IV Access: Secured Sedation: Meaningful verbal contact was maintained at all times during the procedure  Local Anesthetic: Lidocaine 1-2%  Position: Prone   Indications: 1. Bilateral occipital neuralgia   2. Cervicogenic headache (Left)   3. Cervicalgia   4. Chronic Occipital neuralgia (Left)   5. Spondylolisthesis of cervical region    Pain Score: Pre-procedure: 7 /10 Post-procedure: 0-No pain/10   Pre-op Assessment:  Ms. Winterhalter is a 69 y.o. (year old), female patient, seen today for interventional treatment. She  has a past surgical history that includes neck fusion; Breast surgery; Back surgery; Knee arthroscopy; Fracture surgery;  Hand surgery; Abdominal hysterectomy; and Cyst excision. Ms. Seebeck has a current medication list which includes the following prescription(s): advair diskus, albuterol, amlodipine, b-d ins syr ultrafine 1cc/30g, b-d ins syr ultrafine 1cc/31g, vitamin d3, cyanocobalamin, doxycycline, fluoxetine, furosemide, gabapentin, insulin regular, klor-con 10, liraglutide, losartan, lovastatin, metoprolol succinate, nitroglycerin, omeprazole, oxycodone, [START ON 01/28/2020] oxycodone, tizanidine, trazodone, hydralazine, insulin nph human, and oxycodone, and the following Facility-Administered Medications: fentanyl and midazolam. Her primarily concern today is the Neck Pain (pt states she would like her back done as well due to fall) and Back Pain  Initial Vital Signs:  Pulse/HCG Rate: 73ECG Heart Rate: 73 Temp: 97.7 F (36.5 C) Resp: 17 BP: (!) 100/56 SpO2: 98 %  BMI: Estimated body mass index is 39.86 kg/m as calculated from the following:   Height as of this encounter: 5\' 3"  (1.6 m).   Weight as of this encounter: 225 lb (102.1 kg).  Risk Assessment: Allergies: Reviewed. She is allergic to aspirin; ciprofloxacin; etanercept; metformin; methotrexate; mushroom extract complex; other; darvon [propoxyphene]; minocycline; and cobalamin combinations.  Allergy Precautions: None required Coagulopathies: Reviewed. None identified.  Blood-thinner therapy: None at this time Active Infection(s): Reviewed. None identified. Ms. Maniscalco is afebrile  Site Confirmation: Ms. Mauney was asked to confirm the procedure and laterality before marking the site Procedure checklist: Completed Consent: Before the procedure and under the influence of no sedative(s), amnesic(s), or anxiolytics, the patient was informed of the treatment options, risks and possible complications. To fulfill our ethical and legal obligations, as recommended by the American Medical Association's Code of Ethics, I have informed the patient of my clinical  impression; the nature and purpose of the treatment or procedure; the risks, benefits, and possible complications of the  intervention; the alternatives, including doing nothing; the risk(s) and benefit(s) of the alternative treatment(s) or procedure(s); and the risk(s) and benefit(s) of doing nothing. The patient was provided information about the general risks and possible complications associated with the procedure. These may include, but are not limited to: failure to achieve desired goals, infection, bleeding, organ or nerve damage, allergic reactions, paralysis, and death. In addition, the patient was informed of those risks and complications associated to the procedure, such as failure to decrease pain; infection; bleeding; organ or nerve damage with subsequent damage to sensory, motor, and/or autonomic systems, resulting in permanent pain, numbness, and/or weakness of one or several areas of the body; allergic reactions; (i.e.: anaphylactic reaction); and/or death. Furthermore, the patient was informed of those risks and complications associated with the medications. These include, but are not limited to: allergic reactions (i.e.: anaphylactic or anaphylactoid reaction(s)); adrenal axis suppression; blood sugar elevation that in diabetics may result in ketoacidosis or comma; water retention that in patients with history of congestive heart failure may result in shortness of breath, pulmonary edema, and decompensation with resultant heart failure; weight gain; swelling or edema; medication-induced neural toxicity; particulate matter embolism and blood vessel occlusion with resultant organ, and/or nervous system infarction; and/or aseptic necrosis of one or more joints. Finally, the patient was informed that Medicine is not an exact science; therefore, there is also the possibility of unforeseen or unpredictable risks and/or possible complications that may result in a catastrophic outcome. The patient  indicated having understood very clearly. We have given the patient no guarantees and we have made no promises. Enough time was given to the patient to ask questions, all of which were answered to the patient's satisfaction. Ms. Rober has indicated that she wanted to continue with the procedure. Attestation: I, the ordering provider, attest that I have discussed with the patient the benefits, risks, side-effects, alternatives, likelihood of achieving goals, and potential problems during recovery for the procedure that I have provided informed consent. Date  Time: 01/10/2020  7:54 AM  Pre-Procedure Preparation:  Monitoring: As per clinic protocol. Respiration, ETCO2, SpO2, BP, heart rate and rhythm monitor placed and checked for adequate function Safety Precautions: Patient was assessed for positional comfort and pressure points before starting the procedure. Time-out: I initiated and conducted the "Time-out" before starting the procedure, as per protocol. The patient was asked to participate by confirming the accuracy of the "Time Out" information. Verification of the correct person, site, and procedure were performed and confirmed by me, the nursing staff, and the patient. "Time-out" conducted as per Joint Commission's Universal Protocol (UP.01.01.01). Time: [phone]  Description of Procedure:          Target Area: Area medial to the occipital artery at the level of the superior nuchal ridge Approach: Posterior approach Area Prepped: Entire Posterior Occipital Region Prepping solution: DuraPrep (Iodine Povacrylex [0.7% available iodine] and Isopropyl Alcohol, 74% w/w) Safety Precautions: Aspiration looking for blood return was conducted prior to all injections. At no point did we inject any substances, as a needle was being advanced. No attempts were made at seeking any paresthesias. Safe injection practices and needle disposal techniques used. Medications properly checked for expiration dates. SDV  (single dose vial) medications used. Description of the Procedure: Protocol guidelines were followed. The target area was identified and the area prepped in the usual manner. Skin & deeper tissues infiltrated with local anesthetic. Appropriate amount of time allowed to pass for local anesthetics to take effect. The procedure needles were  then advanced to the target area. Proper needle placement secured. Negative aspiration confirmed. Solution injected in intermittent fashion, asking for systemic symptoms every 0.5cc of injectate. The needles were then removed and the area cleansed, making sure to leave some of the prepping solution back to take advantage of its long term bactericidal properties.  Vitals:   01/10/20 0850 01/10/20 0900 01/10/20 0910 01/10/20 0918  BP:  (!) 96/52 95/74 99/68   Pulse:      Resp: 16 14 12 13   Temp:      TempSrc:      SpO2: 100% 95% 96% 97%  Weight:      Height:        Start Time: 0843 hrs. End Time: 0850 hrs. Materials:  Needle(s) Type: Spinal Needle Gauge: 22G Length: 3.5-in Medication(s): Please see orders for medications and dosing details.  Imaging Guidance (Non-Spinal):          Type of Imaging Technique: Fluoroscopy Guidance (Non-Spinal) Indication(s): Assistance in needle guidance and placement for procedures requiring needle placement in or near specific anatomical locations not easily accessible without such assistance. Exposure Time: Please see nurses notes. Contrast: None used. Fluoroscopic Guidance: I was personally present during the use of fluoroscopy. "Tunnel Vision Technique" used to obtain the best possible view of the target area. Parallax error corrected before commencing the procedure. "Direction-depth-direction" technique used to introduce the needle under continuous pulsed fluoroscopy. Once target was reached, antero-posterior, oblique, and lateral fluoroscopic projection used confirm needle placement in all planes. Images permanently  stored in EMR. Interpretation: No contrast injected. I personally interpreted the imaging intraoperatively. Adequate needle placement confirmed in multiple planes. Permanent images saved into the patient's record.  Antibiotic Prophylaxis:   Anti-infectives (From admission, onward)   None     Indication(s): None identified  Post-operative Assessment:  Post-procedure Vital Signs:  Pulse/HCG Rate: 7365 Temp: 97.7 F (36.5 C) Resp: 13 BP: 99/68 SpO2: 97 %  EBL: None  Complications: No immediate post-treatment complications observed by team, or reported by patient.  Note: The patient tolerated the entire procedure well. A repeat set of vitals were taken after the procedure and the patient was kept under observation following institutional policy, for this type of procedure. Post-procedural neurological assessment was performed, showing return to baseline, prior to discharge. The patient was provided with post-procedure discharge instructions, including a section on how to identify potential problems. Should any problems arise concerning this procedure, the patient was given instructions to immediately contact , at any time, without hesitation. In any case, we plan to contact the patient by telephone for a follow-up status report regarding this interventional procedure.  Comments:  No additional relevant information.  Plan of Care  Orders:  Orders Placed This Encounter  Procedures  . GREATER OCCIPITAL NERVE BLOCK    Scheduling Instructions:     Procedure: Occipital nerve block     Laterality:  (Left)     Sedation: Patient's choice.     Timeframe: Today    Order Specific Question:   Where will this procedure be performed?    Answer:   [facility]  . [facility] C-ARM 1-60 MIN NO REPORT    Intraoperative interpretation by procedural physician at [facility].    Standing Status:   Standing    Number of Occurrences:   1    Order Specific Question:   Reason  for exam:    Answer:   Assistance in needle guidance and placement for procedures requiring needle placement  in or near specific anatomical locations not easily accessible without such assistance.  . Informed Consent Details: Physician/Practitioner Attestation; Transcribe to consent form and obtain patient signature    Nursing Order: Transcribe to consent form and obtain patient signature. Note: Always confirm laterality of pain with Ms. Mancia, before procedure. Procedure: Occipital Nerve Block Indication/Reason: Occipital Headache (Cervicogenic Headache) secondary to Occipital Neuralgia/Neuritis and/or upper cervical radiculitis. Provider Attestation: I, Kylor Valverde A. Laban Emperor, MD, (Pain Management Specialist), the physician/practitioner, attest that I have discussed with the patient the benefits, risks, side effects, alternatives, likelihood of achieving goals and potential problems during recovery for the procedure that I have provided informed consent.  . Provide equipment / supplies at bedside    Equipment required: Single use, disposable, "Block Tray"    Standing Status:   Standing    Number of Occurrences:   1    Order Specific Question:   Specify    Answer:   Block Tray   Chronic Opioid Analgesic:  Oxycodone IR 5 mg, 1 tab PO q 8 hrs (15 mg/day of oxycodone) MME/day:22.5 mg/day.   Medications ordered for procedure: Meds ordered this encounter  Medications  . lidocaine (XYLOCAINE) 2 % (with pres) injection 400 mg  . lactated ringers infusion 1,000 mL  . midazolam (VERSED) 5 MG/5ML injection 1-2 mg    Make sure Flumazenil is available in the pyxis when using this medication. If oversedation occurs, administer 0.2 mg IV over 15 sec. If after 45 sec no response, administer 0.2 mg again over 1 min; may repeat at 1 min intervals; not to exceed 4 doses (1 mg)  . fentaNYL (SUBLIMAZE) injection 25-50 mcg    Make sure Narcan is available in the pyxis when using this medication. In the event  of respiratory depression (RR< 8/min): Titrate NARCAN (naloxone) in increments of 0.1 to 0.2 mg IV at 2-3 minute intervals, until desired degree of reversal.  . glycopyrrolate (ROBINUL) injection 0.2 mg  . ropivacaine (PF) 2 mg/mL (0.2%) (NAROPIN) injection 5 mL  . dexamethasone (DECADRON) injection 10 mg   Medications administered: We administered lidocaine, lactated ringers, midazolam, fentaNYL, glycopyrrolate, ropivacaine (PF) 2 mg/mL (0.2%), and dexamethasone.  See the medical record for exact dosing, route, and time of administration.  Follow-up plan:   Return in about 2 weeks (around 01/24/2020) for (VV), (PP).       Interventional management options:  Considering: NOTE: NO RFA(Poor candidate due to delayed sensory perceptionand intra-procedural non-compliance.) No additional procedures planned at this time.   Prior & Palliative treatment(s): Palliative bilateral lumbar facet block #3(NO RFA)(for low back pain) Palliative left CESI #3  Palliative right CESI #2  Palliative left-sided C2 + TON #2  Palliativeleft cervical facet block(NO RFA)(for left-sided neck pain)  Palliative left cervical facet RFA #3 (last done 05/04/2018) (not to be repeated due to delayed sensory perceptionand intra-procedural non-compliance.)(Poor candidate) Palliative left GONB #6(NORFA) (for left-sided headaches) Palliative right GONB #2     Recent Visits Date Type Provider Dept  11/28/19 Telemedicine Delano Metz, MD [facility]  Showing recent visits within past 90 days and meeting all other requirements   Today's Visits Date Type Provider Dept  01/10/20 Procedure visit Delano Metz, MD [facility]  Showing today's visits and meeting all other requirements   Future Appointments Date Type Provider Dept  01/25/20 Appointment Delano Metz, MD [facility]  Showing future appointments within next 90 days and meeting all other  requirements   Disposition: Discharge home  Discharge (Date  Time): 01/10/2020; 0920 hrs.   Primary Care Physician: Sharyne Peach, MD Location: [facility] Note by: Gaspar Cola, MD Date: 01/10/2020; Time: 10:18 AM  Disclaimer:  Medicine is not an Chief Strategy Officer. The only guarantee in medicine is that nothing is guaranteed. It is important to note that the decision to proceed with this intervention was based on the information collected from the patient. The Data and conclusions were drawn from the patient's questionnaire, the interview, and the physical examination. Because the information was provided in large part by the patient, it cannot be guaranteed that it has not been purposely or unconsciously manipulated. Every effort has been made to obtain as much relevant data as possible for this evaluation. It is important to note that the conclusions that lead to this procedure are derived in large part from the available data. Always take into account that the treatment will also be dependent on availability of resources and existing treatment guidelines, considered by other Pain Management Practitioners as being common knowledge and practice, at the time of the intervention. For Medico-Legal purposes, it is also important to point out that variation in procedural techniques and pharmacological choices are the acceptable norm. The indications, contraindications, technique, and results of the above procedure should only be interpreted and judged by a Board-Certified Interventional Pain Specialist with extensive familiarity and expertise in the same exact procedure and technique.

## 2020-01-10 NOTE — Patient Instructions (Signed)

## 2020-01-11 ENCOUNTER — Telehealth: Payer: Self-pay | Admitting: *Deleted

## 2020-01-11 NOTE — Telephone Encounter (Signed)
Voicemail left for patient re; procedure on yesterday.

## 2020-01-24 ENCOUNTER — Telehealth: Payer: Self-pay | Admitting: *Deleted

## 2020-01-24 NOTE — Progress Notes (Signed)
Patient: Amber Acosta  Service Category: E/M  Provider: Gaspar Cola, MD  DOB: [birth year]  DOS: 01/25/2020  Location: Office  MRN: [id number]  Setting: Ambulatory outpatient  Referring Provider: Sharyne Peach, MD  Type: Established Patient  Specialty: Interventional Pain Management  PCP: Sharyne Peach, MD  Location: Remote location  Delivery: TeleHealth     Virtual Encounter - Pain Management PROVIDER NOTE: Information contained herein reflects review and annotations entered in association with encounter. Interpretation of such information and data should be left to medically-trained personnel. Information provided to patient can be located elsewhere in the medical record under "Patient Instructions". Document created using STT-dictation technology, any transcriptional errors that may result from process are unintentional.    Contact & Pharmacy Preferred: [phone] Home: [phone] (home) Mobile: [phone] (mobile) E-mail: [email or web address] .[email or web address] [address] [place], [address] [place] [address] Phone: [phone] Fax: [phone]   Pre-screening  Ms. Sapien offered "in-person" vs "virtual" encounter. She indicated preferring virtual for this encounter.   Reason COVID-19*  Social distancing based on CDC and AMA recommendations.   I contacted Zonie Jamroz on 01/25/2020 via telephone.      I clearly identified myself as Gaspar Cola, MD. I verified that I was speaking with the correct person using two identifiers (Name: [name], and date of birth: 20-Jan-1951).  Consent I sought verbal advanced consent from Idell Pickles for virtual visit interactions. I informed Ms. Stingley of possible security and privacy concerns, risks, and limitations associated with providing "not-in-person" medical evaluation and management services. I also informed Ms. Storr of the availability of "in-person"  appointments. Finally, I informed her that there would be a charge for the virtual visit and that she could be  personally, fully or partially, financially responsible for it. Ms. Ryle expressed understanding and agreed to proceed.   Historic Elements   Amber Acosta is a 69 y.o. year old, female patient evaluated today after her last contact with our practice on 01/24/2020. Amber Acosta  has a past medical history of Arthritis, Cervical spondylosis without myelopathy (09/21/2015), Chronic pain, Chronic pain associated with significant psychosocial dysfunction (08/28/2013), Depression, Diabetes mellitus without complication ([place]), Displacement of cervical intervertebral disc without myelopathy (09/05/2015), Fibromyalgia, GERD (gastroesophageal reflux disease), Hypertension, Kidney disease, chronic, stage III (GFR 30-59 ml/min), Sarcoid, and Thrombopenia ([place]). She also  has a past surgical history that includes neck fusion; Breast surgery; Back surgery; Knee arthroscopy; Fracture surgery; Hand surgery; Abdominal hysterectomy; and Cyst excision. Amber Acosta has a current medication list which includes the following prescription(s): advair diskus, albuterol, amlodipine, vitamin d3, cyanocobalamin, doxycycline, fluoxetine, furosemide, gabapentin, insulin regular, klor-con 10, liraglutide, losartan, lovastatin, metoprolol succinate, nitroglycerin, omeprazole, [START ON 01/28/2020] oxycodone, [START ON 02/27/2020] oxycodone, [START ON 03/28/2020] oxycodone, tizanidine, trazodone, hydralazine, and insulin nph human. She  reports that she has quit smoking. She has quit using smokeless tobacco. She reports that she does not drink alcohol or use drugs. [name] is allergic to aspirin; ciprofloxacin; etanercept; metformin; methotrexate; mushroom extract complex; other; darvon [propoxyphene]; minocycline; and cobalamin combinations.   HPI  Today, she is being contacted for both, medication management and a post-procedure  assessment.  The patient indicates doing well with the current medication regimen. No adverse reactions or side effects reported to the medications.  The patient indicates doing great after the left greater occipital nerve block.  In fact, she indicates not  having had any pain in the area since the injection.  However, she has been experiencing a little bit more of the bilateral low back pain and she would like to have a as needed order entered for a possible injection in the area.  Post-Procedure Evaluation  Procedure (01/10/2020): Palliative left greater occipital nerve block #6 under fluoroscopic guidance and IV sedation Pre-procedure pain level:  7/10 Post-procedure: 0/10 (100% relief)  Sedation: Sedation provided.  Janett Billow, RN  01/24/2020  1:55 PM  Sign when Signing Visit Pain relief after procedure (treated area only): (Questions asked to patient) 1. Starting about 15 minutes after the procedure, and "while the area was still numb" (from the local anesthetics), were you having any of your usual pain "in that area" (the treated area)?  (NOTE: NOT including the discomfort from the needle sticks.) First 1 hour: 100 % better. First 4-6 hours: 100 % better. 2. How long did the numbness from the local anesthetics last? (More than 6 hours?) Duration: 24 hours.  3. How much better is your pain now, when compared to before the procedure? Current benefit: 100 % better. 4. Can you move better now? Improvement in ROM (Range of Motion): Yes. 5. Can you do more now? Improvement in function: Yes. 4. Did you have any problems with the procedure? Side-effects/Complications: No.  Current benefits: Defined as benefit that persist at this time.   Analgesia:  90-100% better Function: Amber Acosta reports improvement in function ROM: Amber Acosta reports improvement in ROM  Pharmacotherapy Assessment  Analgesic: Oxycodone IR 5 mg, 1 tab PO q 8 hrs (15 mg/day of oxycodone) MME/day:22.5 mg/day.    Monitoring: [place] PMP: PDMP reviewed during this encounter.       Pharmacotherapy: No side-effects or adverse reactions reported. Compliance: No problems identified. Effectiveness: Clinically acceptable. Plan: Refer to "POC".  UDS:  Summary  Date Value Ref Range Status  02/08/2019 FINAL  Final    Comment:    ==================================================================== TOXASSURE SELECT 13 (MW) ==================================================================== Test                             Result       Flag       Units Drug Present and Declared for Prescription Verification   Oxycodone                      1510         EXPECTED   ng/mg creat   Noroxycodone                   1962         EXPECTED   ng/mg creat    Sources of oxycodone include scheduled prescription medications.    Noroxycodone is an expected metabolite of oxycodone. ==================================================================== Test                      Result    Flag   Units      Ref Range   Creatinine              82               mg/dL      >=20 ==================================================================== Declared Medications:  The flagging and interpretation on this report are based on the  following declared medications.  Unexpected results may arise from  inaccuracies in the declared medications.  **Note: The testing scope  of this panel includes these medications:  Oxycodone  **Note: The testing scope of this panel does not include following  reported medications:  Albuterol  Amlodipine Besylate  Fluoxetine  Fluticasone  Furosemide  Gabapentin  Hydralazine  Insulin  Insulin (Humulin)  Losartan (Losartan Potassium)  Lovastatin  Metoprolol  Nitroglycerin  Omeprazole  Potassium  Ranitidine  Salmeterol  Trazodone ==================================================================== For clinical consultation, please call [phone]. ====================================================================    Laboratory Chemistry Profile   Renal Lab Results  Component Value Date   BUN 14 03/26/2016   CREATININE 1.28 (H) 03/26/2016   GFRAA 50 (L) 03/26/2016   GFRNONAA 44 (L) 03/26/2016    Hepatic Lab Results  Component Value Date   AST 24 03/26/2016   ALT 20 03/26/2016   ALBUMIN 4.0 03/26/2016   ALKPHOS 70 03/26/2016    Electrolytes Lab Results  Component Value Date   NA 136 03/26/2016   K 3.8 03/26/2016   CL 100 (L) 03/26/2016   CALCIUM 8.5 (L) 03/26/2016   MG 2.1 06/22/2017    Bone Lab Results  Component Value Date   VD25OH 20.2 (L) 03/26/2016    Inflammation (CRP: Acute Phase) (ESR: Chronic Phase) Lab Results  Component Value Date   CRP 2.1 (H) 03/26/2016   ESRSEDRATE 52 (H) 03/26/2016      Note: Above Lab results reviewed.  Imaging  DG PAIN CLINIC C-ARM 1-60 MIN NO REPORT Fluoro was used, but no Radiologist interpretation will be provided.  Please refer to "NOTES" tab for provider progress note.  Assessment  The primary encounter diagnosis was Chronic pain syndrome. Diagnoses of Cervicalgia, Cervicogenic headache (Left), Chronic Occipital neuralgia (Left), and Lumbar facet syndrome (Bilateral) (R>L) were also pertinent to this visit.  Plan of Care  Problem-specific:  No problem-specific Assessment & Plan notes found for this encounter.  Ms. Kileigh Ortmann has a current medication list which includes the following long-term medication(s): advair diskus, amlodipine, fluoxetine, furosemide, gabapentin, insulin regular, klor-con 10, liraglutide, losartan, lovastatin, nitroglycerin, omeprazole, [START ON 01/28/2020] oxycodone, [START ON 02/27/2020] oxycodone, [START ON 03/28/2020] oxycodone, tizanidine, trazodone, hydralazine, and insulin nph human.  Pharmacotherapy (Medications Ordered): Meds ordered this encounter  Medications  . oxyCODONE (OXY IR/ROXICODONE) 5 MG immediate release tablet     Sig: Take 1 tablet (5 mg total) by mouth every 8 (eight) hours as needed for severe pain. Must last 30 days    Dispense:  90 tablet    Refill:  0    Chronic Pain: STOP Act (Not applicable) Fill 1 day early if closed on refill date. Do not fill until: 02/27/2020. To last until: 03/28/2020. Avoid benzodiazepines within 8 hours of opioids  . oxyCODONE (OXY IR/ROXICODONE) 5 MG immediate release tablet    Sig: Take 1 tablet (5 mg total) by mouth every 8 (eight) hours as needed for severe pain. Must last 30 days    Dispense:  90 tablet    Refill:  0    Chronic Pain: STOP Act (Not applicable) Fill 1 day early if closed on refill date. Do not fill until: 03/28/2020. To last until: 04/27/2020. Avoid benzodiazepines within 8 hours of opioids   Orders:  Orders Placed This Encounter  Procedures  . LUMBAR FACET(MEDIAL BRANCH NERVE BLOCK) MBNB    Scheduling timeframe: (PRN procedure) Ms. Gittleman will call when needed. Clinical indication: Axial low back pain. 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  Sedation: Usually done with sedation. (May be done without sedation if so desired by patient.) Requirements: NPO x 8 hrs.; Driver; Stop  blood thinners. Interval: No sooner than two weeks for diagnostic or therapeutic. No sooner than every other month for palliative.    Standing Status:   Standing    Number of Occurrences:   5    Standing Expiration Date:   01/24/2021    Scheduling Instructions:     Procedure: Lumbar facet block (AKA.: Lumbosacral medial branch nerve block)     Level: L3-4, L4-5, & L5-S1 Facets (L2, L3, L4, L5, & S1 Medial Branch Nerves)     Laterality: Bilateral    Order Specific Question:   Where will this procedure be performed?    Answer:   [facility]  . GREATER OCCIPITAL NERVE BLOCK    Standing Status:   Standing    Number of Occurrences:   6    Standing Expiration Date:   01/24/2021    Scheduling Instructions:     Procedure: Occipital nerve block     Laterality: Left-sided      Sedation: With Sedation.     Timeframe: PRN Procedure. Patient will call.    Order Specific Question:   Where will this procedure be performed?    Answer:   [facility]   Follow-up plan:   Return in about 13 weeks (around 04/25/2020) for (VV), (MM), in addition, PRN Procedure(s).      Interventional management options:  Considering: NOTE: NO RFA(Poor candidate due to delayed sensory perceptionand intra-procedural non-compliance.) No additional procedures planned at this time.   Prior & Palliative treatment(s): Palliative bilateral lumbar facet block #3(NO RFA)(for low back pain) Palliative left CESI #3  Palliative right CESI #2  Palliative left-sided C2 + TON #2  Palliativeleft cervical facet block(NO RFA)(for left-sided neck pain)  Palliative left cervical facet RFA #3 (last done 05/04/2018) (not to be repeated due to delayed sensory perceptionand intra-procedural non-compliance.)(Poor candidate) Palliative left GONB #7(NORFA) (for left-sided headaches) Palliative right GONB #2     Recent Visits Date Type Provider Dept  01/10/20 Procedure visit Milinda Pointer, MD [facility]  11/28/19 Telemedicine Milinda Pointer, MD [facility]  Showing recent visits within past 90 days and meeting all other requirements   Today's Visits Date Type Provider Dept  01/25/20 Telemedicine Milinda Pointer, MD [facility]  Showing today's visits and meeting all other requirements   Future Appointments No visits were found meeting these conditions.  Showing future appointments within next 90 days and meeting all other requirements   I discussed the assessment and treatment plan with the patient. The patient was provided an opportunity to ask questions and all were answered. The patient agreed with the plan and demonstrated an understanding of the instructions.  Patient advised to call back or seek an in-person evaluation if the  symptoms or condition worsens.  Duration of encounter: 14 minutes.  Note by: Gaspar Cola, MD Date: 01/25/2020; Time: 9:17 AM

## 2020-01-24 NOTE — Progress Notes (Signed)
Pain relief after procedure (treated area only): (Questions asked to patient) 1. Starting about 15 minutes after the procedure, and "while the area was still numb" (from the local anesthetics), were you having any of your usual pain "in that area" (the treated area)?  (NOTE: NOT including the discomfort from the needle sticks.) First 1 hour: 100 % better. First 4-6 hours: 100 % better. 2. How long did the numbness from the local anesthetics last? (More than 6 hours?) Duration: 24 hours.  3. How much better is your pain now, when compared to before the procedure? Current benefit: 100 % better. 4. Can you move better now? Improvement in ROM (Range of Motion): Yes. 5. Can you do more now? Improvement in function: Yes. 4. Did you have any problems with the procedure? Side-effects/Complications: No. 

## 2020-01-25 ENCOUNTER — Other Ambulatory Visit: Payer: Self-pay

## 2020-01-25 ENCOUNTER — Ambulatory Visit: Payer: Medicare Other | Attending: Pain Medicine | Admitting: Pain Medicine

## 2020-01-25 DIAGNOSIS — G894 Chronic pain syndrome: Secondary | ICD-10-CM | POA: Diagnosis not present

## 2020-01-25 DIAGNOSIS — M47816 Spondylosis without myelopathy or radiculopathy, lumbar region: Secondary | ICD-10-CM

## 2020-01-25 DIAGNOSIS — M542 Cervicalgia: Secondary | ICD-10-CM

## 2020-01-25 DIAGNOSIS — M5481 Occipital neuralgia: Secondary | ICD-10-CM | POA: Diagnosis not present

## 2020-01-25 DIAGNOSIS — G4486 Cervicogenic headache: Secondary | ICD-10-CM

## 2020-01-25 DIAGNOSIS — R519 Headache, unspecified: Secondary | ICD-10-CM

## 2020-01-25 MED ORDER — OXYCODONE HCL 5 MG PO TABS: 5.0000 mg | ORAL_TABLET | Freq: Three times a day (TID) | ORAL | 0 refills | Status: DC | PRN

## 2020-02-26 ENCOUNTER — Other Ambulatory Visit: Payer: Self-pay | Admitting: Pain Medicine

## 2020-02-26 DIAGNOSIS — M545 Low back pain, unspecified: Secondary | ICD-10-CM

## 2020-02-26 DIAGNOSIS — G8929 Other chronic pain: Secondary | ICD-10-CM

## 2020-02-26 DIAGNOSIS — M7918 Myalgia, other site: Secondary | ICD-10-CM

## 2020-03-23 ENCOUNTER — Encounter: Payer: Self-pay | Admitting: Pain Medicine

## 2020-03-23 DIAGNOSIS — I110 Hypertensive heart disease with heart failure: Secondary | ICD-10-CM | POA: Insufficient documentation

## 2020-03-23 DIAGNOSIS — I5030 Unspecified diastolic (congestive) heart failure: Secondary | ICD-10-CM | POA: Insufficient documentation

## 2020-03-23 NOTE — Progress Notes (Signed)
Patient: Amber Acosta  Service Category: E/M  Provider: Gaspar Cola, MD  DOB: 02-04-1951  DOS: 03/26/2020  Location: Office  MRN: [id number]  Setting: Ambulatory outpatient  Referring Provider: Sharyne Peach, MD  Type: Established Patient  Specialty: Interventional Pain Management  PCP: Amber Peach, MD  Location: Remote location  Delivery: TeleHealth     Virtual Encounter - Pain Management PROVIDER NOTE: Information contained herein reflects review and annotations entered in association with encounter. Interpretation of such information and data should be left to medically-trained personnel. Information provided to patient can be located elsewhere in the medical record under "Patient Instructions". Document created using STT-dictation technology, any transcriptional errors that may result from process are unintentional.    Contact & Pharmacy Preferred: [phone] Home: [phone] (home) Mobile: [phone] (mobile) E-mail: [email or web address]  [facility] [address] ([address]), [place] - [place] ([place])  [address] Phone: [phone] Fax: [phone]   Pre-screening  Amber Acosta offered "in-person" vs "virtual" encounter. She indicated preferring virtual for this encounter.   Reason COVID-19*  Social distancing based on CDC and AMA recommendations.   I contacted Amber Acosta on 03/26/2020 via telephone.      I clearly identified myself as [birth year]/08/2018, MD. I verified that I was speaking with the correct person using two identifiers (Name: Amber Acosta, and date of birth: [birth year]).  Consent I sought verbal advanced consent from 67/08/1951 for virtual visit interactions. I informed Amber Acosta of possible security and privacy concerns, risks, and limitations associated with providing "not-in-person" medical evaluation and management services. I also informed Amber Acosta of the availability of "in-person" appointments.  Finally, I informed her that there would be a charge for the virtual visit and that she could be  personally, fully or partially, financially responsible for it. Ms. Kaney expressed understanding and agreed to proceed.   Historic Elements   Amber Acosta is a 69 y.o. year old, female patient evaluated today after her last contact with our practice on 02/26/2020. Amber Acosta  has a past medical history of Arthritis, Cervical spondylosis without myelopathy (09/21/2015), Chronic pain, Chronic pain associated with significant psychosocial dysfunction (08/28/2013), Depression, Diabetes mellitus without complication ([place]), Displacement of cervical intervertebral disc without myelopathy (09/05/2015), Fibromyalgia, GERD (gastroesophageal reflux disease), Hypertension, Kidney disease, chronic, stage III (GFR 30-59 ml/min), Sarcoid, and Thrombopenia ([place]). She also  has a past surgical history that includes neck fusion; Breast surgery; Back surgery; Knee arthroscopy; Fracture surgery; Hand surgery; Abdominal hysterectomy; and Cyst excision. Amber Acosta has a current medication list which includes the following prescription(s): albuterol, amlodipine, vitamin d3, cyanocobalamin, fluoxetine, furosemide, gabapentin, [START ON 05/27/2020] gabapentin, hydralazine, insulin nph human, insulin regular, klor-con 10, liraglutide, losartan, lovastatin, metoprolol succinate, nitroglycerin, omeprazole, [START ON 03/28/2020] oxycodone, [START ON 04/27/2020] oxycodone, [START ON 05/27/2020] oxycodone, tizanidine, [START ON 05/27/2020] tizanidine, trazodone, advair diskus, and doxycycline. She  reports that she has quit smoking. She has quit using smokeless tobacco. She reports that she does not drink alcohol or use drugs. Amber Acosta is allergic to aspirin; ciprofloxacin; etanercept; metformin; methotrexate; mushroom extract complex; other; darvon [propoxyphene]; minocycline; and cobalamin combinations.   HPI  Today, she is being contacted for  medication management.  The patient indicates doing well with the current medication regimen. No adverse reactions or side effects reported to the medications.   Pharmacotherapy Assessment  Analgesic: Oxycodone IR 5 mg, 1 tab PO q 8 hrs (15 mg/day of oxycodone) MME/day:22.5 mg/day.  Monitoring: [place] PMP: PDMP reviewed during this encounter.       Pharmacotherapy: No side-effects or adverse reactions reported. Compliance: No problems identified. Effectiveness: Clinically acceptable. Plan: Refer to "POC".  UDS:  Summary  Date Value Ref Range Status  02/08/2019 FINAL  Final    Comment:    ==================================================================== TOXASSURE SELECT 13 (MW) ==================================================================== Test                             Result       Flag       Units Drug Present and Declared for Prescription Verification   Oxycodone                      1510         EXPECTED   ng/mg creat   Noroxycodone                   1962         EXPECTED   ng/mg creat    Sources of oxycodone include scheduled prescription medications.    Noroxycodone is an expected metabolite of oxycodone. ==================================================================== Test                      Result    Flag   Units      Ref Range   Creatinine              82               mg/dL      >=20 ==================================================================== Declared Medications:  The flagging and interpretation on this report are based on the  following declared medications.  Unexpected results may arise from  inaccuracies in the declared medications.  **Note: The testing scope of this panel includes these medications:  Oxycodone  **Note: The testing scope of this panel does not include following  reported medications:  Albuterol  Amlodipine Besylate  Fluoxetine  Fluticasone  Furosemide  Gabapentin  Hydralazine  Insulin  Insulin (Humulin)  Losartan  (Losartan Potassium)  Lovastatin  Metoprolol  Nitroglycerin  Omeprazole  Potassium  Ranitidine  Salmeterol  Trazodone ==================================================================== For clinical consultation, please call [phone]. ====================================================================    Laboratory Chemistry Profile   Renal Lab Results  Component Value Date   BUN 14 03/26/2016   CREATININE 1.28 (H) 03/26/2016   GFRAA 50 (L) 03/26/2016   GFRNONAA 44 (L) 03/26/2016     Hepatic Lab Results  Component Value Date   AST 24 03/26/2016   ALT 20 03/26/2016   ALBUMIN 4.0 03/26/2016   ALKPHOS 70 03/26/2016     Electrolytes Lab Results  Component Value Date   NA 136 03/26/2016   K 3.8 03/26/2016   CL 100 (L) 03/26/2016   CALCIUM 8.5 (L) 03/26/2016   MG 2.1 06/22/2017     Bone Lab Results  Component Value Date   VD25OH 20.2 (L) 03/26/2016     Inflammation (CRP: Acute Phase) (ESR: Chronic Phase) Lab Results  Component Value Date   CRP 2.1 (H) 03/26/2016   ESRSEDRATE 52 (H) 03/26/2016       Note: Above Lab results reviewed.  Imaging  DG PAIN CLINIC C-ARM 1-60 MIN NO REPORT Fluoro was used, but no Radiologist interpretation will be provided.  Please refer to "NOTES" tab for provider progress note.  Assessment  Diagnoses of Chronic pain syndrome, Myofascial pain, Chronic low back pain (Primary Area  of Pain) (Bilateral) (R>L), and Neurogenic pain were pertinent to this visit.  Plan of Care  Problem-specific:  No problem-specific Assessment & Plan notes found for this encounter.  Amber Acosta has a current medication list which includes the following long-term medication(s): amlodipine, fluoxetine, furosemide, gabapentin, [START ON 05/27/2020] gabapentin, hydralazine, insulin nph human, insulin regular, klor-con 10, liraglutide, losartan, lovastatin, nitroglycerin, omeprazole, [START ON 03/28/2020] oxycodone, [START ON 04/27/2020] oxycodone,  [START ON 05/27/2020] oxycodone, tizanidine, [START ON 05/27/2020] tizanidine, trazodone, and advair diskus.  Pharmacotherapy (Medications Ordered): Meds ordered this encounter  Medications  . oxyCODONE (OXY IR/ROXICODONE) 5 MG immediate release tablet    Sig: Take 1 tablet (5 mg total) by mouth every 8 (eight) hours as needed for severe pain. Must last 30 days    Dispense:  90 tablet    Refill:  0    Chronic Pain: STOP Act (Not applicable) Fill 1 day early if closed on refill date. Do not fill until: 04/27/2020. To last until: 05/27/2020. Avoid benzodiazepines within 8 hours of opioids  . oxyCODONE (OXY IR/ROXICODONE) 5 MG immediate release tablet    Sig: Take 1 tablet (5 mg total) by mouth every 8 (eight) hours as needed for severe pain. Must last 30 days    Dispense:  90 tablet    Refill:  0    Chronic Pain: STOP Act (Not applicable) Fill 1 day early if closed on refill date. Do not fill until: 05/27/2020. To last until: 06/26/2020. Avoid benzodiazepines within 8 hours of opioids  . tiZANidine (ZANAFLEX) 4 MG tablet    Sig: Take 1 tablet (4 mg total) by mouth 2 (two) times daily as needed for muscle spasms.    Dispense:  60 tablet    Refill:  2    Fill one day early if pharmacy is closed on scheduled refill date. May substitute for generic if available.  . gabapentin (NEURONTIN) 400 MG capsule    Sig: Take 1 capsule (400 mg total) by mouth at bedtime.    Dispense:  30 capsule    Refill:  5    Fill one day early if pharmacy is closed on scheduled refill date. May substitute for generic if available.   Orders:  No orders of the defined types were placed in this encounter.  Follow-up plan:   Return in about 3 months (around 06/25/2020) for (F2F), (MM).      Interventional management options:  Considering: NOTE: NO RFA(Poor candidate due to delayed sensory perceptionand intra-procedural non-compliance.) No additional procedures planned at this time.   Prior & Palliative  treatment(s): Palliative bilateral lumbar facet block #3(NO RFA)(for low back pain) Palliative left CESI #3  Palliative right CESI #2  Palliative left-sided C2 + TON #2  Palliativeleft cervical facet block(NO RFA)(for left-sided neck pain)  Palliative left cervical facet RFA #3 (last done 05/04/2018) (not to be repeated due to delayed sensory perceptionand intra-procedural non-compliance.)(Poor candidate) Palliative left GONB #7(NORFA) (for left-sided headaches) Palliative right GONB #2      Recent Visits Date Type Provider Dept  01/25/20 Telemedicine Milinda Pointer, MD [facility]  01/10/20 Procedure visit Milinda Pointer, MD [facility]  Showing recent visits within past 90 days and meeting all other requirements   Today's Visits Date Type Provider Dept  03/26/20 Telemedicine Milinda Pointer, MD [facility]  Showing today's visits and meeting all other requirements   Future Appointments No visits were found meeting these conditions.  Showing future appointments within next 90 days and  meeting all other requirements   I discussed the assessment and treatment plan with the patient. The patient was provided an opportunity to ask questions and all were answered. The patient agreed with the plan and demonstrated an understanding of the instructions.  Patient advised to call back or seek an in-person evaluation if the symptoms or condition worsens.  Duration of encounter: 12 minutes.  Note by: Amber Cola, MD Date: 03/26/2020; Time: 9:13 AM

## 2020-03-26 ENCOUNTER — Ambulatory Visit: Payer: Medicare Other | Attending: Pain Medicine | Admitting: Pain Medicine

## 2020-03-26 ENCOUNTER — Other Ambulatory Visit: Payer: Self-pay

## 2020-03-26 DIAGNOSIS — M792 Neuralgia and neuritis, unspecified: Secondary | ICD-10-CM | POA: Diagnosis not present

## 2020-03-26 DIAGNOSIS — G8929 Other chronic pain: Secondary | ICD-10-CM

## 2020-03-26 DIAGNOSIS — M545 Low back pain, unspecified: Secondary | ICD-10-CM

## 2020-03-26 DIAGNOSIS — G894 Chronic pain syndrome: Secondary | ICD-10-CM | POA: Diagnosis not present

## 2020-03-26 DIAGNOSIS — M7918 Myalgia, other site: Secondary | ICD-10-CM

## 2020-03-26 MED ORDER — TIZANIDINE HCL 4 MG PO TABS: 4.0000 mg | ORAL_TABLET | Freq: Two times a day (BID) | ORAL | 2 refills | Status: DC | PRN

## 2020-03-26 MED ORDER — OXYCODONE HCL 5 MG PO TABS: 5.0000 mg | ORAL_TABLET | Freq: Three times a day (TID) | ORAL | 0 refills | Status: DC | PRN

## 2020-03-26 MED ORDER — GABAPENTIN 400 MG PO CAPS: 400.0000 mg | ORAL_CAPSULE | Freq: Every day | ORAL | 5 refills | Status: DC

## 2020-04-10 DIAGNOSIS — Z862 Personal history of diseases of the blood and blood-forming organs and certain disorders involving the immune mechanism: Secondary | ICD-10-CM | POA: Insufficient documentation

## 2020-04-10 DIAGNOSIS — K7460 Unspecified cirrhosis of liver: Secondary | ICD-10-CM | POA: Insufficient documentation

## 2020-04-25 ENCOUNTER — Telehealth: Payer: Medicare Other | Admitting: Pain Medicine

## 2020-06-18 ENCOUNTER — Other Ambulatory Visit: Payer: Self-pay

## 2020-06-18 ENCOUNTER — Ambulatory Visit: Payer: Medicare Other | Attending: Pain Medicine | Admitting: Pain Medicine

## 2020-06-18 DIAGNOSIS — M542 Cervicalgia: Secondary | ICD-10-CM

## 2020-06-18 DIAGNOSIS — G8929 Other chronic pain: Secondary | ICD-10-CM

## 2020-06-18 DIAGNOSIS — G894 Chronic pain syndrome: Secondary | ICD-10-CM

## 2020-06-18 DIAGNOSIS — Z79899 Other long term (current) drug therapy: Secondary | ICD-10-CM | POA: Diagnosis not present

## 2020-06-18 DIAGNOSIS — M549 Dorsalgia, unspecified: Secondary | ICD-10-CM

## 2020-06-18 DIAGNOSIS — M545 Low back pain: Secondary | ICD-10-CM | POA: Diagnosis not present

## 2020-06-18 MED ORDER — OXYCODONE HCL 5 MG PO TABS: 5.0000 mg | ORAL_TABLET | Freq: Three times a day (TID) | ORAL | 0 refills | Status: DC | PRN

## 2020-06-18 NOTE — Progress Notes (Signed)
Patient: Amber Acosta  Service Category: E/M  Provider: Gaspar Cola, MD  DOB: 1951-06-07  DOS: 06/18/2020  Location: Office  MRN: [id number]  Setting: Ambulatory outpatient  Referring Provider: Sharyne Peach, MD  Type: Established Patient  Specialty: Interventional Pain Management  PCP: Sharyne Peach, MD  Location: Remote location  Delivery: TeleHealth     Virtual Encounter - Pain Management PROVIDER NOTE: Information contained herein reflects review and annotations entered in association with encounter. Interpretation of such information and data should be left to medically-trained personnel. Information provided to patient can be located elsewhere in the medical record under "Patient Instructions". Document created using STT-dictation technology, any transcriptional errors that may result from process are unintentional.    Contact & Pharmacy Preferred: [phone] Home: [phone] (home) Mobile: [phone] (mobile) E-mail: [email or web address]  [facility] [address] ([address]), [place] - [place] ([place]) [place] [address] Phone: [phone] Fax: [phone]   Pre-screening  Amber Acosta offered "in-person" vs "virtual" encounter. She indicated preferring virtual for this encounter.   Reason COVID-19*  Social distancing based on CDC and AMA recommendations.   I contacted Amber Acosta on 06/18/2020 via telephone.      I clearly identified myself as 67/12/2019, MD. I verified that I was speaking with the correct person using two identifiers (Name: Amber Acosta, and date of birth: [birth year]).  Consent I sought verbal advanced consent from 67/08/1951 for virtual visit interactions. I informed Amber Acosta of possible security and privacy concerns, risks, and limitations associated with providing "not-in-person" medical evaluation and management services. I also informed Amber Acosta of the availability of "in-person" appointments.  Finally, I informed her that there would be a charge for the virtual visit and that she could be  personally, fully or partially, financially responsible for it. Amber Acosta expressed understanding and agreed to proceed.   Historic Elements   Amber Acosta is a 69 y.o. year old, female patient evaluated today after her last contact with our practice on 02/26/2020. Amber Acosta  has a past medical history of Arthritis, Cervical spondylosis without myelopathy (09/21/2015), Chronic pain, Chronic pain associated with significant psychosocial dysfunction (08/28/2013), Depression, Diabetes mellitus without complication ([place]), Displacement of cervical intervertebral disc without myelopathy (09/05/2015), Fibromyalgia, GERD (gastroesophageal reflux disease), Hypertension, Kidney disease, chronic, stage III (GFR 30-59 ml/min), Sarcoid, and Thrombopenia (Amber Acosta). She also  has a past surgical history that includes neck fusion; Breast surgery; Back surgery; Knee arthroscopy; Fracture surgery; Hand surgery; Abdominal hysterectomy; and Cyst excision. Amber Acosta has a current medication list which includes the following prescription(s): advair diskus, albuterol, amlodipine, vitamin d3, cyanocobalamin, doxycycline, fluoxetine, furosemide, gabapentin, hydralazine, insulin nph human, insulin regular, klor-con 10, liraglutide, losartan, lovastatin, metoprolol succinate, nitroglycerin, omeprazole, [START ON 06/26/2020] oxycodone, [START ON 07/26/2020] oxycodone, [START ON 08/25/2020] oxycodone, tizanidine, and trazodone. She  reports that she has quit smoking. She has quit using smokeless tobacco. She reports that she does not drink alcohol and does not use drugs. Amber Acosta is allergic to aspirin, ciprofloxacin, etanercept, metformin, methotrexate, mushroom extract complex, other, darvon [propoxyphene], minocycline, and cobalamin combinations.   HPI  Today, she is being contacted for medication management.  The patient was scheduled today to  come in for a face-to-face visit, but she called to indicate that her car would not start and requested to visit to be changed to virtual visit.  Pharmacotherapy Assessment  Analgesic: Oxycodone IR 5 mg, 1 tab PO q 8 hrs (15 mg/day  of oxycodone) MME/day:22.5 mg/day.   Monitoring: [place] PMP: PDMP reviewed during this encounter.       Pharmacotherapy: No side-effects or adverse reactions reported. Compliance: No problems identified. Effectiveness: Clinically acceptable. Plan: Refer to "POC".  UDS:  Summary  Date Value Ref Range Status  02/08/2019 FINAL  Final    Comment:    ==================================================================== TOXASSURE SELECT 13 (MW) ==================================================================== Test                             Result       Flag       Units Drug Present and Declared for Prescription Verification   Oxycodone                      1510         EXPECTED   ng/mg creat   Noroxycodone                   1962         EXPECTED   ng/mg creat    Sources of oxycodone include scheduled prescription medications.    Noroxycodone is an expected metabolite of oxycodone. ==================================================================== Test                      Result    Flag   Units      Ref Range   Creatinine              82               mg/dL      >=30 ==================================================================== Declared Medications:  The flagging and interpretation on this report are based on the  following declared medications.  Unexpected results may arise from  inaccuracies in the declared medications.  **Note: The testing scope of this panel includes these medications:  Oxycodone  **Note: The testing scope of this panel does not include following  reported medications:  Albuterol  Amlodipine Besylate  Fluoxetine  Fluticasone  Furosemide  Gabapentin  Hydralazine  Insulin  Insulin (Humulin)  Losartan (Losartan  Potassium)  Lovastatin  Metoprolol  Nitroglycerin  Omeprazole  Potassium  Ranitidine  Salmeterol  Trazodone ==================================================================== For clinical consultation, please call [phone]. ====================================================================     Laboratory Chemistry Profile   Renal Lab Results  Component Value Date   BUN 14 03/26/2016   CREATININE 1.28 (H) 03/26/2016   GFRAA 50 (L) 03/26/2016   GFRNONAA 44 (L) 03/26/2016     Hepatic Lab Results  Component Value Date   AST 24 03/26/2016   ALT 20 03/26/2016   ALBUMIN 4.0 03/26/2016   ALKPHOS 70 03/26/2016     Electrolytes Lab Results  Component Value Date   NA 136 03/26/2016   K 3.8 03/26/2016   CL 100 (L) 03/26/2016   CALCIUM 8.5 (L) 03/26/2016   MG 2.1 06/22/2017     Bone Lab Results  Component Value Date   VD25OH 20.2 (L) 03/26/2016     Inflammation (CRP: Acute Phase) (ESR: Chronic Phase) Lab Results  Component Value Date   CRP 2.1 (H) 03/26/2016   ESRSEDRATE 52 (H) 03/26/2016       Note: Above Lab results reviewed.   Imaging  DG PAIN CLINIC C-ARM 1-60 MIN NO REPORT Fluoro was used, but no Radiologist interpretation will be provided.  Please refer to "NOTES" tab for provider progress note.  Assessment  The primary encounter diagnosis was  Chronic pain syndrome. Diagnoses of Chronic low back pain (Primary Area of Pain) (Bilateral) (R>L), Chronic neck pain (Secondary area of Pain) (Bilateral) (L>R), and Pharmacologic therapy were also pertinent to this visit.  Plan of Care  Problem-specific:  No problem-specific Assessment & Plan notes found for this encounter.  Amber Acosta has a current medication list which includes the following long-term medication(s): advair diskus, amlodipine, fluoxetine, furosemide, gabapentin, hydralazine, insulin nph human, insulin regular, klor-con 10, liraglutide, losartan, lovastatin, nitroglycerin,  omeprazole, [START ON 06/26/2020] oxycodone, [START ON 07/26/2020] oxycodone, [START ON 08/25/2020] oxycodone, tizanidine, and trazodone.  Pharmacotherapy (Medications Ordered): Meds ordered this encounter  Medications  . oxyCODONE (OXY IR/ROXICODONE) 5 MG immediate release tablet    Sig: Take 1 tablet (5 mg total) by mouth every 8 (eight) hours as needed for severe pain. Must last 30 days    Dispense:  90 tablet    Refill:  0    Chronic Pain: STOP Act (Not applicable) Fill 1 day early if closed on refill date. Do not fill until: 06/26/2020. To last until: 07/26/2020. Avoid benzodiazepines within 8 hours of opioids  . oxyCODONE (OXY IR/ROXICODONE) 5 MG immediate release tablet    Sig: Take 1 tablet (5 mg total) by mouth every 8 (eight) hours as needed for severe pain. Must last 30 days    Dispense:  90 tablet    Refill:  0    Chronic Pain: STOP Act (Not applicable) Fill 1 day early if closed on refill date. Do not fill until: 07/26/2020. To last until: 08/25/2020. Avoid benzodiazepines within 8 hours of opioids  . oxyCODONE (OXY IR/ROXICODONE) 5 MG immediate release tablet    Sig: Take 1 tablet (5 mg total) by mouth every 8 (eight) hours as needed for severe pain. Must last 30 days    Dispense:  90 tablet    Refill:  0    Chronic Pain: STOP Act (Not applicable) Fill 1 day early if closed on refill date. Do not fill until: 08/25/2020. To last until: 09/24/2020. Avoid benzodiazepines within 8 hours of opioids   Orders:  Orders Placed This Encounter  Procedures  . ToxASSURE Select 13 (MW), Urine    Volume: 30 ml(s). Minimum 3 ml of urine is needed. Document temperature of fresh sample. Indications: Long term (current) use of opiate analgesic [phone])    Order Specific Question:   Release to patient    Answer:   Immediate   Follow-up plan:   Return in about 14 weeks (around 09/24/2020) for F2F(20-min), MM (never on procedure day).      Interventional management options:  Considering: NOTE:  NO RFA(Poor candidate due to delayed sensory perceptionand intra-procedural non-compliance.) No additional procedures planned at this time.   Prior & Palliative treatment(s): Palliative bilateral lumbar facet block #3(NO RFA)(for low back pain) Palliative left CESI #3  Palliative right CESI #2  Palliative left-sided C2 + TON #2  Palliativeleft cervical facet block(NO RFA)(for left-sided neck pain)  Palliative left cervical facet RFA #3 (last done 05/04/2018) (not to be repeated due to delayed sensory perceptionand intra-procedural non-compliance.)(Poor candidate) Palliative left GONB #7(NORFA) (for left-sided headaches) Palliative right GONB #2       Recent Visits Date Type Provider Dept  03/26/20 Telemedicine Milinda Pointer, MD [facility]  Showing recent visits within past 90 days and meeting all other requirements Today's Visits Date Type Provider Dept  06/18/20 Telemedicine Milinda Pointer, MD [facility]  Showing today's visits and meeting all other requirements Future  Appointments No visits were found meeting these conditions. Showing future appointments within next 90 days and meeting all other requirements  I discussed the assessment and treatment plan with the patient. The patient was provided an opportunity to ask questions and all were answered. The patient agreed with the plan and demonstrated an understanding of the instructions.  Patient advised to call back or seek an in-person evaluation if the symptoms or condition worsens.  Duration of encounter: 12 minutes.  Note by: Gaspar Cola, MD Date: 06/18/2020; Time: 2:44 PM

## 2020-06-21 ENCOUNTER — Ambulatory Visit: Payer: Medicare Other | Admitting: Pain Medicine

## 2020-06-29 LAB — TOXASSURE SELECT 13 (MW), URINE

## 2020-07-03 ENCOUNTER — Other Ambulatory Visit: Payer: Self-pay | Admitting: Pain Medicine

## 2020-07-03 ENCOUNTER — Telehealth: Payer: Self-pay

## 2020-07-03 DIAGNOSIS — M7918 Myalgia, other site: Secondary | ICD-10-CM

## 2020-07-03 DIAGNOSIS — G8929 Other chronic pain: Secondary | ICD-10-CM

## 2020-07-03 MED ORDER — TIZANIDINE HCL 4 MG PO TABS: 4.0000 mg | ORAL_TABLET | Freq: Two times a day (BID) | ORAL | 2 refills | Status: DC | PRN

## 2020-07-03 NOTE — Telephone Encounter (Signed)
Her muscle relaxer script didn't go thru. Please send.

## 2020-07-03 NOTE — Progress Notes (Unsigned)
Another issue with the prescriptions and the pharmacy where the pharmacy says that the prescription did not go through.

## 2020-07-03 NOTE — Telephone Encounter (Signed)
I called patient to ask her about Zanaflex, [facility] they do not have an Rx of this.  Was finally able to get in touch with [facility] and the Rx that was sent in on 03/26/20 that was not to fill until 05/27/20 was filled on the written date and has been filled twice more 05/02/20, 06/02/20 leaving no more refills.  I told patient I would send a message to Dr Laban Emperor and see what he says.    Message sent to Dr Laban Emperor and I am waiting for a reply.

## 2020-07-04 NOTE — Telephone Encounter (Signed)
Called patient and conveyed message from Dr Laban Emperor that he did send in for Zanaflex to last until 10/01/20 to [address].  He also included in his note that he sent a letter to PCP requesting that they take over zanaflex as well as gabapentin for future fills.  Patient verbalizes u/o information and is seeing PCP in the next couple of weeks and will discuss at that time.

## 2020-09-24 ENCOUNTER — Ambulatory Visit: Payer: Medicare Other | Attending: Pain Medicine | Admitting: Pain Medicine

## 2020-09-24 ENCOUNTER — Other Ambulatory Visit: Payer: Self-pay

## 2020-09-24 ENCOUNTER — Encounter: Payer: Self-pay | Admitting: Pain Medicine

## 2020-09-24 DIAGNOSIS — M545 Low back pain, unspecified: Secondary | ICD-10-CM

## 2020-09-24 DIAGNOSIS — M549 Dorsalgia, unspecified: Secondary | ICD-10-CM

## 2020-09-24 DIAGNOSIS — M7918 Myalgia, other site: Secondary | ICD-10-CM

## 2020-09-24 DIAGNOSIS — Z79899 Other long term (current) drug therapy: Secondary | ICD-10-CM | POA: Diagnosis not present

## 2020-09-24 DIAGNOSIS — M792 Neuralgia and neuritis, unspecified: Secondary | ICD-10-CM

## 2020-09-24 DIAGNOSIS — G894 Chronic pain syndrome: Secondary | ICD-10-CM

## 2020-09-24 DIAGNOSIS — M542 Cervicalgia: Secondary | ICD-10-CM

## 2020-09-24 DIAGNOSIS — G8929 Other chronic pain: Secondary | ICD-10-CM

## 2020-09-24 MED ORDER — OXYCODONE HCL 5 MG PO TABS: 5.0000 mg | ORAL_TABLET | Freq: Three times a day (TID) | ORAL | 0 refills | Status: DC | PRN

## 2020-09-24 MED ORDER — TIZANIDINE HCL 4 MG PO TABS: 4.0000 mg | ORAL_TABLET | Freq: Two times a day (BID) | ORAL | 2 refills | Status: AC | PRN

## 2020-09-24 NOTE — Patient Instructions (Signed)
____________________________________________________________________________________________  CBD (cannabidiol) WARNING  Applicable to: All individuals currently taking or considering taking CBD (cannabidiol) and, more important, all patients taking opioid analgesic controlled substances (pain medication). (Example: oxycodone; oxymorphone; hydrocodone; hydromorphone; morphine; methadone; tramadol; tapentadol; fentanyl; buprenorphine; butorphanol; dextromethorphan; meperidine; codeine; etc.)  Legal status: CBD remains a Schedule I drug prohibited for any use. CBD is illegal with one exception. In the [place], CBD has a limited Food and Drug Administration (FDA) approval for the treatment of two specific types of epilepsy disorders. Only one CBD product has been approved by the FDA for this purpose: "Epidiolex". FDA is aware that some companies are marketing products containing cannabis and cannabis-derived compounds in ways that violate the Federal Food, Drug and Cosmetic Act (FD&C Act) and that may put the health and safety of consumers at risk. The FDA, a Federal agency, has not enforced the CBD status since 2018.   Legality: Some manufacturers ship CBD products nationally, which is illegal. Often such products are sold online and are therefore available throughout the country. CBD is openly sold in head shops and health food stores in some states where such sales have not been explicitly legalized. Selling unapproved products with unsubstantiated therapeutic claims is not only a violation of the law, but also can put patients at risk, as these products have not been proven to be safe or effective. Federal illegality makes it difficult to conduct research on CBD.  Reference: "FDA Regulation of Cannabis and Cannabis-Derived Products, Including Cannabidiol (CBD)" -  https://www.fda.gov/news-events/public-health-focus/fda-regulation-cannabis-and-cannabis-derived-products-including-cannabidiol-cbd  Warning: CBD is not FDA approved and has not undergo the same manufacturing controls as prescription drugs.  This means that the purity and safety of available CBD may be questionable. Most of the time, despite manufacturer's claims, it is contaminated with THC (delta-9-tetrahydrocannabinol - the chemical in marijuana responsible for the "HIGH").  When this is the case, the THC contaminant will trigger a positive urine drug screen (UDS) test for Marijuana (carboxy-THC). Because a positive UDS for any illicit substance is a violation of our medication agreement, your opioid analgesics (pain medicine) may be permanently discontinued.  MORE ABOUT CBD  General Information: CBD  is a derivative of the Marijuana (cannabis sativa) plant discovered in 1940. It is one of the 113 identified substances found in Marijuana. It accounts for up to 40% of the plant's extract. As of 2018, preliminary clinical studies on CBD included research for the treatment of anxiety, movement disorders, and pain. CBD is available and consumed in multiple forms, including inhalation of smoke or vapor, as an aerosol spray, and by mouth. It may be supplied as an oil containing CBD, capsules, dried cannabis, or as a liquid solution. CBD is thought not to be as psychoactive as THC (delta-9-tetrahydrocannabinol - the chemical in marijuana responsible for the "HIGH"). Studies suggest that CBD may interact with different biological target receptors in the body, including cannabinoid and other neurotransmitter receptors. As of 2018 the mechanism of action for its biological effects has not been determined.  Side-effects  Adverse reactions: Dry mouth, diarrhea, decreased appetite, fatigue, drowsiness, malaise, weakness, sleep disturbances, and others.  Drug interactions: CBC may interact with other medications  such as blood-thinners. (Last update: 07/07/2020) ____________________________________________________________________________________________   ____________________________________________________________________________________________  Medication Rules  Purpose: To inform patients, and their family members, of our rules and regulations.  Applies to: All patients receiving prescriptions (written or electronic).  Pharmacy of record: Pharmacy where electronic prescriptions will be sent. If written prescriptions are taken to a different pharmacy, please inform   the nursing staff. The pharmacy listed in the electronic medical record should be the one where you would like electronic prescriptions to be sent.  Electronic prescriptions: In compliance with the [place] Strengthen Opioid Misuse Prevention (STOP) Act of 2017 (Session Law 2017-74/H243), effective December 01, 2018, all controlled substances must be electronically prescribed. Calling prescriptions to the pharmacy will cease to exist.  Prescription refills: Only during scheduled appointments. Applies to all prescriptions.  NOTE: The following applies primarily to controlled substances (Opioid* Pain Medications).   Type of encounter (visit): For patients receiving controlled substances, face-to-face visits are required. (Not an option or up to the patient.)  Patient's responsibilities: 1. Pain Pills: Bring all pain pills to every appointment (except for procedure appointments). 2. Pill Bottles: Bring pills in original pharmacy bottle. Always bring the newest bottle. Bring bottle, even if empty. 3. Medication refills: You are responsible for knowing and keeping track of what medications you take and those you need refilled. The day before your appointment: write a list of all prescriptions that need to be refilled. The day of the appointment: give the list to the admitting nurse. Prescriptions will be written only during  appointments. No prescriptions will be written on procedure days. If you forget a medication: it will not be "Called in", "Faxed", or "electronically sent". You will need to get another appointment to get these prescribed. No early refills. Do not call asking to have your prescription filled early. 4. Prescription Accuracy: You are responsible for carefully inspecting your prescriptions before leaving our office. Have the discharge nurse carefully go over each prescription with you, before taking them home. Make sure that your name is accurately spelled, that your address is correct. Check the name and dose of your medication to make sure it is accurate. Check the number of pills, and the written instructions to make sure they are clear and accurate. Make sure that you are given enough medication to last until your next medication refill appointment. 5. Taking Medication: Take medication as prescribed. When it comes to controlled substances, taking less pills or less frequently than prescribed is permitted and encouraged. Never take more pills than instructed. Never take medication more frequently than prescribed.  6. Inform other Doctors: Always inform, all of your healthcare providers, of all the medications you take. 7. Pain Medication from other Providers: You are not allowed to accept any additional pain medication from any other Doctor or Healthcare provider. There are two exceptions to this rule. (see below) In the event that you require additional pain medication, you are responsible for notifying us, as stated below. 8. Medication Agreement: You are responsible for carefully reading and following our Medication Agreement. This must be signed before receiving any prescriptions from our practice. Safely store a copy of your signed Agreement. Violations to the Agreement will result in no further prescriptions. (Additional copies of our Medication Agreement are available upon request.) 9. Laws, Rules,  & Regulations: All patients are expected to follow all Federal and State Laws, Statutes, Rules, & Regulations. Ignorance of the Laws does not constitute a valid excuse.  10. Illegal drugs and Controlled Substances: The use of illegal substances (including, but not limited to marijuana and its derivatives) and/or the illegal use of any controlled substances is strictly prohibited. Violation of this rule may result in the immediate and permanent discontinuation of any and all prescriptions being written by our practice. The use of any illegal substances is prohibited. 11. Adopted CDC guidelines & recommendations: Target dosing   levels will be at or below 60 MME/day. Use of benzodiazepines** is not recommended.  Exceptions: There are only two exceptions to the rule of not receiving pain medications from other Healthcare Providers. 1. Exception #1 (Emergencies): In the event of an emergency (i.e.: accident requiring emergency care), you are allowed to receive additional pain medication. However, you are responsible for: As soon as you are able, call our office [phone], at any time of the day or night, and leave a message stating your name, the date and nature of the emergency, and the name and dose of the medication prescribed. In the event that your call is answered by a member of our staff, make sure to document and save the date, time, and the name of the person that took your information.  2. Exception #2 (Planned Surgery): In the event that you are scheduled by another doctor or dentist to have any type of surgery or procedure, you are allowed (for a period no longer than 30 days), to receive additional pain medication, for the acute post-op pain. However, in this case, you are responsible for picking up a copy of our "Post-op Pain Management for Surgeons" handout, and giving it to your surgeon or dentist. This document is available at our office, and does not require an appointment to obtain it. Simply  go to our office during business hours (Monday-Thursday from 8:00 AM to 4:00 PM) (Friday 8:00 AM to 12:00 Noon) or if you have a scheduled appointment with us, prior to your surgery, and ask for it by name. In addition, you are responsible for: calling our office [phone], at any time of the day or night, and leaving a message stating your name, name of your surgeon, type of surgery, and date of procedure or surgery. Failure to comply with your responsibilities may result in termination of therapy involving the controlled substances.  *Opioid medications include: morphine, codeine, oxycodone, oxymorphone, hydrocodone, hydromorphone, meperidine, tramadol, tapentadol, buprenorphine, fentanyl, methadone. **Benzodiazepine medications include: diazepam (Valium), alprazolam (Xanax), clonazepam (Klonopine), lorazepam (Ativan), clorazepate (Tranxene), chlordiazepoxide (Librium), estazolam (Prosom), oxazepam (Serax), temazepam (Restoril), triazolam (Halcion) (Last updated: 08/07/2020) ____________________________________________________________________________________________   ____________________________________________________________________________________________  Medication Recommendations and Reminders  Applies to: All patients receiving prescriptions (written and/or electronic).  Medication Rules & Regulations: These rules and regulations exist for your safety and that of others. They are not flexible and neither are we. Dismissing or ignoring them will be considered "non-compliance" with medication therapy, resulting in complete and irreversible termination of such therapy. (See document titled "Medication Rules" for more details.) In all conscience, because of safety reasons, we cannot continue providing a therapy where the patient does not follow instructions.  Pharmacy of record:   Definition: This is the pharmacy where your electronic prescriptions will be sent.   We do not endorse any  particular pharmacy, however, we have experienced problems with [facility] not securing enough medication supply for the community.  We do not restrict you in your choice of pharmacy. However, once we write for your prescriptions, we will NOT be re-sending more prescriptions to fix restricted supply problems created by your pharmacy, or your insurance.   The pharmacy listed in the electronic medical record should be the one where you want electronic prescriptions to be sent.  If you choose to change pharmacy, simply notify our nursing staff.  Recommendations:  Keep all of your pain medications in a safe place, under lock and key, even if you live alone. We will NOT replace lost, stolen, or   damaged medication.  After you fill your prescription, take 1 week's worth of pills and put them away in a safe place. You should keep a separate, properly labeled bottle for this purpose. The remainder should be kept in the original bottle. Use this as your primary supply, until it runs out. Once it's gone, then you know that you have 1 week's worth of medicine, and it is time to come in for a prescription refill. If you do this correctly, it is unlikely that you will ever run out of medicine.  To make sure that the above recommendation works, it is very important that you make sure your medication refill appointments are scheduled at least 1 week before you run out of medicine. To do this in an effective manner, make sure that you do not leave the office without scheduling your next medication management appointment. Always ask the nursing staff to show you in your prescription , when your medication will be running out. Then arrange for the receptionist to get you a return appointment, at least 7 days before you run out of medicine. Do not wait until you have 1 or 2 pills left, to come in. This is very poor planning and does not take into consideration that we may need to cancel appointments due to bad weather,  sickness, or emergencies affecting our staff.  DO NOT ACCEPT A "Partial Fill": If for any reason your pharmacy does not have enough pills/tablets to completely fill or refill your prescription, do not allow for a "partial fill". The law allows the pharmacy to complete that prescription within 72 hours, without requiring a new prescription. If they do not fill the rest of your prescription within those 72 hours, you will need a separate prescription to fill the remaining amount, which we will NOT provide. If the reason for the partial fill is your insurance, you will need to talk to the pharmacist about payment alternatives for the remaining tablets, but again, DO NOT ACCEPT A PARTIAL FILL, unless you can trust your pharmacist to obtain the remainder of the pills within 72 hours.  Prescription refills and/or changes in medication(s):   Prescription refills, and/or changes in dose or medication, will be conducted only during scheduled medication management appointments. (Applies to both, written and electronic prescriptions.)  No refills on procedure days. No medication will be changed or started on procedure days. No changes, adjustments, and/or refills will be conducted on a procedure day. Doing so will interfere with the diagnostic portion of the procedure.  No phone refills. No medications will be "called into the pharmacy".  No Fax refills.  No weekend refills.  No Holliday refills.  No after hours refills.  Remember:  Business hours are:  Monday to Thursday 8:00 AM to 4:00 PM Provider's Schedule: Aanya Haynes, MD - Appointments are:  Medication management: Monday and Wednesday 8:00 AM to 4:00 PM Procedure day: Tuesday and Thursday 7:30 AM to 4:00 PM Bilal Lateef, MD - Appointments are:  Medication management: Tuesday and Thursday 8:00 AM to 4:00 PM Procedure day: Monday and Wednesday 7:30 AM to 4:00 PM (Last update:  06/20/2020) ____________________________________________________________________________________________    

## 2020-09-24 NOTE — Progress Notes (Signed)
Patient: Amber Acosta  Service Category: E/M  Provider: Gaspar Cola, MD  DOB: 05/19/1951  DOS: 09/24/2020  Location: Office  MRN: [id number]  Setting: Ambulatory outpatient  Referring Provider: Sharyne Peach, MD  Type: Established Patient  Specialty: Interventional Pain Management  PCP: Sharyne Peach, MD  Location: Remote location  Delivery: TeleHealth     Virtual Encounter - Pain Management PROVIDER NOTE: Information contained herein reflects review and annotations entered in association with encounter. Interpretation of such information and data should be left to medically-trained personnel. Information provided to patient can be located elsewhere in the medical record under "Patient Instructions". Document created using STT-dictation technology, any transcriptional errors that may result from process are unintentional.    Contact & Pharmacy Preferred: [phone] Home: [phone] (home) Mobile: [phone] (mobile) E-mail: [email or web address]  [place] ([address]), [place] [address] [address] Phone: [phone] Fax: [phone]   Pre-screening  Ms. Donahoo offered "in-person" vs "virtual" encounter. She indicated preferring virtual for this encounter.   Reason COVID-19*  Social distancing based on CDC and AMA recommendations.   I contacted Triva Kronick on 09/24/2020 via telephone.      I clearly identified myself as Gaspar Cola, MD. I verified that I was speaking with the correct person using two identifiers (Name: Kinlie Janice, and date of birth: 1951/11/07).  Consent I sought verbal advanced consent from Idell Pickles for virtual visit interactions. I informed Ms. Colombo of possible security and privacy concerns, risks, and limitations associated with providing "not-in-person" medical evaluation and management services. I also informed Ms. Ruane of the availability of "in-person"  appointments. Finally, I informed her that there would be a charge for the virtual visit and that she could be  personally, fully or partially, financially responsible for it. Ms. Traeger expressed understanding and agreed to proceed.   Historic Elements   Ms. Jolea Dolle is a 69 y.o. year old, female patient evaluated today after our last contact on 07/03/2020. Ms. Haberl  has a past medical history of Arthritis, Cervical spondylosis without myelopathy (09/21/2015), Chronic pain, Chronic pain associated with significant psychosocial dysfunction (08/28/2013), Depression, Diabetes mellitus without complication ([place]), Displacement of cervical intervertebral disc without myelopathy (09/05/2015), Fibromyalgia, GERD (gastroesophageal reflux disease), Hypertension, Kidney disease, chronic, stage III (GFR 30-59 ml/min) ([place]), Sarcoid, and Thrombopenia ([place]). She also  has a past surgical history that includes neck fusion; Breast surgery; Back surgery; Knee arthroscopy; Fracture surgery; Hand surgery; Abdominal hysterectomy; and Cyst excision. Ms. Prehn has a current medication list which includes the following prescription(s): advair diskus, albuterol, amlodipine, vitamin d3, cyanocobalamin, doxycycline, fluoxetine, furosemide, gabapentin, insulin regular, klor-con 10, liraglutide, losartan, lovastatin, metoprolol succinate, nitroglycerin, nortriptyline, omeprazole, trazodone, hydralazine, insulin nph human, oxycodone, and tizanidine. She  reports that she has quit smoking. She has quit using smokeless tobacco. She reports that she does not drink alcohol and does not use drugs. Ms. Whetsel is allergic to aspirin, ciprofloxacin, etanercept, metformin, methotrexate, mushroom extract complex, other, darvon [propoxyphene], minocycline, and cobalamin combinations.   HPI  Today, she is being contacted for medication management. The patient's husband just tested positive for COVID-19. Therefore we switched her face-to-face visit to  a virtual visit since she is in quarantine.  The patient indicates doing well with the current medication regimen. No adverse reactions or side effects reported to the medications.  The patient has also notified us that her PCP has taken over her gabapentin management and she has  been titrating this down with the intent to stop it.  Most likely this is secondary to patient's poor kidney function.  In any case, our intent is to continue managing only the opioid analgesics in our patients and therefore we will be transferring all of the nonopioids to the PCP.  Today we wrote our last tizanidine (Zanaflex) prescription, so that the patient will have enough until she can set up a follow-up appointment with her PCP.  Pharmacotherapy Assessment  Analgesic: Oxycodone IR 5 mg, 1 tab PO q 8 hrs (15 mg/day of oxycodone) MME/day:22.5 mg/day.   Monitoring: [place] PMP: PDMP reviewed during this encounter.       Pharmacotherapy: No side-effects or adverse reactions reported. Compliance: No problems identified. Effectiveness: Clinically acceptable. Plan: Refer to "POC".  UDS:  Summary  Date Value Ref Range Status  06/26/2020 Note  Final    Comment:    ==================================================================== [employer] [facility] 13 (MW) ==================================================================== Test                             Result       Flag       Units  Drug Present and Declared for Prescription Verification   Oxycodone                      1764         EXPECTED   ng/mg creat   Noroxycodone                   1664         EXPECTED   ng/mg creat    Sources of oxycodone include scheduled prescription medications.    Noroxycodone is an expected metabolite of oxycodone.  ==================================================================== Test                      Result    Flag   Units      Ref Range   Creatinine              107              mg/dL       >=20 ==================================================================== Declared Medications:  The flagging and interpretation on this report are based on the  following declared medications.  Unexpected results may arise from  inaccuracies in the declared medications.   **Note: The testing scope of this panel includes these medications:   Oxycodone (Roxicodone)   **Note: The testing scope of this panel does not include the  following reported medications:   Albuterol  Amlodipine  Cholecalciferol  Cyanocobalamin  Doxycycline  Fluoxetine (Prozac)  Fluticasone (Advair)  Furosemide (Lasix)  Gabapentin  Hydralazine  Insulin (Novolin)  Liraglutide (Victoza)  Losartan (Cozaar)  Lovastatin (Mevacor)  Metoprolol  Nitroglycerin (Nitrostat)  Omeprazole  Potassium (Klor-Con)  Salmeterol (Advair)  Tizanidine  Trazodone ==================================================================== For clinical consultation, please call [phone]. ====================================================================     Laboratory Chemistry Profile   Renal Lab Results  Component Value Date   BUN 14 03/26/2016   CREATININE 1.28 (H) 03/26/2016   GFRAA 50 (L) 03/26/2016   GFRNONAA 44 (L) 03/26/2016     Hepatic Lab Results  Component Value Date   AST 24 03/26/2016   ALT 20 03/26/2016   ALBUMIN 4.0 03/26/2016   ALKPHOS 70 03/26/2016     Electrolytes Lab Results  Component Value Date   NA 136 03/26/2016   K 3.8 03/26/2016  CL 100 (L) 03/26/2016   CALCIUM 8.5 (L) 03/26/2016   MG 2.1 06/22/2017     Bone Lab Results  Component Value Date   VD25OH 20.2 (L) 03/26/2016     Inflammation (CRP: Acute Phase) (ESR: Chronic Phase) Lab Results  Component Value Date   CRP 2.1 (H) 03/26/2016   ESRSEDRATE 52 (H) 03/26/2016       Note: Above Lab results reviewed.  Imaging  DG PAIN CLINIC C-ARM 1-60 MIN NO REPORT Fluoro was used, but no Radiologist interpretation will be  provided.  Please refer to "NOTES" tab for provider progress note.  Assessment  The primary encounter diagnosis was Chronic pain syndrome. Diagnoses of Chronic low back pain (Primary Area of Pain) (Bilateral) (R>L), Chronic neck pain (Secondary area of Pain) (Bilateral) (L>R), Pharmacologic therapy, Myofascial pain, and Neurogenic pain were also pertinent to this visit.  Plan of Care  Problem-specific:  No problem-specific Assessment & Plan notes found for this encounter.  Ms. Alveta Quintela has a current medication list which includes the following long-term medication(s): advair diskus, amlodipine, fluoxetine, furosemide, gabapentin, insulin regular, klor-con 10, liraglutide, losartan, lovastatin, nitroglycerin, nortriptyline, omeprazole, trazodone, hydralazine, insulin nph human, oxycodone, and tizanidine.  Pharmacotherapy (Medications Ordered): Meds ordered this encounter  Medications  . oxyCODONE (OXY IR/ROXICODONE) 5 MG immediate release tablet    Sig: Take 1 tablet (5 mg total) by mouth every 8 (eight) hours as needed for severe pain. Must last 30 days    Dispense:  90 tablet    Refill:  0    Chronic Pain: STOP Act (Not applicable) Fill 1 day early if closed on refill date. Avoid benzodiazepines within 8 hours of opioids  . tiZANidine (ZANAFLEX) 4 MG tablet    Sig: Take 1 tablet (4 mg total) by mouth 2 (two) times daily as needed for muscle spasms.    Dispense:  60 tablet    Refill:  2    Fill one day early if pharmacy is closed on scheduled refill date. May substitute for generic if available.   Orders:  No orders of the defined types were placed in this encounter.  Follow-up plan:   Return in about 30 days (around 10/24/2020) for (F2F), (Med Mgmt).      Interventional management options:  Considering: NOTE: NO RFA(Poor candidate due to delayed sensory perceptionand intra-procedural non-compliance.) No additional procedures planned at this time.   Prior & Palliative  treatment(s): Palliative bilateral lumbar facet block #3(NO RFA)(for low back pain) Palliative left CESI #3  Palliative right CESI #2  Palliative left-sided C2 + TON #2  Palliativeleft cervical facet block(NO RFA)(for left-sided neck pain)  Palliative left cervical facet RFA #3 (last done 05/04/2018) (not to be repeated due to delayed sensory perceptionand intra-procedural non-compliance.)(Poor candidate) Palliative left GONB #7(NORFA) (for left-sided headaches) Palliative right GONB #2     Recent Visits No visits were found meeting these conditions. Showing recent visits within past 90 days and meeting all other requirements Today's Visits Date Type Provider Dept  09/24/20 Telemedicine Milinda Pointer, MD [facility]  Showing today's visits and meeting all other requirements Future Appointments No visits were found meeting these conditions. Showing future appointments within next 90 days and meeting all other requirements  I discussed the assessment and treatment plan with the patient. The patient was provided an opportunity to ask questions and all were answered. The patient agreed with the plan and demonstrated an understanding of the instructions.  Patient advised to call back or seek an  in-person evaluation if the symptoms or condition worsens.  Duration of encounter: 12 minutes.  Note by: Gaspar Cola, MD Date: 09/24/2020; Time: 9:44 AM

## 2020-10-22 NOTE — Progress Notes (Addendum)
PROVIDER NOTE: Information contained herein reflects review and annotations entered in association with encounter. Interpretation of such information and data should be left to medically-trained personnel. Information provided to patient can be located elsewhere in the medical record under "Patient Instructions". Document created using STT-dictation technology, any transcriptional errors that may result from process are unintentional.    Patient: Amber Acosta  Service Category: E/M  Provider: Gaspar Cola, MD  DOB: 1951/03/06  DOS: 10/24/2020  Specialty: Interventional Pain Management  MRN: [id number]  Setting: Ambulatory outpatient  PCP: Amber Peach, MD  Type: Established Patient    Referring Provider: Sharyne Peach, MD  Location: Office  Delivery: Face-to-face     HPI  Amber Acosta, a 69 y.o. year old female, is here today because of her Chronic pain syndrome [G89.4]. Amber Acosta primary complain today is Back Pain (upper), Neck Pain, and Shoulder Pain (bilateral ) Last encounter: My last encounter with her was on 07/03/2020. Pertinent problems: Amber Acosta has Cervical vertebral fusion (Uncomplicated C5 through C7 ACDF.); Fibromyalgia; Lumbar facet joint pain; Lumbar facet syndrome (Bilateral) (R>L); Inflammatory polyarthropathy ([name]); Chronic neck pain (Secondary area of Pain) (Bilateral) (L>R); Cervicogenic headache (Left); Cervical facet syndrome (Left); Spondylolisthesis of cervical region; Cervical spine ankylosis; Hx of cervical spine surgery; Myofascial pain; Neuropathy; Diabetic peripheral neuropathy ([name]); Meralgia paresthetica (Right); Cervical spondylosis; Chronic low back pain (Primary Area of Pain) (Bilateral) (R>L); Chronic pain syndrome; Chronic Occipital neuralgia (Left); Displacement of cervical intervertebral disc without myelopathy; Right arm weakness; Disturbance of skin sensation; 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 Pain Assessment: Severity of Chronic pain is reported as a 6 /10. Location: Back (see visit information) Upper, Left, Right/pain into neck and shoulders. once in a blue moon will go down the right arm. Onset: More than a month ago. Quality: Discomfort, Constant, Sharp. Timing: Constant. Modifying factor(s): nothing currently. Vitals:  height is $RemoveB'5\' 3"'TupyQWjP$  (1.6 m) and weight is 225 lb (102.1 kg). Her temporal temperature is 97 F (36.1 C) (abnormal). Her blood pressure is 138/64 and her pulse is 101 (abnormal). Her respiration is 16 and oxygen saturation is 95%.   Reason for encounter: medication management.  The patient indicates being unable to take the oxycodone secondary to itching.  This is rather rarel since she has been on it since at least October 2016.  She also refers not being able to tolerate the Benadryl that she has to take for the itching.  The patient refers that since her last treatment of the occipital region, she has not had any pain.  She was also started on amitriptyline at bedtime, which apparently did help.  At this point, she indicates that the itching from the oxycodone is such that she cannot sleep at night and she rather not take the medication.  She also indicates that her husband recently came off of his opioids and he is doing well.  According to her she has not taking any opioids in the last week.  Because of this, there is no need to taper it down.  Today we have collected her residual medication and we have destroyed it and follow her.  At this point we will be staying off of the opioid analgesics.  The patient was  instructed to give [place] a call if the pain  returns so as to consider repeating the interventional therapies.  RTCB: PRN Nonopioids transfered 09/24/2020: Tizanidine  Pharmacotherapy Assessment   Analgesic: No opioid analgesics prescribed by our practice.  Stopped 10/24/2020 secondary to side effects (severe pruritus).  On the 10/24/2020 visit she referred that she had been off of them for 1 week and felt she did not need them. MME/day:0 mg/day.   Monitoring: [place] PMP: PDMP reviewed during this encounter.       Pharmacotherapy: No side-effects or adverse reactions reported. Compliance: No problems identified. Effectiveness: Clinically acceptable.  Amber Billow, RN  10/24/2020  9:23 AM  Signed Nursing Pain Medication Assessment:  Safety precautions to be maintained throughout the outpatient stay will include: orient to surroundings, keep bed in low position, maintain call bell within reach at all times, provide assistance with transfer out of bed and ambulation.  Medication Inspection Compliance: Pill count conducted under aseptic conditions, in front of the patient. Neither the pills nor the bottle was removed from the patient's sight at any time. Once count was completed pills were immediately returned to the patient in their original bottle.  Medication: Oxycodone IR Pill/Patch Count: 68 of 90 pills remain Pill/Patch Appearance: Markings consistent with prescribed medication Bottle Appearance: Standard pharmacy container. Clearly labeled. Filled Date: 39 / 05 / 2021 Last Medication intake:  week or more ago.. causes severe itching.    68 tablets wasted in STericycle container,, witnessed by patient and Amber Liv RN    UDS:  Summary  Date Value Ref Range Status  06/26/2020 Note  Final    Comment:    ==================================================================== [employer] Select 13 (MW) ==================================================================== Test                             Result       Flag        Units  Drug Present and Declared for Prescription Verification   Oxycodone                      1764         EXPECTED   ng/mg creat   Noroxycodone                   1664         EXPECTED   ng/mg creat    Sources of oxycodone include scheduled prescription medications.    Noroxycodone is an expected metabolite of oxycodone.  ==================================================================== Test                      Result    Flag   Units      Ref Range   Creatinine              107              mg/dL      >=20 ==================================================================== Declared Medications:  The flagging and interpretation on this report are based on the  following declared medications.  Unexpected results may arise from  inaccuracies in the declared medications.   **Note: The testing scope of this panel includes these medications:   Oxycodone (Roxicodone)   **Note: The testing scope of this panel does not include the  following reported medications:   Albuterol  Amlodipine  Cholecalciferol  Cyanocobalamin  Doxycycline  Fluoxetine (Prozac)  Fluticasone (Advair)  Furosemide (Lasix)  Gabapentin  Hydralazine  Insulin (Novolin)  Liraglutide (Victoza)  Losartan (Cozaar)  Lovastatin (Mevacor)  Metoprolol  Nitroglycerin (Nitrostat)  Omeprazole  Potassium (Klor-Con)  Salmeterol (Advair)  Tizanidine  Trazodone ==================================================================== For clinical consultation, please call [phone]. ====================================================================      ROS  Constitutional: Denies any fever or chills Gastrointestinal: No reported hemesis, hematochezia, vomiting, or acute GI distress Musculoskeletal: Denies any acute onset joint swelling, redness, loss of ROM, or weakness Neurological: No reported episodes of acute onset apraxia, aphasia, dysarthria, agnosia, amnesia, paralysis, loss of coordination, or loss  of consciousness  Medication Review  Cyanocobalamin, Dulaglutide, FLUoxetine, Fluticasone-Salmeterol, Vitamin D3, albuterol, amLODipine, furosemide, hydrALAZINE, insulin NPH Human, insulin regular, losartan, lovastatin, metoprolol succinate, nitroGLYCERIN, nortriptyline, omeprazole, oxyCODONE, potassium chloride, and tiZANidine  History Review  Allergy: Ms. Hedeen is allergic to aspirin, ciprofloxacin, etanercept, metformin, methotrexate, mushroom extract complex, other, darvon [propoxyphene], minocycline, and cobalamin combinations. Drug: Ms. Scalia  reports no history of drug use. Alcohol:  reports no history of alcohol use. Tobacco:  reports that she has quit smoking. She has quit using smokeless tobacco. Social: Ms. Cannedy  reports that she has quit smoking. She has quit using smokeless tobacco. She reports that she does not drink alcohol and does not use drugs. Medical:  has a past medical history of Arthritis, Cervical spondylosis without myelopathy (09/21/2015), Chronic pain, Chronic pain associated with significant psychosocial dysfunction (08/28/2013), Depression, Diabetes mellitus without complication ([place]), Displacement of cervical intervertebral disc without myelopathy (09/05/2015), Fibromyalgia, GERD (gastroesophageal reflux disease), Hypertension, Kidney disease, chronic, stage III (GFR 30-59 ml/min) ([place]), Sarcoid, and Thrombopenia ([place]). Surgical: Ms. Speece  has a past surgical history that includes neck fusion; Breast surgery; Back surgery; Knee arthroscopy; Fracture surgery; Hand surgery; Abdominal hysterectomy; and Cyst excision. Family: family history includes Alcohol abuse in her brother; Alzheimer's disease in her mother; COPD in her father; Diabetes in her father; Heart disease in her father.  Laboratory Chemistry Profile   Renal Lab Results  Component Value Date   BUN 14 03/26/2016   CREATININE 1.28 (H) 03/26/2016   GFRAA 50 (L) 03/26/2016   GFRNONAA 44 (L) 03/26/2016      Hepatic Lab Results  Component Value Date   AST 24 03/26/2016   ALT 20 03/26/2016   ALBUMIN 4.0 03/26/2016   ALKPHOS 70 03/26/2016     Electrolytes Lab Results  Component Value Date   NA 136 03/26/2016   K 3.8 03/26/2016   CL 100 (L) 03/26/2016   CALCIUM 8.5 (L) 03/26/2016   MG 2.1 06/22/2017     Bone Lab Results  Component Value Date   VD25OH 20.2 (L) 03/26/2016     Inflammation (CRP: Acute Phase) (ESR: Chronic Phase) Lab Results  Component Value Date   CRP 2.1 (H) 03/26/2016   ESRSEDRATE 52 (H) 03/26/2016       Note: Above Lab results reviewed.  Recent Imaging Review  DG PAIN CLINIC C-ARM 1-60 MIN NO REPORT Fluoro was used, but no Radiologist interpretation will be provided.  Please refer to "NOTES" tab for provider progress note. Note: Reviewed        Physical Exam  General appearance: Well nourished, well developed, and well hydrated. In no apparent acute distress Mental status: Alert, oriented x 3 (person, place, & time)       Respiratory: No evidence of acute respiratory distress Eyes: PERLA Vitals: BP 138/64 (BP Location: Left Arm, Patient Position: Sitting, Cuff Size: Large)   Pulse (!) 101   Temp (!) 97 F (36.1 C) (Temporal)  Resp 16   Ht $R'5\' 3"'xb$  (1.6 m)   Wt 225 lb (102.1 kg)   SpO2 95%   BMI 39.86 kg/m  BMI: Estimated body mass index is 39.86 kg/m as calculated from the following:   Height as of this encounter: $RemoveBeforeD'5\' 3"'IwEXvLdqGuBCQQ$  (1.6 m).   Weight as of this encounter: 225 lb (102.1 kg). Ideal: Ideal body weight: 52.4 kg (115 lb 8.3 oz) Adjusted ideal body weight: 72.3 kg (159 lb 5 oz)  Assessment   Status Diagnosis  Controlled Controlled Controlled 1. Chronic pain syndrome   2. Chronic low back pain (Primary Area of Pain) (Bilateral) (R>L)   3. Chronic neck pain (Secondary area of Pain) (Bilateral) (L>R)   4. Pharmacologic therapy      Updated Problems: Problem  Long Term Prescription Opiate Use (Resolved)  Opiate use (22.5 MME/Day) (Resolved)   Long Term Current Use of Opiate Analgesic (Resolved)    Plan of Care  Problem-specific:  No problem-specific Assessment & Plan notes found for this encounter.  Ms. Lindsi Bayliss has a current medication list which includes the following long-term medication(s): advair diskus, amlodipine, fluoxetine, furosemide, insulin regular, klor-con 10, losartan, lovastatin, nitroglycerin, nortriptyline, omeprazole, oxycodone, tizanidine, hydralazine, and insulin nph human.  Pharmacotherapy (Medications Ordered): No orders of the defined types were placed in this encounter.  Orders:  No orders of the defined types were placed in this encounter.  Follow-up plan:   Return if symptoms worsen or fail to improve.      Interventional management options:  Considering: NOTE: NO RFA(Poor candidate due to delayed sensory perceptionand intra-procedural non-compliance.) No additional procedures planned at this time.   Prior & Palliative treatment(s): Palliative bilateral lumbar facet block #3(NO RFA)(for low back pain) Palliative left CESI #3  Palliative right CESI #2  Palliative left-sided C2 + TON #2  Palliativeleft cervical facet block(NO RFA)(for left-sided neck pain)  Palliative left cervical facet RFA #3 (last done 05/04/2018) (not to be repeated due to delayed sensory perceptionand intra-procedural non-compliance.)(Poor candidate) Palliative left GONB #7(NORFA) (for left-sided headaches) Palliative right GONB #2      Recent Visits Date Type Provider Dept  09/24/20 Telemedicine Milinda Pointer, MD [facility]  Showing recent visits within past 90 days and meeting all other requirements Today's Visits Date Type Provider Dept  10/24/20 Office Visit Milinda Pointer, MD [facility]  Showing today's visits and meeting all other requirements Future Appointments No visits were found meeting these conditions. Showing future appointments within next 90 days  and meeting all other requirements  I discussed the assessment and treatment plan with the patient. The patient was provided an opportunity to ask questions and all were answered. The patient agreed with the plan and demonstrated an understanding of the instructions.  Patient advised to call back or seek an in-person evaluation if the symptoms or condition worsens.  Duration of encounter: 30 minutes.  Note by: Amber Cola, MD Date: 10/24/2020; Time: 9:23 AM

## 2020-10-24 ENCOUNTER — Encounter: Payer: Self-pay | Admitting: Pain Medicine

## 2020-10-24 ENCOUNTER — Other Ambulatory Visit: Payer: Self-pay

## 2020-10-24 ENCOUNTER — Ambulatory Visit: Payer: Medicare Other | Attending: Pain Medicine | Admitting: Pain Medicine

## 2020-10-24 VITALS — BP 138/64 | HR 101 | Temp 97.0°F | Resp 16 | Ht 63.0 in | Wt 225.0 lb

## 2020-10-24 DIAGNOSIS — M545 Low back pain, unspecified: Secondary | ICD-10-CM | POA: Diagnosis present

## 2020-10-24 DIAGNOSIS — Z79899 Other long term (current) drug therapy: Secondary | ICD-10-CM | POA: Diagnosis present

## 2020-10-24 DIAGNOSIS — G894 Chronic pain syndrome: Secondary | ICD-10-CM | POA: Insufficient documentation

## 2020-10-24 DIAGNOSIS — M549 Dorsalgia, unspecified: Secondary | ICD-10-CM | POA: Diagnosis present

## 2020-10-24 DIAGNOSIS — G8929 Other chronic pain: Secondary | ICD-10-CM | POA: Diagnosis present

## 2020-10-24 DIAGNOSIS — M542 Cervicalgia: Secondary | ICD-10-CM | POA: Insufficient documentation

## 2020-10-24 NOTE — Progress Notes (Signed)
Nursing Pain Medication Assessment:  Safety precautions to be maintained throughout the outpatient stay will include: orient to surroundings, keep bed in low position, maintain call bell within reach at all times, provide assistance with transfer out of bed and ambulation.  Medication Inspection Compliance: Pill count conducted under aseptic conditions, in front of the patient. Neither the pills nor the bottle was removed from the patient's sight at any time. Once count was completed pills were immediately returned to the patient in their original bottle.  Medication: Oxycodone IR Pill/Patch Count: 68 of 90 pills remain Pill/Patch Appearance: Markings consistent with prescribed medication Bottle Appearance: Standard pharmacy container. Clearly labeled. Filled Date: 56 / 05 / 2021 Last Medication intake:  week or more ago.. causes severe itching.    68 tablets wasted in STericycle container,, witnessed by patient and Theodoro Doing RN

## 2023-02-18 ENCOUNTER — Telehealth: Payer: Self-pay

## 2023-02-18 NOTE — Telephone Encounter (Signed)
Introductory phone call made to patient prior to her new patient appointment with Dr. Tasia Catchings on 02/19/23. I introduced myself to patient and asked if she had any questions. Patient did not have any questions except to make sure where the [place] was located in which I explained to her.

## 2023-02-19 ENCOUNTER — Other Ambulatory Visit: Payer: Self-pay

## 2023-02-19 ENCOUNTER — Inpatient Hospital Stay: Payer: Medicare Other

## 2023-02-19 ENCOUNTER — Encounter: Payer: Self-pay | Admitting: Oncology

## 2023-02-19 ENCOUNTER — Inpatient Hospital Stay: Payer: Medicare Other | Attending: Oncology | Admitting: Oncology

## 2023-02-19 VITALS — BP 128/54 | HR 72 | Temp 97.0°F | Resp 18 | Wt 191.9 lb

## 2023-02-19 DIAGNOSIS — K7460 Unspecified cirrhosis of liver: Secondary | ICD-10-CM | POA: Insufficient documentation

## 2023-02-19 DIAGNOSIS — K7469 Other cirrhosis of liver: Secondary | ICD-10-CM

## 2023-02-19 DIAGNOSIS — D696 Thrombocytopenia, unspecified: Secondary | ICD-10-CM

## 2023-02-19 DIAGNOSIS — R161 Splenomegaly, not elsewhere classified: Secondary | ICD-10-CM | POA: Diagnosis not present

## 2023-02-19 DIAGNOSIS — N183 Chronic kidney disease, stage 3 unspecified: Secondary | ICD-10-CM | POA: Diagnosis not present

## 2023-02-19 DIAGNOSIS — Z87891 Personal history of nicotine dependence: Secondary | ICD-10-CM | POA: Diagnosis not present

## 2023-02-19 DIAGNOSIS — N1831 Chronic kidney disease, stage 3a: Secondary | ICD-10-CM

## 2023-02-19 LAB — CMP (CANCER CENTER ONLY)
ALT: 37 U/L (ref 0–44)
AST: 43 U/L — ABNORMAL HIGH (ref 15–41)
Albumin: 4.2 g/dL (ref 3.5–5.0)
Alkaline Phosphatase: 140 U/L — ABNORMAL HIGH (ref 38–126)
Anion gap: 8 (ref 5–15)
BUN: 24 mg/dL — ABNORMAL HIGH (ref 8–23)
CO2: 25 mmol/L (ref 22–32)
Calcium: 8.8 mg/dL — ABNORMAL LOW (ref 8.9–10.3)
Chloride: 102 mmol/L (ref 98–111)
Creatinine: 1.22 mg/dL — ABNORMAL HIGH (ref 0.44–1.00)
GFR, Estimated: 48 mL/min — ABNORMAL LOW (ref >60)
Glucose, Bld: 126 mg/dL — ABNORMAL HIGH (ref 70–99)
Potassium: 3.5 mmol/L (ref 3.5–5.1)
Sodium: 135 mmol/L (ref 135–145)
Total Bilirubin: 1 mg/dL (ref 0.3–1.2)
Total Protein: 8.2 g/dL — ABNORMAL HIGH (ref 6.5–8.1)

## 2023-02-19 LAB — CBC WITH DIFFERENTIAL/PLATELET
Abs Immature Granulocytes: 0.03 10*3/uL (ref 0.00–0.07)
Basophils Absolute: 0 10*3/uL (ref 0.0–0.1)
Basophils Relative: 0 %
Eosinophils Absolute: 0.1 10*3/uL (ref 0.0–0.5)
Eosinophils Relative: 2 %
HCT: 38.7 % (ref 36.0–46.0)
Hemoglobin: 13.1 g/dL (ref 12.0–15.0)
Immature Granulocytes: 1 %
Lymphocytes Relative: 14 %
Lymphs Abs: 0.8 10*3/uL (ref 0.7–4.0)
MCH: 28.5 pg (ref 26.0–34.0)
MCHC: 33.9 g/dL (ref 30.0–36.0)
MCV: 84.1 fL (ref 80.0–100.0)
Monocytes Absolute: 0.2 10*3/uL (ref 0.1–1.0)
Monocytes Relative: 4 %
Neutro Abs: 4.8 10*3/uL (ref 1.7–7.7)
Neutrophils Relative %: 79 %
Platelets: 107 10*3/uL — ABNORMAL LOW (ref 150–400)
RBC: 4.6 MIL/uL (ref 3.87–5.11)
RDW: 13.2 % (ref 11.5–15.5)
WBC: 6 10*3/uL (ref 4.0–10.5)
nRBC: 0 % (ref 0.0–0.2)

## 2023-02-19 LAB — IMMATURE PLATELET FRACTION: Immature Platelet Fraction: 2.1 % (ref 1.2–8.6)

## 2023-02-19 LAB — FOLATE: Folate: 10.2 ng/mL (ref >5.9)

## 2023-02-19 NOTE — Assessment & Plan Note (Signed)
Encourage oral hydration and avoid nephrotoxins.   

## 2023-02-19 NOTE — Progress Notes (Signed)
Hematology/Oncology Consult note [phone] [phone]        REFERRING PROVIDER: Sharyne Peach, MD   CHIEF COMPLAINTS/REASON FOR VISIT:  Evaluation of thrombocytopenia   ASSESSMENT & PLAN:   Thrombocytopenia ([place]) Acute on chronic thrombocytopenia.  Probably secondary to splenomegaly/cirrhosis Possible autoimmune etiology as well.  Rule out other etiologies. Check CBC, smear, folate, vitamin B12, multiple myeloma panel, light chain ratio, flow cytometry, immature platelet fraction.  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 Continue follow-up with hepatologist  Stage 3 chronic kidney disease ([place]) Encourage oral hydration and avoid nephrotoxins.     Orders Placed This Encounter  Procedures   Vitamin B12    Standing Status:   Future    Number of Occurrences:   1    Standing Expiration Date:   02/19/2024   Folate    Standing Status:   Future    Number of Occurrences:   1    Standing Expiration Date:   02/19/2024   CBC with Differential/Platelet    Standing Status:   Future    Number of Occurrences:   1    Standing Expiration Date:   02/19/2024   Multiple Myeloma Panel (SPEP&IFE w/QIG)    Standing Status:   Future    Number of Occurrences:   1    Standing Expiration Date:   02/19/2024   Kappa/lambda light chains    Standing Status:   Future    Number of Occurrences:   1    Standing Expiration Date:   02/19/2024   Flow cytometry panel-leukemia/lymphoma work-up    Standing Status:   Future    Number of Occurrences:   1    Standing Expiration Date:   02/19/2024   Immature Platelet Fraction    Standing Status:   Future    Number of Occurrences:   1    Standing Expiration Date:   02/19/2024   Hepatitis panel, acute    Standing Status:   Future    Number of Occurrences:   1    Standing Expiration Date:   02/19/2024   Follow-up plan to be determined. All questions were answered. The patient knows  to call the clinic with any problems, questions or concerns.  Earlie Server, MD, PhD [facility] Hematology Oncology 02/19/2023   HISTORY OF PRESENTING ILLNESS:   Amber Acosta is a  72 y.o.  female with PMH listed below was seen in consultation at the request of  Sharyne Peach, MD  for evaluation of thrombocytopenia  Patient has a history chronic thrombocytopenia.  Patient was previously seen by hematologist at [facility], last seen in 2014.  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 Patient has a history of liver cirrhosis, sarcoidosis, splenomegaly.  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  Currently not on any active treatment for psoriasis.  She follows up with rheumatology, hepatology  Recent blood work on 02/12/2023 shows a platelet count of 112,000.  Patient was referred to establish care with hematology for further evaluation. Patient reports feeling well today at her usual state of health.  She denies unintentional weight loss, night sweats, fever.  Appetite is good.  Reports easy bruising, upper arms and the sites of insulin injection.   MEDICAL HISTORY:  Past Medical History:  Diagnosis Date   Arthritis    Cervical spondylosis without myelopathy 09/21/2015   Chronic pain  Chronic pain associated with significant psychosocial dysfunction 08/28/2013   Cirrhosis ([place])    Depression    Diabetes mellitus without complication ([place])    Displacement of cervical intervertebral disc without myelopathy 09/05/2015   Fibromyalgia    Gallstones    GERD (gastroesophageal reflux disease)    Hypertension    Kidney disease, chronic, stage III (GFR 30-59 ml/min) (HCC)    Sarcoid    Splenomegaly    Thrombopenia (H[facility])     SURGICAL HISTORY: Past Surgical History:  Procedure Laterality Date   ABDOMINAL HYSTERECTOMY     BACK SURGERY     BREAST SURGERY     CYST  EXCISION     Middle of back 9/19   FRACTURE SURGERY     nose   HAND SURGERY     KNEE ARTHROSCOPY     right   neck fusion      SOCIAL HISTORY: Social History   Socioeconomic History   Marital status: Married    Spouse name: Not on file   Number of children: Not on file   Years of education: Not on file   Highest education level: Not on file  Occupational History   Not on file  Tobacco Use   Smoking status: Former    Packs/day: 3.00    Years: 7.00    Additional pack years: 0.00    Total pack years: 21.00    Types: Cigarettes    Quit date: 75    Years since quitting: 38.2   Smokeless tobacco: Never  Vaping Use   Vaping Use: Never used  Substance and Sexual Activity   Alcohol use: Not Currently    Comment: not anymore   Drug use: No   Sexual activity: Not on file  Other Topics Concern   Not on file  Social History Narrative   Not on file   Social Determinants of Health   Financial Resource Strain: Not on file  Food Insecurity: No Food Insecurity (02/19/2023)   Hunger Vital Sign    Worried About Running Out of Food in the Last Year: Never true    Ran Out of Food in the Last Year: Never true  Transportation Needs: No Transportation Needs (02/19/2023)   PRAPARE - Hydrologist (Medical): No    Lack of Transportation (Non-Medical): No  Physical Activity: Not on file  Stress: Not on file  Social Connections: Not on file  Intimate Partner Violence: Not At Risk (02/19/2023)   Humiliation, Afraid, Rape, and Kick questionnaire    Fear of Current or Ex-Partner: No    Emotionally Abused: No    Physically Abused: No    Sexually Abused: No    FAMILY HISTORY: Family History  Problem Relation Age of Onset   Alzheimer's disease Mother    Colon polyps Mother    COPD Father    Heart disease Father    Diabetes Father    Alcohol abuse Brother    Cancer Maternal Grandmother        intestinal cancer    ALLERGIES:  is allergic to aspirin,  ciprofloxacin, etanercept, metformin, methotrexate, mushroom extract complex, other, darvon [propoxyphene], minocycline, and cobalamin combinations.  MEDICATIONS:  Current Outpatient Medications  Medication Sig Dispense Refill   amLODipine (NORVASC) 10 MG tablet Take 10 mg by mouth daily.      busPIRone (BUSPAR) 7.5 MG tablet Take 7.5 mg by mouth 2 (two) times daily.     carvedilol (COREG) 6.25 MG tablet Take 6.25  mg by mouth 2 (two) times daily with a meal.     empagliflozin (JARDIANCE) 25 MG TABS tablet Take 25 mg by mouth daily.     FLUoxetine (PROZAC) 40 MG capsule Take 40 mg by mouth daily.      hydrOXYzine (ATARAX) 50 MG tablet Take 50 mg by mouth every 8 (eight) hours as needed.     losartan (COZAAR) 100 MG tablet TAKE 1 TABLET (100 MG TOTAL) BY MOUTH ONCE DAILY.     lovastatin (MEVACOR) 10 MG tablet Take 10 mg by mouth at bedtime.     omeprazole (PRILOSEC) 40 MG capsule Take 40 mg by mouth daily.     tiZANidine (ZANAFLEX) 4 MG tablet Take 1 tablet (4 mg total) by mouth 2 (two) times daily as needed for muscle spasms. 60 tablet 2   ADVAIR DISKUS 250-50 MCG/DOSE AEPB Inhale 1 puff into the lungs as needed.  (Patient not taking: Reported on 02/19/2023)     albuterol (PROVENTIL HFA;VENTOLIN HFA) 108 (90 Base) MCG/ACT inhaler Inhale 2 puffs into the lungs every 6 (six) hours as needed.  (Patient not taking: Reported on 02/19/2023)     Cholecalciferol (VITAMIN D3) 125 MCG (5000 UT) CAPS Take 5,000 Units by mouth daily. (Patient not taking: Reported on 02/19/2023)     Cyanocobalamin (VITAMIN B 12 PO) Take 1,000 mcg by mouth daily. (Patient not taking: Reported on 02/19/2023)     Dulaglutide 0.75 MG/0.5ML SOPN Inject 0.75 mg into the skin once a week. (Patient not taking: Reported on 02/19/2023)     furosemide (LASIX) 40 MG tablet Take 40 mg by mouth as needed.  (Patient not taking: Reported on 02/19/2023)     hydrALAZINE (APRESOLINE) 25 MG tablet Take 25 mg by mouth 2 (two) times daily.      insulin  NPH Human (HUMULIN N,NOVOLIN N) 100 UNIT/ML injection 90 units in the am and 40 units at supper     insulin regular (NOVOLIN R) 100 units/mL injection Inject 94 Units into the skin every morning.  (Patient not taking: Reported on 02/19/2023)     KLOR-CON 10 10 MEQ tablet Take 10 mEq by mouth daily.  (Patient not taking: Reported on 02/19/2023)     metoprolol succinate (TOPROL-XL) 50 MG 24 hr tablet Take 50 mg by mouth daily.  (Patient not taking: Reported on 02/19/2023)     nitroGLYCERIN (NITROSTAT) 0.3 MG SL tablet 1 tablet sublingual prn for ESOPHAGEAL SPASMS(may repeat every 5 min, seek med help if pain persists after 3 tablets) (Patient not taking: Reported on 02/19/2023)     nortriptyline (PAMELOR) 10 MG capsule Take 10 mg by mouth at bedtime. (Patient not taking: Reported on 02/19/2023)     oxyCODONE (OXY IR/ROXICODONE) 5 MG immediate release tablet Take 1 tablet (5 mg total) by mouth every 8 (eight) hours as needed for severe pain. Must last 30 days 90 tablet 0   No current facility-administered medications for this visit.    Review of Systems  Constitutional:  Negative for appetite change, chills, fatigue and fever.  HENT:   Negative for hearing loss and voice change.   Eyes:  Negative for eye problems.  Respiratory:  Negative for chest tightness and cough.   Cardiovascular:  Negative for chest pain.  Gastrointestinal:  Negative for abdominal distention, abdominal pain and blood in stool.  Endocrine: Negative for hot flashes.  Genitourinary:  Negative for difficulty urinating and frequency.   Musculoskeletal:  Negative for arthralgias.  Skin:  Negative for itching and  rash.  Neurological:  Negative for extremity weakness.  Hematological:  Negative for adenopathy. Bruises/bleeds easily.  Psychiatric/Behavioral:  Negative for confusion.    PHYSICAL EXAMINATION: ECOG PERFORMANCE STATUS: 1 - Symptomatic but completely ambulatory Vitals:   02/19/23 0926  BP: (!) 128/54  Pulse: 72  Resp:  18  Temp: (!) 97 F (36.1 C)   Filed Weights   02/19/23 0926  Weight: 191 lb 14.4 oz (87 kg)    Physical Exam Constitutional:      General: She is not in acute distress. HENT:     Head: Normocephalic and atraumatic.  Eyes:     General: No scleral icterus. Cardiovascular:     Rate and Rhythm: Normal rate and regular rhythm.     Heart sounds: Normal heart sounds.  Pulmonary:     Effort: Pulmonary effort is normal. No respiratory distress.     Breath sounds: No wheezing.  Abdominal:     General: Bowel sounds are normal.     Palpations: Abdomen is soft.     Comments: Patient has mild tenderness of the right upper quadrant and left upper quadrant  Musculoskeletal:        General: No deformity. Normal range of motion.     Cervical back: Normal range of motion and neck supple.  Skin:    General: Skin is warm and dry.     Findings: Bruising present. No erythema or rash.  Neurological:     Mental Status: She is alert and oriented to person, place, and time. Mental status is at baseline.     Cranial Nerves: No cranial nerve deficit.     Coordination: Coordination normal.  Psychiatric:        Mood and Affect: Mood normal.     LABORATORY DATA:  I have reviewed the data as listed    Latest Ref Rng & Units 02/19/2023   10:05 AM 03/26/2016    9:47 AM  CBC  WBC 4.0 - 10.5 K/uL 6.0    Hemoglobin 12.0 - 15.0 g/dL 13.1    Hematocrit 36.0 - 46.0 % 38.7    Platelets 150 - 400 K/uL 107  146       Latest Ref Rng & Units 02/19/2023   10:05 AM 03/26/2016    9:47 AM 10/19/2013    3:12 PM  CMP  Glucose 70 - 99 mg/dL 126  96  172   BUN 8 - 23 mg/dL 24  14  18    Creatinine 0.44 - 1.00 mg/dL 1.22  1.28  1.27   Sodium 135 - 145 mmol/L 135  136  133   Potassium 3.5 - 5.1 mmol/L 3.5  3.8  4.0   Chloride 98 - 111 mmol/L 102  100  99   CO2 22 - 32 mmol/L 25  28  31    Calcium 8.9 - 10.3 mg/dL 8.8  8.5  8.9   Total Protein 6.5 - 8.1 g/dL 8.2  7.4    Total Bilirubin 0.3 - 1.2 mg/dL 1.0  0.7     Alkaline Phos 38 - 126 U/L 140  70    AST 15 - 41 U/L 43  24    ALT 0 - 44 U/L 37  20        RADIOGRAPHIC STUDIES: I have personally reviewed the radiological images as listed and agreed with the findings in the report. No results found.

## 2023-02-19 NOTE — Assessment & Plan Note (Signed)
Acute on chronic thrombocytopenia.  Probably secondary to splenomegaly/cirrhosis Possible autoimmune etiology as well.  Rule out other etiologies. Check CBC, smear, folate, vitamin B12, multiple myeloma panel, light chain ratio, flow cytometry, immature platelet fraction.

## 2023-02-19 NOTE — Assessment & Plan Note (Signed)
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 Continue follow-up with hepatologist

## 2023-02-20 LAB — HEPATITIS PANEL, ACUTE
HCV Ab: NONREACTIVE
Hep A IgM: NONREACTIVE
Hep B C IgM: NONREACTIVE
Hepatitis B Surface Ag: NONREACTIVE

## 2023-02-20 LAB — KAPPA/LAMBDA LIGHT CHAINS
Kappa free light chain: 49.9 mg/L — ABNORMAL HIGH (ref 3.3–19.4)
Kappa, lambda light chain ratio: 1.78 — ABNORMAL HIGH (ref 0.26–1.65)
Lambda free light chains: 28 mg/L — ABNORMAL HIGH (ref 5.7–26.3)

## 2023-02-20 LAB — VITAMIN B12: Vitamin B-12: 542 pg/mL (ref 180–914)

## 2023-02-23 LAB — COMP PANEL: LEUKEMIA/LYMPHOMA

## 2023-02-24 LAB — MULTIPLE MYELOMA PANEL, SERUM
Albumin SerPl Elph-Mcnc: 3.7 g/dL (ref 2.9–4.4)
Albumin/Glob SerPl: 1.1 (ref 0.7–1.7)
Alpha 1: 0.3 g/dL (ref 0.0–0.4)
Alpha2 Glob SerPl Elph-Mcnc: 0.6 g/dL (ref 0.4–1.0)
B-Globulin SerPl Elph-Mcnc: 1.2 g/dL (ref 0.7–1.3)
Gamma Glob SerPl Elph-Mcnc: 1.5 g/dL (ref 0.4–1.8)
Globulin, Total: 3.7 g/dL (ref 2.2–3.9)
IgA: 529 mg/dL — ABNORMAL HIGH (ref 87–352)
IgG (Immunoglobin G), Serum: 1248 mg/dL (ref 586–1602)
IgM (Immunoglobulin M), Srm: 181 mg/dL (ref 26–217)
Total Protein ELP: 7.4 g/dL (ref 6.0–8.5)

## 2023-03-04 ENCOUNTER — Telehealth: Payer: Self-pay

## 2023-03-04 DIAGNOSIS — D696 Thrombocytopenia, unspecified: Secondary | ICD-10-CM

## 2023-03-04 NOTE — Telephone Encounter (Signed)
-----   Message from Earlie Server, MD sent at 03/03/2023  5:52 PM EDT ----- I have sent her a MyChart message Please arrange patient to follow-up in 6 months, lab MD same-day, please order CBC, CMP, immature platelet fraction.  Thank you

## 2023-03-04 NOTE — Telephone Encounter (Signed)
Please schedule and inform pt of appts:   Lab/MD in 6 months (cbc,cmp, imm plt fr)

## 2023-03-04 NOTE — Progress Notes (Signed)
Ph note created   

## 2023-09-03 ENCOUNTER — Inpatient Hospital Stay: Payer: Medicare Other | Attending: Oncology

## 2023-09-03 ENCOUNTER — Inpatient Hospital Stay: Payer: Medicare Other | Admitting: Oncology

## 2023-09-03 ENCOUNTER — Encounter: Payer: Self-pay | Admitting: Oncology

## 2023-09-03 VITALS — BP 135/68 | HR 71 | Temp 96.4°F | Resp 18 | Wt 191.1 lb

## 2023-09-03 DIAGNOSIS — K7469 Other cirrhosis of liver: Secondary | ICD-10-CM

## 2023-09-03 DIAGNOSIS — D696 Thrombocytopenia, unspecified: Secondary | ICD-10-CM | POA: Diagnosis present

## 2023-09-03 DIAGNOSIS — N1831 Chronic kidney disease, stage 3a: Secondary | ICD-10-CM | POA: Diagnosis not present

## 2023-09-03 DIAGNOSIS — N183 Chronic kidney disease, stage 3 unspecified: Secondary | ICD-10-CM | POA: Diagnosis not present

## 2023-09-03 DIAGNOSIS — K7460 Unspecified cirrhosis of liver: Secondary | ICD-10-CM | POA: Diagnosis not present

## 2023-09-03 DIAGNOSIS — Z87891 Personal history of nicotine dependence: Secondary | ICD-10-CM | POA: Diagnosis not present

## 2023-09-03 DIAGNOSIS — Z79899 Other long term (current) drug therapy: Secondary | ICD-10-CM | POA: Diagnosis not present

## 2023-09-03 LAB — CMP (CANCER CENTER ONLY)
ALT: 21 U/L (ref 0–44)
AST: 33 U/L (ref 15–41)
Albumin: 4.2 g/dL (ref 3.5–5.0)
Alkaline Phosphatase: 122 U/L (ref 38–126)
Anion gap: 6 (ref 5–15)
BUN: 14 mg/dL (ref 8–23)
CO2: 25 mmol/L (ref 22–32)
Calcium: 8.7 mg/dL — ABNORMAL LOW (ref 8.9–10.3)
Chloride: 103 mmol/L (ref 98–111)
Creatinine: 1.17 mg/dL — ABNORMAL HIGH (ref 0.44–1.00)
GFR, Estimated: 50 mL/min — ABNORMAL LOW (ref >60)
Glucose, Bld: 123 mg/dL — ABNORMAL HIGH (ref 70–99)
Potassium: 3.9 mmol/L (ref 3.5–5.1)
Sodium: 134 mmol/L — ABNORMAL LOW (ref 135–145)
Total Bilirubin: 1 mg/dL (ref 0.3–1.2)
Total Protein: 8.1 g/dL (ref 6.5–8.1)

## 2023-09-03 LAB — CBC WITH DIFFERENTIAL (CANCER CENTER ONLY)
Abs Immature Granulocytes: 0.03 10*3/uL (ref 0.00–0.07)
Basophils Absolute: 0 10*3/uL (ref 0.0–0.1)
Basophils Relative: 1 %
Eosinophils Absolute: 0.2 10*3/uL (ref 0.0–0.5)
Eosinophils Relative: 3 %
HCT: 40 % (ref 36.0–46.0)
Hemoglobin: 13.7 g/dL (ref 12.0–15.0)
Immature Granulocytes: 0 %
Lymphocytes Relative: 17 %
Lymphs Abs: 1.2 10*3/uL (ref 0.7–4.0)
MCH: 28.1 pg (ref 26.0–34.0)
MCHC: 34.3 g/dL (ref 30.0–36.0)
MCV: 82 fL (ref 80.0–100.0)
Monocytes Absolute: 0.4 10*3/uL (ref 0.1–1.0)
Monocytes Relative: 6 %
Neutro Abs: 5.3 10*3/uL (ref 1.7–7.7)
Neutrophils Relative %: 73 %
Platelet Count: 101 10*3/uL — ABNORMAL LOW (ref 150–400)
RBC: 4.88 MIL/uL (ref 3.87–5.11)
RDW: 12.8 % (ref 11.5–15.5)
WBC Count: 7.2 10*3/uL (ref 4.0–10.5)
nRBC: 0 % (ref 0.0–0.2)

## 2023-09-03 LAB — IMMATURE PLATELET FRACTION: Immature Platelet Fraction: 2.1 % (ref 1.2–8.6)

## 2023-09-03 NOTE — Progress Notes (Signed)
Pt here for follow up. No new concerns voiced.   

## 2023-09-03 NOTE — Assessment & Plan Note (Addendum)
Acute on chronic thrombocytopenia.  Likely secondary to splenomegaly/cirrhosis Possible autoimmune etiology as well.   Previous workup showed adequate B12 and folate level.  Negative M protein on SPEP.  Increase of both kappa and lambda light chain with only mildly increased light chain ratio.  Nonspecific.  Negative peripheral flow cytometry. Today's labs were reviewed and discussed with patient.  Platelet count is stable, above 100,000. Recommend observation.

## 2023-09-03 NOTE — Progress Notes (Signed)
Hematology/Oncology Consult note [phone] [phone]        REFERRING PROVIDER: Rayetta Humphrey, MD   CHIEF COMPLAINTS/REASON FOR VISIT:  Thrombocytopenia   ASSESSMENT & PLAN:   Thrombocytopenia (HCC) Acute on chronic thrombocytopenia.  Likely secondary to splenomegaly/cirrhosis Possible autoimmune etiology as well.   Previous workup showed adequate B12 and folate level.  Negative M protein on SPEP.  Increase of both kappa and lambda light chain with only mildly increased light chain ratio.  Nonspecific.  Negative peripheral flow cytometry. Today's labs were reviewed and discussed with patient.  Platelet count is stable, above 100,000. Recommend observation.  Stage 3 chronic kidney disease (HCC) Encourage oral hydration and avoid nephrotoxins.    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 Continue follow-up with hepatologist    Orders Placed This Encounter  Procedures   CBC with Differential (Cancer Center Only)    Standing Status:   Future    Standing Expiration Date:   09/02/2024   CMP (Cancer Center only)    Standing Status:   Future    Standing Expiration Date:   09/02/2024   Protime-INR    Standing Status:   Future    Standing Expiration Date:   09/02/2024   APTT    Standing Status:   Future    Standing Expiration Date:   09/02/2024   Follow-up 6 months  all questions were answered. The patient knows to call the clinic with any problems, questions or concerns.  Rickard Patience, MD, PhD [facility] Hematology Oncology 09/03/2023   HISTORY OF PRESENTING ILLNESS:   Amber Acosta is a  72 y.o.  female with PMH listed below was seen in consultation at the request of  Rayetta Humphrey, MD  for evaluation of thrombocytopenia  Patient has a history chronic thrombocytopenia.  Patient was previously seen by hematologist at [facility], last seen in 2014.  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 Patient has a history of liver cirrhosis, sarcoidosis, splenomegaly.  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  Currently not on any active treatment for psoriasis.  She follows up with rheumatology, hepatology  Recent blood work on 02/12/2023 shows a platelet count of 112,000.  Patient was referred to establish care with hematology for further evaluation. Patient reports feeling well today at her usual state of health.  She denies unintentional weight loss, night sweats, fever.  Appetite is good.  Reports easy bruising, upper arms and the sites of insulin injection.  INTERVAL HISTORY Amber Acosta is a 72 y.o. female who has above history reviewed by me today presents for follow up visit for thrombocytopenia. She reports easy bruising no active bleeding events.  MEDICAL HISTORY:  Past Medical History:  Diagnosis Date   Arthritis    Cervical spondylosis without myelopathy 09/21/2015   Chronic pain    Chronic pain associated with significant psychosocial dysfunction 08/28/2013   Cirrhosis (HCC)    Depression    Diabetes mellitus without complication (HCC)    Displacement of cervical intervertebral disc without myelopathy 09/05/2015   Fibromyalgia    Gallstones    GERD (gastroesophageal reflux disease)    Hypertension    Kidney disease, chronic, stage III (GFR 30-59 ml/min) (HCC)    Sarcoid    Splenomegaly    Thrombopenia (HCC)     SURGICAL HISTORY: Past Surgical History:  Procedure Laterality Date   ABDOMINAL  HYSTERECTOMY     BACK SURGERY     BREAST SURGERY     CYST EXCISION     Middle of back 9/19   FRACTURE SURGERY     nose   HAND SURGERY     KNEE ARTHROSCOPY     right   neck fusion      SOCIAL HISTORY: Social History   Socioeconomic History   Marital status: Married    Spouse name: Not on file   Number of children: Not on file   Years of  education: Not on file   Highest education level: Not on file  Occupational History   Not on file  Tobacco Use   Smoking status: Former    Current packs/day: 0.00    Average packs/day: 3.0 packs/day for 7.0 years (21.0 ttl pk-yrs)    Types: Cigarettes    Start date: 68    Quit date: 37    Years since quitting: 38.7   Smokeless tobacco: Never  Vaping Use   Vaping status: Never Used  Substance and Sexual Activity   Alcohol use: Not Currently    Comment: not anymore   Drug use: No   Sexual activity: Not on file  Other Topics Concern   Not on file  Social History Narrative   Not on file   Social Determinants of Health   Financial Resource Strain: Low Risk  (03/20/2023)   Received from [facility]   Overall Financial Resource Strain (CARDIA)    Difficulty of Paying Living Expenses: Not hard at all  Food Insecurity: No Food Insecurity (07/30/2023)   Received from [facility]   Hunger Vital Sign    Worried About Running Out of Food in the Last Year: Never true    Ran Out of Food in the Last Year: Never true  Transportation Needs: No Transportation Needs (07/30/2023)   Received from [facility] - Transportation    In the past 12 months, has lack of transportation kept you from medical appointments or from getting medications?: No    Lack of Transportation (Non-Medical): No  Physical Activity: Not on file  Stress: Not on file  Social Connections: Not on file  Intimate Partner Violence: Not At Risk (02/19/2023)   Humiliation, Afraid, Rape, and Kick questionnaire    Fear of Current or Ex-Partner: No    Emotionally Abused: No    Physically Abused: No    Sexually Abused: No    FAMILY HISTORY: Family History  Problem Relation Age of Onset   Alzheimer's disease Mother    Colon polyps Mother    COPD Father    Heart disease Father    Diabetes Father    Alcohol abuse Brother    Cancer Maternal Grandmother         intestinal cancer    ALLERGIES:  is allergic to aspirin, ciprofloxacin, etanercept, metformin, methotrexate, mushroom extract complex, other, darvon [propoxyphene], minocycline, and cobalamin combinations.  MEDICATIONS:  Current Outpatient Medications  Medication Sig Dispense Refill   albuterol (PROVENTIL HFA;VENTOLIN HFA) 108 (90 Base) MCG/ACT inhaler Inhale 2 puffs into the lungs every 6 (six) hours as needed.     amLODipine (NORVASC) 10 MG tablet Take 10 mg by mouth daily.      carvedilol (COREG) 6.25 MG tablet Take 6.25 mg by mouth 2 (two) times daily with a meal.     empagliflozin (JARDIANCE) 25 MG TABS tablet Take 25 mg by mouth daily.  FLUoxetine (PROZAC) 40 MG capsule Take 40 mg by mouth daily.      furosemide (LASIX) 40 MG tablet Take 40 mg by mouth as needed.     hydroxychloroquine (PLAQUENIL) 200 MG tablet Take 400 mg by mouth daily.     hydrOXYzine (ATARAX) 50 MG tablet Take 50 mg by mouth every 8 (eight) hours as needed.     insulin NPH Human (HUMULIN N,NOVOLIN N) 100 UNIT/ML injection 90 units in the am and 36 units at supper     insulin regular (NOVOLIN R) 100 units/mL injection Inject into the skin every morning. Per sliding scale     isosorbide mononitrate (IMDUR) 30 MG 24 hr tablet Take 30 mg by mouth daily.     losartan (COZAAR) 100 MG tablet TAKE 1 TABLET (100 MG TOTAL) BY MOUTH ONCE DAILY.     lovastatin (MEVACOR) 10 MG tablet Take 10 mg by mouth at bedtime.     omeprazole (PRILOSEC) 40 MG capsule Take 40 mg by mouth daily.     tiZANidine (ZANAFLEX) 4 MG tablet Take 1 tablet (4 mg total) by mouth 2 (two) times daily as needed for muscle spasms. 60 tablet 2   busPIRone (BUSPAR) 7.5 MG tablet Take 7.5 mg by mouth 2 (two) times daily.     nitroGLYCERIN (NITROSTAT) 0.3 MG SL tablet 1 tablet sublingual prn for ESOPHAGEAL SPASMS(may repeat every 5 min, seek med help if pain persists after 3 tablets) (Patient not taking: Reported on 02/19/2023)     No current  facility-administered medications for this visit.    Review of Systems  Constitutional:  Negative for appetite change, chills, fatigue and fever.  HENT:   Negative for hearing loss and voice change.   Eyes:  Negative for eye problems.  Respiratory:  Negative for chest tightness and cough.   Cardiovascular:  Negative for chest pain.  Gastrointestinal:  Negative for abdominal distention, abdominal pain and blood in stool.  Endocrine: Negative for hot flashes.  Genitourinary:  Negative for difficulty urinating and frequency.   Musculoskeletal:  Negative for arthralgias.  Skin:  Negative for itching and rash.  Neurological:  Negative for extremity weakness.  Hematological:  Negative for adenopathy. Bruises/bleeds easily.  Psychiatric/Behavioral:  Negative for confusion.    PHYSICAL EXAMINATION: ECOG PERFORMANCE STATUS: 1 - Symptomatic but completely ambulatory Vitals:   09/03/23 1336  BP: 135/68  Pulse: 71  Resp: 18  Temp: (!) 96.4 F (35.8 C)   Filed Weights   09/03/23 1336  Weight: 191 lb 1.6 oz (86.7 kg)    Physical Exam Constitutional:      General: She is not in acute distress. HENT:     Head: Normocephalic and atraumatic.  Eyes:     General: No scleral icterus. Cardiovascular:     Rate and Rhythm: Normal rate and regular rhythm.     Heart sounds: Normal heart sounds.  Pulmonary:     Effort: Pulmonary effort is normal. No respiratory distress.     Breath sounds: No wheezing.  Abdominal:     General: Bowel sounds are normal.     Palpations: Abdomen is soft.  Musculoskeletal:        General: No deformity. Normal range of motion.     Cervical back: Normal range of motion and neck supple.  Skin:    General: Skin is warm and dry.     Findings: Bruising present. No rash.  Neurological:     Mental Status: She is alert and oriented to person, place,  and time. Mental status is at baseline.     Cranial Nerves: No cranial nerve deficit.     Coordination: Coordination  normal.  Psychiatric:        Mood and Affect: Mood normal.     LABORATORY DATA:  I have reviewed the data as listed    Latest Ref Rng & Units 09/03/2023    1:17 PM 02/19/2023   10:05 AM 03/26/2016    9:47 AM  CBC  WBC 4.0 - 10.5 K/uL 7.2  6.0    Hemoglobin 12.0 - 15.0 g/dL 08.6  57.8    Hematocrit 36.0 - 46.0 % 40.0  38.7    Platelets 150 - 400 K/uL 101  107  146       Latest Ref Rng & Units 09/03/2023    1:17 PM 02/19/2023   10:05 AM 03/26/2016    9:47 AM  CMP  Glucose 70 - 99 mg/dL 469  629  96   BUN 8 - 23 mg/dL 14  24  14    Creatinine 0.44 - 1.00 mg/dL 5.28  4.13  2.44   Sodium 135 - 145 mmol/L 134  135  136   Potassium 3.5 - 5.1 mmol/L 3.9  3.5  3.8   Chloride 98 - 111 mmol/L 103  102  100   CO2 22 - 32 mmol/L 25  25  28    Calcium 8.9 - 10.3 mg/dL 8.7  8.8  8.5   Total Protein 6.5 - 8.1 g/dL 8.1  8.2  7.4   Total Bilirubin 0.3 - 1.2 mg/dL 1.0  1.0  0.7   Alkaline Phos 38 - 126 U/L 122  140  70   AST 15 - 41 U/L 33  43  24   ALT 0 - 44 U/L 21  37  20       RADIOGRAPHIC STUDIES: I have personally reviewed the radiological images as listed and agreed with the findings in the report. No results found.

## 2023-09-03 NOTE — Assessment & Plan Note (Signed)
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 Continue follow-up with hepatologist

## 2023-09-03 NOTE — Assessment & Plan Note (Signed)
Encourage oral hydration and avoid nephrotoxins.   

## 2024-03-10 ENCOUNTER — Encounter: Payer: Self-pay | Admitting: Oncology

## 2024-03-10 ENCOUNTER — Inpatient Hospital Stay (HOSPITAL_BASED_OUTPATIENT_CLINIC_OR_DEPARTMENT_OTHER): Payer: Medicare Other | Admitting: Oncology

## 2024-03-10 ENCOUNTER — Inpatient Hospital Stay: Payer: Medicare Other | Attending: Oncology

## 2024-03-10 VITALS — BP 110/55 | HR 66 | Temp 97.6°F | Resp 16 | Wt 196.0 lb

## 2024-03-10 DIAGNOSIS — D696 Thrombocytopenia, unspecified: Secondary | ICD-10-CM | POA: Insufficient documentation

## 2024-03-10 DIAGNOSIS — K7460 Unspecified cirrhosis of liver: Secondary | ICD-10-CM | POA: Insufficient documentation

## 2024-03-10 DIAGNOSIS — Z79899 Other long term (current) drug therapy: Secondary | ICD-10-CM | POA: Diagnosis not present

## 2024-03-10 DIAGNOSIS — Z87891 Personal history of nicotine dependence: Secondary | ICD-10-CM | POA: Diagnosis not present

## 2024-03-10 DIAGNOSIS — N183 Chronic kidney disease, stage 3 unspecified: Secondary | ICD-10-CM | POA: Diagnosis not present

## 2024-03-10 DIAGNOSIS — N1831 Chronic kidney disease, stage 3a: Secondary | ICD-10-CM | POA: Diagnosis not present

## 2024-03-10 DIAGNOSIS — K7469 Other cirrhosis of liver: Secondary | ICD-10-CM

## 2024-03-10 LAB — CBC WITH DIFFERENTIAL (CANCER CENTER ONLY)
Abs Immature Granulocytes: 0.06 10*3/uL (ref 0.00–0.07)
Basophils Absolute: 0.1 10*3/uL (ref 0.0–0.1)
Basophils Relative: 1 %
Eosinophils Absolute: 0.2 10*3/uL (ref 0.0–0.5)
Eosinophils Relative: 2 %
HCT: 36.4 % (ref 36.0–46.0)
Hemoglobin: 12.6 g/dL (ref 12.0–15.0)
Immature Granulocytes: 1 %
Lymphocytes Relative: 15 %
Lymphs Abs: 1.3 10*3/uL (ref 0.7–4.0)
MCH: 29.1 pg (ref 26.0–34.0)
MCHC: 34.6 g/dL (ref 30.0–36.0)
MCV: 84.1 fL (ref 80.0–100.0)
Monocytes Absolute: 0.4 10*3/uL (ref 0.1–1.0)
Monocytes Relative: 5 %
Neutro Abs: 6.5 10*3/uL (ref 1.7–7.7)
Neutrophils Relative %: 76 %
Platelet Count: 120 10*3/uL — ABNORMAL LOW (ref 150–400)
RBC: 4.33 MIL/uL (ref 3.87–5.11)
RDW: 13.2 % (ref 11.5–15.5)
WBC Count: 8.5 10*3/uL (ref 4.0–10.5)
nRBC: 0 % (ref 0.0–0.2)

## 2024-03-10 LAB — CMP (CANCER CENTER ONLY)
ALT: 27 U/L (ref 0–44)
AST: 30 U/L (ref 15–41)
Albumin: 3.9 g/dL (ref 3.5–5.0)
Alkaline Phosphatase: 143 U/L — ABNORMAL HIGH (ref 38–126)
Anion gap: 6 (ref 5–15)
BUN: 34 mg/dL — ABNORMAL HIGH (ref 8–23)
CO2: 24 mmol/L (ref 22–32)
Calcium: 8.3 mg/dL — ABNORMAL LOW (ref 8.9–10.3)
Chloride: 103 mmol/L (ref 98–111)
Creatinine: 1.22 mg/dL — ABNORMAL HIGH (ref 0.44–1.00)
GFR, Estimated: 47 mL/min — ABNORMAL LOW (ref >60)
Glucose, Bld: 197 mg/dL — ABNORMAL HIGH (ref 70–99)
Potassium: 3.8 mmol/L (ref 3.5–5.1)
Sodium: 133 mmol/L — ABNORMAL LOW (ref 135–145)
Total Bilirubin: 0.8 mg/dL (ref 0.0–1.2)
Total Protein: 7.2 g/dL (ref 6.5–8.1)

## 2024-03-10 LAB — APTT: aPTT: 32 s (ref 24–36)

## 2024-03-10 LAB — PROTIME-INR
INR: 1.1 (ref 0.8–1.2)
Prothrombin Time: 14.5 s (ref 11.4–15.2)

## 2024-03-10 NOTE — Assessment & Plan Note (Signed)
 Encourage oral hydration and avoid nephrotoxins.

## 2024-03-10 NOTE — Assessment & Plan Note (Signed)
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 Continue follow-up with hepatologist

## 2024-03-10 NOTE — Assessment & Plan Note (Signed)
Acute on chronic thrombocytopenia.  Likely secondary to splenomegaly/cirrhosis Possible autoimmune etiology as well.   Previous workup showed adequate B12 and folate level.  Negative M protein on SPEP.  Increase of both kappa and lambda light chain with only mildly increased light chain ratio.  Nonspecific.  Negative peripheral flow cytometry. Today's labs were reviewed and discussed with patient.  Platelet count is stable, above 100,000. Recommend observation.

## 2024-03-10 NOTE — Progress Notes (Signed)
 Hematology/Oncology Consult note [phone] [phone]        REFERRING PROVIDER: Rayetta Humphrey, MD   CHIEF COMPLAINTS/REASON FOR VISIT:  Thrombocytopenia   ASSESSMENT & PLAN:   Thrombocytopenia (HCC) Acute on chronic thrombocytopenia.  Likely secondary to splenomegaly/cirrhosis Possible autoimmune etiology as well.   Previous workup showed adequate B12 and folate level.  Negative M protein on SPEP.  Increase of both kappa and lambda light chain with only mildly increased light chain ratio.  Nonspecific.  Negative peripheral flow cytometry. Today's labs were reviewed and discussed with patient.  Platelet count is stable, above 100,000. Recommend observation.  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 Continue follow-up with hepatologist  Stage 3 chronic kidney disease (HCC) Encourage oral hydration and avoid nephrotoxins.      Orders Placed This Encounter  Procedures   CBC with Differential (Cancer Center Only)    Standing Status:   Future    Expected Date:   09/09/2024    Expiration Date:   03/10/2025   Follow-up 6 months  all questions were answered. The patient knows to call the clinic with any problems, questions or concerns.  Rickard Patience, MD, PhD [facility] Hematology Oncology 03/10/2024   HISTORY OF PRESENTING ILLNESS:   Amber Acosta is a  73 y.o.  female with PMH listed below was seen in consultation at the request of  Rayetta Humphrey, MD  for evaluation of thrombocytopenia  Patient has a history chronic thrombocytopenia.  Patient was previously seen by hematologist at [facility], last seen in 2014.  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 Patient has a history of liver cirrhosis, sarcoidosis, splenomegaly.  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  Currently not on  any active treatment for psoriasis.  She follows up with rheumatology, hepatology  Recent blood work on 02/12/2023 shows a platelet count of 112,000.  Patient was referred to establish care with hematology for further evaluation. Patient reports feeling well today at her usual state of health.  She denies unintentional weight loss, night sweats, fever.  Appetite is good.  Reports easy bruising, upper arms and the sites of insulin injection.  INTERVAL HISTORY Amber Acosta is a 73 y.o. female who has above history reviewed by me today presents for follow up visit for thrombocytopenia. She reports easy bruising. No active bleeding events. She follows up with hepatologist at [facility]. She get AFP and US liver surveillance.    MEDICAL HISTORY:  Past Medical History:  Diagnosis Date   Arthritis    Cervical spondylosis without myelopathy 09/21/2015   Chronic pain    Chronic pain associated with significant psychosocial dysfunction 08/28/2013   Cirrhosis (HCC)    Depression    Diabetes mellitus without complication (HCC)    Displacement of cervical intervertebral disc without myelopathy 09/05/2015   Fibromyalgia    Gallstones    GERD (gastroesophageal reflux disease)    Hypertension    Kidney disease, chronic, stage III (GFR 30-59 ml/min) (HCC)    Sarcoid    Splenomegaly    Thrombopenia (HCC)     SURGICAL HISTORY: Past Surgical History:  Procedure Laterality Date   ABDOMINAL HYSTERECTOMY     BACK SURGERY     BREAST SURGERY     CYST EXCISION     Middle of back 9/19   FRACTURE SURGERY     nose   HAND SURGERY  KNEE ARTHROSCOPY     right   neck fusion      SOCIAL HISTORY: Social History   Socioeconomic History   Marital status: Married    Spouse name: Not on file   Number of children: Not on file   Years of education: Not on file   Highest education level: Not on file  Occupational History   Not on file  Tobacco Use   Smoking status: Former    Current packs/day: 0.00     Average packs/day: 3.0 packs/day for 7.0 years (21.0 ttl pk-yrs)    Types: Cigarettes    Start date: 59    Quit date: 51    Years since quitting: 39.2   Smokeless tobacco: Never  Vaping Use   Vaping status: Never Used  Substance and Sexual Activity   Alcohol use: Not Currently    Comment: not anymore   Drug use: No   Sexual activity: Not on file  Other Topics Concern   Not on file  Social History Narrative   Not on file   Social Drivers of Health   Financial Resource Strain: Low Risk  (09/29/2023)   Received from [facility]   Overall Financial Resource Strain (CARDIA)    Difficulty of Paying Living Expenses: Not hard at all  Food Insecurity: No Food Insecurity (09/29/2023)   Received from [facility]   Hunger Vital Sign    Worried About Running Out of Food in the Last Year: Never true    Ran Out of Food in the Last Year: Never true  Transportation Needs: No Transportation Needs (09/29/2023)   Received from [facility] Transportation    In the past 12 months, has lack of transportation kept you from medical appointments or from getting medications?: No    Lack of Transportation (Non-Medical): No  Physical Activity: Not on file  Stress: Not on file  Social Connections: Not on file  Intimate Partner Violence: Not At Risk (02/19/2023)   Humiliation, Afraid, Rape, and Kick questionnaire    Fear of Current or Ex-Partner: No    Emotionally Abused: No    Physically Abused: No    Sexually Abused: No    FAMILY HISTORY: Family History  Problem Relation Age of Onset   Alzheimer's disease Mother    Colon polyps Mother    COPD Father    Heart disease Father    Diabetes Father    Alcohol abuse Brother    Cancer Maternal Grandmother        intestinal cancer    ALLERGIES:  is allergic to aspirin, ciprofloxacin, etanercept, metformin, methotrexate, mushroom extract complex (obsolete), other, darvon  [propoxyphene], minocycline, and cobalamin combinations.  MEDICATIONS:  Current Outpatient Medications  Medication Sig Dispense Refill   albuterol (PROVENTIL HFA;VENTOLIN HFA) 108 (90 Base) MCG/ACT inhaler Inhale 2 puffs into the lungs every 6 (six) hours as needed.     amLODipine (NORVASC) 10 MG tablet Take 10 mg by mouth daily.      busPIRone (BUSPAR) 7.5 MG tablet Take 7.5 mg by mouth 2 (two) times daily.     carvedilol (COREG) 6.25 MG tablet Take 6.25 mg by mouth 2 (two) times daily with a meal.     FLUoxetine (PROZAC) 40 MG capsule Take 40 mg by mouth daily.      furosemide (LASIX) 40 MG tablet Take 40 mg by mouth as needed.     hydroxychloroquine (PLAQUENIL) 200 MG tablet Take 400  mg by mouth daily.     insulin NPH Human (HUMULIN N,NOVOLIN N) 100 UNIT/ML injection 90 units in the am and 36 units at supper     insulin regular (NOVOLIN R) 100 units/mL injection Inject into the skin every morning. Per sliding scale     isosorbide mononitrate (IMDUR) 30 MG 24 hr tablet Take 30 mg by mouth daily.     losartan (COZAAR) 100 MG tablet TAKE 1 TABLET (100 MG TOTAL) BY MOUTH ONCE DAILY.     lovastatin (MEVACOR) 10 MG tablet Take 10 mg by mouth at bedtime.     MOUNJARO 2.5 MG/0.5ML Pen Inject into the skin.     omeprazole (PRILOSEC) 40 MG capsule Take 40 mg by mouth daily.     tiZANidine (ZANAFLEX) 4 MG tablet Take 1 tablet (4 mg total) by mouth 2 (two) times daily as needed for muscle spasms. 60 tablet 2   nitroGLYCERIN (NITROSTAT) 0.3 MG SL tablet 1 tablet sublingual prn for ESOPHAGEAL SPASMS(may repeat every 5 min, seek med help if pain persists after 3 tablets) (Patient not taking: Reported on 03/10/2024)     No current facility-administered medications for this visit.    Review of Systems  Constitutional:  Negative for appetite change, chills, fatigue and fever.  HENT:   Negative for hearing loss and voice change.   Eyes:  Negative for eye problems.  Respiratory:  Negative for chest  tightness and cough.   Cardiovascular:  Negative for chest pain.  Gastrointestinal:  Negative for abdominal distention, abdominal pain and blood in stool.  Endocrine: Negative for hot flashes.  Genitourinary:  Negative for difficulty urinating and frequency.   Musculoskeletal:  Negative for arthralgias.  Skin:  Negative for itching and rash.  Neurological:  Negative for extremity weakness.  Hematological:  Negative for adenopathy. Bruises/bleeds easily.  Psychiatric/Behavioral:  Negative for confusion.    PHYSICAL EXAMINATION: ECOG PERFORMANCE STATUS: 1 - Symptomatic but completely ambulatory Vitals:   03/10/24 0949 03/10/24 0958  BP: (!) 86/49 (!) 110/55  Pulse: 66   Resp: 16   Temp: 97.6 F (36.4 C)   SpO2: 95%    Filed Weights   03/10/24 0949  Weight: 196 lb (88.9 kg)    Physical Exam Constitutional:      General: She is not in acute distress. HENT:     Head: Normocephalic and atraumatic.  Eyes:     General: No scleral icterus. Cardiovascular:     Rate and Rhythm: Normal rate and regular rhythm.     Heart sounds: Normal heart sounds.  Pulmonary:     Effort: Pulmonary effort is normal. No respiratory distress.     Breath sounds: No wheezing.  Abdominal:     General: Bowel sounds are normal.     Palpations: Abdomen is soft.  Musculoskeletal:        General: No deformity. Normal range of motion.     Cervical back: Normal range of motion and neck supple.  Skin:    General: Skin is warm and dry.     Findings: Bruising present. No rash.  Neurological:     Mental Status: She is alert and oriented to person, place, and time. Mental status is at baseline.  Psychiatric:        Mood and Affect: Mood normal.     LABORATORY DATA:  I have reviewed the data as listed    Latest Ref Rng & Units 03/10/2024    9:42 AM 09/03/2023    1:17 PM 02/19/2023  10:05 AM  CBC  WBC 4.0 - 10.5 K/uL 8.5  7.2  6.0   Hemoglobin 12.0 - 15.0 g/dL 16.1  09.6  04.5   Hematocrit 36.0 -  46.0 % 36.4  40.0  38.7   Platelets 150 - 400 K/uL 120  101  107       Latest Ref Rng & Units 03/10/2024    9:42 AM 09/03/2023    1:17 PM 02/19/2023   10:05 AM  CMP  Glucose 70 - 99 mg/dL 409  811  914   BUN 8 - 23 mg/dL 34  14  24   Creatinine 0.44 - 1.00 mg/dL 7.82  9.56  2.13   Sodium 135 - 145 mmol/L 133  134  135   Potassium 3.5 - 5.1 mmol/L 3.8  3.9  3.5   Chloride 98 - 111 mmol/L 103  103  102   CO2 22 - 32 mmol/L 24  25  25    Calcium 8.9 - 10.3 mg/dL 8.3  8.7  8.8   Total Protein 6.5 - 8.1 g/dL 7.2  8.1  8.2   Total Bilirubin 0.0 - 1.2 mg/dL 0.8  1.0  1.0   Alkaline Phos 38 - 126 U/L 143  122  140   AST 15 - 41 U/L 30  33  43   ALT 0 - 44 U/L 27  21  37       RADIOGRAPHIC STUDIES: I have personally reviewed the radiological images as listed and agreed with the findings in the report. No results found.

## 2024-08-18 NOTE — Progress Notes (Signed)
 The patient returns to clinic after undergoing an anterior cervical diskectomy and fusion with plating at C3-4 on 07/04/24.  Since that time the patient has done quite well.  The pain is improved with some tightness on right side only and their muscle strength is back to normal.  The x-rays today looked quite good with the plate bone graft and screws being in good position.  The incision is well-healed. We plan on starting the patient on neck exercises, physical therapy and discharging the patient from our clinic at this time. They will contact our office if they have any further problems.

## 2024-09-08 ENCOUNTER — Inpatient Hospital Stay: Attending: Oncology

## 2024-09-08 ENCOUNTER — Inpatient Hospital Stay

## 2024-09-08 ENCOUNTER — Encounter: Payer: Self-pay | Admitting: Oncology

## 2024-09-08 ENCOUNTER — Inpatient Hospital Stay: Admitting: Oncology

## 2024-09-08 VITALS — BP 124/99 | HR 63 | Temp 97.6°F | Resp 18 | Wt 198.2 lb

## 2024-09-08 DIAGNOSIS — N1831 Chronic kidney disease, stage 3a: Secondary | ICD-10-CM | POA: Insufficient documentation

## 2024-09-08 DIAGNOSIS — D696 Thrombocytopenia, unspecified: Secondary | ICD-10-CM | POA: Insufficient documentation

## 2024-09-08 DIAGNOSIS — Z79899 Other long term (current) drug therapy: Secondary | ICD-10-CM | POA: Diagnosis not present

## 2024-09-08 DIAGNOSIS — Z87891 Personal history of nicotine dependence: Secondary | ICD-10-CM | POA: Diagnosis not present

## 2024-09-08 DIAGNOSIS — K7469 Other cirrhosis of liver: Secondary | ICD-10-CM

## 2024-09-08 DIAGNOSIS — Z809 Family history of malignant neoplasm, unspecified: Secondary | ICD-10-CM | POA: Insufficient documentation

## 2024-09-08 DIAGNOSIS — K7460 Unspecified cirrhosis of liver: Secondary | ICD-10-CM | POA: Insufficient documentation

## 2024-09-08 LAB — CBC WITH DIFFERENTIAL (CANCER CENTER ONLY)
Abs Immature Granulocytes: 0.02 K/uL (ref 0.00–0.07)
Basophils Absolute: 0 K/uL (ref 0.0–0.1)
Basophils Relative: 1 %
Eosinophils Absolute: 0.2 K/uL (ref 0.0–0.5)
Eosinophils Relative: 3 %
HCT: 34.7 % — ABNORMAL LOW (ref 36.0–46.0)
Hemoglobin: 11.9 g/dL — ABNORMAL LOW (ref 12.0–15.0)
Immature Granulocytes: 0 %
Lymphocytes Relative: 21 %
Lymphs Abs: 1 K/uL (ref 0.7–4.0)
MCH: 27.6 pg (ref 26.0–34.0)
MCHC: 34.3 g/dL (ref 30.0–36.0)
MCV: 80.5 fL (ref 80.0–100.0)
Monocytes Absolute: 0.3 K/uL (ref 0.1–1.0)
Monocytes Relative: 5 %
Neutro Abs: 3.3 K/uL (ref 1.7–7.7)
Neutrophils Relative %: 70 %
Platelet Count: 87 K/uL — ABNORMAL LOW (ref 150–400)
RBC: 4.31 MIL/uL (ref 3.87–5.11)
RDW: 13.2 % (ref 11.5–15.5)
WBC Count: 4.8 K/uL (ref 4.0–10.5)
nRBC: 0 % (ref 0.0–0.2)

## 2024-09-08 LAB — IMMATURE PLATELET FRACTION: Immature Platelet Fraction: 3 % (ref 1.2–8.6)

## 2024-09-08 LAB — VITAMIN B12: Vitamin B-12: 531 pg/mL (ref 180–914)

## 2024-09-08 LAB — FOLATE: Folate: 7.7 ng/mL (ref >5.9)

## 2024-09-08 NOTE — Progress Notes (Signed)
 Hematology/Oncology Progress note [phone] [phone]           REFERRING PROVIDER: Zachary Idelia LABOR, MD   CHIEF COMPLAINTS/REASON FOR VISIT:  Thrombocytopenia   ASSESSMENT & PLAN:   Thrombocytopenia Acute on chronic thrombocytopenia.  Likely secondary to splenomegaly/cirrhosis Possible autoimmune etiology as well.   Previous workup showed adequate B12 and folate level.  Negative M protein on SPEP.  Increase of both kappa and lambda light chain with only mildly increased light chain ratio.  Nonspecific.  Negative peripheral flow cytometry. Today's labs were reviewed and discussed with patient.  Platelet count has decreased to 87,000 Likely due to splenomegaly/cirrhosis.  Check B12, folate Recommend observation.  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      Orders Placed This Encounter  Procedures   CBC with Differential (Cancer Center Only)    Standing Status:   Future    Expected Date:   03/09/2025    Expiration Date:   06/07/2025   Immature Platelet Fraction    Standing Status:   Future    Expected Date:   03/09/2025    Expiration Date:   06/07/2025   Vitamin B12    Standing Status:   Future    Number of Occurrences:   1    Expected Date:   09/08/2024    Expiration Date:   12/07/2024   Folate    Standing Status:   Future    Number of Occurrences:   1    Expected Date:   09/08/2024    Expiration Date:   12/07/2024   Immature Platelet Fraction    Standing Status:   Future    Number of Occurrences:   1    Expected Date:   09/08/2024    Expiration Date:   12/07/2024   Follow-up 6 months  all questions were answered. The patient knows to call the clinic with any problems, questions or concerns.  Zelphia Cap, MD, PhD [facility] Hematology Oncology 09/08/2024   HISTORY OF  PRESENTING ILLNESS:   Amber Acosta is a  73 y.o.  female with PMH listed below was seen in consultation at the request of  Zachary Idelia LABOR, MD  for evaluation of thrombocytopenia  Patient has a history chronic thrombocytopenia.  Patient was previously seen by hematologist at [facility], last seen in 2014.  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 Patient has a history of liver cirrhosis, sarcoidosis, splenomegaly.  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  Currently not on any active treatment for psoriasis.  She follows up with rheumatology, hepatology  Recent blood work on 02/12/2023 shows a platelet count of 112,000.  Patient was referred to establish care with hematology for further evaluation. Patient reports feeling well today at her usual state of health.  She denies unintentional weight loss, night sweats, fever.  Appetite is good.  Reports easy bruising, upper arms and the sites of insulin injection.  INTERVAL HISTORY Amber Acosta is a 73 y.o. female who has above history reviewed by me today presents for follow up visit for thrombocytopenia. She reports easy bruising. No active bleeding events. She follows up with hepatologist at [facility]. She get AFP and US  liver surveillance.   S/p Cervical Discectomy and Fusion in August 2025.    MEDICAL  HISTORY:  Past Medical History:  Diagnosis Date   Arthritis    Cervical spondylosis without myelopathy 09/21/2015   Chronic pain    Chronic pain associated with significant psychosocial dysfunction 08/28/2013   Cirrhosis (HCC)    Depression    Diabetes mellitus without complication (HCC)    Displacement of cervical intervertebral disc without myelopathy 09/05/2015   Fibromyalgia    Gallstones    GERD (gastroesophageal reflux disease)    Hypertension    Kidney disease, chronic, stage III (GFR 30-59 ml/min) (HCC)     Sarcoid    Splenomegaly    Thrombopenia     SURGICAL HISTORY: Past Surgical History:  Procedure Laterality Date   ABDOMINAL HYSTERECTOMY     BACK SURGERY     BREAST SURGERY     CYST EXCISION     Middle of back 9/19   FRACTURE SURGERY     nose   HAND SURGERY     KNEE ARTHROSCOPY     right   neck fusion      SOCIAL HISTORY: Social History   Socioeconomic History   Marital status: Married    Spouse name: Not on file   Number of children: Not on file   Years of education: Not on file   Highest education level: Not on file  Occupational History   Not on file  Tobacco Use   Smoking status: Former    Current packs/day: 0.00    Average packs/day: 3.0 packs/day for 7.0 years (21.0 ttl pk-yrs)    Types: Cigarettes    Start date: 20    Quit date: 20    Years since quitting: 39.7   Smokeless tobacco: Never  Vaping Use   Vaping status: Never Used  Substance and Sexual Activity   Alcohol use: Not Currently    Comment: not anymore   Drug use: No   Sexual activity: Not on file  Other Topics Concern   Not on file  Social History Narrative   Not on file   Social Drivers of Health   Financial Resource Strain: Low Risk  (06/10/2024)   Received from [facility]   Overall Financial Resource Strain (CARDIA)    Difficulty of Paying Living Expenses: Not hard at all  Food Insecurity: No Food Insecurity (06/10/2024)   Received from [facility]   Hunger Vital Sign    Within the past 12 months, you worried that your food would run out before you got the money to buy more.: Never true    Within the past 12 months, the food you bought just didn't last and you didn't have money to get more.: Never true  Transportation Needs: No Transportation Needs (06/10/2024)   Received from [facility] - Transportation    In the past 12 months, has lack of transportation kept you from medical appointments or from getting  medications?: No    Lack of Transportation (Non-Medical): No  Physical Activity: Not on file  Stress: Not on file  Social Connections: Not on file  Intimate Partner Violence: Not At Risk (02/19/2023)   Humiliation, Afraid, Rape, and Kick questionnaire    Fear of Current or Ex-Partner: No    Emotionally Abused: No    Physically Abused: No    Sexually Abused: No    FAMILY HISTORY: Family History  Problem Relation Age of Onset   Alzheimer's disease Mother    Colon polyps Mother    COPD Father  Heart disease Father    Diabetes Father    Alcohol abuse Brother    Cancer Maternal Grandmother        intestinal cancer    ALLERGIES:  is allergic to aspirin, ciprofloxacin, etanercept, metformin, methotrexate, mushroom extract complex (obsolete), other, darvon [propoxyphene], minocycline, and cobalamin combinations.  MEDICATIONS:  Current Outpatient Medications  Medication Sig Dispense Refill   albuterol (PROVENTIL HFA;VENTOLIN HFA) 108 (90 Base) MCG/ACT inhaler Inhale 2 puffs into the lungs every 6 (six) hours as needed.     amLODipine (NORVASC) 10 MG tablet Take 10 mg by mouth daily.      busPIRone (BUSPAR) 7.5 MG tablet Take 7.5 mg by mouth 2 (two) times daily.     carvedilol (COREG) 6.25 MG tablet Take 6.25 mg by mouth 2 (two) times daily with a meal.     FLUoxetine (PROZAC) 40 MG capsule Take 40 mg by mouth daily.      insulin glargine (LANTUS SOLOSTAR) 100 UNIT/ML Solostar Pen Inject 22 Units into the skin at bedtime.     insulin regular (NOVOLIN R) 100 units/mL injection Inject into the skin every morning. Per sliding scale (Patient taking differently: Inject into the skin every morning. Per sliding scale)     isosorbide mononitrate (IMDUR) 30 MG 24 hr tablet Take 30 mg by mouth daily.     losartan (COZAAR) 100 MG tablet TAKE 1 TABLET (100 MG TOTAL) BY MOUTH ONCE DAILY.     lovastatin (MEVACOR) 10 MG tablet Take 10 mg by mouth at bedtime.     methocarbamol (ROBAXIN) 750 MG tablet  Take 750 mg by mouth 3 (three) times daily as needed.     MOUNJARO 7.5 MG/0.5ML Pen Inject 7.5 mg into the skin once a week.     omeprazole (PRILOSEC) 40 MG capsule Take 40 mg by mouth daily.     tiZANidine  (ZANAFLEX ) 4 MG tablet Take 1 tablet (4 mg total) by mouth 2 (two) times daily as needed for muscle spasms. 60 tablet 2   insulin NPH Human (HUMULIN N,NOVOLIN N) 100 UNIT/ML injection 90 units in the am and 36 units at supper (Patient not taking: Reported on 09/08/2024)     nitroGLYCERIN (NITROSTAT) 0.3 MG SL tablet 1 tablet sublingual prn for ESOPHAGEAL SPASMS(may repeat every 5 min, seek med help if pain persists after 3 tablets) (Patient not taking: Reported on 09/08/2024)     No current facility-administered medications for this visit.    Review of Systems  Constitutional:  Negative for appetite change, chills, fatigue and fever.  HENT:   Negative for hearing loss and voice change.   Eyes:  Negative for eye problems.  Respiratory:  Negative for chest tightness and cough.   Cardiovascular:  Negative for chest pain.  Gastrointestinal:  Negative for abdominal distention, abdominal pain and blood in stool.  Endocrine: Negative for hot flashes.  Genitourinary:  Negative for difficulty urinating and frequency.   Musculoskeletal:  Negative for arthralgias.  Skin:  Negative for itching and rash.  Neurological:  Negative for extremity weakness.  Hematological:  Negative for adenopathy. Bruises/bleeds easily.  Psychiatric/Behavioral:  Negative for confusion.    PHYSICAL EXAMINATION: ECOG PERFORMANCE STATUS: 1 - Symptomatic but completely ambulatory Vitals:   09/08/24 1008  BP: (!) 124/99  Pulse: 63  Resp: 18  Temp: 97.6 F (36.4 C)   Filed Weights   09/08/24 1008  Weight: 198 lb 3.2 oz (89.9 kg)    Physical Exam Constitutional:      General: She  is not in acute distress. HENT:     Head: Normocephalic and atraumatic.  Eyes:     General: No scleral icterus. Cardiovascular:      Rate and Rhythm: Normal rate and regular rhythm.     Heart sounds: Normal heart sounds.  Pulmonary:     Effort: Pulmonary effort is normal. No respiratory distress.     Breath sounds: No wheezing.  Abdominal:     General: Bowel sounds are normal.     Palpations: Abdomen is soft.  Musculoskeletal:        General: No deformity. Normal range of motion.     Cervical back: Normal range of motion and neck supple.  Skin:    General: Skin is warm and dry.     Findings: Bruising present. No rash.  Neurological:     Mental Status: She is alert and oriented to person, place, and time. Mental status is at baseline.  Psychiatric:        Mood and Affect: Mood normal.     LABORATORY DATA:  I have reviewed the data as listed    Latest Ref Rng & Units 09/08/2024    9:52 AM 03/10/2024    9:42 AM 09/03/2023    1:17 PM  CBC  WBC 4.0 - 10.5 K/uL 4.8  8.5  7.2   Hemoglobin 12.0 - 15.0 g/dL 88.0  87.3  86.2   Hematocrit 36.0 - 46.0 % 34.7  36.4  40.0   Platelets 150 - 400 K/uL 87  120  101       Latest Ref Rng & Units 03/10/2024    9:42 AM 09/03/2023    1:17 PM 02/19/2023   10:05 AM  CMP  Glucose 70 - 99 mg/dL 802  876  873   BUN 8 - 23 mg/dL 34  14  24   Creatinine 0.44 - 1.00 mg/dL 8.77  8.82  8.77   Sodium 135 - 145 mmol/L 133  134  135   Potassium 3.5 - 5.1 mmol/L 3.8  3.9  3.5   Chloride 98 - 111 mmol/L 103  103  102   CO2 22 - 32 mmol/L 24  25  25    Calcium 8.9 - 10.3 mg/dL 8.3  8.7  8.8   Total Protein 6.5 - 8.1 g/dL 7.2  8.1  8.2   Total Bilirubin 0.0 - 1.2 mg/dL 0.8  1.0  1.0   Alkaline Phos 38 - 126 U/L 143  122  140   AST 15 - 41 U/L 30  33  43   ALT 0 - 44 U/L 27  21  37       RADIOGRAPHIC STUDIES: I have personally reviewed the radiological images as listed and agreed with the findings in the report. No results found.

## 2024-09-08 NOTE — Assessment & Plan Note (Signed)
 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

## 2024-09-08 NOTE — Assessment & Plan Note (Signed)
 Encourage oral hydration and avoid nephrotoxins.

## 2024-09-08 NOTE — Assessment & Plan Note (Addendum)
 Acute on chronic thrombocytopenia.  Likely secondary to splenomegaly/cirrhosis Possible autoimmune etiology as well.   Previous workup showed adequate B12 and folate level.  Negative M protein on SPEP.  Increase of both kappa and lambda light chain with only mildly increased light chain ratio.  Nonspecific.  Negative peripheral flow cytometry. Today's labs were reviewed and discussed with patient.  Platelet count has decreased to 87,000 Likely due to splenomegaly/cirrhosis.  Check B12, folate Recommend observation.

## 2025-03-16 ENCOUNTER — Ambulatory Visit: Admitting: Oncology

## 2025-03-16 ENCOUNTER — Other Ambulatory Visit
